# Patient Record
Sex: Male | Born: 1950 | Race: White | Hispanic: No | Marital: Married | Smoking: Never smoker
Health system: Southern US, Community
[De-identification: ages and names within clinical notes are randomized; demographics above are authoritative.]

## PROBLEM LIST (undated history)

## (undated) VITALS — BP 115/76 | HR 98 | Temp 98.1°F | Resp 16 | Ht 68.1 in | Wt 196.0 lb

## (undated) DIAGNOSIS — F329 Major depressive disorder, single episode, unspecified: Secondary | ICD-10-CM

## (undated) DIAGNOSIS — J329 Chronic sinusitis, unspecified: Secondary | ICD-10-CM

## (undated) DIAGNOSIS — C61 Malignant neoplasm of prostate: Secondary | ICD-10-CM

## (undated) DIAGNOSIS — Z87442 Personal history of urinary calculi: Secondary | ICD-10-CM

## (undated) DIAGNOSIS — R351 Nocturia: Secondary | ICD-10-CM

## (undated) DIAGNOSIS — M1990 Unspecified osteoarthritis, unspecified site: Secondary | ICD-10-CM

## (undated) DIAGNOSIS — I10 Essential (primary) hypertension: Secondary | ICD-10-CM

## (undated) DIAGNOSIS — Z860100 Personal history of colon polyps, unspecified: Secondary | ICD-10-CM

## (undated) DIAGNOSIS — K449 Diaphragmatic hernia without obstruction or gangrene: Secondary | ICD-10-CM

## (undated) DIAGNOSIS — K219 Gastro-esophageal reflux disease without esophagitis: Secondary | ICD-10-CM

## (undated) DIAGNOSIS — J84112 Idiopathic pulmonary fibrosis: Secondary | ICD-10-CM

## (undated) DIAGNOSIS — K649 Unspecified hemorrhoids: Secondary | ICD-10-CM

## (undated) DIAGNOSIS — R059 Cough, unspecified: Secondary | ICD-10-CM

## (undated) DIAGNOSIS — R3129 Other microscopic hematuria: Secondary | ICD-10-CM

## (undated) DIAGNOSIS — K5730 Diverticulosis of large intestine without perforation or abscess without bleeding: Secondary | ICD-10-CM

## (undated) DIAGNOSIS — F411 Generalized anxiety disorder: Secondary | ICD-10-CM

## (undated) DIAGNOSIS — R05 Cough: Secondary | ICD-10-CM

## (undated) DIAGNOSIS — F410 Panic disorder [episodic paroxysmal anxiety] without agoraphobia: Secondary | ICD-10-CM

## (undated) DIAGNOSIS — K5790 Diverticulosis of intestine, part unspecified, without perforation or abscess without bleeding: Secondary | ICD-10-CM

## (undated) HISTORY — DX: Essential (primary) hypertension: I10

## (undated) HISTORY — PX: COLONOSCOPY: SHX174

## (undated) HISTORY — DX: Other microscopic hematuria: R31.29

## (undated) HISTORY — PX: EXTRACORPOREAL SHOCK WAVE LITHOTRIPSY: SHX1557

## (undated) HISTORY — DX: Diaphragmatic hernia without obstruction or gangrene: K44.9

## (undated) HISTORY — DX: Idiopathic pulmonary fibrosis: J84.112

## (undated) HISTORY — PX: ESOPHAGOGASTRODUODENOSCOPY: SHX1529

## (undated) HISTORY — PX: PROSTATE BIOPSY: SHX241

## (undated) HISTORY — PX: TONSILLECTOMY AND ADENOIDECTOMY: SUR1326

## (undated) HISTORY — DX: Unspecified osteoarthritis, unspecified site: M19.90

## (undated) HISTORY — DX: Diverticulosis of intestine, part unspecified, without perforation or abscess without bleeding: K57.90

---

## 1997-12-22 ENCOUNTER — Emergency Department (HOSPITAL_COMMUNITY): Admission: EM | Admit: 1997-12-22 | Discharge: 1997-12-22 | Payer: Self-pay | Admitting: Emergency Medicine

## 1997-12-22 ENCOUNTER — Encounter: Payer: Self-pay | Admitting: Emergency Medicine

## 2000-07-02 ENCOUNTER — Ambulatory Visit (HOSPITAL_COMMUNITY): Admission: RE | Admit: 2000-07-02 | Discharge: 2000-07-02 | Payer: Self-pay | Admitting: Urology

## 2000-07-02 ENCOUNTER — Encounter: Payer: Self-pay | Admitting: Urology

## 2007-06-08 ENCOUNTER — Ambulatory Visit: Payer: Self-pay | Admitting: Internal Medicine

## 2007-06-18 ENCOUNTER — Ambulatory Visit: Payer: Self-pay | Admitting: Internal Medicine

## 2007-06-18 ENCOUNTER — Encounter: Payer: Self-pay | Admitting: Internal Medicine

## 2011-01-10 ENCOUNTER — Other Ambulatory Visit (HOSPITAL_COMMUNITY): Payer: Self-pay | Admitting: Urology

## 2011-01-10 DIAGNOSIS — R52 Pain, unspecified: Secondary | ICD-10-CM

## 2011-01-24 ENCOUNTER — Other Ambulatory Visit (HOSPITAL_COMMUNITY): Payer: Self-pay | Admitting: Urology

## 2011-01-24 ENCOUNTER — Ambulatory Visit (HOSPITAL_COMMUNITY)
Admission: RE | Admit: 2011-01-24 | Discharge: 2011-01-24 | Disposition: A | Discharge status: Home / Self Care | Payer: BC Managed Care – PPO | Source: Ambulatory Visit | Attending: Urology | Admitting: Urology

## 2011-01-24 DIAGNOSIS — N50812 Left testicular pain: Secondary | ICD-10-CM

## 2011-01-24 DIAGNOSIS — R109 Unspecified abdominal pain: Secondary | ICD-10-CM | POA: Insufficient documentation

## 2011-01-24 DIAGNOSIS — I861 Scrotal varices: Secondary | ICD-10-CM | POA: Insufficient documentation

## 2011-01-24 DIAGNOSIS — R52 Pain, unspecified: Secondary | ICD-10-CM

## 2011-02-27 ENCOUNTER — Other Ambulatory Visit: Payer: Self-pay | Admitting: Otolaryngology

## 2011-02-28 ENCOUNTER — Ambulatory Visit
Admission: RE | Admit: 2011-02-28 | Discharge: 2011-02-28 | Disposition: A | Discharge status: Home / Self Care | Payer: BC Managed Care – PPO | Source: Ambulatory Visit | Attending: Otolaryngology | Admitting: Otolaryngology

## 2011-03-07 ENCOUNTER — Other Ambulatory Visit: Payer: Self-pay | Admitting: Otolaryngology

## 2011-03-07 DIAGNOSIS — H905 Unspecified sensorineural hearing loss: Secondary | ICD-10-CM

## 2011-03-07 DIAGNOSIS — H9312 Tinnitus, left ear: Secondary | ICD-10-CM

## 2011-03-08 ENCOUNTER — Inpatient Hospital Stay: Admission: RE | Admit: 2011-03-08 | Payer: BC Managed Care – PPO | Source: Ambulatory Visit

## 2011-03-09 ENCOUNTER — Ambulatory Visit
Admission: RE | Admit: 2011-03-09 | Discharge: 2011-03-09 | Disposition: A | Discharge status: Home / Self Care | Payer: BC Managed Care – PPO | Source: Ambulatory Visit | Attending: Otolaryngology | Admitting: Otolaryngology

## 2011-03-09 DIAGNOSIS — H905 Unspecified sensorineural hearing loss: Secondary | ICD-10-CM

## 2011-03-09 DIAGNOSIS — H9312 Tinnitus, left ear: Secondary | ICD-10-CM

## 2011-03-09 MED ORDER — GADOBENATE DIMEGLUMINE 529 MG/ML IV SOLN
17.0000 mL | Freq: Once | INTRAVENOUS | Status: AC | PRN
  Administered 2011-03-09: 17 mL via INTRAVENOUS

## 2011-04-20 ENCOUNTER — Encounter (HOSPITAL_BASED_OUTPATIENT_CLINIC_OR_DEPARTMENT_OTHER): Payer: Self-pay | Admitting: Emergency Medicine

## 2011-04-20 ENCOUNTER — Emergency Department (HOSPITAL_BASED_OUTPATIENT_CLINIC_OR_DEPARTMENT_OTHER)
Admission: EM | Admit: 2011-04-20 | Discharge: 2011-04-20 | Disposition: A | Discharge status: Home / Self Care | Payer: BC Managed Care – PPO | Attending: Emergency Medicine | Admitting: Emergency Medicine

## 2011-04-20 DIAGNOSIS — J329 Chronic sinusitis, unspecified: Secondary | ICD-10-CM

## 2011-04-20 DIAGNOSIS — J3489 Other specified disorders of nose and nasal sinuses: Secondary | ICD-10-CM | POA: Insufficient documentation

## 2011-04-20 MED ORDER — FLUTICASONE PROPIONATE 50 MCG/ACT NA SUSP: 2.0000 | Freq: Every day | NASAL | Status: DC

## 2011-04-20 NOTE — ED Provider Notes (Signed)
History     CSN: [id number]  Arrival date & time 04/20/11  1243   First MD Initiated Contact with Patient 04/20/11 1356      Chief Complaint  Patient presents with  . Nasal Congestion    (Consider location/radiation/quality/duration/timing/severity/associated sxs/prior treatment) HPI Comments: Pt states that he has been on 6 different antibiotics from sinusitis by his ent over the last couple of weeks  Patient is a 61 y.o. male presenting with sinusitis. The history is provided by the patient. No language interpreter was used.  Sinusitis  This is a recurrent problem. The current episode started more than 1 week ago. The problem has not changed since onset.There has been no fever. The pain is moderate. The pain has been constant since onset. Associated symptoms include congestion and cough.    Past Medical History  Diagnosis Date  . Tinnitus   . Depression   . Anxiety     History reviewed. No pertinent past surgical history.  No family history on file.  History  Substance Use Topics  . Smoking status: Never Smoker   . Smokeless tobacco: Not on file  . Alcohol Use: No      Review of Systems  HENT: Positive for congestion.   Respiratory: Positive for cough.   All other systems reviewed and are negative.    Allergies  Review of patient's allergies indicates no known allergies.  Home Medications   Current Outpatient Rx  Name Route Sig Dispense Refill  . ALPRAZOLAM 0.5 MG PO TABS Oral Take 0.5 mg by mouth 2 (two) times daily.    Marland Kitchen DOXYCYCLINE HYCLATE PO Oral Take by mouth.    Marland Kitchen HYDROCHLOROTHIAZIDE PO Oral Take by mouth.    Marland Kitchen LOSARTAN POTASSIUM PO Oral Take by mouth.    Marland Kitchen PAXIL PO Oral Take by mouth.    Marland Kitchen PROBIOTIC FORMULA PO Oral Take by mouth.    Marland Kitchen FLUTICASONE PROPIONATE 50 MCG/ACT NA SUSP Nasal Place 2 sprays into the nose daily. 16 g 0    BP 108/72  Pulse 86  Temp(Src) 98 F (36.7 C) (Oral)  Resp 20  Ht 5\' 9"  (1.753 m)  Wt 190 lb (86.183 kg)  BMI  28.06 kg/m2  SpO2 100%  Physical Exam  Nursing note and vitals reviewed. Constitutional: He is oriented to person, place, and time. He appears well-developed and well-nourished.  HENT:  Head: Normocephalic and atraumatic.  Right Ear: External ear normal.  Left Ear: External ear normal.  Nose: Mucosal edema and rhinorrhea present.  Eyes: EOM are normal.  Neck: Neck supple.  Cardiovascular: Normal rate and regular rhythm.   Pulmonary/Chest: Effort normal and breath sounds normal.  Abdominal: Soft. Bowel sounds are normal.  Musculoskeletal: Normal range of motion.  Neurological: He is alert and oriented to person, place, and time.  Skin: Skin is warm and dry.    ED Course  Procedures (including critical care time)  Labs Reviewed - No data to display No results found.   1. Sinusitis       MDM  Pt okay to continue doxy;will place on flonase        Teressa Lower, NP 04/21/11 0036

## 2011-04-20 NOTE — ED Notes (Signed)
Pt sts he has taken "5-6 different antibiotics" over the last 2 mos for sinus infections; sees ENT for same; not getting better

## 2011-04-20 NOTE — ED Notes (Signed)
MD at bedside. Vrinda, PA 

## 2011-04-20 NOTE — Discharge Instructions (Signed)

## 2011-04-21 NOTE — ED Provider Notes (Signed)
Medical screening examination/treatment/procedure(s) were performed by non-physician practitioner and as supervising physician I was immediately available for consultation/collaboration.  Ethelda Chick, MD 04/21/11 1125

## 2011-05-09 ENCOUNTER — Encounter: Payer: Self-pay | Admitting: Nurse Practitioner

## 2011-05-09 ENCOUNTER — Telehealth: Payer: Self-pay | Admitting: Internal Medicine

## 2011-05-09 ENCOUNTER — Other Ambulatory Visit (INDEPENDENT_AMBULATORY_CARE_PROVIDER_SITE_OTHER): Payer: BC Managed Care – PPO

## 2011-05-09 ENCOUNTER — Ambulatory Visit (INDEPENDENT_AMBULATORY_CARE_PROVIDER_SITE_OTHER): Payer: BC Managed Care – PPO | Admitting: Nurse Practitioner

## 2011-05-09 VITALS — BP 86/60 | HR 98 | Ht 69.0 in | Wt 191.8 lb

## 2011-05-09 DIAGNOSIS — R12 Heartburn: Secondary | ICD-10-CM

## 2011-05-09 DIAGNOSIS — R42 Dizziness and giddiness: Secondary | ICD-10-CM

## 2011-05-09 DIAGNOSIS — R110 Nausea: Secondary | ICD-10-CM

## 2011-05-09 DIAGNOSIS — F411 Generalized anxiety disorder: Secondary | ICD-10-CM

## 2011-05-09 DIAGNOSIS — F419 Anxiety disorder, unspecified: Secondary | ICD-10-CM

## 2011-05-09 LAB — COMPREHENSIVE METABOLIC PANEL
AST: 22 U/L (ref 0–37)
Albumin: 4 g/dL (ref 3.5–5.2)
BUN: 16 mg/dL (ref 6–23)
Calcium: 9.4 mg/dL (ref 8.4–10.5)
Chloride: 96 mEq/L (ref 96–112)
Creatinine, Ser: 1.3 mg/dL (ref 0.4–1.5)
GFR: 61.75 mL/min (ref >60.00)
Glucose, Bld: 90 mg/dL (ref 70–99)
Potassium: 4.1 mEq/L (ref 3.5–5.1)

## 2011-05-09 LAB — CBC WITH DIFFERENTIAL/PLATELET
Basophils Relative: 0.7 % (ref 0.0–3.0)
Eosinophils Absolute: 0.1 10*3/uL (ref 0.0–0.7)
Eosinophils Relative: 0.9 % (ref 0.0–5.0)
Hemoglobin: 17.2 g/dL — ABNORMAL HIGH (ref 13.0–17.0)
Lymphocytes Relative: 22.7 % (ref 12.0–46.0)
MCHC: 34.5 g/dL (ref 30.0–36.0)
MCV: 96 fl (ref 78.0–100.0)
Neutro Abs: 4.9 10*3/uL (ref 1.4–7.7)
RBC: 5.19 Mil/uL (ref 4.22–5.81)
WBC: 7.2 10*3/uL (ref 4.5–10.5)

## 2011-05-09 MED ORDER — ESOMEPRAZOLE MAGNESIUM 40 MG PO CPDR: DELAYED_RELEASE_CAPSULE | ORAL | Status: DC

## 2011-05-09 NOTE — Progress Notes (Signed)
05/09/2011 Eduardo Fuentes [id number] 09/19/1950   HISTORY OF PRESENT ILLNESS: Patient had a screening colonoscopy in April 2009 with removal of an adenomatous polyp. He presents for evaluation of nausea which began about a month ago. The nausea is progressive and he is now having dry heaves. He doesn't actually vomit, the nausea is not just related to eating as he often wakes up with it. Patient takes Ibuprofen about 3 times a week. This week patient has had some burning in his chest which he thinks may be heartburn. He also complains of acid regurgitiation. No dysphagia. No significant involuntary weight loss.   Patient has been in process of changing anti-depressants. He was on Paxil for years but that was changed to Lexapro in early Feb. Lexapro was ineffective and caused headaches so he went back to Paxil. The dose was increased from 20mg  to 60mg  two weeks ago but patient wasn't feeling "right" so dose decreased dose to 40mg . He still feels depressed and anxious. In addition, patient was diagnosed with tinnitus in October. Since December ENT has been treating him with antibiotics for a sinus infection. He has been on 6-7 different antibiotics.  Patient saw ENT today,  told sinus infection was better, and antibiotics discontinued.   Patient complains of dizziness this week, mainly when rising from sitting position.   Past Medical History  Diagnosis Date  . Tinnitus   . Depression   . Anxiety    No past surgical history on file.  reports that he has never smoked. He has never used smokeless tobacco. He reports that he does not drink alcohol or use illicit drugs. family history includes Bone cancer in his father; Dementia in his mother; and Heart disease in his father. No Known Allergies    Outpatient Encounter Prescriptions as of 05/09/2011  Medication Sig Dispense Refill  . ALPRAZolam (XANAX) 0.5 MG tablet Take 0.5 mg by mouth 2 (two) times daily.      . fluticasone (FLONASE) 50 MCG/ACT  nasal spray Place 2 sprays into the nose daily.  16 g  0  . HYDROCHLOROTHIAZIDE PO Take by mouth.      Marland Kitchen LOSARTAN POTASSIUM PO Take by mouth.      Marland Kitchen PARoxetine HCl (PAXIL PO) Take by mouth.      . DOXYCYCLINE HYCLATE PO Take by mouth.      . Probiotic Product (PROBIOTIC FORMULA PO) Take by mouth.         REVIEW OF SYSTEMS  : All other systems reviewed and negative except where noted in the History of Present Illness.   PHYSICAL EXAM: BP 94/68  Pulse 89  Ht 5\' 9"  (1.753 m)  Wt 191 lb 12.8 oz (87 kg)  BMI 28.32 kg/m2  SpO2 97% General: Well developed white male in no acute distress Head: Normocephalic and atraumatic Eyes:  sclerae anicteric,conjunctive pink. Ears: Normal auditory acuity Mouth: No deformity or lesions Neck: Supple, no masses.  Lungs: Clear throughout to auscultation Heart: Regular rate and rhythm Abdomen: Soft, non distended, nontender. No masses or hepatomegaly noted. Normal Bowel sounds Rectal: not done Musculoskeletal: Symmetrical with no gross deformities  Skin: No lesions on visible extremities Extremities: No edema or deformities noted Neurological: Alert oriented, grossly nonfocal Cervical Nodes:  No significant cervical adenopathy Psychological:  Alert and cooperative, anxious.  ASSESSMENT AND PLAN; 1. Nausea, possibly secondary to changes in anti-depressant types / dosages and maybe antibiotics. PUD not excluded given NSAID use. Hold Ibuprofen for now. Trial of PPI. Will check  some basic labs today. Follow up in approximately three weeks. Further recommendations pending clinical course. May use the Phenergan (home med) for nausea as needed.    2. HTN, on medication. SBP lower than normal today per patient. His is having some dizziness this week, mainly when standing up. He is mildly orthostatic in the office.  Patient inquires about adding more salt in diet, I think it would be best to just hold his HCTZ for now and I did call PCP's office regarding this.  Advised patient to increase fluid intake and call PCP on Monday.    3. Anxiety / depression, his anti-depressants are currently being adjusted. Patient is quite anxious today  4. Probable GERD with heartburn, acid regurgitation. Will see how he does on daily PPI. Anti-reflux measures discussed.

## 2011-05-09 NOTE — Telephone Encounter (Signed)
Pt states that he is having nausea and gagging in the mornings, he does not throw anything up. Pt wonders if it may be acid reflux, he states he has been on a lot of antibiotics recently. Pt scheduled to see Willette Cluster NP today at 3:30pm. Pt aware of appt date and time.

## 2011-05-09 NOTE — Patient Instructions (Signed)
We have given you samples of Nexium. Take the Phenergan for nausea. Do not take the Hydrochlorothiazide for now.

## 2011-05-10 ENCOUNTER — Encounter: Payer: Self-pay | Admitting: Nurse Practitioner

## 2011-05-12 NOTE — Progress Notes (Signed)
reviewed and agree. If sx's continue, consider EGD

## 2011-05-20 ENCOUNTER — Telehealth: Payer: Self-pay | Admitting: Internal Medicine

## 2011-05-20 DIAGNOSIS — R110 Nausea: Secondary | ICD-10-CM

## 2011-05-20 NOTE — Telephone Encounter (Signed)
Discussed pt with Willette Cluster NP and Dr. Marina Goodell. Pt scheduled for previsit 05/21/11@4pm , EGD scheduled for 05/22/11@11 :30am. Pt aware of appt dates and times.

## 2011-05-20 NOTE — Telephone Encounter (Signed)
Pt saw Willette Cluster NP 05/09/11 for nausea and gagging. Pt states he is still having this problem, he states the Nexium he was given has not helped. Pt states he has the nausea and gagging in the am when he first wakes up, not in the evening. Dr. Marina Goodell please advise.

## 2011-05-20 NOTE — Telephone Encounter (Signed)
I'm not familiar enough to advise. Talk to Gunnar Fusi, see if she needs to see him again

## 2011-05-21 ENCOUNTER — Ambulatory Visit (AMBULATORY_SURGERY_CENTER): Payer: BC Managed Care – PPO

## 2011-05-21 VITALS — Ht 69.0 in | Wt 190.0 lb

## 2011-05-21 DIAGNOSIS — R110 Nausea: Secondary | ICD-10-CM

## 2011-05-22 ENCOUNTER — Ambulatory Visit (AMBULATORY_SURGERY_CENTER): Payer: BC Managed Care – PPO | Admitting: Internal Medicine

## 2011-05-22 ENCOUNTER — Encounter: Payer: Self-pay | Admitting: Internal Medicine

## 2011-05-22 DIAGNOSIS — R112 Nausea with vomiting, unspecified: Secondary | ICD-10-CM

## 2011-05-22 DIAGNOSIS — R110 Nausea: Secondary | ICD-10-CM

## 2011-05-22 MED ORDER — SODIUM CHLORIDE 0.9 % IV SOLN: 500.0000 mL | INTRAVENOUS | Status: DC

## 2011-05-22 MED ORDER — ONDANSETRON HCL 4 MG PO TABS: 4.0000 mg | ORAL_TABLET | ORAL | Status: AC | PRN

## 2011-05-22 NOTE — Op Note (Signed)
[facility] [address] [place], [place]  [address]  ENDOSCOPY PROCEDURE REPORT  PATIENT:  Eduardo, Fuentes  MR#:  [id number] BIRTHDATE:  Dec 13, 1950, 60 yrs. old  GENDER:  male  ENDOSCOPIST:  Wilhemina Bonito. Eda Keys, MD Referred by:  Office  PROCEDURE DATE:  05/22/2011 PROCEDURE:  EGD, diagnostic 19147 ASA CLASS:  Class II INDICATIONS:  nausea and vomiting  MEDICATIONS:   MAC sedation, administered by CRNA, propofol (Diprivan) 120 mg IV TOPICAL ANESTHETIC:  none  DESCRIPTION OF PROCEDURE:   After the risks benefits and alternatives of the procedure were thoroughly explained, informed consent was obtained.  The LB GIF-H180 [id number] endoscope was introduced through the mouth and advanced to the second portion of the duodenum, without limitations.  The instrument was slowly withdrawn as the mucosa was fully examined. <<PROCEDUREIMAGES>>  The upper, middle, and distal third of the esophagus were carefully inspected and no abnormalities were noted. The z-line was well seen at the GEJ. The endoscope was pushed into the fundus which was normal including a retroflexed view. The antrum,gastric body, first and second part of the duodenum were unremarkable. Retroflexed views revealed no abnormalities.    The scope was then withdrawn from the patient and the procedure completed.  COMPLICATIONS:  None  ENDOSCOPIC IMPRESSION: 1) Normal EGD 2) No cause of nausea and vomiting on EGD. Suspect anxiety related  RECOMMENDATIONS: 1) Prilosec OTC at night (trouble taking in am due to N/V) 2) Zofran 4mg  q 4 hrs prn nausea; # 60; 1 refill 3) WORK WITH YOUR PSYCHIATRIST TO RESOLVE ANXIETY, AS THIS WILL LIKELY IMPROVE THE NAUSEA  ______________________________ Wilhemina Bonito. Eda Keys, MD  CC:  The Patient  n. eSIGNED:   Wilhemina Bonito. Eda Keys at 05/22/2011 12:47 PM  Phoebe Perch, [phone]

## 2011-05-22 NOTE — Progress Notes (Signed)
Patient did not experience any of the following events: a burn prior to discharge; a fall within the facility; wrong site/side/patient/procedure/implant event; or a hospital transfer or hospital admission upon discharge from the facility. (G8907) Patient did not have preoperative order for IV antibiotic SSI prophylaxis. (G8918)  

## 2011-05-22 NOTE — Patient Instructions (Addendum)
YOU HAD AN ENDOSCOPIC PROCEDURE TODAY AT THE [facility]: Refer to the procedure report that was given to you for any specific questions about what was found during the examination.  If the procedure report does not answer your questions, please call your gastroenterologist to clarify.  If you requested that your care partner not be given the details of your procedure findings, then the procedure report has been included in a sealed envelope for you to review at your convenience later.  YOU SHOULD EXPECT: Some feelings of bloating in the abdomen. Passage of more gas than usual.  Walking can help get rid of the air that was put into your GI tract during the procedure and reduce the bloating. If you had a lower endoscopy (such as a colonoscopy or flexible sigmoidoscopy) you may notice spotting of blood in your stool or on the toilet paper. If you underwent a bowel prep for your procedure, then you may not have a normal bowel movement for a few days.  DIET: Your first meal following the procedure should be a light meal and then it is ok to progress to your normal diet.  A half-sandwich or bowl of soup is an example of a good first meal.  Heavy or fried foods are harder to digest and may make you feel nauseous or bloated.  Likewise meals heavy in dairy and vegetables can cause extra gas to form and this can also increase the bloating.  Drink plenty of fluids but you should avoid alcoholic beverages for 24 hours.  ACTIVITY: Your care partner should take you home directly after the procedure.  You should plan to take it easy, moving slowly for the rest of the day.  You can resume normal activity the day after the procedure however you should NOT DRIVE or use heavy machinery for 24 hours (because of the sedation medicines used during the test).    SYMPTOMS TO REPORT IMMEDIATELY: A gastroenterologist can be reached at any hour.  During normal business hours, 8:30 AM to 5:00 PM Monday through Friday,  call [phone].  After hours and on weekends, please call the GI answering service at [phone] who will take a message and have the physician on call contact you.    Following upper endoscopy (EGD)  Vomiting of blood or coffee ground material  New chest pain or pain under the shoulder blades  Painful or persistently difficult swallowing  New shortness of breath  Fever of 100F or higher  Black, tarry-looking stools  FOLLOW UP: If any biopsies were taken you will be contacted by phone or by letter within the next 1-3 weeks.  Call your gastroenterologist if you have not heard about the biopsies in 3 weeks.  Our staff will call the home number listed on your records the next business day following your procedure to check on you and address any questions or concerns that you may have at that time regarding the information given to you following your procedure. This is a courtesy call and so if there is no answer at the home number and we have not heard from you through the emergency physician on call, we will assume that you have returned to your regular daily activities without incident.  SIGNATURES/CONFIDENTIALITY:  Resume medications. You and/or your care partner have signed paperwork which will be entered into your electronic medical record.  These signatures attest to the fact that that the information above on your After Visit Summary has been reviewed and is  understood.  Full responsibility of the confidentiality of this discharge information lies with you and/or your care-partner.   Resume medications.

## 2011-05-26 ENCOUNTER — Telehealth: Payer: Self-pay | Admitting: *Deleted

## 2011-05-26 NOTE — Telephone Encounter (Signed)
  Follow up Call-  Call back number 05/22/2011  Post procedure Call Back phone  # [phone] hm  Permission to leave phone message Yes     Patient questions:  Do you have a fever, pain , or abdominal swelling? no Pain Score  0 *  Have you tolerated food without any problems? yes  Have you been able to return to your normal activities? yes  Do you have any questions about your discharge instructions: Diet   no Medications  no Follow up visit  no  Do you have questions or concerns about your Care? no  Actions: * If pain score is 4 or above: No action needed, pain <4.

## 2011-06-03 ENCOUNTER — Emergency Department (HOSPITAL_COMMUNITY): Payer: BC Managed Care – PPO

## 2011-06-03 ENCOUNTER — Encounter (HOSPITAL_COMMUNITY): Payer: Self-pay

## 2011-06-03 ENCOUNTER — Emergency Department (HOSPITAL_COMMUNITY)
Admission: EM | Admit: 2011-06-03 | Discharge: 2011-06-03 | Disposition: A | Discharge status: Home / Self Care | Payer: BC Managed Care – PPO | Attending: Emergency Medicine | Admitting: Emergency Medicine

## 2011-06-03 DIAGNOSIS — H9319 Tinnitus, unspecified ear: Secondary | ICD-10-CM | POA: Insufficient documentation

## 2011-06-03 DIAGNOSIS — R51 Headache: Secondary | ICD-10-CM | POA: Insufficient documentation

## 2011-06-03 DIAGNOSIS — Z046 Encounter for general psychiatric examination, requested by authority: Secondary | ICD-10-CM | POA: Insufficient documentation

## 2011-06-03 DIAGNOSIS — R42 Dizziness and giddiness: Secondary | ICD-10-CM | POA: Insufficient documentation

## 2011-06-03 DIAGNOSIS — F411 Generalized anxiety disorder: Secondary | ICD-10-CM

## 2011-06-03 DIAGNOSIS — F419 Anxiety disorder, unspecified: Secondary | ICD-10-CM

## 2011-06-03 LAB — COMPREHENSIVE METABOLIC PANEL
ALT: 21 U/L (ref 0–53)
AST: 21 U/L (ref 0–37)
Alkaline Phosphatase: 102 U/L (ref 39–117)
CO2: 31 mEq/L (ref 19–32)
Calcium: 9.9 mg/dL (ref 8.4–10.5)
Chloride: 97 mEq/L (ref 96–112)
GFR calc Af Amer: 89 mL/min — ABNORMAL LOW (ref >90)
GFR calc non Af Amer: 76 mL/min — ABNORMAL LOW (ref >90)
Glucose, Bld: 122 mg/dL — ABNORMAL HIGH (ref 70–99)
Potassium: 4.1 mEq/L (ref 3.5–5.1)
Sodium: 135 mEq/L (ref 135–145)
Total Bilirubin: 0.9 mg/dL (ref 0.3–1.2)

## 2011-06-03 LAB — RAPID URINE DRUG SCREEN, HOSP PERFORMED
Amphetamines: NOT DETECTED
Barbiturates: NOT DETECTED
Benzodiazepines: POSITIVE — AB
Cocaine: NOT DETECTED
Opiates: NOT DETECTED
Tetrahydrocannabinol: NOT DETECTED

## 2011-06-03 LAB — CBC
HCT: 46.9 % (ref 39.0–52.0)
Hemoglobin: 17 g/dL (ref 13.0–17.0)
MCH: 33.2 pg (ref 26.0–34.0)
MCHC: 36.2 g/dL — ABNORMAL HIGH (ref 30.0–36.0)
MCV: 91.6 fL (ref 78.0–100.0)
Platelets: 290 10*3/uL (ref 150–400)
RBC: 5.12 MIL/uL (ref 4.22–5.81)
RDW: 12.6 % (ref 11.5–15.5)
WBC: 4.5 10*3/uL (ref 4.0–10.5)

## 2011-06-03 LAB — COMPREHENSIVE METABOLIC PANEL WITH GFR
Albumin: 4 g/dL (ref 3.5–5.2)
BUN: 11 mg/dL (ref 6–23)
Creatinine, Ser: 1.03 mg/dL (ref 0.50–1.35)
Total Protein: 7.1 g/dL (ref 6.0–8.3)

## 2011-06-03 LAB — ETHANOL: Alcohol, Ethyl (B): <11 mg/dL (ref 0–11)

## 2011-06-03 MED ORDER — ACETAMINOPHEN 325 MG PO TABS: 650.0000 mg | ORAL_TABLET | ORAL | Status: DC | PRN

## 2011-06-03 MED ORDER — LOSARTAN POTASSIUM 50 MG PO TABS
50.0000 mg | ORAL_TABLET | Freq: Every day | ORAL | Status: DC
  Filled 2011-06-03: qty 1

## 2011-06-03 MED ORDER — ZOLPIDEM TARTRATE 5 MG PO TABS: 5.0000 mg | ORAL_TABLET | Freq: Every evening | ORAL | Status: DC | PRN

## 2011-06-03 MED ORDER — ALPRAZOLAM 0.5 MG PO TABS: 0.5000 mg | ORAL_TABLET | Freq: Four times a day (QID) | ORAL | Status: AC | PRN

## 2011-06-03 MED ORDER — PAROXETINE HCL 30 MG PO TABS
30.0000 mg | ORAL_TABLET | Freq: Every day | ORAL | Status: DC
  Filled 2011-06-03: qty 1

## 2011-06-03 MED ORDER — LORAZEPAM 1 MG PO TABS: 2.0000 mg | ORAL_TABLET | Freq: Once | ORAL | Status: DC

## 2011-06-03 MED ORDER — HYDROCHLOROTHIAZIDE 25 MG PO TABS
12.5000 mg | ORAL_TABLET | Freq: Every day | ORAL | Status: DC
  Filled 2011-06-03: qty 0.5

## 2011-06-03 MED ORDER — LORAZEPAM 1 MG PO TABS
1.0000 mg | ORAL_TABLET | Freq: Four times a day (QID) | ORAL | Status: DC | PRN
  Administered 2011-06-03: 1 mg via ORAL
  Filled 2011-06-03: qty 1

## 2011-06-03 MED ORDER — ONDANSETRON HCL 4 MG PO TABS: 4.0000 mg | ORAL_TABLET | Freq: Three times a day (TID) | ORAL | Status: DC | PRN

## 2011-06-03 MED ORDER — IBUPROFEN 600 MG PO TABS: 600.0000 mg | ORAL_TABLET | Freq: Three times a day (TID) | ORAL | Status: DC | PRN

## 2011-06-03 MED ORDER — VENLAFAXINE HCL ER 37.5 MG PO CP24
37.5000 mg | ORAL_CAPSULE | Freq: Every day | ORAL | Status: DC
  Filled 2011-06-03: qty 1

## 2011-06-03 NOTE — BH Assessment (Signed)
Assessment Note   Eduardo Fuentes is an 61 y.o. male. Patient presenting to [facility] with increased depression and anxiety. He also has  occasional suicidal thoughts. Denies current thoughts and/or plan. He has no hx of previous attempts or gestures. Sts that his thoughts are more intense in the morning. He has a diagnosis of Tinnitus. The symptoms from his Tinnitus has worsened in the last week. Patient sts, "I hate the ringing in my ears". He is under the care of a doctor for his medical diagnosis but sts he isn't getting any better. Patient speaks of his anxiety symptoms. Reports frequent/intense panic attacks in the past week. He also speaks of increased depression with related vegetative symptoms. Spouse at bedside stating that he "stays in the bed all day locked away in his room". Patient admits to staying in the bed but also reports decreased bathing and grooming. Patient also ended his employment due to the above symptoms 2 months ago.  He denies SI, HI, and AVH's. Denies alcohol and drug use. No prior history of in-pt hospitalizations. However, he is under the care of Dr. Barnett Abu for medication management.   Discussed referral options with patient and spouse. They both discussed options provided and decided that Psych IOP would be the best option for them at this time. Writer also discussed treatment plan with Dr. Carmelina Dane and he also evaluated patient. Dr. Carmelina Dane agreed with plan.   Patient to start Psych IOP 06/04/2011.   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 Tinnitus   . Depression   . Anxiety   . Diverticulosis   . Colon polyps   . Tinnitus    Axis IV: occupational problems and other psychosocial or environmental problems Axis V: 51-60 moderate symptoms  Past Medical History:  Past Medical History  Diagnosis Date  . Tinnitus   . Depression   . Anxiety   .  Diverticulosis   . Colon polyps   . Tinnitus     Past Surgical History  Procedure Date  . Lithotripsy   . Tonsillectomy and adenoidectomy     Family History:  Family History  Problem Relation Age of Onset  . Dementia Mother   . Heart disease Father   . Bone cancer Father   . Colon cancer Neg Hx   . Esophageal cancer Neg Hx   . Rectal cancer Neg Hx   . Stomach cancer Neg Hx     Social History:  reports that he has never smoked. He has never used smokeless tobacco. He reports that he does not drink alcohol or use illicit drugs.  Additional Social History:  Alcohol / Drug Use Pain Medications: patient denies pain medications Prescriptions: see listed meds noted by ED staff Over the Counter: on OTC meds noted History of alcohol / drug use?: No history of alcohol / drug abuse Longest period of sobriety (when/how long): n/a Allergies: No Known Allergies  Home Medications:  Medications Prior to Admission  Medication Dose Route Frequency Provider Last Rate Last Dose  . acetaminophen (TYLENOL) tablet 650 mg  650 mg Oral Q4H PRN Tatyana A Kirichenko, PA      . hydrochlorothiazide (HYDRODIURIL) tablet 12.5 mg  12.5 mg Oral Daily Tatyana A Kirichenko, PA      . ibuprofen (ADVIL,MOTRIN) tablet 600 mg  600 mg Oral Q8H PRN Tatyana A Kirichenko, PA      . LORazepam (ATIVAN) tablet  1 mg  1 mg Oral Q6H PRN Tatyana A Kirichenko, PA   1 mg at 06/03/11 1550  . LORazepam (ATIVAN) tablet 2 mg  2 mg Oral Once Tatyana A Kirichenko, PA      . losartan (COZAAR) tablet 50 mg  50 mg Oral Daily Tatyana A Kirichenko, PA      . ondansetron (ZOFRAN) tablet 4 mg  4 mg Oral Q8H PRN Tatyana A Kirichenko, PA      . PARoxetine (PAXIL) tablet 30 mg  30 mg Oral Daily Tatyana A Kirichenko, PA      . venlafaxine (EFFEXOR-XR) 24 hr capsule 37.5 mg  37.5 mg Oral Q breakfast Tatyana A Kirichenko, PA      . zolpidem (AMBIEN) tablet 5 mg  5 mg Oral QHS PRN Tatyana A Kirichenko, PA       Medications Prior to  Admission  Medication Sig Dispense Refill  . ALPRAZolam (XANAX) 0.5 MG tablet Take 0.5 mg by mouth 3 (three) times daily as needed. Anxiety      . desvenlafaxine (PRISTIQ) 50 MG 24 hr tablet Take 50 mg by mouth daily.      . fluticasone (FLONASE) 50 MCG/ACT nasal spray Place 2 sprays into the nose daily.  16 g  0    OB/GYN Status:  No LMP for male patient.  General Assessment Data Location of Assessment: [facility] ED ACT Assessment: Yes Living Arrangements: Spouse/significant other Can pt return to current living arrangement?: Yes Admission Status: Voluntary Is patient capable of signing voluntary admission?: Yes Transfer from: Home Referral Source: Self/Family/Friend  Education Status Is patient currently in school?: No  Risk to self Suicidal Ideation: No Suicidal Intent: No Is patient at risk for suicide?: No Suicidal Plan?: No Access to Means: No What has been your use of drugs/alcohol within the last 12 months?:  (no alcohol or drug use noted) Previous Attempts/Gestures: No How many times?:  (0) Other Self Harm Risks:  (n/a) Triggers for Past Attempts:  (no previous attempts or gestures) Intentional Self Injurious Behavior: None Family Suicide History: No Recent stressful life event(s):  (stop working 2 months ago; Tinnitus-"ringing of the ears") Persecutory voices/beliefs?: No Depression: No Depression Symptoms: Feeling worthless/self pity;Loss of interest in usual pleasures;Fatigue;Isolating;Insomnia;Despondent Substance abuse history and/or treatment for substance abuse?: No Suicide prevention information given to non-admitted patients: Not applicable  Risk to Others Homicidal Ideation: No Thoughts of Harm to Others: No Current Homicidal Intent: No Current Homicidal Plan: No Access to Homicidal Means: No Identified Victim:  (n/a) History of harm to others?: No Assessment of Violence: None Noted Violent Behavior Description:  (Patient calm and cooperative during his  stay in the ED) Does patient have access to weapons?: No Criminal Charges Pending?: No Does patient have a court date: No  Psychosis Hallucinations: None noted Delusions: None noted  Mental Status Report Appear/Hygiene: Improved Eye Contact: Good Motor Activity: Restlessness;Freedom of movement Speech: Logical/coherent Level of Consciousness: Alert Mood: Anxious;Depressed Affect: Appropriate to circumstance Anxiety Level: Severe Thought Processes: Coherent Judgement: Unimpaired Orientation: Person;Time;Situation;Place;Appropriate for developmental age Obsessive Compulsive Thoughts/Behaviors: None  Cognitive Functioning Concentration: Decreased Memory: Recent Intact;Remote Intact IQ: Average Insight: Good Impulse Control: Good Appetite: Good Weight Loss:  (n/a) Weight Gain:  (n/a) Sleep: Decreased Total Hours of Sleep:  (5-6 hours per night; difficulty falling asleep due to tinnit)  Prior Inpatient Therapy Prior Inpatient Therapy: No Prior Therapy Dates:  (n/a) Prior Therapy Facilty/Provider(s):  (n/a) Reason for Treatment:  (n/a)  Prior Outpatient Therapy Prior Outpatient Therapy:  Yes Prior Therapy Dates:  (current) Prior Therapy Facilty/Provider(s):  (Dr. Barnett Abu) Reason for Treatment:  (depression, anxiety, medication mangement)  ADL Screening (condition at time of admission) Patient's cognitive ability adequate to safely complete daily activities?: Yes Patient able to express need for assistance with ADLs?: Yes Independently performs ADLs?: Yes Weakness of Legs: None Weakness of Arms/Hands: None  Home Assistive Devices/Equipment Home Assistive Devices/Equipment: None    Abuse/Neglect Assessment (Assessment to be complete while patient is alone) Physical Abuse: Denies Verbal Abuse: Denies Sexual Abuse: Denies Exploitation of patient/patient's resources: Denies Self-Neglect: Denies Values / Beliefs Cultural Requests During Hospitalization:  None Spiritual Requests During Hospitalization: None   Advance Directives (For Healthcare) Advance Directive: Patient does not have advance directive Nutrition Screen Diet: NPO;Regular  Additional Information 1:1 In Past 12 Months?: No CIRT Risk: No Elopement Risk: No Does patient have medical clearance?: No     Disposition:  Disposition Disposition of Patient: Other dispositions;Referred to;Outpatient treatment (CD-IOP) Type of outpatient treatment: Psych Intensive Outpatient Other disposition(s):  (Pt to start Psych IOP 06/04/2011) Patient referred to:  [facility] Psych IOP)  On Site Evaluation by:   Reviewed with Physician:     Melynda Ripple [facility] 06/03/2011 5:45 PM

## 2011-06-03 NOTE — ED Notes (Signed)
Pt in from home with stress and anxiety states thoughts of s/i pt states ongoing x1 week pt currently seeing dr Evelene Croon for tx

## 2011-06-03 NOTE — Consult Note (Signed)
Reason for Consult:Anxiety due to tinnitis Referring Physician: Dr. Zada [name] is an 61 y.o. Fuentes.  HPI: Eduardo Fuentes, married came with his wife for chief complaints of being anxious, difficulty falling sleep and worried all the time. Patient reported Eduardo Fuentes was taken 3 or 4 pills of his Xanax this morning to control his anxiety and then worried about getting addicted. Patient wife reported that his been staying in his room and not showing interest in her usual activities. Patient reported Eduardo Fuentes used to work [employer] but taken break about two months ago due to increased anxiety. Patient was diagnosed with tinntus,  which is causing increased anxiety. Patient has a partial loss of for healing on his left ear and unable to wear his hearing aid. Eduardo Fuentes has a plan for examined by healing doctors. Eduardo Fuentes has been seeing a psychiatrist for the last 5 years and taking medication Paxil and Xanax. Eduardo Fuentes said her psychiatrist is planning to taper off Paxil and start the new medication for controlling his anxiety. Patient has served no symptoms of for suicide or homicide or psychosis.  Patient appeared as per stated age casually dressed fairly groomed and maintained good eye contact Eduardo Fuentes was floridly anxious and worried. Eduardo Fuentes has no suicidal or homicidal ideation intentions or plans. Eduardo Fuentes has no evidence of psychotic symptoms.   Past Medical History  Diagnosis Date  . Tinnitus   . Depression   . Anxiety   . Diverticulosis   . Colon polyps   . Tinnitus     Past Surgical History  Procedure Date  . Lithotripsy   . Tonsillectomy and adenoidectomy     Family History  Problem Relation Age of Onset  . Dementia Mother   . Heart disease Father   . Bone cancer Father   . Colon cancer Neg Hx   . Esophageal cancer Neg Hx   . Rectal cancer Neg Hx   . Stomach cancer Neg Hx     Social History:  reports that Eduardo Fuentes has never smoked. Eduardo Fuentes has never used smokeless tobacco. Eduardo Fuentes reports that Eduardo Fuentes  does not drink alcohol or use illicit drugs.  Allergies: No Known Allergies  Medications: I have reviewed the patient's current medications.  Results for orders placed during the hospital encounter of 06/03/11 (from the past 48 hour(s))  CBC     Status: Abnormal   Collection Time   06/03/11 11:50 AM      Component Value Range Comment   WBC 4.5  4.0 - 10.5 (K/uL)    RBC 5.12  4.22 - 5.81 (MIL/uL)    Hemoglobin 17.0  13.0 - 17.0 (g/dL)    HCT 14.7  82.9 - 56.2 (%)    MCV 91.6  78.0 - 100.0 (fL)    MCH 33.2  26.0 - 34.0 (pg)    MCHC 36.2 (*) 30.0 - 36.0 (g/dL)    RDW 13.0  86.5 - 78.4 (%)    Platelets 290  150 - 400 (K/uL)   COMPREHENSIVE METABOLIC PANEL     Status: Abnormal   Collection Time   06/03/11 11:50 AM      Component Value Range Comment   Sodium 135  135 - 145 (mEq/L)    Potassium 4.1  3.5 - 5.1 (mEq/L)    Chloride 97  96 - 112 (mEq/L)    CO2 31  19 - 32 (mEq/L)    Glucose, Bld 122 (*) 70 - 99 (mg/dL)    BUN  11  6 - 23 (mg/dL)    Creatinine, Ser 2.13  0.50 - 1.35 (mg/dL)    Calcium 9.9  8.4 - 10.5 (mg/dL)    Total Protein 7.1  6.0 - 8.3 (g/dL)    Albumin 4.0  3.5 - 5.2 (g/dL)    AST 21  0 - 37 (U/L)    ALT 21  0 - 53 (U/L)    Alkaline Phosphatase 102  39 - 117 (U/L)    Total Bilirubin 0.9  0.3 - 1.2 (mg/dL)    GFR calc non Af Amer 76 (*) >90 (mL/min)    GFR calc Af Amer 89 (*) >90 (mL/min)   ETHANOL     Status: Normal   Collection Time   06/03/11 11:50 AM      Component Value Range Comment   Alcohol, Ethyl (B) <11  0 - 11 (mg/dL)   URINE RAPID DRUG SCREEN (HOSP PERFORMED)     Status: Abnormal   Collection Time   06/03/11 11:53 AM      Component Value Range Comment   Opiates NONE DETECTED  NONE DETECTED     Cocaine NONE DETECTED  NONE DETECTED     Benzodiazepines POSITIVE (*) NONE DETECTED     Amphetamines NONE DETECTED  NONE DETECTED     Tetrahydrocannabinol NONE DETECTED  NONE DETECTED     Barbiturates NONE DETECTED  NONE DETECTED      Ct Head Wo  Contrast  06/03/2011  *RADIOLOGY REPORT*  Clinical Data: Temporal headache and dizziness.  CT HEAD WITHOUT CONTRAST  Technique:  Contiguous axial images were obtained from the base of the skull through the vertex without contrast.  Comparison: MRI 03/09/2011  Findings: Ventricle size is normal.  Negative for infarct, mass, or intracranial hemorrhage.  Calvarium is intact.  No edema or mass effect.  IMPRESSION: Negative  Original Report Authenticated By: Camelia Phenes, M.D.    No psychosis and Positive for anxiety and sleep disturbance Blood pressure 119/76, pulse 84, temperature 98 F (36.7 C), resp. rate 20, SpO2 99.00%.   Assessment/Plan: Generalized anxiety disorder   Patient does not meet criteria for acute psychiatric hospitalization Eduardo Fuentes was referred to the intensive outpatient program and to [facility] Recommended Xanax 0.5 mg 4 times daily until farther outpatient appointment Patient and his family agreed with the recommendation    Kendell Sagraves,JANARDHAHA R. 06/03/2011, 5:41 PM

## 2011-06-03 NOTE — ED Notes (Signed)
C/o being anxious.

## 2011-06-03 NOTE — ED Provider Notes (Signed)
History     CSN: [id number]  Arrival date & time 06/03/11  1124   First MD Initiated Contact with Patient 06/03/11 1221      Chief Complaint  Patient presents with  . V70.1    stress, anxiety, s/i x1 week    (Consider location/radiation/quality/duration/timing/severity/associated sxs/prior treatment) HPI Comments: Pt states he has a lot of problems with anxiety. States recently his issue has been new diagnosis or tinnitus. Stats he has constant ringing in his ears, which he states "I cannot take any more." Pt has been evaluated by ENT and his doctor. He has referrals to a neurologist. He stats for a while, his anxiety medications have been helping him deal with this, but recently he has had no relief. States in the last few days, he kept thinking "I wish i would not wake up in the morning." States keeps thinking about ending his life because he cannot deal with this anymore. He called his therapist today and was told to come here. He denies any other complaints.   The history is provided by the patient.    Past Medical History  Diagnosis Date  . Tinnitus   . Depression   . Anxiety   . Diverticulosis   . Colon polyps   . Tinnitus     Past Surgical History  Procedure Date  . Lithotripsy   . Tonsillectomy and adenoidectomy     Family History  Problem Relation Age of Onset  . Dementia Mother   . Heart disease Father   . Bone cancer Father   . Colon cancer Neg Hx   . Esophageal cancer Neg Hx   . Rectal cancer Neg Hx   . Stomach cancer Neg Hx     History  Substance Use Topics  . Smoking status: Never Smoker   . Smokeless tobacco: Never Used  . Alcohol Use: No      Review of Systems  Constitutional: Negative for fever and chills.  HENT: Positive for tinnitus. Negative for ear pain, congestion, sore throat and neck stiffness.   Eyes: Negative.   Respiratory: Negative.   Cardiovascular: Negative.   Gastrointestinal: Negative.   Genitourinary: Negative.     Musculoskeletal: Negative.   Skin: Negative.   Neurological: Negative for dizziness, weakness, light-headedness, numbness and headaches.  Psychiatric/Behavioral: The patient is nervous/anxious.     Allergies  Review of patient's allergies indicates no known allergies.  Home Medications   Current Outpatient Rx  Name Route Sig Dispense Refill  . ACETAMINOPHEN 500 MG PO TABS Oral Take 1,000 mg by mouth every 6 (six) hours as needed. pain    . ALPRAZOLAM 0.5 MG PO TABS Oral Take 0.5 mg by mouth 3 (three) times daily as needed. Anxiety    . DESVENLAFAXINE SUCCINATE ER 50 MG PO TB24 Oral Take 50 mg by mouth daily.    Marland Kitchen FLUTICASONE PROPIONATE 50 MCG/ACT NA SUSP Nasal Place 2 sprays into the nose daily. 16 g 0  . HYDROCHLOROTHIAZIDE 12.5 MG PO TABS Oral Take 12.5 mg by mouth daily.    Marland Kitchen LOSARTAN POTASSIUM 50 MG PO TABS Oral Take 50 mg by mouth daily.    Marland Kitchen PAROXETINE HCL 30 MG PO TABS Oral Take 30 mg by mouth every morning.      BP 119/76  Pulse 84  Temp 98 F (36.7 C)  Resp 20  SpO2 99%  Physical Exam  Nursing note and vitals reviewed. Constitutional: He is oriented to person, place, and time. He appears well-developed  and well-nourished. No distress.  HENT:  Head: Normocephalic and atraumatic.  Right Ear: Tympanic membrane, external ear and ear canal normal.  Left Ear: Tympanic membrane, external ear and ear canal normal.  Nose: Nose normal.  Mouth/Throat: Oropharynx is clear and moist.  Eyes: Conjunctivae are normal.  Neck: Neck supple.  Cardiovascular: Normal rate, regular rhythm and normal heart sounds.   Pulmonary/Chest: Effort normal and breath sounds normal. No respiratory distress.  Abdominal: Soft. Bowel sounds are normal. There is no tenderness.  Musculoskeletal: He exhibits no edema and no tenderness.  Lymphadenopathy:    He has no cervical adenopathy.  Neurological: He is alert and oriented to person, place, and time.  Skin: Skin is warm and dry.  Psychiatric:        Pt appears very anxious    ED Course  Procedures (including critical care time)  Labs Reviewed  CBC - Abnormal; Notable for the following:    MCHC 36.2 (*)    All other components within normal limits  COMPREHENSIVE METABOLIC PANEL - Abnormal; Notable for the following:    Glucose, Bld 122 (*)    GFR calc non Af Amer 76 (*)    GFR calc Af Amer 89 (*)    All other components within normal limits  URINE RAPID DRUG SCREEN (HOSP PERFORMED) - Abnormal; Notable for the following:    Benzodiazepines POSITIVE (*)    All other components within normal limits  ETHANOL   Ct Head Wo Contrast  06/03/2011  *RADIOLOGY REPORT*  Clinical Data: Temporal headache and dizziness.  CT HEAD WITHOUT CONTRAST  Technique:  Contiguous axial images were obtained from the base of the skull through the vertex without contrast.  Comparison: MRI 03/09/2011  Findings: Ventricle size is normal.  Negative for infarct, mass, or intracranial hemorrhage.  Calvarium is intact.  No edema or mass effect.  IMPRESSION: Negative  Original Report Authenticated By: Camelia Phenes, M.D.   Pt with tinnitus. States "I cant take this ringing any more, they cant figure out what is causing it, and i cant deal with it, i just want to die."  Will get [facility] to come and assess. Pt currently in no distress, but is very anxious.  Spoke with [facility], they will see pt   1. Anxiety   2. Tinnitus       MDM          Lottie Mussel, PA 06/04/11 1608

## 2011-06-03 NOTE — ED Provider Notes (Addendum)
Patient spoke with psychiatry. He was comfortable with discharge home at this point. Recommendation from psychiatry was to increase his Xanax to 4 times a day. New prescription for this was provided. Patient will followup with IOP. Patient denied any suicidal ideation and was able to be safely discharged at this time.  Cyndra Numbers, MD 06/03/11 1714  Cyndra Numbers, MD 06/03/11 [phone]

## 2011-06-04 ENCOUNTER — Other Ambulatory Visit (HOSPITAL_COMMUNITY): Payer: BC Managed Care – PPO

## 2011-06-04 ENCOUNTER — Other Ambulatory Visit (HOSPITAL_COMMUNITY): Payer: BC Managed Care – PPO | Attending: Psychiatry

## 2011-06-04 ENCOUNTER — Encounter (HOSPITAL_COMMUNITY): Payer: Self-pay

## 2011-06-04 DIAGNOSIS — F329 Major depressive disorder, single episode, unspecified: Secondary | ICD-10-CM

## 2011-06-04 DIAGNOSIS — Z860100 Personal history of colon polyps, unspecified: Secondary | ICD-10-CM | POA: Insufficient documentation

## 2011-06-04 DIAGNOSIS — I10 Essential (primary) hypertension: Secondary | ICD-10-CM | POA: Insufficient documentation

## 2011-06-04 DIAGNOSIS — H9319 Tinnitus, unspecified ear: Secondary | ICD-10-CM | POA: Insufficient documentation

## 2011-06-04 DIAGNOSIS — F411 Generalized anxiety disorder: Secondary | ICD-10-CM | POA: Insufficient documentation

## 2011-06-04 DIAGNOSIS — K5730 Diverticulosis of large intestine without perforation or abscess without bleeding: Secondary | ICD-10-CM | POA: Insufficient documentation

## 2011-06-04 DIAGNOSIS — F332 Major depressive disorder, recurrent severe without psychotic features: Secondary | ICD-10-CM | POA: Insufficient documentation

## 2011-06-04 DIAGNOSIS — F607 Dependent personality disorder: Secondary | ICD-10-CM | POA: Insufficient documentation

## 2011-06-04 NOTE — Progress Notes (Signed)
Patient ID: Eduardo Fuentes, male   DOB: 1950-04-22, 61 y.o.   MRN: [id number] D:  This is a 62 mwm referred per Dr. Evelene Croon, treatment for depressive and anxiety symptoms.  States he was dx with Tinnitus Oct. 2012, and his sx's of depression and anxiety began to worsen.  Has lost some of his hearing and the doctor suggested a hearing aide.  Pt has a hearing aide.  States he's not motivated to do anything and is in bed a lot.  Was on Paxil for five years, weaned off Jan. 2013, started on Lexapro but d/t side effects was instructed to stop it and restart on Paxil 30 mg.  Two weeks ago Dr. Evelene Croon started pt on Prestiq.  Pt states that the ringing in his ear has worsened.  Has upcoming appointment with a hearing clinic.  Also, pt is worried about his wife who has heart issues.  Pt mentioned that the family dog got ill last week and that added to his stress. Childhood:  "Good childhood."  No abuse or trauma. Sibling:  Younger sister who struggles with anxiety. Pt denies any drugs/ETOH.   Pt has worked for [employer] for 19 years.  States he likes his job.  Currently out on short term disability until April 30.  Reports his support system includes his wife of 23 yrs marriage and his sister.  Pt completed all forms.  Scored 34 on the burns.  A:  Oriented pt.  Provided pt with an orientation folder.  Informed Dr. Evelene Croon of admit.  Encourage support groups.  R:  Pt receptive.

## 2011-06-04 NOTE — Progress Notes (Signed)
    Daily Group Progress Note  Program: IOP  Group Time: 9:00-10:30 am   Participation Level: Active  Behavioral Response: Appropriate  Type of Therapy:  Process Group  Summary of Progress: Patient reports stable mood but fears about leaving the safety and support of the group. He states he missed group yesterday due to physical complications from his heart condition and how he was sad that he could not attend.      Group Time: 10:30 am - 12:00 pm   Participation Level:  Active  Behavioral Response: Appropriate  Type of Therapy: Psycho-education Group  Summary of Progress: Patient participated in an education segment on how to set healthy boundaries to ensure wellness.   Maxcine Ham, MSW, LCSW

## 2011-06-04 NOTE — Progress Notes (Unsigned)
Psychiatric Assessment Adult  Patient Identification:  Eduardo Fuentes Date of Evaluation:  06/04/2011 Chief Complaint: Depression and anxiety History of Chief Complaint:  61 year old white male admitted because of depression and anxiety. Agent was referred to was by Dr. Evelene Croon. Patient states that he was diagnosed with tinnitus in tuber of 2012A and subsequently had sinusitis in December and ended up taking 7 different antibiotics. She became very dangerous at this time and was started on Xanax his PCP who had been giving him Paxil then decided to discontinue Paxil and put him on Lexapro which caused him severe insomnia and so he discontinued the Lexapro increase started the Paxil and referred him to Dr. Evelene Croon.  Then decided to start the patient on Presti 50 mg every day and has been tapering his Paxil down.  Patient states that the prostate has made his tinnitus worse and he constantly ruminates if he'll ever get better and  is unable to relax. He is also worried about his wife who has heart problems and his dog who has recently been diagnosed with pancreatitis. Patient was seen at the audio clinic and was diagnosed with tinnitus he has an appointment this week at the hearing clinic.  Chief Complaint  Patient presents with  . Depression  . Anxiety  . Panic Attack    HPI Review of Systems Physical Exam  Depressive Symptoms: depressed mood, anhedonia, psychomotor retardation, fatigue, feelings of worthlessness/guilt, difficulty concentrating, hopelessness, anxiety, panic attacks, loss of energy/fatigue,  (Hypo) Manic Symptoms:  None   Anxiety Symptoms: Excessive Worry:  Yes Panic Symptoms:  Yes Agoraphobia:  No Obsessive Compulsive: Yes  Symptoms: Checking, Specific Phobias:  Yes Social Anxiety:  Yes  Psychotic Symptoms: None   PTSD Symptoms: None Traumatic Brain Injury: No   Past Psychiatric History: Diagnosis: Depression   Hospitalizations: none  Outpatient Care:  Prescribed by his PCP Paxil and then Lexapro. Now sees Dr. Evelene Croon  Substance Abuse Care:   Self-Mutilation:   Suicidal Attempts:   Violent Behaviors:    Past Medical History:   Past Medical History  Diagnosis Date  . Tinnitus   . Depression   . Anxiety   . Diverticulosis   . Colon polyps   . Tinnitus   . High blood pressure    History of Loss of Consciousness:  No Seizure History:  No Cardiac History:  No Allergies:  No Known Allergies Current Medications:  Current Outpatient Prescriptions  Medication Sig Dispense Refill  . acetaminophen (TYLENOL) 500 MG tablet Take 1,000 mg by mouth every 6 (six) hours as needed. pain      . ALPRAZolam (XANAX) 0.5 MG tablet Take 0.5 mg by mouth 3 (three) times daily as needed. Anxiety      . ALPRAZolam (XANAX) 0.5 MG tablet Take 1 tablet (0.5 mg total) by mouth 4 (four) times daily as needed for anxiety.  120 tablet  0  . fluticasone (FLONASE) 50 MCG/ACT nasal spray Place 2 sprays into the nose daily.  16 g  0  . hydrochlorothiazide (HYDRODIURIL) 12.5 MG tablet Take 12.5 mg by mouth daily.      Marland Kitchen losartan (COZAAR) 50 MG tablet Take 50 mg by mouth daily.      Marland Kitchen PARoxetine (PAXIL) 30 MG tablet Take 30 mg by mouth every morning.      . desvenlafaxine (PRISTIQ) 50 MG 24 hr tablet Take 50 mg by mouth daily.       No current facility-administered medications for this visit.   Facility-Administered Medications  Ordered in Other Visits  Medication Dose Route Frequency Provider Last Rate Last Dose  . DISCONTD: acetaminophen (TYLENOL) tablet 650 mg  650 mg Oral Q4H PRN Tatyana A Kirichenko, PA      . DISCONTD: hydrochlorothiazide (HYDRODIURIL) tablet 12.5 mg  12.5 mg Oral Daily Tatyana A Kirichenko, PA      . DISCONTD: ibuprofen (ADVIL,MOTRIN) tablet 600 mg  600 mg Oral Q8H PRN Tatyana A Kirichenko, PA      . DISCONTD: LORazepam (ATIVAN) tablet 1 mg  1 mg Oral Q6H PRN Tatyana A Kirichenko, PA   1 mg at 06/03/11 1550  . DISCONTD: LORazepam (ATIVAN) tablet  2 mg  2 mg Oral Once Tatyana A Kirichenko, PA      . DISCONTD: losartan (COZAAR) tablet 50 mg  50 mg Oral Daily Tatyana A Kirichenko, PA      . DISCONTD: ondansetron (ZOFRAN) tablet 4 mg  4 mg Oral Q8H PRN Tatyana A Kirichenko, PA      . DISCONTD: PARoxetine (PAXIL) tablet 30 mg  30 mg Oral Daily Tatyana A Kirichenko, PA      . DISCONTD: venlafaxine (EFFEXOR-XR) 24 hr capsule 37.5 mg  37.5 mg Oral Q breakfast Tatyana A Kirichenko, PA      . DISCONTD: zolpidem (AMBIEN) tablet 5 mg  5 mg Oral QHS PRN Tatyana A Kirichenko, PA        Previous Psychotropic Medications:  Medication Dose   Paxil and Lexapro                        Substance Abuse History in the last 12 months: None Substance Age of 1st Use Last Use Amount Specific Type  Nicotine      Alcohol      Cannabis      Opiates      Cocaine      Methamphetamines      LSD      Ecstasy      Benzodiazepines      Caffeine      Inhalants      Others:                          Medical Consequences of Substance Abuse: None  Legal Consequences of Substance Abuse: None  Family Consequences of Substance Abuse: None  Blackouts:  No DT's:  No Withdrawal Symptoms:  No None  Social History: Current Place of Residence:  Place of Birth:Family Members:  Marital Status:  Married Children:   Sons:   Daughters: Relationships:  Education:  HS Print production planner Problems/Performance:  Religious Beliefs/Practices:  History of Abuse: none Teacher, music History:  None. Legal History:  Hobbies/Interests:   Family History:   Family History  Problem Relation Age of Onset  . Dementia Mother   . Anxiety disorder Mother   . Heart disease Father   . Bone cancer Father   . Colon cancer Neg Hx   . Esophageal cancer Neg Hx   . Rectal cancer Neg Hx   . Stomach cancer Neg Hx   . Anxiety disorder Sister     Mental Status Examination/Evaluation: Objective:  Appearance: Casual and Disheveled  Eye  Contact::  Good  Speech:  Clear and Coherent and Pressured  Volume:  Normal  Mood:  Depressed and very anxious  Affect:  Constricted, Depressed and Tearful  Thought Process:  Circumstantial and Linear  Orientation:  Full  Thought Content:  Obsessions and Rumination  Suicidal Thoughts:  No  Homicidal Thoughts:  No  Judgement:  Fair  Insight:  Shallow  Psychomotor Activity:  Normal  Akathisia:  No  Handed:  Right  AIMS (if indicated):    Assets:  Communication Skills Desire for Improvement Social Support    Laboratory/X-Ray Psychological Evaluation(s)        Assessment:  Axis I: Major Depression, Recurrent severe  AXIS I Anxiety Disorder NOS  AXIS II Histrionic Personality traits  AXIS III Past Medical History  Diagnosis Date  . Tinnitus   . Depression   . Anxiety   . Diverticulosis   . Colon polyps   . Tinnitus   . High blood pressure      AXIS IV occupational problems, other psychosocial or environmental problems, problems related to social environment and problems with primary support group  AXIS V 51-60 moderate symptoms   Treatment Plan/Recommendations:  Plan of Care: start IOP  Laboratory:  None  Psychotherapy: Individual and group therapy   Medications: Patient will continue Xanax 0.5 mg a.m. and noon and will take 1 mg at at bedtime. #2  DC Pristiq. #3 cont Paxil 30 mg every day . Start Claritin 10 mg every day and he'll continue all of his other medications   Routine PRN Medications:  Yes  Consultations:   Safety Concerns:  None   Other:     Margit Banda [facility] 4/10/20132:14 PM

## 2011-06-05 ENCOUNTER — Encounter (HOSPITAL_COMMUNITY): Payer: Self-pay

## 2011-06-05 ENCOUNTER — Ambulatory Visit: Payer: BC Managed Care – PPO | Admitting: Internal Medicine

## 2011-06-05 ENCOUNTER — Other Ambulatory Visit (HOSPITAL_COMMUNITY): Payer: BC Managed Care – PPO

## 2011-06-05 NOTE — Progress Notes (Signed)
    Daily Group Progress Note  Program: IOP  Group Time: 9:00-10:30 am   Participation Level: Active  Behavioral Response: Appropriate  Type of Therapy:  Process Group  Summary of Progress: Patient appears anxious with racing thoughts and accelerated speech. He requires redirection to allows others to share and appears to struggle to listen to and empathize with other members. Boundaries were set with him to allow others to share and he was challenged to try to identify the feelings of others to try to get him outwardly directed instead of so inwardly focused. He reports having negative, worrisome thinking and depression that began when he was diagnosed with ringing in his ear.      Group Time: 10:30 am - 12:00 pm   Participation Level:  Active  Behavioral Response: Appropriate  Type of Therapy: Psycho-education Group  Summary of Progress: Preparing for Discharge. Patient identified a personalized plan to get ready to end the program and continuing getting support for mood stability.   Maxcine Ham, MSW, LCSW

## 2011-06-06 ENCOUNTER — Other Ambulatory Visit (HOSPITAL_COMMUNITY): Payer: BC Managed Care – PPO
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
## 2011-06-09 ENCOUNTER — Other Ambulatory Visit (HOSPITAL_COMMUNITY): Payer: BC Managed Care – PPO

## 2011-06-09 MED ORDER — RISPERIDONE 1 MG PO TABS: 1.0000 mg | ORAL_TABLET | Freq: Every day | ORAL | Status: DC

## 2011-06-09 NOTE — Progress Notes (Signed)
    Daily Group Progress Note  Program: IOP  Group Time: 9:00-10:30 am   Participation Level: Active  Behavioral Response: anxious  Type of Therapy:  Process Group  Summary of Progress: Patients anxiety is so high it appears to be interfering with his ability to benefit from the group. This was addressed with him in the group and members expressed their experience in responding to patients high anxiety level. Patient is so inwardly focused on his feelings of anxiety and ear ringing that he is unaware of what is going on int he group around him. The doctor recommended that he begin a dose of Risperdal to help reduce feelings of anxiety and allow him to be more present with the group.      Group Time: 10:30 am - 12:00 pm   Participation Level:  Active  Behavioral Response: Appropriate  Type of Therapy: Psycho-education Group  Summary of Progress: Patient learned an anxiety management technique called EASE and practiced feeling identification and practicing breathing in the feeling of ease and calm while breathing to reduce anxiety feelings.   Maxcine Ham, MSW, LCSW

## 2011-06-09 NOTE — Progress Notes (Unsigned)
Patient ID: Eduardo Fuentes, male   DOB: [birth year], 61 y.o.   MRN: [id number] Pt seen along with Jeri Modena, continues to be anxious, sleep-initial insomnia, appetite-good, mood-dysphoric and anxious. Continues to have ringing in his ears. Pt obcessesses about it.  Discusse R/R/B/O of Risperdal 1 mg po q hs for his OCD. Pt gave informed consent.

## 2011-06-09 NOTE — ED Provider Notes (Signed)
Medical screening examination/treatment/procedure(s) were performed by non-physician practitioner and as supervising physician I was immediately available for consultation/collaboration.   Gavin Pound. Oletta Lamas, MD 06/09/11 [phone]

## 2011-06-10 ENCOUNTER — Other Ambulatory Visit (HOSPITAL_COMMUNITY): Payer: BC Managed Care – PPO

## 2011-06-10 NOTE — Progress Notes (Signed)
    Daily Group Progress Note  Program: IOP  Group Time: 9:00-10:30 am   Participation Level: Active  Behavioral Response: Appropriate  Type of Therapy:  Process Group  Summary of Progress: Patient discussed the skill of mindfulness and how it is used to combat depression and anxiety.      Group Time: 10:30 am - 12:00 pm   Participation Level:  Active  Behavioral Response: Appropriate  Type of Therapy: Psycho-education Group  Summary of Progress:  Patient practiced the skill of mindfulness using the skill of observation and description.   Adien Kimmel, MSW, LCSW  

## 2011-06-11 ENCOUNTER — Other Ambulatory Visit (HOSPITAL_COMMUNITY): Payer: BC Managed Care – PPO

## 2011-06-11 NOTE — Progress Notes (Unsigned)
Patient ID: Eduardo Fuentes, male   DOB: Dec 10, 1950, 61 y.o.   MRN: [id number] Pt seen with Jeri Modena , states hes waking up in the am and lies there worrying , has not increased the xanax to 1 mg but takes only 0.5 mg q hs along with Risperdal 0.5 mg. Discussed taking 1 mg xanax along with 1 mg Risperdal.Pt stated understanding, also recommend reading , and listening to the birds.

## 2011-06-11 NOTE — Progress Notes (Unsigned)
    Daily Group Progress Note  Program: {CHL AMB BH IOP/CDIOP Program Type:21022744}  Group Time:   Participation Level: {CHL AMB BH Group Participation:21022742}  Behavioral Response: {CHL AMB BH Group Behavior:21022743}  Type of Therapy:  {CHL AMB BH Type of Therapy:21022741}  Summary of Progress: ***     Group Time:   Participation Level:  {CHL AMB BH Group Participation:21022742}  Behavioral Response: {CHL AMB BH Group Behavior:21022743}  Type of Therapy: {CHL AMB BH Type of Therapy:21022741}  Summary of Progress: Patient participated in a discussion on how to identify depression and anxiety symptoms and learned about the components of each diagnosis  [facility] 

## 2011-06-12 ENCOUNTER — Other Ambulatory Visit (HOSPITAL_COMMUNITY): Payer: BC Managed Care – PPO

## 2011-06-12 DIAGNOSIS — F332 Major depressive disorder, recurrent severe without psychotic features: Secondary | ICD-10-CM | POA: Insufficient documentation

## 2011-06-12 DIAGNOSIS — H9319 Tinnitus, unspecified ear: Secondary | ICD-10-CM | POA: Insufficient documentation

## 2011-06-12 DIAGNOSIS — Z860100 Personal history of colon polyps, unspecified: Secondary | ICD-10-CM | POA: Insufficient documentation

## 2011-06-12 DIAGNOSIS — I10 Essential (primary) hypertension: Secondary | ICD-10-CM | POA: Insufficient documentation

## 2011-06-12 DIAGNOSIS — F411 Generalized anxiety disorder: Secondary | ICD-10-CM | POA: Insufficient documentation

## 2011-06-12 DIAGNOSIS — K5730 Diverticulosis of large intestine without perforation or abscess without bleeding: Secondary | ICD-10-CM | POA: Insufficient documentation

## 2011-06-12 DIAGNOSIS — F607 Dependent personality disorder: Secondary | ICD-10-CM | POA: Insufficient documentation

## 2011-06-12 NOTE — Progress Notes (Signed)
    Daily Group Progress Note  Program: IOP  Group Time: 9:00-10:30 am   Participation Level: Active  Behavioral Response: Appropriate  Type of Therapy:  Process Group  Summary of Progress: Patient appears less anxious as evident by less racing thoughts and need for verbal redirection to allow others time to share. He did not mention the ringing in his ear once today and his focus appears to be shifting from that to trying to reduce his anxiety symptoms. He states he is feeling more hopeful each day and is enjoying attending the group. He needs continued work on anxiety reduction techniques and states he is practicing the breathing technique.      Group Time: 10:30 am - 12:00 pm   Participation Level:  Active  Behavioral Response: Appropriate  Type of Therapy: Psycho-education Group  Summary of Progress:  Patient was introduced to effective communication techniques and how to use assertive communication to have their needs met and reduce feelings of depression and anger.   Maxcine Ham, MSW, LCSW

## 2011-06-13 ENCOUNTER — Other Ambulatory Visit (HOSPITAL_COMMUNITY): Payer: BC Managed Care – PPO

## 2011-06-13 NOTE — Progress Notes (Signed)
    Daily Group Progress Note  Program: IOP  Group Time: 9:00-10:30 am   Participation Level: Active  Behavioral Response: Appropriate  Type of Therapy:  Process Group  Summary of Progress: Patient appears much less anxious and did not talk once about the ringing in his ear. He brought a laughing dog toy to show the group and encourage laughter. He states he is enjoying attending the group and feels more hopeful each day. He still struggles with low energy and is adjusting to the new medication Risperdal. He is learning ways to manage his anxiety and challenge negative worrisome thinking.      Group Time: 10:30 am - 12:00 pm   Participation Level:  Active  Behavioral Response: Appropriate  Type of Therapy: Psycho-education Group  Summary of Progress: Patient participated in and educational activity on "self-esteem", the definition, origination and how to improve it. Patient identified their own personal  Level of esteem and contributing factors that impacted it from childhood as well as how to challenge it with positive affirmations.    Maxcine Ham, MSW, LCSW

## 2011-06-16 ENCOUNTER — Other Ambulatory Visit (HOSPITAL_COMMUNITY): Payer: BC Managed Care – PPO

## 2011-06-17 ENCOUNTER — Encounter (HOSPITAL_COMMUNITY): Payer: Self-pay | Admitting: Psychiatry

## 2011-06-17 ENCOUNTER — Other Ambulatory Visit (HOSPITAL_COMMUNITY): Payer: BC Managed Care – PPO

## 2011-06-17 NOTE — Patient Instructions (Signed)
Patient successfully completed MH-IOP today.  Patient will follow up with Dr. Evelene Croon on 06-20-11 at 12:45pm.  He is requesting to schedule his own follow up appointment with current therapist Hurley Cisco, LCSW).  Encouraged support groups.

## 2011-06-17 NOTE — Progress Notes (Signed)
    Daily Group Progress Note  Program: IOP  Group Time: 9:00-10:30 am   Participation Level: Active  Behavioral Response: Appropriate  Type of Therapy:  Process Group  Summary of Progress: Today was his last day in the group. He reports minimal depression and anxiety symptoms and described making good progress in the group. He is less focused on his ear ringing and more engaged in life. He states he is sad to leave the group and is aware he needs scheduled activities to get him out of the house in the mornings to mange his anxiety symptoms.      Group Time: 10:30 am - 12:00 pm   Participation Level:  Active  Behavioral Response: Appropriate  Type of Therapy: Process Group  Summary of Progress: Patient participated in a goodbye ceremony for a member leaving the group.   Maxcine Ham, MSW, LCSW

## 2011-06-17 NOTE — Progress Notes (Signed)
    Daily Group Progress Note  Program: IOP  Group Time: 9:00-10:30 am  Participation Level: Active  Behavioral Response: Appropriate  Type of Therapy:  Process Group  Summary of Progress: He appears less anxious and reports feeling much better. He is scheduled to end tomorrow and described feeling sad and worrying about how he will use his time in the morning since that is when he is most anxious.      Group Time: 10:30 am - 12:00 pm   Participation Level:  Active  Behavioral Response: Appropriate  Type of Therapy: Psycho-education Group  Summary of Progress: Patient participated in a discussion and education segment on sleep hygiene and identified ways to improve sleep.  Maxcine Ham, MSW, LCSW

## 2011-06-17 NOTE — Progress Notes (Signed)
Patient ID: Eduardo Fuentes, male   DOB: 09/28/1950, 61 y.o.   MRN: [id number] D:  Pt successfully completed MH-IOP today.  Denies any SI, HI, A/V hallucinations.  Reports improved mood.  States his depression and anxiety has decreased some.  Complaints of pressure in his head, nausea, legs feeling weak and feeling fatigue.  Has upcoming appointment with a neurologist re: ringing in his ear.  A:  D/C pt today.  Follow up with Dr. Evelene Croon on 06-20-11.  Pt is requesting to schedule his f/u appt with Hurley Cisco, LCSW.  R:  Pt receptive.

## 2011-06-18 ENCOUNTER — Other Ambulatory Visit (HOSPITAL_COMMUNITY): Payer: BC Managed Care – PPO

## 2011-06-18 NOTE — Progress Notes (Unsigned)
Patient is a reason in GE. Are within E5 as he lower [facility] and is is [facility] Intensive Outpatient Program Discharge Summary  LISTON THUM [id number]  Discharge Note  Patient:  Eduardo Fuentes is an 61 y.o., male DOB:  Jul 12, 1950  Date of Admission:  06-04-11 Date of Discharge:  06-17-11  Reason for Admission:depression and severe anxiety.  Hospital Course:61 year old white male admitted with depression and anxiety, anhedonia and had been having a very difficult time functioning was unable to get out of bed. Patient also had been diagnosed with tinnitus and constantly ruminated about bringing in his ears. Patient had had hearing tests done that did not find them helpful. His outpatient psychiatrist Dr.Kaur had started him on Pristiq 50 mg which she stated had made bringing worse. Patient was feeling hopeless and helpless. Patient was also taking Paxil 30 mg, Xanax 0.5 mg 3 times a day and hydrochlorothiazide 12.5 every day. Meds changes that were made for her his Pristiq was discontinued, and he was continued on his Paxil 30 mg every day. His Xanax was changed to 0.5 mg a.m. And known and 1 mg at bedtime. Because of his severe anxiety and constant rumination and agitation because of the drinking he was started on Risperdal 0.5 mg at bedtime this was then increased to 1 mg at bedtime and he tolerated this well, patient's anxiety decreased as did the ringing in his ears. Patient was coping significantly better his sleep was good appetite was good mood had brightened up. Patient still had mild anxiety but was overall coping very well. Cognitive behavior therapy was done with him individually and in group to help him cope with his anxiety. He responded well to that. He had no suicidal ideation and stated that his anxiety had decreased and his mood had improved and the ringing in his years was only present in the evenings) went to bed. He was tolerating his medications  well  Mental Status at Dischargeat the time of discharge:  Patient was alert oriented x3, affect was appropriate mood was stable. Speech was normal there was no agitation or restlessness he had minimal anxiety. No suicidal or homicidal ideation was noted no hallucinations or delusions were present. Recent and remote memory was good, judgment and insight was good concentration and recall were good.  Lab Results: No results found for this or any previous visit (from the past 48 hour(s)).  Discharge medications #1 Paxil 30 mg by mouth every afternoon, #2 Xanax 0.5 mg a.m. And known and 1 mg at at bedtime, Risperdal 1 mg at at bedtime. Hydrochlorothiazide 12.5 mg daily. 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 Tinnitus   . Depression   . Anxiety   . Diverticulosis   . Colon polyps   . Tinnitus   . High blood pressure    Axis IV: occupational problems, problems related to social environment and problems with primary support group Axis V: 61-70 mild symptoms   Level of Care:  OP  Discharge destination:  Home  Is patient on multiple antipsychotic therapies at discharge:  No    Has Patient had three or more failed trials of antipsychotic monotherapy by history:  No  Patient phone:  [phone] (home)  Patient address:   [address] [place] [address],   Follow-up recommendations:  Activity:  as tolerated Diet:  regular  Other:  he'll see Dr. Evelene Croon for medications and [facility]bar[facility]us[facility]ick for therapy  Comments:  At the time of discharge patient was not suicidal or homicidal and was not psychotic  The patient received suicide prevention pamphlet:  Yes Belongings returned:  Rudi Coco 06/18/2011, 10:45 AM     [facility] 06/18/2011

## 2011-06-19 ENCOUNTER — Other Ambulatory Visit (HOSPITAL_COMMUNITY): Payer: BC Managed Care – PPO

## 2011-06-20 ENCOUNTER — Other Ambulatory Visit (HOSPITAL_COMMUNITY): Payer: BC Managed Care – PPO

## 2011-06-23 ENCOUNTER — Other Ambulatory Visit (HOSPITAL_COMMUNITY): Payer: BC Managed Care – PPO

## 2011-06-24 ENCOUNTER — Other Ambulatory Visit (HOSPITAL_COMMUNITY): Payer: BC Managed Care – PPO

## 2011-06-25 ENCOUNTER — Other Ambulatory Visit (HOSPITAL_COMMUNITY): Payer: BC Managed Care – PPO

## 2011-06-26 ENCOUNTER — Other Ambulatory Visit (HOSPITAL_COMMUNITY): Payer: BC Managed Care – PPO

## 2011-06-27 ENCOUNTER — Other Ambulatory Visit (HOSPITAL_COMMUNITY): Payer: BC Managed Care – PPO

## 2011-06-30 ENCOUNTER — Other Ambulatory Visit (HOSPITAL_COMMUNITY): Payer: BC Managed Care – PPO

## 2011-07-01 ENCOUNTER — Other Ambulatory Visit (HOSPITAL_COMMUNITY): Payer: BC Managed Care – PPO

## 2011-07-02 ENCOUNTER — Other Ambulatory Visit (HOSPITAL_COMMUNITY): Payer: BC Managed Care – PPO

## 2011-07-15 ENCOUNTER — Ambulatory Visit: Payer: BC Managed Care – PPO | Attending: Neurology | Admitting: Physical Therapy

## 2011-07-15 DIAGNOSIS — M542 Cervicalgia: Secondary | ICD-10-CM | POA: Insufficient documentation

## 2011-07-15 DIAGNOSIS — IMO0001 Reserved for inherently not codable concepts without codable children: Secondary | ICD-10-CM | POA: Insufficient documentation

## 2011-07-16 ENCOUNTER — Encounter: Payer: Self-pay | Admitting: Physical Therapy

## 2011-07-22 ENCOUNTER — Ambulatory Visit: Payer: BC Managed Care – PPO | Admitting: Physical Therapy

## 2011-07-29 ENCOUNTER — Encounter: Payer: Self-pay | Admitting: Physical Therapy

## 2011-07-31 ENCOUNTER — Encounter: Payer: Self-pay | Admitting: Physical Therapy

## 2011-08-05 ENCOUNTER — Encounter: Payer: Self-pay | Admitting: Physical Therapy

## 2011-08-07 ENCOUNTER — Encounter: Payer: Self-pay | Admitting: Physical Therapy

## 2011-10-26 DIAGNOSIS — F329 Major depressive disorder, single episode, unspecified: Secondary | ICD-10-CM

## 2011-10-26 DIAGNOSIS — F32A Depression, unspecified: Secondary | ICD-10-CM

## 2011-10-26 HISTORY — DX: Depression, unspecified: F32.A

## 2011-10-26 HISTORY — DX: Major depressive disorder, single episode, unspecified: F32.9

## 2011-10-28 ENCOUNTER — Inpatient Hospital Stay (HOSPITAL_COMMUNITY)
Admission: RE | Admit: 2011-10-28 | Discharge: 2011-11-06 | DRG: 430 | Disposition: A | Discharge status: Home / Self Care | Payer: BC Managed Care – PPO | Attending: Psychiatry | Admitting: Psychiatry

## 2011-10-28 DIAGNOSIS — F332 Major depressive disorder, recurrent severe without psychotic features: Principal | ICD-10-CM | POA: Diagnosis present

## 2011-10-28 DIAGNOSIS — Z79899 Other long term (current) drug therapy: Secondary | ICD-10-CM

## 2011-10-28 DIAGNOSIS — K5730 Diverticulosis of large intestine without perforation or abscess without bleeding: Secondary | ICD-10-CM | POA: Diagnosis present

## 2011-10-28 DIAGNOSIS — F411 Generalized anxiety disorder: Secondary | ICD-10-CM

## 2011-10-28 DIAGNOSIS — I10 Essential (primary) hypertension: Secondary | ICD-10-CM | POA: Diagnosis present

## 2011-10-28 DIAGNOSIS — R42 Dizziness and giddiness: Secondary | ICD-10-CM

## 2011-10-28 DIAGNOSIS — H9319 Tinnitus, unspecified ear: Secondary | ICD-10-CM | POA: Diagnosis present

## 2011-10-28 DIAGNOSIS — F4001 Agoraphobia with panic disorder: Secondary | ICD-10-CM

## 2011-10-28 DIAGNOSIS — F339 Major depressive disorder, recurrent, unspecified: Secondary | ICD-10-CM

## 2011-10-28 MED ORDER — MAGNESIUM HYDROXIDE 400 MG/5ML PO SUSP: 30.0000 mL | Freq: Every day | ORAL | Status: DC | PRN

## 2011-10-28 MED ORDER — FLUTICASONE PROPIONATE 50 MCG/ACT NA SUSP
2.0000 | Freq: Every day | NASAL | Status: DC
  Filled 2011-10-28: qty 16

## 2011-10-28 MED ORDER — HYDROCHLOROTHIAZIDE 25 MG PO TABS
12.5000 mg | ORAL_TABLET | Freq: Every day | ORAL | Status: DC
  Filled 2011-10-28 (×2): qty 0.5

## 2011-10-28 MED ORDER — ACETAMINOPHEN 325 MG PO TABS: 650.0000 mg | ORAL_TABLET | Freq: Four times a day (QID) | ORAL | Status: DC | PRN

## 2011-10-28 MED ORDER — TRAZODONE HCL 50 MG PO TABS
50.0000 mg | ORAL_TABLET | Freq: Every evening | ORAL | Status: DC | PRN
  Filled 2011-10-28 (×2): qty 1

## 2011-10-28 MED ORDER — ACETAMINOPHEN 500 MG PO TABS
1000.0000 mg | ORAL_TABLET | Freq: Four times a day (QID) | ORAL | Status: DC | PRN
  Administered 2011-10-28: 1000 mg via ORAL
  Administered 2011-10-29: 650 mg via ORAL
  Administered 2011-10-29 – 2011-11-03 (×5): 1000 mg via ORAL
  Filled 2011-10-28 (×5): qty 2

## 2011-10-28 MED ORDER — ALUM & MAG HYDROXIDE-SIMETH 200-200-20 MG/5ML PO SUSP: 30.0000 mL | ORAL | Status: DC | PRN

## 2011-10-28 MED ORDER — LOSARTAN POTASSIUM 50 MG PO TABS
50.0000 mg | ORAL_TABLET | Freq: Every day | ORAL | Status: DC
  Administered 2011-10-29 – 2011-11-06 (×9): 50 mg via ORAL
  Filled 2011-10-28 (×10): qty 1

## 2011-10-28 MED ORDER — ALPRAZOLAM 0.5 MG PO TABS
0.5000 mg | ORAL_TABLET | Freq: Three times a day (TID) | ORAL | Status: DC | PRN
  Administered 2011-10-28: 0.5 mg via ORAL
  Filled 2011-10-28: qty 1

## 2011-10-28 MED ORDER — PAROXETINE HCL 30 MG PO TABS
30.0000 mg | ORAL_TABLET | ORAL | Status: DC
  Administered 2011-10-29: 30 mg via ORAL
  Filled 2011-10-28: qty 3
  Filled 2011-10-28 (×2): qty 1

## 2011-10-28 MED ORDER — NICOTINE 21 MG/24HR TD PT24
21.0000 mg | MEDICATED_PATCH | Freq: Every day | TRANSDERMAL | Status: DC
  Filled 2011-10-28 (×2): qty 1

## 2011-10-28 MED ORDER — VENLAFAXINE HCL ER 37.5 MG PO CP24
37.5000 mg | ORAL_CAPSULE | Freq: Every day | ORAL | Status: DC
  Administered 2011-10-30 – 2011-11-03 (×5): 37.5 mg via ORAL
  Filled 2011-10-28 (×8): qty 1

## 2011-10-28 MED ORDER — RISPERIDONE 1 MG PO TABS
1.0000 mg | ORAL_TABLET | Freq: Every day | ORAL | Status: DC
  Administered 2011-10-28: 1 mg via ORAL
  Filled 2011-10-28 (×3): qty 1

## 2011-10-28 NOTE — BH Assessment (Signed)
Assessment Note   Eduardo Fuentes is an 61 y.o. male. Patient is a walk in to [facility] behavioral health.  Patient is referred by his medical Dr.  Patient says he's very frightened, he can't go on this way, he is staying in bed, but feeling very antsy most of the day. Today he was attempting to go to work, he was able to get dressed, but he could not leave the house. He has been in intensive outpatient treatment in March and April of 2013. Tried to go back to work in June and July, has not really been able to work much more than 4 hours a day. He last was able to get to work on Friday, he believes he has lost his job.  He is taking 4 to 5 xanex 0.5 tablets yesterday and today and still cannot be calm enough to function.  Sleeps from 8:00 PM to 4:00 AM, wakes up nauseous and very anxious. He's repetatively worries that the he will never get well and he is a hopeless case. His wife has health problems and she's retired he's "getting on her nerves", talks to friends on the phone, does not get out, does not have any current activities since these anxiety symptoms started again. He says that he thinks of dying and would stab himself in the chest and motions  with his fist towards his left chest.  He doesn't want to die because he thinks that it would hurt his wife and abandon his dog, both of whom are important to him.  In no current pain, denies any psychosis, denies any homicidal ideation. He says the first time that he had trouble with anxiety and depression was in the 1990s when he was taking care of his mother who had Alzheimer's at the time. He started on Paxil and has taken it ever since.  No previous inpatient treatment.  Accepted for inpatient hospitalization by two Franchot Gallo MD M.D..     Axis I: Anxiety Disorder NOS and Depressive Disorder NOS Axis II: Deferred Axis III:  Past Medical History  Diagnosis Date  . Tinnitus   . Depression   . Anxiety   . Diverticulosis   . Colon polyps   . Tinnitus    . High blood pressure    Axis IV: economic problems, occupational problems, other psychosocial or environmental problems and problems with primary support group Axis V: 21-30 behavior considerably influenced by delusions or hallucinations OR serious impairment in judgment, communication OR inability to function in almost all areas  Past Medical History:  Past Medical History  Diagnosis Date  . Tinnitus   . Depression   . Anxiety   . Diverticulosis   . Colon polyps   . Tinnitus   . High blood pressure     Past Surgical History  Procedure Date  . Lithotripsy   . Tonsillectomy and adenoidectomy     Family History:  Family History  Problem Relation Age of Onset  . Dementia Mother   . Anxiety disorder Mother   . Heart disease Father   . Bone cancer Father   . Colon cancer Neg Hx   . Esophageal cancer Neg Hx   . Rectal cancer Neg Hx   . Stomach cancer Neg Hx   . Anxiety disorder Sister     Social History:  reports that he has never smoked. He has never used smokeless tobacco. He reports that he does not drink alcohol or use illicit drugs.  Additional Social History:  Alcohol / Drug Use Pain Medications: not abusing, takes tylenol prn Prescriptions: over using Xanex 4-5 tabs yesterday and today Over the Counter: nos History of alcohol / drug use?: No history of alcohol / drug abuse  CIWA:   COWS:    Allergies: No Known Allergies  Home Medications:  Medications Prior to Admission  Medication Sig Dispense Refill  . ALPRAZolam (XANAX) 0.5 MG tablet Take 0.5 mg by mouth 3 (three) times daily as needed. Anxiety      . hydrochlorothiazide (HYDRODIURIL) 12.5 MG tablet Take 12.5 mg by mouth daily.      Marland Kitchen losartan (COZAAR) 50 MG tablet Take 50 mg by mouth daily.      Marland Kitchen PARoxetine (PAXIL) 30 MG tablet Take 30 mg by mouth every morning.      . risperiDONE (RISPERDAL) 1 MG tablet Take 1 tablet (1 mg total) by mouth at bedtime.  30 tablet  0  . acetaminophen (TYLENOL) 500 MG  tablet Take 1,000 mg by mouth every 6 (six) hours as needed. pain      . desvenlafaxine (PRISTIQ) 50 MG 24 hr tablet Take 50 mg by mouth daily.      . fluticasone (FLONASE) 50 MCG/ACT nasal spray Place 2 sprays into the nose daily.  16 g  0    OB/GYN Status:  No LMP for male patient.  General Assessment Data Location of Assessment: [facility] Assessment Services Living Arrangements: Spouse/significant other Can pt return to current living arrangement?: Yes Admission Status: Voluntary Is patient capable of signing voluntary admission?: Yes Transfer from: Home Referral Source: MD  Education Status Is patient currently in school?: No  Risk to self Suicidal Ideation: Yes-Currently Present Suicidal Intent: No-Not Currently/Within Last 6 Months Is patient at risk for suicide?: Yes Suicidal Plan?: Yes-Currently Present Specify Current Suicidal Plan: stab self in left chest Access to Means: Yes Specify Access to Suicidal Means: knives in home What has been your use of drugs/alcohol within the last 12 months?: none Previous Attempts/Gestures: No How many times?: 0  Intentional Self Injurious Behavior: None Family Suicide History: No Recent stressful life event(s): Job Loss (due to his inability to come to work) Persecutory voices/beliefs?: No Depression: Yes Depression Symptoms: Despondent;Fatigue;Guilt;Loss of interest in usual pleasures;Feeling worthless/self pity Substance abuse history and/or treatment for substance abuse?: No Suicide prevention information given to non-admitted patients: Yes  Risk to Others Homicidal Ideation: No Thoughts of Harm to Others: No Current Homicidal Intent: No Current Homicidal Plan: No Access to Homicidal Means: No History of harm to others?: No Assessment of Violence: None Noted Does patient have access to weapons?: No Criminal Charges Pending?: No Does patient have a court date: No  Psychosis Hallucinations: None noted Delusions: None  noted  Mental Status Report Eye Contact: Fair Motor Activity: Restlessness Speech: Logical/coherent Level of Consciousness: Alert;Restless Mood: Fearful;Apprehensive;Depressed Affect: Anxious;Depressed Anxiety Level: Moderate Thought Processes: Coherent;Relevant Judgement: Impaired Orientation: Person;Place;Situation Obsessive Compulsive Thoughts/Behaviors: Minimal  Cognitive Functioning Concentration: Decreased Memory: Recent Intact;Remote Intact IQ: Average Insight: Poor Impulse Control: Poor Appetite: Fair Weight Loss: 0  Weight Gain: 0  Sleep: Increased Total Hours of Sleep: 8  Vegetative Symptoms: Staying in bed  ADLScreening [facility]) Patient's cognitive ability adequate to safely complete daily activities?: Yes Patient able to express need for assistance with ADLs?: Yes Independently performs ADLs?: Yes (appropriate for developmental age)  Abuse/Neglect [facility]) Physical Abuse: Denies Verbal Abuse: Denies Sexual Abuse: Denies  Prior Inpatient Therapy Prior Inpatient Therapy: No  Prior Outpatient Therapy Prior Outpatient  Therapy: Yes Prior Therapy Dates: since 1990s, current Prior Therapy Facilty/Provider(s): Janace Hoard MD Reason for Treatment: anxiety, depression  ADL Screening (condition at time of admission) Patient's cognitive ability adequate to safely complete daily activities?: Yes Patient able to express need for assistance with ADLs?: Yes Independently performs ADLs?: Yes (appropriate for developmental age) Weakness of Legs: None Weakness of Arms/Hands: None  Home Assistive Devices/Equipment Home Assistive Devices/Equipment: None    Abuse/Neglect Assessment (Assessment to be complete while patient is alone) Physical Abuse: Denies Verbal Abuse: Denies Sexual Abuse: Denies Exploitation of patient/patient's resources: Denies Self-Neglect: Denies       Nutrition Screen- [facility] Adult/WL/AP Patient's home diet: Regular Have you  recently lost weight without trying?: No Have you been eating poorly because of a decreased appetite?: No Malnutrition Screening Tool Score: 0   Additional Information 1:1 In Past 12 Months?: No CIRT Risk: No Elopement Risk: No Does patient have medical clearance?: No     Disposition:  Disposition Disposition of Patient: Inpatient treatment program Type of inpatient treatment program: Adult  On Site Evaluation by:   Reviewed with Physician:     Conan Bowens 10/28/2011 4:21 PM

## 2011-10-28 NOTE — Tx Team (Signed)
Initial Interdisciplinary Treatment Plan  PATIENT STRENGTHS: (choose at least two) Average or above average intelligence Capable of independent living Communication skills Financial means General fund of knowledge Motivation for treatment/growth Physical Health Religious Affiliation  PATIENT STRESSORS: Occupational concerns   PROBLEM LIST: Problem List/Patient Goals Date to be addressed Date deferred Reason deferred Estimated date of resolution  Depression 10/28/11   d/c  Anxiety 10/28/11   d/c                                             DISCHARGE CRITERIA:  Ability to meet basic life and health needs Improved stabilization in mood, thinking, and/or behavior Reduction of life-threatening or endangering symptoms to within safe limits  PRELIMINARY DISCHARGE PLAN: Attend aftercare/continuing care group Outpatient therapy Return to previous living arrangement Return to previous work or school arrangements  PATIENT/FAMIILY INVOLVEMENT: This treatment plan has been presented to and reviewed with the patient, JESTER KLINGBERG.  The patient and family have been given the opportunity to ask questions and make suggestions.  Leighton Parody M 10/28/2011, 7:29 PM

## 2011-10-28 NOTE — Progress Notes (Signed)
Patient ID: Eduardo Fuentes, male   DOB: 1950/08/09, 61 y.o.   MRN: [id number] Patient currently denies SI/HI and A/V hallucinations and rates his depression at 8/10 and hopelessness at 8/10; rates anxiety at 9/10; patient was a walk in today and reported that he has been having increasing anxiety and depression; patient says that he is more calm at night and during the day he dreads it because he is so anxious and he has been taking more xanax to help him; patient would not really identify stressors other than his job and says that he does not think it will be an issue anymore because he thinks he has probably lost his job; patient also reported that he had some outpatient therapy in April and it helped some but he needs some  patient was oriented to the unit and belongings have been placed in locker 24

## 2011-10-29 DIAGNOSIS — F332 Major depressive disorder, recurrent severe without psychotic features: Principal | ICD-10-CM

## 2011-10-29 DIAGNOSIS — F4001 Agoraphobia with panic disorder: Secondary | ICD-10-CM | POA: Diagnosis present

## 2011-10-29 DIAGNOSIS — F411 Generalized anxiety disorder: Secondary | ICD-10-CM | POA: Diagnosis present

## 2011-10-29 DIAGNOSIS — F339 Major depressive disorder, recurrent, unspecified: Secondary | ICD-10-CM | POA: Diagnosis present

## 2011-10-29 LAB — COMPREHENSIVE METABOLIC PANEL
ALT: 14 U/L (ref 0–53)
Albumin: 4.1 g/dL (ref 3.5–5.2)
Alkaline Phosphatase: 97 U/L (ref 39–117)
Calcium: 9.7 mg/dL (ref 8.4–10.5)
Potassium: 3.7 mEq/L (ref 3.5–5.1)
Sodium: 139 mEq/L (ref 135–145)
Total Protein: 7.3 g/dL (ref 6.0–8.3)

## 2011-10-29 LAB — URINALYSIS, ROUTINE W REFLEX MICROSCOPIC
Bilirubin Urine: NEGATIVE
Hgb urine dipstick: NEGATIVE
Ketones, ur: NEGATIVE mg/dL
Specific Gravity, Urine: 1.015 (ref 1.005–1.030)
Urobilinogen, UA: 0.2 mg/dL (ref 0.0–1.0)
pH: 7.5 (ref 5.0–8.0)

## 2011-10-29 LAB — CBC
MCH: 33.1 pg (ref 26.0–34.0)
MCHC: 36.2 g/dL — ABNORMAL HIGH (ref 30.0–36.0)
Platelets: 236 10*3/uL (ref 150–400)
RDW: 12.5 % (ref 11.5–15.5)

## 2011-10-29 LAB — RAPID URINE DRUG SCREEN, HOSP PERFORMED
Barbiturates: NOT DETECTED
Benzodiazepines: POSITIVE — AB
Cocaine: NOT DETECTED
Tetrahydrocannabinol: NOT DETECTED

## 2011-10-29 LAB — LIPID PANEL
HDL: 42 mg/dL (ref >39)
LDL Cholesterol: 101 mg/dL — ABNORMAL HIGH (ref 0–99)
Total CHOL/HDL Ratio: 3.9 RATIO
Triglycerides: 94 mg/dL (ref <150)

## 2011-10-29 LAB — T4, FREE: Free T4: 1.51 ng/dL (ref 0.80–1.80)

## 2011-10-29 MED ORDER — HYDROCHLOROTHIAZIDE 12.5 MG PO CAPS
12.5000 mg | ORAL_CAPSULE | Freq: Every day | ORAL | Status: DC
Start: 2011-10-29 — End: 2011-11-06
  Administered 2011-10-29 – 2011-11-06 (×9): 12.5 mg via ORAL
  Filled 2011-10-29 (×10): qty 1

## 2011-10-29 MED ORDER — PAROXETINE HCL 20 MG PO TABS
40.0000 mg | ORAL_TABLET | ORAL | Status: DC
  Administered 2011-10-30 – 2011-11-06 (×8): 40 mg via ORAL
  Filled 2011-10-29 (×9): qty 2

## 2011-10-29 MED ORDER — HYDROXYZINE HCL 50 MG PO TABS
50.0000 mg | ORAL_TABLET | Freq: Four times a day (QID) | ORAL | Status: DC | PRN
  Administered 2011-10-29 – 2011-10-30 (×5): 50 mg via ORAL
  Filled 2011-10-29: qty 1

## 2011-10-29 MED ORDER — QUETIAPINE FUMARATE 25 MG PO TABS
25.0000 mg | ORAL_TABLET | ORAL | Status: DC
  Administered 2011-10-29 – 2011-10-30 (×3): 25 mg via ORAL
  Filled 2011-10-29 (×6): qty 1

## 2011-10-29 NOTE — Tx Team (Signed)
Interdisciplinary Treatment Plan Update (Adult)  Date:  10/29/2011  Time Reviewed:  10:26 AM   Progress in Treatment: Attending groups: Yes Participating in groups:  Yes Taking medication as prescribed: Yes Tolerating medication:  Yes Family/Significant other contact made:  Counselor assessing for appropriate contact. Patient understands diagnosis:  Yes Discussing patient identified problems/goals with staff:  Yes Medical problems stabilized or resolved:  Yes Denies suicidal/homicidal ideation: Yes Issues/concerns per patient self-inventory:  None identified Other: N/A  New problem(s) identified: None Identified  Reason for Continuation of Hospitalization: Anxiety Depression Medication stabilization  Interventions implemented related to continuation of hospitalization: mood stabilization, medication monitoring and adjustment, group therapy and psycho education, safety checks q 15 mins  Additional comments: N/A  Estimated length of stay: 3-5 days  Discharge Plan: SW will assess for appropriate referrals.    New goal(s): N/A  Review of initial/current patient goals per problem list:    1.  Goal(s): Reduce depressive symptoms  Met:  No  Target date: by discharge  As evidenced by: Reducing depression from a 10 to a 3 as reported by pt.   2.  Goal (s): Reduce/Eliminate suicidal ideation  Met:  No  Target date: by discharge  As evidenced by: pt reporting no SI.    3.  Goal(s): Reduce anxiety symptoms  Met:  No  Target date: by discharge  As evidenced by: Reduce anxiety from a 10 to a 3 as reported by pt.    Attendees: Patient:  Eduardo Fuentes 10/29/2011 10:26 AM   Family:     Physician:  Franchot Gallo, MD  10/29/2011  10:26 AM   Nursing:   Chinita Greenland, RN 10/29/2011 10:27 AM   Case Manager:  Reyes Ivan, LCSWA 10/29/2011  10:26 AM   Counselor: Veto Kemps, MT-BC 10/29/2011  10:26 AM   Other:  Clarice Pole, LCASA 10/29/2011  10:26 AM   Other:  Abbie Sons, RN 10/29/2011 10:43 AM   Other:     Other:      Scribe for Treatment Team:

## 2011-10-29 NOTE — Progress Notes (Signed)
[facility] Group Notes: (Counselor/Nursing/MHT/Case Management/Adjunct) 10/29/2011   1:15-2:30pm Emotion Regulation  Type of Therapy:  Group Therapy  Participation Level:  Limited  Participation Quality:  Appropriate, Sharing    Affect:  Blunted  Cognitive:  Appropriate  Insight:  Good  Engagement in Group: Good  Engagement in Therapy:  Good  Modes of Intervention:  Support and Exploration  Summary of Progress/Problems: Eduardo Fuentes was engaged in the group, although he was called out several times. He explored the tendency to relate to negative emotions, and pointed out that in a more "fun" environment, for example at a a picnic, people would be inclined to relate to more positive emotions. This led to exploring the expectations for emotions and how people are less honest about their true feelings than what they think is the appropriate feeling for the situation. Tarry processed his experience of body sensations that accompany anxiety for them.   [name], LCSW 10/29/2011  2:35 PM

## 2011-10-29 NOTE — Progress Notes (Signed)
Pt attended discharge planning group and actively participated in group.  SW provided pt with today's workbook.  Pt presents with anxious mood and affect.  Pt was open with sharing reason for entering the hospital.  Pt states that he is dealing with depression and anxiety.  Pt states that he was in IOP in April 2013 and completed the program.  Pt states that he enjoyed IOP and got a lot out of it and was able to return back to work in July.  Pt states that mid July he started to have depression and anxiety again.  Pt states that he is nervous, anxious and scared and unable to work.  Pt states that he may have to retire due to using all of his FMLA benefits up.  Pt states that he lives in [place] with his wife and has transportation home.  Pt states that his wife has heart problems and he has to help her.  Pt states that he sees Dr. Evelene Croon for medication management and has seen [facility] for therapy before.  SW will secure pt's follow up.  Pt rates depression and anxiety at a 10 today.  Pt denies SI/HI today.  No further needs voiced by pt at this time.    Eduardo Fuentes, LCSWA 10/29/2011  9:58 AM

## 2011-10-29 NOTE — Progress Notes (Signed)
Psychoeducational Group Note  Date:  10/29/2011 Time:  2000  Group Topic/Focus:  AA group  Participation Level:  Active  Participation Quality:  Appropriate  Affect:  Appropriate  Cognitive:  Alert  Insight:  Good  Engagement in Group:  Good  Additional Comments:    Lucila Klecka R 10/29/2011, 10:06 PM

## 2011-10-29 NOTE — Progress Notes (Signed)
D: Patient is cooperative and calm.  Patient in room on approach.  Patient explained to writer that he has been increasingly anxious and so anxious that he has been able to work.  Patient states that it is getting worse.  Patient states he is having passive SI but verbally contracts for safety.  Denies HI and denies AVH.  Patient also states he has had no desire to do anything.   A:  Staff to monitor Q 15 mins for safety.  Support and encouragement offered.  Scheduled medications administered. Xanax administered prn for anxiety and tylenol administered prn for a headache.   R: Patient remains safe on the unit.  Taking administered medications.  Patient calm and cooperative patient did not attend wrap up group. Patient resting with no complains.

## 2011-10-29 NOTE — Progress Notes (Signed)
10/29/2011         Time: 1500      Group Topic/Focus: The focus of this group is on enhancing the patient's understanding of leisure, barriers to leisure, and the importance of engaging in positive leisure activities upon discharge for improved total health.  Participation Level: Active  Participation Quality: Appropriate and Attentive  Affect: Appropriate  Cognitive: Alert   Additional Comments: Patient able to identify leisure interests he can engage in upon discharge.   Talor Cheema 10/29/2011 3:41 PM 

## 2011-10-29 NOTE — [facility] Suicide Risk Assessment (Signed)
Suicide Risk Assessment  Admission Assessment     Nursing information obtained from:  Patient Demographic factors:  Male Current Mental Status:  Married. Loss Factors:  Possible loss of employment. Historical Factors:  History of depression and anxiety x 8 months. Risk Reduction Factors:  Sense of responsibility to family;Employed;Religious beliefs about death;Living with another person, especially a relative;Positive social support  CLINICAL FACTORS:   Severe Anxiety and/or Agitation Panic Attacks Depression:   Anhedonia Hopelessness Insomnia Severe More than one psychiatric diagnosis Previous Psychiatric Diagnoses and Treatments Medical Diagnoses and Treatments/Surgeries  COGNITIVE FEATURES THAT CONTRIBUTE TO RISK:  None Noted.   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   Generalized Anxiety Disorder.  Panic Disorder with Agoraphobia. AXIS II: Deferred.  AXIS III:  1. Hypertension.   2. Seasonal Allergies.    3. Tinnitus. AXIS IV: Chronic Mental Illness. Occupational Issues.  AXIS V: GAF at time of admission approximately 45.  Highest GAF in the last year approximately 60.  The patient was seen today and reports the following:   ADL's: Good.  Sleep: The patient reports to having difficulty maintaining sleep.  He states he sleeps 4-5 hours a night at most. Appetite: The patient reports a decreased appetite today.   Mild>(1-10) >Severe  Hopelessness (1-10): 10  Depression (1-10): 10  Anxiety (1-10): 10   Suicidal Ideation: The patient denies any current suicidal ideations today.  Plan: No  Intent: No  Means: No  Homicidal Ideation: The patient denies any current homicidal ideations today.  Plan: No  Intent: No.  Means: No   General Appearance/Behavior: The patient was cooperative today with this provider but appeared mildly anxious.  Eye Contact: Good.  Speech: Appropriate in rate and volume  with no pressuring noted.  Motor Behavior: wnl.  Level of Consciousness: Alert and Oriented x 3.  Mental Status: Alert and Oriented x 3.  Mood: Depressed - Appears severely depressed.  Affect: Appears moderately anxious.  Anxiety Level: Severe anxiety reported today.  Thought Process: wnl  Thought Content: The patient denies any auditory or visual hallucinations today as well as any delusional thinking.  Perception: wnl.  Judgment: Fair to Good.  Insight: Fair to Good.  Cognition: Oriented to person, place and time.   Current Medications:  fluticasone  2 spray Each Nare Daily  hydrochlorothiazide  12.5 mg Oral Daily  losartan  50 mg Oral Daily  PARoxetine  40 mg Oral BH-q7a  QUEtiapine  25 mg Oral [id number]  venlafaxine XR  37.5 mg Oral Q breakfast   Review of Systems:  Neurological: The patient denies any headaches today. He denies any seizures or dizziness.  G.I.: The patient denies any constipation today or G.I. Upset.  Musculoskeletal: The patient denies any muscle or skeletal difficulties today.   Time was spent today discussing with the patient his current symptoms.  The patient states that he is having difficulty maintaining sleep and is sleeping approximately 4-5 hours each night. He reports a decreased appetite and reports severe feelings of sadness, anhedonia and depressed mood.  He denies any current suicidal ideations but reports suicidal ideations prior to admission.  The patient states he would never harm himself due to not wanting to hurt his wife or his dog.  The patient denies any auditory or visual hallucinations or delusional thinking btu does report severe anxiety symptoms with frequent panic episodes.  The patient presents today for evaluation and treatment of the above  symptoms.  Treatment Plan Summary:  1. Daily contact with patient to assess and evaluate symptoms and progress in treatment  2. Medication management  3. The patient will deny suicidal ideations  or homicidal ideations for 48 hours prior to discharge and have a depression and anxiety rating of 3 or less. The patient will also deny any auditory or visual hallucinations or delusional thinking and display no manic or hypomanic behaviors.  4. The patient will deny any symptoms of substance withdrawal at time of discharge.   Plan:  1. Will continue the patient on the medication Paxil at 40 mgs po q am for depression and anxiety. 2. Will start the patient on Effexor XR at 37.5 mgs po q am to also address the patient's depressive symptoms. 3. Will start the patient on the medication Seroquel at 25 mgs po a 8 am, 2 pm and 10 pm for anxiety, panic and mood stabilization. 4. Laboratory Studies reviewed.  5. Will continue to monitor.   SUICIDE RISK:  Mild:  Suicidal ideation of limited frequency, intensity, duration, and specificity.  There are no identifiable plans, no associated intent, mild dysphoria and related symptoms, good self-control (both objective and subjective assessment), few other risk factors, and identifiable protective factors, including available and accessible social support.  Lylah Lantis 10/29/2011, 1:33 PM

## 2011-10-29 NOTE — Progress Notes (Signed)
D: Patient in room on approach.  Patient states there is nothing to do here.  Patient isolates in his room.  Patient states he does not like to watch TV and he feels there is too much down time here.  Patient has complained of a headache tonight and anxiety.  Patient states his anxiety level is high.  Patient states that xanax works well for him but was instructed by Clinical research associate that he would not receive xanax here at [facility] unless it was prescribed.  Patient states that he thinks the medication  Seroquel had him down today and he was unable to talk to his wife when she visited because of it.  Patient denies SI/HI and denies AVH.  A: Staff to monitor Q 15 monitor for safety.  Scheduled medication administered per orders.  Patient offered encouragement and support.  Acetaminophen administered prn for a headache and vistaril administered prn for anxiety at bedtime. R:  Patient remains safe on the unit.  Patient did not attend wrap up group but he waited for group to be over dso he could get a bedtime snack.  Patient appears to be displaying med seeking behaviors.

## 2011-10-29 NOTE — Progress Notes (Signed)
  D) Patient pleasant and cooperative upon my assessment. Patient states slept "fair," and  appetite is "improving." Patient complains of increased anxiety, state he "feels like this every morning."  Patient rates depression as  1 /10, patient rates hopeless feelings as  2/10. Patient denies SI/HI, denies A/V hallucinations.   A) Patient offered support and encouragement, patient encouraged to discuss feelings/concerns with staff. Patient verbalized understanding. Patient monitored Q15 minutes for safety. Patient met with MD tto discuss today's goals and plan of care.  R) Patient active on unit, attending groups in day room and meals in dining room.  Patient taking medications as ordered. Will continue to monitor.

## 2011-10-29 NOTE — H&P (Signed)
Psychiatric Admission Assessment Adult  Patient Identification:  Eduardo Fuentes Date of Evaluation:  10/29/2011 Chief Complaint:  "I was having suicidal thoughts x 1 week" History of Present Illness:: Pt presented to [facility] on voluntary admission status in response to a 1 week history of suicidal ideation.  Pt states that although he has suicidal thoughts, his concern regarding how his suicide would negatively impact his wife, concern for his pet dog, and "too many positive things in my life" keeps him from moving from fleeting thoughts of suicide to developing an actual plan.  Pt admonishes anxiety and "panicky feeling" waking him daily and causing nausea.  He states that he feels "hopeless" as he feels that his depressive and anxiety symptoms will not improve.  He has feelings of guilt regarding his current psychiatric state of mind.  Mood Symptoms:  Anhedonia, Appetite decreased, Depression, Hopelessness, Sadness, SI, Depression Symptoms:  anhedonia, hopelessness, suicidal thoughts without plan, anxiety, panic attacks,  Isolation Crying episodes (Hypo) Manic Symptoms:  none Anxiety Symptoms:  Nervousness, stomaches, nausea, general anxiety, shakiness, biting knuckles. Psychotic Symptoms:  none  PTSD Symptoms: none   Past Psychiatric History:  Diagnosis: Anxiety/Depression- Dr. Evelene Croon  Hospitalizations: none  Outpatient Care: Dr. Evelene Croon outpatient psychiatrist; CDIOP- Jan 2013 began outpatient therapy  Substance Abuse Care: none reported  Self-Mutilation: Patient denies  Suicidal Attempts: Patient denies attempt, but admit thoughts  Violent Behaviors: Patient denies   Past Medical History:  History of tonsillectomy as a child. Past Medical History  Diagnosis Date  . Tinnitus   . Hypertension     Allergies:  No Known Allergies PTA Medications: Prescriptions prior to admission  Medication Sig Dispense Refill  . acetaminophen (TYLENOL) 500 MG tablet Take 1,000 mg by mouth  every 6 (six) hours as needed. pain      . ALPRAZolam (XANAX) 0.5 MG tablet Take 0.5 mg by mouth 3 (three) times daily as needed. Anxiety      . hydrochlorothiazide (HYDRODIURIL) 12.5 MG tablet Take 12.5 mg by mouth daily.      Marland Kitchen losartan (COZAAR) 50 MG tablet Take 50 mg by mouth daily.      Marland Kitchen PARoxetine (PAXIL) 30 MG tablet Take 30 mg by mouth every morning.      . risperiDONE (RISPERDAL) 1 MG tablet Take 1 tablet (1 mg total) by mouth at bedtime.  30 tablet  0  . desvenlafaxine (PRISTIQ) 50 MG 24 hr tablet Take 50 mg by mouth daily.      . fluticasone (FLONASE) 50 MCG/ACT nasal spray Place 2 sprays into the nose daily.  16 g  0    Substance Abuse History in the last 12 months: NA Substance Age of 1st Use Last Use Amount Specific Type  Nicotine Denies use     Alcohol na     Cannabis na     Opiates na     Cocaine na     Methamphetamines na     LSD na     Ecstasy na     Benzodiazepines Jan 2013 Sept 3, 2013 Takes 4-5 doses of 0.5 mg daily   Caffeine      Inhalants na     Others:                         Consequences of Substance Abuse: Medical consequences of abuse- Risk of addiction, liver damage Legal consequences of abuse-Arrest, Jail time, loss of driving privileges Family consequences of abuse- Family discord/issues.  Social History: Current Place of Residence:  [place], [place]  Place of Birth:    Family Members: Spouse & sister  Marital Status:  Married  Children:none    Relationships:  Education:  [employer] Problems/Performance: none. HS graduate Religious Beliefs/Practices: not applicable History of Abuse (Emotional/Phsycial/Sexual) Denies Occupational Experiences; Military History:  None. Legal History: None Hobbies/Interests: Spending time with friends  Family Psych History:  Mother- Anxiety Family History  Problem Relation Age of Onset  . Dementia Mother    Heart disease Mother/Father    Prostate CA/Bone CA Father    [id number]  complaints of frontal headache and Left ear tinnitus, and sxs noted in HPI.  Physical Exam:  BP 124/87  Pulse 84  Temp 97.7 F (36.5 C) (Oral)  Resp 18  Ht 5' 8.1" (1.73 m)  Wt 88.905 kg (196 lb)  BMI 29.71 kg/m2  SpO2 100%  General Appearance:  Alert, cooperative, no distress, appears stated age  Head:  Normocephalic, without obvious abnormality, atraumatic  Eyes:  Conjunctiva pink, moist without discharge; Corneas clear bilaterally.  Ears:  Normal TM's and external ear canals, both ears  Nose: Nares normal, septum midline, mucosa normal, no drainage or sinus tenderness  Throat: Lips without sores. Tongue/ uvula midline; Mucous membranes moist, pink. Noted multiple metal fillings; Gums pink.  Neck: Supple, symmetrical, trachea midline, no adenopathy, thyroid: without tenderness. Negative for nodules or mass.  Back:   Normal curvature; ROM normal  Lungs:   Lungs clear to auscultation throughout left and right lung fields. Respirations unlabored, even  Chest Wall:  Negative tenderness or deformity  Heart:  Regular rate and rhythm, S1, S2 normal, no murmur, rub or gallop auscultated. DP, radial pulses 2+ and symmetric.  Abdomen:   Bowel sounds active all four quadrants. Negative masses Neg  Organomegaly.  Genitalia:  deferred  Rectal:  deferred  Extremities: Extremities normal, atraumatic, no cyanosis, clubbing, or edema     Skin: Skin dry.  Color, texture, turgor normal. No rashes or lesions noted. Skin to proximal joint of 3rd digit noted scar tissue bilaterally.   Lymph nodes: Cervical, supraclavicular, and axillary nodes normal  Neurologic: PERRLA; CN II-XII appear intact. EOM intact   Mental Status Examination/Evaluation: Objective:  Appearance: Neat and Well Groomed  Eye Contact::  Good  Speech:  Clear and Coherent and Normal Rate  Volume:  Normal  Mood:  Anxious and Depressed  Affect:  Depressed  Thought Process:  Goal Directed, Intact and Logical  Orientation:  Full;  Ox4  Thought Content:  WNL; Negative for A/V hallucinations  Suicidal Thoughts:  Yes, fleeting. Contracts for safety.  Homicidal Thoughts:  No  Memory:  Immediate;   Good  Judgement:  Fair  Insight:  Fair  Psychomotor Activity:  Normal  Concentration:  Good  Recall:  Good  Akathisia:  No  Handed:  Right  AIMS (if indicated):     Assets:  Communication Skills Desire for Improvement Financial Resources/Insurance Housing Physical Health  Sleep:  Number of Hours: 6.25     Laboratory/X-Ray Psychological Evaluation(s)  Note GFR low at 76; glucose elevated at 101 Hemoglobin elevated at 17.5    Assessment:    AXIS I:  Depressive Disorder NOS, Generalized Anxiety Disorder and Rule out Substance dependence AXIS II:  Deferred AXIS III:   Past Medical History  Diagnosis Date  . Tinnitus   . Hypertension    AXIS IV:  occupational problems and Medical problems AXIS V:  41-50 serious symptoms  Treatment  Plan/Recommendations: 1. Admit for crisis management and stabilization. 2. Medication management to reduce current symptoms to baseline and assist with improvement of the  patient's overall level of functioning 3. Treat health problems as indicated. 4. Develop treatment plan to decrease risk of relapse upon discharge and the need for readmission. 5. Psycho-social education regarding relapse prevention and self care.  Therapeutic milieu. 6. Health care follow up as needed for medical problems. 7. Restart home medications where appropriate.  Treatment Plan Summary: Daily contact with patient to assess and evaluate symptoms and progress in treatment  Current Medications:  Current Facility-Administered Medications  Medication Dose Route Frequency Provider Last Rate Last Dose  . acetaminophen (TYLENOL) tablet 1,000 mg  1,000 mg Oral Q6H PRN Kerry Hough, PA   650 mg at 10/29/11 1249  . ALPRAZolam Prudy Feeler) tablet 0.5 mg  0.5 mg Oral TID PRN Kerry Hough, PA   0.5 mg at 10/28/11  2144  . alum & mag hydroxide-simeth (MAALOX/MYLANTA) 200-200-20 MG/5ML suspension 30 mL  30 mL Oral Q4H PRN Kerry Hough, PA      . fluticasone (FLONASE) 50 MCG/ACT nasal spray 2 spray  2 spray Each Nare Daily Kerry Hough, PA      . hydrochlorothiazide (MICROZIDE) capsule 12.5 mg  12.5 mg Oral Daily Mike Craze, MD   12.5 mg at 10/29/11 0759  . hydrOXYzine (ATARAX/VISTARIL) tablet 50 mg  50 mg Oral Q6H PRN Mike Craze, MD   50 mg at 10/29/11 0913  . losartan (COZAAR) tablet 50 mg  50 mg Oral Daily Kerry Hough, PA   50 mg at 10/29/11 0759  . magnesium hydroxide (MILK OF MAGNESIA) suspension 30 mL  30 mL Oral Daily PRN Kerry Hough, PA      . PARoxetine (PAXIL) tablet 30 mg  30 mg Oral BH-q7a Kerry Hough, PA   30 mg at 10/29/11 1610  . risperiDONE (RISPERDAL) tablet 1 mg  1 mg Oral QHS Kerry Hough, PA   1 mg at 10/28/11 2144  . venlafaxine XR (EFFEXOR-XR) 24 hr capsule 37.5 mg  37.5 mg Oral Q breakfast Kerry Hough, PA      . DISCONTD: acetaminophen (TYLENOL) tablet 650 mg  650 mg Oral Q6H PRN Kerry Hough, PA      . DISCONTD: hydrochlorothiazide (HYDRODIURIL) tablet 12.5 mg  12.5 mg Oral Daily Kerry Hough, PA      . DISCONTD: nicotine (NICODERM CQ - dosed in mg/24 hours) patch 21 mg  21 mg Transdermal Q0600 Kerry Hough, PA      . DISCONTD: traZODone (DESYREL) tablet 50 mg  50 mg Oral QHS,MR X 1 Kerry Hough, PA        Observation Level/Precautions:  Q 15 min checks for safety  Laboratory: Reviewed and noted.  Psychotherapy:  Group sessions  Medications:  See med list  Routine PRN Medications: Yes  Consultations:  None indicated at this time  Discharge Concerns:  Safety, stablization, medication compliance  Other:     Norval Gable 9/4/201312:56 PM

## 2011-10-30 MED ORDER — QUETIAPINE FUMARATE 25 MG PO TABS: 25.0000 mg | ORAL_TABLET | Freq: Three times a day (TID) | ORAL | Status: DC | PRN

## 2011-10-30 MED ORDER — QUETIAPINE FUMARATE 50 MG PO TABS
50.0000 mg | ORAL_TABLET | ORAL | Status: DC
  Administered 2011-10-30: 50 mg via ORAL
  Filled 2011-10-30 (×5): qty 1

## 2011-10-30 MED ORDER — CLONAZEPAM 0.5 MG PO TABS
0.5000 mg | ORAL_TABLET | ORAL | Status: DC
  Administered 2011-10-30 – 2011-11-01 (×5): 0.5 mg via ORAL
  Filled 2011-10-30 (×7): qty 1

## 2011-10-30 MED ORDER — QUETIAPINE FUMARATE 25 MG PO TABS
25.0000 mg | ORAL_TABLET | ORAL | Status: DC
  Administered 2011-10-30 – 2011-11-02 (×8): 25 mg via ORAL
  Filled 2011-10-30 (×16): qty 1

## 2011-10-30 MED ORDER — CLONAZEPAM 0.5 MG PO TABS
0.5000 mg | ORAL_TABLET | ORAL | Status: AC
  Administered 2011-10-30: 0.5 mg via ORAL
  Filled 2011-10-30: qty 1

## 2011-10-30 MED ORDER — QUETIAPINE FUMARATE 25 MG PO TABS
25.0000 mg | ORAL_TABLET | Freq: Three times a day (TID) | ORAL | Status: DC | PRN
  Administered 2011-10-31 – 2011-11-06 (×5): 25 mg via ORAL
  Filled 2011-10-30 (×4): qty 1

## 2011-10-30 NOTE — Progress Notes (Signed)
[facility] Group Notes: (Counselor/Nursing/MHT/Case Management/Adjunct) 10/30/2011   @1 :15pm [facility] in [facility]  Type of Therapy:  Group Therapy  Participation Level:  Good  Participation Quality:  Good  Affect:  Appropriate  Cognitive:  Appropriate  Insight:  Good  Engagement in Group:  Good  Engagement in Therapy:  Good  Modes of Intervention:  Support and Exploration  Summary of Progress/Problems: Eduardo Fuentes participated with speaker from [facility] [place] and expressed interest in programs [facility] offers.    [name] 10/30/2011  3:09 PM

## 2011-10-30 NOTE — Progress Notes (Signed)
Pt attended discharge planning group and actively participated in group.  SW provided pt with today's workbook.  Pt presents with anxious mood and affect.  Pt rates depression and anxiety at a 9 today.  Pt denies SI today.  Pt states that he is not good today due to anxiety and depression.  Pt states that he has tough mornings dealing with anxiety and has panic attacks.  Pt states that he doesn't feel the meds are working.  SW secured pt's follow up at [facility] for medication management and therapy.  No further needs voiced by pt at this time.  Safety planning and suicide prevention discussed.  Pt participated in discussion and acknowledged an understanding of the information provided.       Reyes Ivan, LCSWA 10/30/2011  9:16 AM

## 2011-10-30 NOTE — Progress Notes (Signed)
D:  Patient up and in the milieu today.  Has attended groups and been out of his room more.  He does not extremely high anxiety and states that the hydroxyzine does not make him feel any better, and may in fact cause him to have headaches.  Discussed with Dr. Allena Katz.  He continues to ask about Xanax.   A:  It was decided to increase his Seroquel and discontinue the hydroxyzine.  Patient was informed of this and is agreeable to the plan.  Will monitor as changes are made.  R:  Pleasant and cooperative with staff.  Continues to try to get anxiety under control.  Minimally interactive, but is attending and participating in groups.

## 2011-10-30 NOTE — Progress Notes (Signed)
Psychoeducational Group Note  Date:  10/30/2011 Time:  2000 Group Topic/Focus:  karaoke group  Participation Level:  Active  Participation Quality:  Appropriate  Affect:  Appropriate  Cognitive:  Appropriate  Insight:  Good  Engagement in Group:  Good  Additional Comments:    Gwenevere Ghazi Patience 10/30/2011, 10:20 PM

## 2011-10-30 NOTE — Progress Notes (Signed)
D: Patient anxious tonight but states he is calmer and he has not had a panic attack since before dinner time.  Patient Patient states he has high anxiety.  Patient denis SI/HI and denies AVH.  Patient states he believes his medications have started to work because when his wife visited him today he was more talkative with her.    A: Staff to monitor Q 15 mins for safety.  Support and encouragement offered. Scheduled medications administered and tylenol administered prn for a headache.  R: Patient remains safe on the unit.  Patient remains anxious but calm.  Patient attended [facility] but came back early because he states he no longer wanted to be in the cafeteria.  Patient taking administered medications.  Patient in room resting with eyes closed when writer reassessed pain.

## 2011-10-30 NOTE — [facility] Counselor (Signed)
Adult Comprehensive Assessment  Patient ID: Eduardo Fuentes, male   DOB: 1950-08-24, 61 y.o.   MRN: [id number]  Information Source: Information source: Patient  Current Stressors:  Educational / Learning stressors: no stressors reported Employment / Job issues: panics whenever he has to go to work Family Relationships: no stressors reported Surveyor, quantity / Lack of resources (include bankruptcy): worried that he may have to retire early, meaning with less money Housing / Lack of housing: no stressors reported Physical health (include injuries & life threatening diseases): DVT, non-cancerous polyps, high blood pressure Social relationships: no stressors reported Substance abuse: no stressors reported Bereavement / Loss: loss of sense of self - "I would give all my hobbies just to be myself again"  Living/Environment/Situation:  Living Arrangements: Spouse/significant other Living conditions (as described by patient or guardian):   How long has patient lived in current situation?: 40 years What is atmosphere in current home: Comfortable;Supportive  Family History:  Number of Years Married: 71  What types of issues is patient dealing with in the relationship?: wife is sick - does not want to leave her to get treatment but wants to get better so he can help take care of her Does patient have children?: No  Childhood History:  By whom was/is the patient raised?: Both parents Additional childhood history information: good childhood; family history of anxiety - sister and mother both experienced anxiety and  panic, but not debilitating Description of patient's relationship with caregiver when they were a child: close Patient's description of current relationship with people who raised him/her: deceased Does patient have siblings?: Yes Number of Siblings: 1  Description of patient's current relationship with siblings: close with sister Did patient suffer any verbal/emotional/physical/sexual  abuse as a child?: No Did patient suffer from severe childhood neglect?: No Has patient ever been sexually abused/assaulted/raped as an adolescent or adult?: No Was the patient ever a victim of a crime or a disaster?: No Witnessed domestic violence?: No Has patient been effected by domestic violence as an adult?: No  Education:  Highest grade of school patient has completed: high school graduate Currently a Consulting civil engineer?: No Learning disability?: No  Employment/Work Situation:   Employment situation: Leave of absence 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 How long has patient been employed?: 20 years Patient's job has been impacted by current illness: Yes Describe how patient's job has been implacted: had to take leaves of absence, may retire What is the longest time patient has a held a job?: 20 years Where was the patient employed at that time?: current job - Scientist, research (physical sciences) Has patient ever been in the [employer]?: No Has patient ever served in combat?: No  Financial Resources:   Financial resources: Income from employment;Income from spouse Does patient have a representative payee or guardian?: No  Alcohol/Substance Abuse:   What has been your use of drugs/alcohol within the last 12 months?: no substance use reported If attempted suicide, did drugs/alcohol play a role in this?: No Alcohol/Substance Abuse Treatment Hx: Denies past history Has alcohol/substance abuse ever caused legal problems?: No  Social Support System:   Patient's Community Support System: Good Describe Community Support System: wife, sister, friends and work people, therapeutic relationships with psychiatrist and therapist Type of faith/religion: Ephriam Knuckles How does patient's faith help to cope with current illness?: finds strength in God, scriptures, tries to have faith  Leisure/Recreation:   Leisure and Hobbies: golfing, "I have lots of hobbies but I'd give them all up just to feel like myself  again."  Strengths/Needs:   What things does the patient do well?: strong sense of self, good work ethic In what areas does patient struggle / problems for patient: anxiety and depression since January; wife has health problems, got very nervous when went back to work - woke up second day with panic attacks;  Discharge Plan:   Does patient have access to transportation?: Yes Will patient be returning to same living situation after discharge?: Yes Currently receiving community mental health services: Yes (From Whom) (Dr. Evelene Croon and Katherina Mires) If no, would patient like referral for services when discharged?: No Does patient have financial barriers related to discharge medications?: Yes Patient description of barriers related to discharge medications: out of work on leave of absence  Summary/Recommendations:   Summary and Recommendations (to be completed by the evaluator): Eduardo Fuentes is a 61 year old married male diagnosed with Panic Disorder. He reports that he has been experiencing anxiety since January, and that it has increased to the point that he is unable to work or function. Currently spends most of his time at home becuase he panics when he is around groups of people. Has taken 2  leaves of absence from work, when he tried to go back after the first 6 months of absence, he began having panic attacks again on the second day and by the second week Dr. Evelene Croon had written him out for another 6 weeks. At this point he does not think he will be able to return to work, and will retire early although this means he will get less retirement pay. He is now worrried about finances, as his wife has already had to retire due to medical problems. He also feels guilty for not being able to care for her like he should due to his depression and anxiety. Eduardo Fuentes woulld benefit from crisis stabilization, medication evaluation, therapy groups for processing  thoughs/feelings/experiences, psychoed groups for coping skills,  and case management for discharge planning.   Eduardo Fuentes, Eduardo Fuentes. 10/30/2011

## 2011-10-30 NOTE — Progress Notes (Signed)
[facility] MD Progress Note  10/30/2011 1:02 PM  S: "My anxiety is very high this morning. I don't really know why. I guess that I am worried that I don't have Xanax pills any more. Vistaril is not working for me, I believe that it is giving me headaches. I started on both Seroquel and Effexor yesterday. How long should I wait to see if Effexor made any difference in my depression?   Diagnosis:   Axis I: Panic disorder with agoraphobia, Major depressive disorder, recurrent epiosde, Generalized anxiety. Axis II: Deferred Axis III:  Past Medical History  Diagnosis Date  . Tinnitus   . Depression   . Anxiety   . Diverticulosis   . Colon polyps   . Tinnitus   . High blood pressure    Axis IV: other psychosocial or environmental problems Axis V: 41-50 serious symptoms  ADL's:  Intact  Sleep: Fair  Appetite:  Good  Suicidal Ideation: "No" Plan:  No Intent:  No Means:  no Homicidal Ideation:  Plan:  No Intent:  no Means:  no  AEB (as evidenced by): Per patient's reports. Mental Status Examination/Evaluation: Objective:  Appearance: Casual  Eye Contact::  Good  Speech:  Clear and Coherent  Volume:  Normal  Mood:  Anxious  Affect:  Flat  Thought Process:  Coherent and Intact  Orientation:  Full  Thought Content:  Rumination  Suicidal Thoughts:  No  Homicidal Thoughts:  No  Memory:  Immediate;   Good Recent;   Good Remote;   Good  Judgement:  Good  Insight:  Good  Psychomotor Activity:  Restlessness  Concentration:  Fair  Recall:  Good  Akathisia:  No  Handed:  Right  AIMS (if indicated):     Assets:  Desire for Improvement  Sleep:  Number of Hours: 5.5    Vital Signs:Blood pressure 122/82, pulse 90, temperature 98 F (36.7 C), temperature source Oral, resp. rate 16, height 5' 8.1" (1.73 m), weight 88.905 kg (196 lb), SpO2 100.00%. Current Medications: Current Facility-Administered Medications  Medication Dose Route Frequency Provider Last Rate Last Dose  .  acetaminophen (TYLENOL) tablet 1,000 mg  1,000 mg Oral Q6H PRN Kerry Hough, PA   1,000 mg at 10/30/11 0645  . alum & mag hydroxide-simeth (MAALOX/MYLANTA) 200-200-20 MG/5ML suspension 30 mL  30 mL Oral Q4H PRN Kerry Hough, PA      . hydrochlorothiazide (MICROZIDE) capsule 12.5 mg  12.5 mg Oral Daily Curlene Labrum Readling, MD   12.5 mg at 10/30/11 0755  . losartan (COZAAR) tablet 50 mg  50 mg Oral Daily Curlene Labrum Readling, MD   50 mg at 10/30/11 0755  . magnesium hydroxide (MILK OF MAGNESIA) suspension 30 mL  30 mL Oral Daily PRN Kerry Hough, PA      . PARoxetine (PAXIL) tablet 40 mg  40 mg Oral BH-q7a Curlene Labrum Readling, MD   40 mg at 10/30/11 0609  . QUEtiapine (SEROQUEL) tablet 25 mg  25 mg Oral Q8H PRN Sanjuana Kava, NP      . QUEtiapine (SEROQUEL) tablet 50 mg  50 mg Oral [id number] Curlene Labrum Readling, MD      . venlafaxine XR (EFFEXOR-XR) 24 hr capsule 37.5 mg  37.5 mg Oral Q breakfast Curlene Labrum Readling, MD   37.5 mg at 10/30/11 0755  . DISCONTD: ALPRAZolam Prudy Feeler) tablet 0.5 mg  0.5 mg Oral TID PRN Kerry Hough, PA   0.5 mg at 10/28/11 2144  . DISCONTD: fluticasone (  FLONASE) 50 MCG/ACT nasal spray 2 spray  2 spray Each Nare Daily Curlene Labrum Readling, MD      . DISCONTD: hydrOXYzine (ATARAX/VISTARIL) tablet 50 mg  50 mg Oral Q6H PRN Mike Craze, MD   50 mg at 10/30/11 1140  . DISCONTD: PARoxetine (PAXIL) tablet 30 mg  30 mg Oral BH-q7a Kerry Hough, PA   30 mg at 10/29/11 1610  . DISCONTD: QUEtiapine (SEROQUEL) tablet 25 mg  25 mg Oral BH-[id number] Curlene Labrum Readling, MD   25 mg at 10/30/11 0754  . DISCONTD: QUEtiapine (SEROQUEL) tablet 25 mg  25 mg Oral Q8H PRN Ronny Bacon, MD      . DISCONTD: risperiDONE (RISPERDAL) tablet 1 mg  1 mg Oral QHS Kerry Hough, PA   1 mg at 10/28/11 2144    Lab Results:  Results for orders placed during the hospital encounter of 10/28/11 (from the past 48 hour(s))  URINALYSIS, ROUTINE W REFLEX MICROSCOPIC     Status: Normal   Collection Time    10/28/11  9:18 PM      Component Value Range Comment   Color, Urine YELLOW  YELLOW    APPearance CLEAR  CLEAR    Specific Gravity, Urine 1.015  1.005 - 1.030    pH 7.5  5.0 - 8.0    Glucose, UA NEGATIVE  NEGATIVE mg/dL    Hgb urine dipstick NEGATIVE  NEGATIVE    Bilirubin Urine NEGATIVE  NEGATIVE    Ketones, ur NEGATIVE  NEGATIVE mg/dL    Protein, ur NEGATIVE  NEGATIVE mg/dL    Urobilinogen, UA 0.2  0.0 - 1.0 mg/dL    Nitrite NEGATIVE  NEGATIVE    Leukocytes, UA NEGATIVE  NEGATIVE MICROSCOPIC NOT DONE ON URINES WITH NEGATIVE PROTEIN, BLOOD, LEUKOCYTES, NITRITE, OR GLUCOSE <1000 mg/dL.  URINE RAPID DRUG SCREEN (HOSP PERFORMED)     Status: Abnormal   Collection Time   10/28/11  9:18 PM      Component Value Range Comment   Opiates NONE DETECTED  NONE DETECTED    Cocaine NONE DETECTED  NONE DETECTED    Benzodiazepines POSITIVE (*) NONE DETECTED    Amphetamines NONE DETECTED  NONE DETECTED    Tetrahydrocannabinol NONE DETECTED  NONE DETECTED    Barbiturates NONE DETECTED  NONE DETECTED   CBC     Status: Abnormal   Collection Time   10/29/11  6:55 AM      Component Value Range Comment   WBC 6.7  4.0 - 10.5 K/uL    RBC 5.28  4.22 - 5.81 MIL/uL    Hemoglobin 17.5 (*) 13.0 - 17.0 g/dL    HCT 96.0  45.4 - 09.8 %    MCV 91.5  78.0 - 100.0 fL    MCH 33.1  26.0 - 34.0 pg    MCHC 36.2 (*) 30.0 - 36.0 g/dL    RDW 11.9  14.7 - 82.9 %    Platelets 236  150 - 400 K/uL   COMPREHENSIVE METABOLIC PANEL     Status: Abnormal   Collection Time   10/29/11  6:55 AM      Component Value Range Comment   Sodium 139  135 - 145 mEq/L    Potassium 3.7  3.5 - 5.1 mEq/L    Chloride 98  96 - 112 mEq/L    CO2 31  19 - 32 mEq/L    Glucose, Bld 101 (*) 70 - 99 mg/dL    BUN 12  6 -  23 mg/dL    Creatinine, Ser 1.47  0.50 - 1.35 mg/dL    Calcium 9.7  8.4 - 82.9 mg/dL    Total Protein 7.3  6.0 - 8.3 g/dL    Albumin 4.1  3.5 - 5.2 g/dL    AST 17  0 - 37 U/L    ALT 14  0 - 53 U/L    Alkaline Phosphatase 97  39 -  117 U/L    Total Bilirubin 0.9  0.3 - 1.2 mg/dL    GFR calc non Af Amer 76 (*) >90 mL/min    GFR calc Af Amer 88 (*) >90 mL/min   MAGNESIUM     Status: Normal   Collection Time   10/29/11  6:55 AM      Component Value Range Comment   Magnesium 2.0  1.5 - 2.5 mg/dL   LIPID PANEL     Status: Abnormal   Collection Time   10/29/11  6:55 AM      Component Value Range Comment   Cholesterol 162  0 - 200 mg/dL    Triglycerides 94  <562 mg/dL    HDL 42  >13 mg/dL    Total CHOL/HDL Ratio 3.9      VLDL 19  0 - 40 mg/dL    LDL Cholesterol 086 (*) 0 - 99 mg/dL   TSH     Status: Normal   Collection Time   10/29/11  6:55 AM      Component Value Range Comment   TSH 1.730  0.350 - 4.500 uIU/mL   T4, FREE     Status: Normal   Collection Time   10/29/11  6:55 AM      Component Value Range Comment   Free T4 1.51  0.80 - 1.80 ng/dL   BILIRUBIN, DIRECT     Status: Normal   Collection Time   10/29/11  6:55 AM      Component Value Range Comment   Bilirubin, Direct 0.1  0.0 - 0.3 mg/dL     Physical Findings: AIMS:  , ,  ,  ,    CIWA:    COWS:     Treatment Plan Summary: Daily contact with patient to assess and evaluate symptoms and progress in treatment Medication management  Plan: Informed and explained to patient that it can take up to 4-6 weeks to  Notice the effectiveness of most antidepressants.  Increased Seroquel from 25 mg tid to 50 mg tid for anxiety symptoms. Continue current treatment plan.  Armandina Stammer I 10/30/2011, 1:02 PM

## 2011-10-31 NOTE — Progress Notes (Signed)
I agree with this note.  

## 2011-10-31 NOTE — Progress Notes (Signed)
[facility] Group Notes:  (Counselor/Nursing/MHT/Case Management/Adjunct) 10/31/2011  1:15pm-2:30pm Empowerment in Relapse Prevention   Type of Therapy:  Group Therapy  Participation Level:  Did Not Attend     Regina Alexander, LCSW 10/31/2011  2:49 PM 

## 2011-10-31 NOTE — Progress Notes (Signed)
[facility] Adult Inpatient Family/Significant Other Suicide Prevention Education  Suicide Prevention Education:  Education Completed; Wife, Eduardo Fuentes [phone])  has been identified by the patient as the family member/significant other with whom the patient will be residing, and identified as the person(s) who will aid the patient in the event of a mental health crisis (suicidal ideations/suicide attempt).  With written consent from the patient, the family member/significant other has been provided the following suicide prevention education, prior to the and/or following the discharge of the patient.  The suicide prevention education provided includes the following:  Suicide risk factors  Suicide prevention and interventions  National Suicide Hotline telephone number  [facility] assessment telephone number  [facility] Emergency Assistance 911  [facility] and/or Residential Mobile Crisis Unit telephone number  Request made of family/significant other to:  Remove weapons (e.g., guns, rifles, knives), all items previously/currently identified as safety concern.    Remove drugs/medications (over-the-counter, prescriptions, illicit drugs), all items previously/currently identified as a safety concern.  Eduardo Fuentes reported that she just wants Eduardo Fuentes to get back to himself, but states that he will not talk to her. She indicated that he is eating better and that the new medication last night seemed to be very helpful.he mentioned that he has a long family history of anxiety, and that he had a sister die from a brain tumor as a child and can remember riding up and down the elevator to the morgue to see her body.  Eduardo Fuentes verbalized understanding of suicide prevention information and had no further questions. She stated that there are no guns in the home or that Eduardo Fuentes has access to.   Eduardo Fuentes 10/31/2011, 1:09 PM

## 2011-10-31 NOTE — Progress Notes (Signed)
[facility] Adult Inpatient Family/Significant Other Suicide Prevention Education  Suicide Prevention Education:  Contact Attempts: Wife, Alvis Lemmings [phone]) has been identified by the patient as the family member/significant other with whom the patient will be residing, and identified as the person(s) who will aid the patient in the event of a mental health crisis.  With written consent from the patient, two attempts were made to provide suicide prevention education, prior to and/or following the patient's discharge.  We were unsuccessful in providing suicide prevention education.  A suicide education pamphlet was given to the patient to share with family/significant other.  Date and time of first attempt: 10/31/2011  12:33 PM   [name] 10/31/2011, 12:32 PM

## 2011-10-31 NOTE — Progress Notes (Signed)
Patient ID: Eduardo Fuentes, male   DOB: [birth year], 61 y.o.   MRN: [id number] D. The patient spent time resting in bed. He is pleasant and engages easily in conversation. Stated he was feeling good, but was worried that his morning dose of Klonopin is not due until 8 a.m  and that he might be too nervous to go to breakfast. A. Support given. Encouraged not to project what might happen. Reassured that if he was feeling overwhelmed with anxiety that all he had to do was let staff know. Medication reviewed with patient. R. Accepting of support and encouragement by staff. Initially refused his HS dose of Seroquel thinking it would be too much with the Klonopin, but after talking with staff agreed to take it.

## 2011-10-31 NOTE — Progress Notes (Signed)
Psychoeducational Group Note  Date:  10/31/2011 Time:  11:35  Group Topic/Focus:  Relapse Prevention Planning:   The focus of this group is to define relapse and discuss the need for planning to combat relapse.  Participation Level:  Minimal  Participation Quality:  Appropriate  Affect:  Appropriate  Cognitive:  Appropriate  Insight:  Good  Engagement in Group:  Good  Additional Comments:  Did not have much to say but when asked questions directly at him he answered them appropriatly.  Juanda Chance Jvette 10/31/2011, 11:36 AM

## 2011-10-31 NOTE — Progress Notes (Signed)
D: Patient denies SI/HI. The patient has an anxious mood and affect. The patient rates his depression and hopelessness both a 9 out of 10 (1 low/10 high). The patient reports sleeping well and states that his appetite is improving and that his energy level is low. The patient has been pacing up and down the hall and frequently comes up to the the nursing window to discuss medications. The patient stated that he feels "the klonopin isn't working" and that he wants to discuss the medication with the MD.  A: Patient given emotional support from RN. Patient encouraged to come to staff with concerns and/or questions. Patient's medication routine continued. Patient's orders and plan of care reviewed. Patient referred to MD for possible medication adjustments.  R: Patient remains appropriate and cooperative. Will continue to monitor patient q15 minutes for safety.

## 2011-10-31 NOTE — Progress Notes (Signed)
Pt attended discharge planning group and actively participated in group.  SW provided pt with today's workbook.  Pt presents with anxious mood and affect.  Pt rates depression and anxiety at a 10 today.  Pt denies SI.  Pt states that he continues to have high anxiety and difficult mornings.  Pt states that he wants to talk to the dr about his medications.  Pt is scheduled to follow up with [facility].  No further needs voiced by pt at this time.    Reyes Ivan, LCSWA 10/31/2011  9:46 AM

## 2011-10-31 NOTE — Progress Notes (Signed)
[facility] MD Progress Note  10/31/2011 3:25 PM  S: "I was so anxious yesterday that the doctor decided to put me on clonazepam.  I took 1 pill as soon as it was prescribed and I felt much better. The mornings are the  [facility] for me. My mood could feel better if my anxiety would lessen. I am not suicidal, homicidal and or AVH".  Diagnosis:   Axis I: Generalized Anxiety Disorder and Major depressive disorder, recurrent episode, panic disorder with agoraphobia Axis II: Deferred Axis III:  Past Medical History  Diagnosis Date  . Tinnitus   . Depression   . Anxiety   . Diverticulosis   . Colon polyps   . Tinnitus   . High blood pressure    Axis IV: other psychosocial or environmental problems Axis V: 41-50 serious symptoms  ADL's:  Intact  Sleep: Good  Appetite:  Good  Suicidal Ideation: "No" Plan:  No Intent:  No Means:  No Homicidal Ideation: "No" Plan:  No Intent:  No Means:  No  AEB (as evidenced by):  Mental Status Examination/Evaluation: Objective:  Appearance: Casual  Eye Contact::  Good  Speech:  Clear and Coherent  Volume:  Normal  Mood:  Anxious  Affect:  Flat  Thought Process:  Coherent  Orientation:  Full  Thought Content:  Rumination  Suicidal Thoughts:  No  Homicidal Thoughts:  No  Memory:  Immediate;   Good Recent;   Good Remote;   Good  Judgement:  Good  Insight:  Good  Psychomotor Activity:  Restlessness  Concentration:  Fair  Recall:  Good  Akathisia:  No  Handed:  Right  AIMS (if indicated):     Assets:  Desire for Improvement  Sleep:  Number of Hours: 6.5    Vital Signs:Blood pressure 108/75, pulse 94, temperature 98 F (36.7 C), temperature source Oral, resp. rate 16, height 5' 8.1" (1.73 m), weight 88.905 kg (196 lb), SpO2 100.00%. Current Medications: Current Facility-Administered Medications  Medication Dose Route Frequency Provider Last Rate Last Dose  . acetaminophen (TYLENOL) tablet 1,000 mg  1,000 mg Oral Q6H PRN Kerry Hough, PA   1,000 mg at 10/30/11 2153  . alum & mag hydroxide-simeth (MAALOX/MYLANTA) 200-200-20 MG/5ML suspension 30 mL  30 mL Oral Q4H PRN Kerry Hough, PA      . clonazePAM [facility]) tablet 0.5 mg  0.5 mg Oral [id number] Curlene Labrum Readling, MD   0.5 mg at 10/31/11 1410  . clonazePAM (KLONOPIN) tablet 0.5 mg  0.5 mg Oral NOW Curlene Labrum Readling, MD   0.5 mg at 10/30/11 1646  . hydrochlorothiazide (MICROZIDE) capsule 12.5 mg  12.5 mg Oral Daily Curlene Labrum Readling, MD   12.5 mg at 10/31/11 0758  . losartan (COZAAR) tablet 50 mg  50 mg Oral Daily Curlene Labrum Readling, MD   50 mg at 10/31/11 0758  . magnesium hydroxide (MILK OF MAGNESIA) suspension 30 mL  30 mL Oral Daily PRN Kerry Hough, PA      . PARoxetine (PAXIL) tablet 40 mg  40 mg Oral BH-q7a Curlene Labrum Readling, MD   40 mg at 10/31/11 0626  . QUEtiapine (SEROQUEL) tablet 25 mg  25 mg Oral Q8H PRN Sanjuana Kava, NP   25 mg at 10/31/11 0546  . QUEtiapine (SEROQUEL) tablet 25 mg  25 mg Oral [id number] Curlene Labrum Readling, MD   25 mg at 10/31/11 1411  . venlafaxine XR (EFFEXOR-XR) 24 hr capsule 37.5 mg  37.5 mg Oral  Q breakfast Curlene Labrum Readling, MD   37.5 mg at 10/31/11 0759  . DISCONTD: QUEtiapine (SEROQUEL) tablet 50 mg  50 mg Oral [id number] Curlene Labrum Readling, MD   50 mg at 10/30/11 1302    Lab Results: No results found for this or any previous visit (from the past 48 hour(s)).  Physical Findings: AIMS:  , ,  ,  ,    CIWA:    COWS:     Treatment Plan Summary: Daily contact with patient to assess and evaluate symptoms and progress in treatment Medication management  Plan: No new changes made on current treatment regimen. Continue current treatment plan.  Armandina Stammer I 10/31/2011, 3:25 PM

## 2011-10-31 NOTE — Tx Team (Signed)
Interdisciplinary Treatment Plan Update (Adult)  Date:  10/31/2011  Time Reviewed:  10:04 AM   Progress in Treatment: Attending groups: Yes Participating in groups:  Yes Taking medication as prescribed: Yes Tolerating medication:  Yes Family/Significant other contact made: No Patient understands diagnosis:  Yes Discussing patient identified problems/goals with staff:  Yes Medical problems stabilized or resolved:  Yes Denies suicidal/homicidal ideation: Yes Issues/concerns per patient self-inventory:  None identified Other: N/A  New problem(s) identified: None Identified  Reason for Continuation of Hospitalization: Anxiety Depression Medication stabilization  Interventions implemented related to continuation of hospitalization: mood stabilization, medication monitoring and adjustment, group therapy and psycho education, safety checks q 15 mins  Additional comments: Pt reports feeling better now but needs to be stabilized on medication and address substance use in groups.    Estimated length of stay: 2-3 days  Discharge Plan: Pt will follow up with [facility] for medication management and therapy.    New goal(s): N/A  Review of initial/current patient goals per problem list:    1.  Goal(s): Reduce depressive symptoms  Met:  No  Target date: by discharge  As evidenced by: Reducing depression from a 10 to a 3 as reported by pt. Pt rates at a 10 today.   2.  Goal (s): Reduce/Eliminate suicidal ideation  Met:  Yes  Target date: by discharge  As evidenced by: pt reporting no SI.     1.  Goal(s): Reduce anxiety symptoms  Met:  No  Target date: by discharge  As evidenced by: Reducing depression from a 10 to a 3 as reported by pt. Pt rates at a 10 today.     Attendees: Patient:     Family:     Physician:  Orson Aloe, MD  10/31/2011  10:04 AM   Nursing:   Nestor Ramp, RN 10/31/2011 10:04 AM   Case Manager:  Reyes Ivan, LCSWA 10/31/2011   10:04 AM   Counselor:  [name], LCSW 10/31/2011  10:04 AM   Other:  Juline Patch, LCSW 10/31/2011  10:04 AM   Other:  Berneice Heinrich, RN 10/31/2011 10:04 AM   Other:  Boykin Nearing, MSW intern 10/31/2011 10:05 AM   Other:      Scribe for Treatment Team:   Carmina Miller, 10/31/2011 , 10:04 AM

## 2011-10-31 NOTE — Progress Notes (Signed)
[facility] Group Notes:  (Counselor/Nursing/MHT/Case Management/Adjunct)  10/31/2011 10:08 PM  Type of Therapy:  Psychoeducational Skills  Participation Level:  Minimal  Participation Quality:  Attentive  Affect:  Depressed  Cognitive:  Appropriate  Insight:  Good  Engagement in Group:  Limited  Engagement in Therapy:  Limited  Modes of Intervention:  Education  Summary of Progress/Problems: The patient stated that he had an up and down day.  He states that he battled his panic attacks throughout the day. His goal for tomorrow is to "handle my anxiety better".    Surah Pelley S 10/31/2011, 10:08 PM

## 2011-11-01 MED ORDER — CLONAZEPAM 0.5 MG PO TABS
0.5000 mg | ORAL_TABLET | ORAL | Status: DC
  Administered 2011-11-01 – 2011-11-03 (×6): 0.5 mg via ORAL
  Filled 2011-11-01 (×5): qty 1

## 2011-11-01 NOTE — Progress Notes (Signed)
Psychoeducational Group Note  Date:  11/01/2011 Time:  2000  Group Topic/Focus:  Wrap-Up Group:   The focus of this group is to help patients review their daily goal of treatment and discuss progress on daily workbooks.  Participation Level: Did Not Attend  Participation Quality:  Not Applicable  Affect:  Not Applicable  Cognitive:  Not Applicable  Insight:  Not Applicable  Engagement in Group: Not Applicable  Additional Comments:  The patient refused to attend group this evening because he had a very bad headache.   Hazle Coca S 11/01/2011, 10:11 PM

## 2011-11-01 NOTE — Progress Notes (Signed)
Psychoeducational Group Note  Date:  11/01/2011 Time: 1015  Group Topic/Focus:  Identifying Needs:   The focus of this group is to help patients identify their personal needs that have been historically problematic and identify healthy behaviors to address their needs.  Participation Level:  active Participation Quality: good Affect: flat Cognitive:    Insight:  good  Engagement in Group: engaged  Additional Comments: Pt was engaged in group, asked appropriate questions, shared personal life experience with the group and demonstrates willingness to process and understand her problems. PDuke RN BC    

## 2011-11-01 NOTE — Progress Notes (Signed)
[facility] Group Notes:  (Counselor/Nursing/MHT/Case Management/Adjunct)  11/01/2011 3:29 PM  Type of Therapy:  Group Therapy  Participation Level:  Active  Participation Quality:  Appropriate and Attentive  Affect:  Appropriate  Cognitive:  Appropriate  Insight:  Good  Engagement in Group:  Good  Engagement in Therapy:  Good  Modes of Intervention:  Clarification, Education, Socialization and Support  Summary of Progress/Problems: Pt. participated in group discussion on  Self sabotaging behaviors and how to change the way they think in order to make necessary changes in their live in positive ways. Each pt. Shared their self sabotaging behavior and what they can  Do to positively enable themselves. Each pt. Shared what self sabotaging meant to them.  Pt. States his self sabotaging behavior was leaving in fear and worrying about th e future. The pt. Spoke about learning to take one day at a time and focusing on the " here and know".   Eduardo Fuentes 11/01/2011, 3:29 PM

## 2011-11-01 NOTE — Progress Notes (Signed)
[facility] Group Notes:  (Counselor/Nursing/MHT/Case Management/Adjunct)  11/01/2011 3:05 PM  Type of Therapy:  After care Planning Group  Pt. Participated in After Care Planning group and was given [facility] SI pamphlet and crisis hotline numbers and agreed to use them if needed.  The pt. Spoke about coming to [facility] due to depression and anxiety. Pt. States he was in the IOP program at [facility] and that after he finished the program and went back to work he felt that he was slipping back into his depression. Pt. States he had been unable to see the doctor and last week and wants to be seen byType of Therapy:  After care Planning Group  Pt. Participated in After Care Planning group and was given [facility] SI pamphlet and crisis hotline numbers and agreed to use them if needed. Type of Therapy:  After care Planning Gro 11/01/2011,

## 2011-11-01 NOTE — Progress Notes (Signed)
[facility] MD Progress Note  11/01/2011 1:37 PM  Diagnosis:   Axis I: Generalized Anxiety Disorder Axis II: Deferred Axis III:  Past Medical History  Diagnosis Date  . Tinnitus   . Depression   . Anxiety   . Diverticulosis   . Colon polyps   . Tinnitus   . High blood pressure    Subjective: Eduardo Fuentes reports that he is not feeling very well today. He continues to experience a significant amount of anxiety. He endorses feeling anxious when he first awakens, and when he takes his first Klonopin some 2 hours later, he gets relief for "maybe an hour." He denies any suicidal or homicidal ideation. He denies any auditory or visual hallucinations. Both his sleep and his appetite are affected by his anxiety.  ADL's:  Intact  Sleep: Fair  Appetite:  Fair  Suicidal Ideation:  Patient denies any thought, plan, or intent Homicidal Ideation:  Patient denies any thought, plan, or intent  AEB (as evidenced by):  Mental Status Examination/Evaluation: Objective:  Appearance: Casual and Fairly Groomed  Eye Contact::  Good  Speech:  Clear and Coherent and Copious  Volume:  Normal  Mood:  Anxious  Affect:  Congruent  Thought Process:  Circumstantial  Orientation:  Full  Thought Content:  Obsessions  Suicidal Thoughts:  No  Homicidal Thoughts:  No  Memory:  Immediate;   Good Recent;   Good Remote;   Good  Judgement:  Fair  Insight:  Fair  Psychomotor Activity:  Restlessness  Concentration:  Good  Recall:  Good  Akathisia:  No  Handed:    AIMS (if indicated):     Assets:  Desire for Improvement  Sleep:  Number of Hours: 4    Vital Signs:Blood pressure 134/90, pulse 96, temperature 98 F (36.7 C), temperature source Oral, resp. rate 20, height 5' 8.1" (1.73 m), weight 88.905 kg (196 lb), SpO2 100.00%. Current Medications: Current Facility-Administered Medications  Medication Dose Route Frequency Provider Last Rate Last Dose  . acetaminophen (TYLENOL) tablet 1,000 mg  1,000 mg Oral Q6H PRN  Kerry Hough, PA   1,000 mg at 10/30/11 2153  . alum & mag hydroxide-simeth (MAALOX/MYLANTA) 200-200-20 MG/5ML suspension 30 mL  30 mL Oral Q4H PRN Kerry Hough, PA      . clonazePAM Scarlette Calico) tablet 0.5 mg  0.5 mg Oral BH-q8a2phs Jorje Guild, PA-C      . hydrochlorothiazide (MICROZIDE) capsule 12.5 mg  12.5 mg Oral Daily Curlene Labrum Readling, MD   12.5 mg at 11/01/11 0808  . losartan (COZAAR) tablet 50 mg  50 mg Oral Daily Curlene Labrum Readling, MD   50 mg at 11/01/11 0808  . magnesium hydroxide (MILK OF MAGNESIA) suspension 30 mL  30 mL Oral Daily PRN Kerry Hough, PA      . PARoxetine (PAXIL) tablet 40 mg  40 mg Oral BH-q7a Curlene Labrum Readling, MD   40 mg at 11/01/11 0600  . QUEtiapine (SEROQUEL) tablet 25 mg  25 mg Oral Q8H PRN Sanjuana Kava, NP   25 mg at 11/01/11 0600  . QUEtiapine (SEROQUEL) tablet 25 mg  25 mg Oral BH-q8a2phs Curlene Labrum Readling, MD   25 mg at 11/01/11 0808  . venlafaxine XR (EFFEXOR-XR) 24 hr capsule 37.5 mg  37.5 mg Oral Q breakfast Curlene Labrum Readling, MD   37.5 mg at 11/01/11 0808  . DISCONTD: clonazePAM (KLONOPIN) tablet 0.5 mg  0.5 mg Oral BH-q8a2phs Curlene Labrum Readling, MD   0.5 mg at  11/01/11 0808    Lab Results: No results found for this or any previous visit (from the past 48 hour(s)).  Physical Findings: AIMS:  , ,  ,  ,    CIWA:    COWS:     Treatment Plan Summary: Daily contact with patient to assess and evaluate symptoms and progress in treatment Medication management  Plan: We will administer his first dose of Klonopin earlier in the morning. We will continue other medications as currently prescribed, and give them time to become effective.  Ranessa Kosta 11/01/2011, 1:37 PM

## 2011-11-01 NOTE — Progress Notes (Signed)
D) Pt has attended the groups today. Continues to have high anxiety and comes to the med window often wanting to know why he is not feeling better. Explained that he had ringing in his ears starting in Jan and his anxiety just started then. States that his mother and siblings suffer from anxiety, but he has never had it like this. States he is having a lot of trouble handling it. Denies SI and HI. Rates his depression and his hopelessness both at a 10 and denies SI and HI. A)Provided with frequent 1:1's throughout the day. Given encouragement and reassurance as well. Provided with active listening. j R) Pt continues to have increased anxiety. Medications increased to help with this.

## 2011-11-02 MED ORDER — QUETIAPINE FUMARATE 50 MG PO TABS
50.0000 mg | ORAL_TABLET | ORAL | Status: DC
  Administered 2011-11-02 – 2011-11-03 (×3): 50 mg via ORAL
  Filled 2011-11-02 (×11): qty 1

## 2011-11-02 NOTE — Progress Notes (Signed)
[facility] MD Progress Note  11/02/2011 12:53 PM  Diagnosis:   Axis I: Generalized Anxiety Disorder and Major Depression, Recurrent severe Axis II: Deferred Axis III:  Past Medical History  Diagnosis Date  . Tinnitus   . Depression   . Anxiety   . Diverticulosis   . Colon polyps   . Tinnitus   . High blood pressure    Subjective: Eduardo Fuentes reports that he continues to experience a significant amount of anxiety. Is taking his morning dose of Klonopin earlier has not seemed to make any difference for him. He continues to obsess over his not getting better, and he wonders if he would be better off at home. He denies any suicidal or homicidal ideation. He denies any auditory or visual hallucinations. He reports that he slept well last night. He endorses a good appetite.  ADL's:  Intact  Sleep: Good  Appetite:  Good  Suicidal Ideation:  Patient denies any thought, plan, or intent Homicidal Ideation:  Patient denies any thought, plan, or intent  AEB (as evidenced by):  Mental Status Examination/Evaluation: Objective:  Appearance: Casual  Eye Contact::  Good  Speech:  Clear and Coherent  Volume:  Normal  Mood:  Anxious  Affect:  Constricted  Thought Process:  Circumstantial  Orientation:  Full  Thought Content:  Obsessions and Rumination  Suicidal Thoughts:  No  Homicidal Thoughts:  No  Memory:  Immediate;   Good Recent;   Good Remote;   Good  Judgement:  Fair  Insight:  Fair  Psychomotor Activity:  Normal  Concentration:  Good  Recall:  Good  Akathisia:  No  Handed:    AIMS (if indicated):     Assets:  Desire for Improvement  Sleep:  Number of Hours: 5    Vital Signs:Blood pressure 113/76, pulse 92, temperature 97.5 F (36.4 C), temperature source Oral, resp. rate 18, height 5' 8.1" (1.73 m), weight 88.905 kg (196 lb), SpO2 100.00%. Current Medications: Current Facility-Administered Medications  Medication Dose Route Frequency Provider Last Rate Last Dose  . acetaminophen  (TYLENOL) tablet 1,000 mg  1,000 mg Oral Q6H PRN Kerry Hough, PA   1,000 mg at 11/01/11 2127  . alum & mag hydroxide-simeth (MAALOX/MYLANTA) 200-200-20 MG/5ML suspension 30 mL  30 mL Oral Q4H PRN Kerry Hough, PA      . clonazePAM Scarlette Calico) tablet 0.5 mg  0.5 mg Oral BH-q8a2phs Jorje Guild, PA-C   0.5 mg at 11/02/11 1610  . hydrochlorothiazide (MICROZIDE) capsule 12.5 mg  12.5 mg Oral Daily Curlene Labrum Readling, MD   12.5 mg at 11/02/11 0804  . losartan (COZAAR) tablet 50 mg  50 mg Oral Daily Curlene Labrum Readling, MD   50 mg at 11/02/11 0804  . magnesium hydroxide (MILK OF MAGNESIA) suspension 30 mL  30 mL Oral Daily PRN Kerry Hough, PA      . PARoxetine (PAXIL) tablet 40 mg  40 mg Oral BH-q7a Curlene Labrum Readling, MD   40 mg at 11/02/11 9604  . QUEtiapine (SEROQUEL) tablet 25 mg  25 mg Oral Q8H PRN Sanjuana Kava, NP   25 mg at 11/01/11 0600  . QUEtiapine (SEROQUEL) tablet 50 mg  50 mg Oral BH-q8a2phs Jorje Guild, PA-C      . venlafaxine XR (EFFEXOR-XR) 24 hr capsule 37.5 mg  37.5 mg Oral Q breakfast Curlene Labrum Readling, MD   37.5 mg at 11/02/11 0804  . DISCONTD: clonazePAM (KLONOPIN) tablet 0.5 mg  0.5 mg Oral BH-q8a2phs Curlene Labrum  Readling, MD   0.5 mg at 11/01/11 0808  . DISCONTD: QUEtiapine (SEROQUEL) tablet 25 mg  25 mg Oral BH-q8a2phs Curlene Labrum Readling, MD   25 mg at 11/02/11 1610    Lab Results: No results found for this or any previous visit (from the past 48 hour(s)).  Physical Findings: AIMS:  , ,  ,  ,    CIWA:    COWS:     Treatment Plan Summary: Daily contact with patient to assess and evaluate symptoms and progress in treatment Medication management  Plan: We will increase his Seroquel to 50 mg during the day, and hopes that it will target his obsessive and ruminating thought pattern. He reports he had been on Risperdal in the past, but cannot recall why it was discontinued. He also reports that he was on Abilify in the past, but again cannot recall why it was  discontinued.  Elease Swarm 11/02/2011, 12:53 PM

## 2011-11-02 NOTE — Progress Notes (Signed)
Patient ID: Eduardo Fuentes, male   DOB: 11-10-1950, 61 y.o.   MRN: [id number] D. The patient is superficially bright and extremely pleasant. He spent most of the evening in his room in bed. Stated he was feeling less anxious this evening. Denied any suicidal ideation. He did not attend group, reporting he had a headache and needed to rest.  A. Medicated for a headache and HS medications. Encouraged to spend some time out of his room.  R. The patient socialized for a brief amount of time in the dayroom. Will continue to monitor.

## 2011-11-02 NOTE — Progress Notes (Signed)
Eduardo Fuentes remains anxious and nervous. He focuses on on his anxiety..stating " what happens if I don't get any better....and what happens whne we run out of medicine?" He paces. HE has diff attending in his groups.              A He completed his self inventory and on it he wrote he  Denied SI, he rated his depression and hopelessness " 8/8" and states he " just doesn't know" about  My DC plan.    R Safety is in pl;ace. POC includes continuing to foster therapeutic relationship  PD RN [facility]

## 2011-11-02 NOTE — Progress Notes (Signed)
Psychoeducational Group Note  Date:  11/02/2011 Time: 1015 Group Topic/Focus:  Making Healthy Choices:   The focus of this group is to help patients identify negative/unhealthy choices they were using prior to admission and identify positive/healthier coping strategies to replace them upon discharge.  Participation Level:  Minimal  Participation Quality:  Attentive  Affect:  Anxious  Cognitive:  Alert  Insight:  Limited  Engagement in Group:  Limited  Additional Comments:    Rich Brave 1:22 PM. 11/02/2011

## 2011-11-02 NOTE — Progress Notes (Signed)
[facility] Group Notes:  (Counselor/Nursing/MHT/Case Management/Adjunct)  11/02/2011 4:45 PM  Type of Therapy:  After Care Planning Group  Pt. participated in after care planning group and was given [facility] SI pamphlet and crisis  hot line number. The pt. Agreed  to use them if needed. The pt. Was also ive information on the [facility],  And the Therapeutic crisis hot line number and agreed to use them if needed. Each member of group was encouraged to attend support groups and utilize community resources for support.  Pt. States he was not doing good today and that he was given a new medication for anxiety which he felt was not helping. Pt. Just stated medication yesterday and was reminded that it takes time for medication to help. Pt. stated he "Just wants to go home" but was told but therapist that he needs to get himself well and stable rather than concentrating on going home.  Neila Gear 11/02/2011, 4:45 PM

## 2011-11-02 NOTE — Progress Notes (Signed)
Psychoeducational Group Note  Date:  11/02/2011 Time:  2000  Group Topic/Focus:  Wrap-Up Group:   The focus of this group is to help patients review their daily goal of treatment and discuss progress on daily workbooks.  Participation Level: Did Not Attend  Participation Quality:  Not Applicable  Affect:  Not Applicable  Cognitive:  Not Applicable  Insight:  Not Applicable  Engagement in Group: Not Applicable  Additional Comments:  The patient did not attend group this evening.   Hazle Coca S 11/02/2011, 11:44 PM

## 2011-11-02 NOTE — Progress Notes (Signed)
[facility] Group Notes:  (Counselor/Nursing/MHT/Case Management/Adjunct)  11/02/2011 4:51 PM  Type of Therapy:  Group Therapy  Participation Level:  Did Not Attend   Eduardo Fuentes 11/02/2011, 4:51 PM

## 2011-11-03 MED ORDER — VENLAFAXINE HCL ER 75 MG PO CP24
75.0000 mg | ORAL_CAPSULE | Freq: Every day | ORAL | Status: DC
  Administered 2011-11-04 – 2011-11-05 (×2): 75 mg via ORAL
  Filled 2011-11-03 (×4): qty 1

## 2011-11-03 MED ORDER — GABAPENTIN 100 MG PO CAPS
200.0000 mg | ORAL_CAPSULE | ORAL | Status: DC
  Administered 2011-11-03 – 2011-11-05 (×7): 200 mg via ORAL
  Filled 2011-11-03 (×13): qty 2

## 2011-11-03 MED ORDER — CLONAZEPAM 1 MG PO TABS
1.0000 mg | ORAL_TABLET | ORAL | Status: DC
  Administered 2011-11-03 – 2011-11-05 (×7): 1 mg via ORAL
  Filled 2011-11-03 (×7): qty 1

## 2011-11-03 NOTE — Tx Team (Signed)
Interdisciplinary Treatment Plan Update (Adult)  Date:  11/03/2011  Time Reviewed:  10:12 AM   Progress in Treatment: Attending groups: Yes Participating in groups:  Yes Taking medication as prescribed: Yes Tolerating medication:  Yes Family/Significant other contact made:  Yes Patient understands diagnosis:  Yes Discussing patient identified problems/goals with staff:  Yes Medical problems stabilized or resolved:  Yes Denies suicidal/homicidal ideation: Yes Issues/concerns per patient self-inventory:  None identified Other: N/A  New problem(s) identified: None Identified  Reason for Continuation of Hospitalization: Anxiety Depression Medication stabilization  Interventions implemented related to continuation of hospitalization: mood stabilization, medication monitoring and adjustment, group therapy and psycho education, safety checks q 15 mins  Additional comments: N/A  Estimated length of stay: 2-3 days  Discharge Plan: Pt is scheduled to follow up with [facility]. For medication management and therapy.    New goal(s): N/A  Review of initial/current patient goals per problem list:    1.  Goal(s): Reduce depressive symptoms  Met:  No  Target date: by discharge  As evidenced by: Reducing depression from a 10 to a 3 as reported by pt. Pt rates at a 7 today.    2.  Goal (s): Reduce/Eliminate suicidal ideation  Met:  Yes  Target date: by discharge  As evidenced by: pt reporting no SI.    3.  Goal(s): Reduce anxiety symptoms  Met:  No  Target date: by discharge  As evidenced by: Reduce anxiety from a 10 to a 3 as reported by pt. Pt rates at a 7 today.     Attendees: Patient:     Family:     Physician:  Franchot Gallo, MD  11/03/2011  10:12 AM   Nursing:   Quintella Reichert, RN 11/03/2011 10:13 AM   Case Manager:  Reyes Ivan, LCSWA 11/03/2011  10:12 AM   Counselor:  [name], LCSW 11/03/2011  10:12 AM   Other:  Juline Patch, LCSW 11/03/2011  10:12 AM     Other:  Rodman Key, RN 11/03/2011 10:13 AM   Other:     Other:      Scribe for Treatment Team:   Carmina Miller, 11/03/2011 , 10:12 AM

## 2011-11-03 NOTE — Progress Notes (Signed)
D: Pt denies SI/HI/AVH. Pt rates hopelessness as 4,  depression as 4 , and anxiety as 4.  Pt affect is anxious. Mood is anxious. Pt states that his anxiety typically lessens at night when "it's dark and time to go to bed." Pt mentioned that he was tearful earlier when his wife was here to visit because he did not want her staying at home alone. Pt did attend evening group. A: Support and encouragement offered. Scheduled medications given to pt. Q 15 min checks continued for patient safety. R: Pt receptive. Pt remains safe on the unit.

## 2011-11-03 NOTE — Progress Notes (Addendum)
D:   Patient's self inventory sheet, patient sleeps well, has improving appetite, low energy level, improving attention span.  Rated depression and hopelessness as #8.  Denied SI.   Has had headache in past 24 hours.   Denied withdrawals.  Zero pain goal,  Worst pain #2.  Not sure of discharge plans. A:  Medications per MD order.   Support and encouragement given throughout day.  Safety checks as ordered. R:  Following treatment plan.  Denied SI * HI.  Denied A/V hallucinations.  Patient remains safe and receptive on unit.  Patient stated he would like to go home.  Patient feels he is more depressed than before his admission to [facility].   Would like to talk to MD about his medications.

## 2011-11-03 NOTE — Progress Notes (Addendum)
[facility] Group Notes: (Counselor/Nursing/MHT/Case Management/Adjunct) 11/03/2011   @1 :15-2:30PM Overcoming Obstacles to Wellness   Type of Therapy:  Group Therapy  Participation Level:  None  Participation Quality: Attentive    Affect:  Blunted  Cognitive:  Appropriate  Insight:  None  Engagement in Group: Minimal  Engagement in Therapy:  None  Modes of Intervention:  Support and Exploration  Summary of Progress/Problems: Eduardo Fuentes  was attentive but not engaged in group process   [name] 11/03/2011 3:18 PM

## 2011-11-03 NOTE — Progress Notes (Signed)
Psychoeducational Group Note  Date:  11/03/2011 Time:  2000  Group Topic/Focus:  Wrap-Up Group:   The focus of this group is to help patients review their daily goal of treatment and discuss progress on daily workbooks.  Participation Level:  Did Not Attend  Participation Quality:  N/A Did not attend  Affect:    Cognitive:    Insight:    Engagement in Group:    Additional Comments:  Patient did not attend group this evening. Patient was not feeling well and reported a headache so patient remained in bed for the duration of wrap-up group this evening.   [name], [place] Pigg 11/03/2011, 9:42 PM

## 2011-11-03 NOTE — Progress Notes (Signed)
[facility] Group Notes:  (Counselor/Nursing/MHT/Case Management/Adjunct)  11/03/2011 1:18 PM  Type of Therapy:  Psychoeducational Skills  Participation Level:  Did Not Attend   Summary of Progress/Problems:Patient did not attend.    Ardelle Park O 11/03/2011, 1:18 PM

## 2011-11-03 NOTE — Progress Notes (Signed)
[facility] MD Progress Note  11/03/2011 1:56 PM  Current Mental Status Per Physician:   Diagnosis:  AXIS I:  Major Depressive Disorder - Recurrent - Severe.    Generalized Anxiety Disorder.    Panic Disorder with Agoraphobia.  AXIS II:  Deferred.  AXIS III:  1. Hypertension.    2. Seasonal Allergies.    3. Tinnitus.  AXIS IV:  Chronic Mental Illness. Occupational Issues.  AXIS V:  GAF at time of admission approximately 45. Highest GAF in the last year approximately 60.   The patient was seen today and reports the following:   ADL's: Good.  Sleep: The patient reports to sleeping very well last night.  Appetite: The patient reports a fair appetite today.   Mild>(1-10) >Severe  Hopelessness (1-10): 10  Depression (1-10): 10  Anxiety (1-10): 10   Suicidal Ideation: The patient denies any current suicidal ideations today.  Plan: No  Intent: No  Means: No   Homicidal Ideation: The patient denies any current homicidal ideations today.  Plan: No  Intent: No.  Means: No   General Appearance/Behavior: The patient was cooperative today with this provider but appeared mild to moderately anxious.  Eye Contact: Good.  Speech: Appropriate in rate and volume with no pressuring noted.  Motor Behavior: wnl.  Level of Consciousness: Alert and Oriented x 3.  Mental Status: Alert and Oriented x 3.  Mood: Depressed - Appears moderately depressed.  Affect: Appears moderately anxious.  Anxiety Level: Severe anxiety reported today but with patient appearing mild to moderately anxious.  Thought Process: wnl  Thought Content: The patient denies any auditory or visual hallucinations today as well as any delusional thinking.  Perception: wnl.  Judgment: Fair to Good.  Insight: Fair to Good.  Cognition: Oriented to person, place and time.  Sleep:  Number of Hours: 6.25    Vital Signs:Blood pressure 131/80, pulse 106, temperature 97.9 F (36.6 C), temperature source Oral, resp. rate 16, height 5'  8.1" (1.73 m), weight 88.905 kg (196 lb), SpO2 100.00%.  Current Medications: Current Facility-Administered Medications  Medication Dose Route Frequency Provider Last Rate Last Dose  . acetaminophen (TYLENOL) tablet 1,000 mg  1,000 mg Oral Q6H PRN Kerry Hough, PA   1,000 mg at 11/01/11 2127  . alum & mag hydroxide-simeth (MAALOX/MYLANTA) 200-200-20 MG/5ML suspension 30 mL  30 mL Oral Q4H PRN Kerry Hough, PA      . clonazePAM (KLONOPIN) tablet 1 mg  1 mg Oral BH-q8a2phs Cherylin Waguespack D Jaquavius Hudler, MD      . gabapentin (NEURONTIN) capsule 200 mg  200 mg Oral BH-q8a2phs Abbrielle Batts D Ryden Wainer, MD      . hydrochlorothiazide (MICROZIDE) capsule 12.5 mg  12.5 mg Oral Daily Curlene Labrum Dula Havlik, MD   12.5 mg at 11/03/11 4098  . losartan (COZAAR) tablet 50 mg  50 mg Oral Daily Curlene Labrum Jud Fanguy, MD   50 mg at 11/03/11 1191  . magnesium hydroxide (MILK OF MAGNESIA) suspension 30 mL  30 mL Oral Daily PRN Kerry Hough, PA      . PARoxetine (PAXIL) tablet 40 mg  40 mg Oral BH-q7a Curlene Labrum Montrelle Eddings, MD   40 mg at 11/03/11 0622  . QUEtiapine (SEROQUEL) tablet 25 mg  25 mg Oral Q8H PRN Sanjuana Kava, NP   25 mg at 11/01/11 0600  . venlafaxine XR (EFFEXOR-XR) 24 hr capsule 75 mg  75 mg Oral Q breakfast Ronny Bacon, MD      . DISCONTD: clonazePAM Scarlette Calico)  tablet 0.5 mg  0.5 mg Oral BH-q8a2phs Jorje Guild, PA-C   0.5 mg at 11/03/11 1610  . DISCONTD: QUEtiapine (SEROQUEL) tablet 50 mg  50 mg Oral BH-q8a2phs Jorje Guild, PA-C   50 mg at 11/03/11 9604  . DISCONTD: venlafaxine XR (EFFEXOR-XR) 24 hr capsule 37.5 mg  37.5 mg Oral Q breakfast Curlene Labrum Zannie Locastro, MD   37.5 mg at 11/03/11 5409   Lab Results: No results found for this or any previous visit (from the past 48 hour(s)).  Review of Systems:  Neurological: The patient denies any headaches today. He denies any seizures or dizziness.  G.I.: The patient denies any constipation today or G.I. Upset.  Musculoskeletal: The patient denies any muscle or skeletal difficulties  today.   Time was spent today discussing with the patient his current symptoms. The patient reports that he is now sleeping well without difficulty.   He reports a decreased appetite and reports ongoing severe feelings of sadness, anhedonia and depressed mood. He denies any current suicidal or homicidal ideations and denies any auditory or visual hallucinations or delusional thinking.  The patient states that his anxiety symptoms remain under poor control and he does not feel his current medications are helping.  He states the medication Seroquel is resulting in daytime sedation but is not helping with anxiety.  The patient denies any other medication related side effects.  Treatment Plan Summary:  1. Daily contact with patient to assess and evaluate symptoms and progress in treatment  2. Medication management  3. The patient will deny suicidal ideations or homicidal ideations for 48 hours prior to discharge and have a depression and anxiety rating of 3 or less. The patient will also deny any auditory or visual hallucinations or delusional thinking and display no manic or hypomanic behaviors.  4. The patient will deny any symptoms of substance withdrawal at time of discharge.   Plan:  1. Will continue the patient on the medication Paxil at 40 mgs po q am for depression and anxiety.  2. Will increase the medication Effexor XR to 75 mgs po q am to further address the patient's depressive symptoms.  3. Will discontinue the medication Seroquel since the patient states this is not helping with his anxiety but is causing daytime sedation. 4. Will increase the medication Klonopin to 1 mg po q am, 2 pm and hs to further address his anxiety symptoms. 5. Will start the patient on the medication Neurontin at 200 mgs po q am, 2 pm and hs to further address his anxiety symptoms. 6. Laboratory Studies reviewed.  7. Will continue to monitor.   Sheilla Maris 11/03/2011, 1:56 PM

## 2011-11-03 NOTE — Progress Notes (Signed)
Pt observed in his room in bed awake.  He reports his day has been good, but he now has a headache and does not feel he can go to group tonight.  He says he attended most of the groups during the day.  He denies SI/HI/AV to this Clinical research associate.  He says he still feels very anxious/depressed.  He worries about his wife being at home alone.  Gave pt support/encouragement.  Pt voices no other needs/concerns.  Pt makes his needs known to staff.  Pt was changed from Seroquel to Neurontin for tonight.  Will monitor for changes.  Safety maintained with q15 minute checks.

## 2011-11-03 NOTE — Progress Notes (Signed)
Pt attended discharge planning group and actively participated in group.  SW provided pt with today's workbook.  Pt presents with flat affect and depressed mood.  Pt rates depression and anxiety at a 7 today.  Pt denies SI.  Pt reports feeling a little better today.  Pt states that he is still concerned his meds are not working.  Pt is scheduled follow up at [facility] for medication management and therapy.  No further needs voiced by pt at this time.  Safety planning and suicide prevention discussed.  Pt participated in discussion and acknowledged an understanding of the information provided.       Reyes Ivan, LCSWA 11/03/2011  10:12 AM

## 2011-11-04 NOTE — Progress Notes (Signed)
Pt observed in his room awake, lying in bed.  He did not attend evening group.  He reports he feels ok.  He still rates his anxiety as high, but denies SI/HI/AV.  He feels he would be better off at home.  Encouraged pt to discuss his feelings with the MD tomorrow.  He voices no other needs/concerns.  Pt makes his needs known to staff.  Safety maintained with q15 minute checks.

## 2011-11-04 NOTE — Progress Notes (Signed)
Pt attended discharge planning group and actively participated in group.  SW provided pt with today's workbook.  Pt presents with anxious mood and affect.  Pt rates depression and anxiety at a 7 today.  Pt denies SI/HI.  Pt states that he continues to be depressed and anxious.  Pt states that he thinks if he was at home he would feel better.  Pt is scheduled to follow up with [facility] for medication management and therapy.  No further needs voiced by pt at this time.  Safety planning and suicide prevention discussed.  Pt participated in discussion and acknowledged an understanding of the information provided.       Reyes Ivan, LCSWA 11/04/2011  9:22 AM

## 2011-11-04 NOTE — Progress Notes (Signed)
Psychoeducational Group Note  Date:  11/04/2011 Time:  2000  Group Topic/Focus:  Wrap-Up Group:   The focus of this group is to help patients review their daily goal of treatment and discuss progress on daily workbooks.  Participation Level:  Active  Participation Quality:  Attentive, Sharing and Supportive  Affect:  Appropriate  Cognitive:  Alert  Insight:  Good  Engagement in Group:  Good  Additional Comments:  Patient shared that he was feeling anxious early in the day but he feels better and is able to speak in the evening time.  Neldon Shepard, Newton Pigg 11/04/2011, 10:04 PM

## 2011-11-04 NOTE — Progress Notes (Signed)
[facility] MD Progress Note  11/04/2011 3:08 PM  S: "I need some medicine that will lift my mood up. My mood is just down. I feel more depressed, my depression is worsening. I sleep good, I eat good, I feel like I can't talk because I feel so depressed. I think I should be discharged. I will feel better at home being around my wife and dog. I know going home will help more than being in this hospital".  Diagnosis:   Axis I: Generalized Anxiety Disorder and Major depressive disorder, recurrent episode, panic disorder with Agoraphobia Axis II: Deferred Axis III:  Past Medical History  Diagnosis Date  . Tinnitus   . Depression   . Anxiety   . Diverticulosis   . Colon polyps   . Tinnitus   . High blood pressure    Axis IV: other psychosocial or environmental problems Axis V: 41-50 serious symptoms  ADL's:  Intact  Sleep: Good  Appetite:  Good  Suicidal Ideation: "No" Plan:  No Intent:  No Means:  no Homicidal Ideation: "No" Plan:  No Intent:  No Means:  no  AEB (as evidenced by): per patient's reports.  Mental Status Examination/Evaluation: Objective:  Appearance: Casual  Eye Contact::  Good  Speech:  Clear and Coherent  Volume:  Normal  Mood:  "I feel more depressed"  Affect:  Depressed and Flat  Thought Process:  Coherent and Intact  Orientation:  Full  Thought Content:  Rumination  Suicidal Thoughts:  No  Homicidal Thoughts:  No  Memory:  Immediate;   Good Recent;   Good Remote;   Good  Judgement:  Good  Insight:  Good  Psychomotor Activity:  Anxious  Concentration:  Good  Recall:  Good  Akathisia:  No  Handed:  Right  AIMS (if indicated):     Assets:  Desire for Improvement  Sleep:  Number of Hours: 6.75    Vital Signs:Blood pressure 138/90, pulse 87, temperature 97.6 F (36.4 C), temperature source Oral, resp. rate 16, height 5' 8.1" (1.73 m), weight 88.905 kg (196 lb), SpO2 100.00%. Current Medications: Current Facility-Administered Medications    Medication Dose Route Frequency Provider Last Rate Last Dose  . acetaminophen (TYLENOL) tablet 1,000 mg  1,000 mg Oral Q6H PRN Kerry Hough, PA   1,000 mg at 11/03/11 2002  . alum & mag hydroxide-simeth (MAALOX/MYLANTA) 200-200-20 MG/5ML suspension 30 mL  30 mL Oral Q4H PRN Kerry Hough, PA      . clonazePAM (KLONOPIN) tablet 1 mg  1 mg Oral BH-q8a2phs Curlene Labrum Readling, MD   1 mg at 11/04/11 1437  . gabapentin (NEURONTIN) capsule 200 mg  200 mg Oral BH-q8a2phs Curlene Labrum Readling, MD   200 mg at 11/04/11 1437  . hydrochlorothiazide (MICROZIDE) capsule 12.5 mg  12.5 mg Oral Daily Curlene Labrum Readling, MD   12.5 mg at 11/04/11 0756  . losartan (COZAAR) tablet 50 mg  50 mg Oral Daily Curlene Labrum Readling, MD   50 mg at 11/04/11 0756  . magnesium hydroxide (MILK OF MAGNESIA) suspension 30 mL  30 mL Oral Daily PRN Kerry Hough, PA      . PARoxetine (PAXIL) tablet 40 mg  40 mg Oral BH-q7a Curlene Labrum Readling, MD   40 mg at 11/04/11 0616  . QUEtiapine (SEROQUEL) tablet 25 mg  25 mg Oral Q8H PRN Sanjuana Kava, NP   25 mg at 11/01/11 0600  . venlafaxine XR (EFFEXOR-XR) 24 hr capsule 75 mg  75  mg Oral Q breakfast Curlene Labrum Readling, MD   75 mg at 11/04/11 0757    Lab Results: No results found for this or any previous visit (from the past 48 hour(s)).  Physical Findings: AIMS: Facial and Oral Movements Muscles of Facial Expression: None, normal Lips and Perioral Area: None, normal Jaw: None, normal Tongue: None, normal,Extremity Movements Upper (arms, wrists, hands, fingers): None, normal Lower (legs, knees, ankles, toes): None, normal, Trunk Movements Neck, shoulders, hips: None, normal, Overall Severity Severity of abnormal movements (highest score from questions above): None, normal Incapacitation due to abnormal movements: None, normal, Dental Status Current problems with teeth and/or dentures?: No Does patient usually wear dentures?: No  CIWA:  CIWA-Ar Total: 1  COWS:  COWS Total Score: 3    Treatment Plan Summary: Daily contact with patient to assess and evaluate symptoms and progress in treatment Medication management  Plan: Discussed patient's complaints and wishes to be discharged with Dr. Allena Katz. Patient will remain current treatment plan at this time, Effexor XR recently added to treatment regimen.  Will re-evaluated tomorrow.  Armandina Stammer I 11/04/2011, 3:08 PM

## 2011-11-04 NOTE — Progress Notes (Addendum)
D:  Patient's self inventory form, sleeps well, improving appetite, low energy level, improving attention span.  Rated depression and hopelessness #7.  Denied SI.  Zero pain goal, worst pain #2.  After discharge, would like to handle this depression better. A:  Medications per MD order.  Support and encouragement given throughout day.  Safety checks completed as ordered. R:  Following treatment plan.   Denied SI and HI.   Denied A/V hallucinations.  Patient remains safe and receptive on unit.  Have encouraged patient to think positive, deep breathing, and to think about nice vacation/good memories to help with his depression/anxiety.  Patient desires to return home.   Patient has attended groups today.  Has been cooperative and pleasant.

## 2011-11-04 NOTE — Progress Notes (Signed)
[facility] Group Notes:  (Counselor/Nursing/MHT/Case Management/Adjunct)  11/04/2011 7:12 PM  Type of Therapy:  Psychoeducational Skills  Participation Level:  Active  Participation Quality:  Appropriate  Affect:  Appropriate  Cognitive:  Appropriate  Insight:  Good  Engagement in Group:  Good  Engagement in Therapy:  Good  Modes of Intervention:  Education  Summary of Progress/Problems:Patient was instructed to identify two areas in their life in which they would like to make a change toward recovery. Patient was encouraged to define "recovery" and what it means to their present situation. Patient was encouraged to set short realistic goals as well as long term goals.   Ardelle Park O 11/04/2011, 7:12 PM

## 2011-11-04 NOTE — Progress Notes (Signed)
[facility] Group Notes: (Counselor/Nursing/MHT/Case Management/Adjunct) 05/26/2011   @11 :00am Finding  Balance through Mindfulness   Type of Therapy:  Group Therapy  Participation Level:  None  Participation Quality: Attentive    Affect:  Blunted  Cognitive:  Appropriate  Insight:  None  Engagement in Group: Minimal  Engagement in Therapy:  None  Modes of Intervention:  Support and Exploration  Summary of Progress/Problems: Eduardo Fuentes  was attentive but not engaged in group process    [name], LCSW 11/04/2011  2:34 PM

## 2011-11-05 MED ORDER — CLONAZEPAM 1 MG PO TABS
1.0000 mg | ORAL_TABLET | ORAL | Status: DC
  Administered 2011-11-05 – 2011-11-06 (×2): 1 mg via ORAL
  Filled 2011-11-05 (×2): qty 1

## 2011-11-05 MED ORDER — HYDROCHLOROTHIAZIDE 12.5 MG PO TABS: 12.5000 mg | ORAL_TABLET | Freq: Every day | ORAL | Status: DC

## 2011-11-05 MED ORDER — GABAPENTIN 300 MG PO CAPS: 300.0000 mg | ORAL_CAPSULE | ORAL | Status: DC

## 2011-11-05 MED ORDER — VENLAFAXINE HCL ER 150 MG PO CP24
150.0000 mg | ORAL_CAPSULE | Freq: Every day | ORAL | Status: DC
  Administered 2011-11-06: 150 mg via ORAL
  Filled 2011-11-05 (×3): qty 1

## 2011-11-05 MED ORDER — LOSARTAN POTASSIUM 50 MG PO TABS: 50.0000 mg | ORAL_TABLET | Freq: Every day | ORAL | Status: DC

## 2011-11-05 MED ORDER — PAROXETINE HCL 40 MG PO TABS: 40.0000 mg | ORAL_TABLET | ORAL | Status: DC

## 2011-11-05 MED ORDER — GABAPENTIN 300 MG PO CAPS
300.0000 mg | ORAL_CAPSULE | ORAL | Status: DC
  Administered 2011-11-05 – 2011-11-06 (×2): 300 mg via ORAL
  Filled 2011-11-05 (×6): qty 1

## 2011-11-05 MED ORDER — CLONAZEPAM 1 MG PO TABS: ORAL_TABLET | ORAL | Status: AC

## 2011-11-05 MED ORDER — CLONAZEPAM 0.5 MG PO TABS: 0.5000 mg | ORAL_TABLET | Freq: Every day | ORAL | Status: DC

## 2011-11-05 MED ORDER — VENLAFAXINE HCL ER 150 MG PO CP24: 150.0000 mg | ORAL_CAPSULE | Freq: Every day | ORAL | Status: DC

## 2011-11-05 MED ORDER — RISPERIDONE 1 MG PO TABS: 1.0000 mg | ORAL_TABLET | Freq: Every day | ORAL | Status: DC

## 2011-11-05 NOTE — Progress Notes (Signed)
D: Patient denies SI/HI. Patient has an anxious mood and affect. Patient rates his depression and hopelessness both a 6 out of 10 (1 low/10 high). The patient reports having a good appetite but states that his energy level is low. The patient has been stating that he feels "too calm" and that he wants to communicate with the MD about "changing his meds".  A: Patient given emotional support from RN. Patient encouraged to come to staff with concerns and/or questions. Patient's medication routine continued. Patient's orders and plan of care reviewed. Patient referred to MD for medication requests.  R: Patient remains appropriate and cooperative. Will continue to monitor patient q15 minutes for safety.

## 2011-11-05 NOTE — Progress Notes (Signed)
[facility] Group Notes:  (Counselor/Nursing/MHT/Case Management/Adjunct)  11/05/2011 3:19 PM  Type of Therapy:  Psychoeducational Skills  Participation Level:  Active  Participation Quality:  Appropriate and Attentive  Affect:  Appropriate  Cognitive:  Alert, Appropriate and Oriented  Insight:  Good  Engagement in Group:  Good  Engagement in Therapy:  n/a  Modes of Intervention:  Activity, Education, Problem-solving, Socialization and Support  Summary of Progress/Problems: Azion attended psycho education group that focused on using quality time with support systems/individuals to engage in healthy coping skills and stregthen the support relationship. Tavin partcipated in activity guessing about self and peers. Khyrie was quiet but attentive while group discussed who their supports are, how they can spend quality time with them as a coping skill and a way to strengthen the relationship. Durrel was given a homework assignment to find two ways to improve their support systems and twenty activities they can do to spend quality time with supports.     Wandra Scot 11/05/2011, 3:19 PM

## 2011-11-05 NOTE — Progress Notes (Signed)
Psychoeducational Group Note  Date:  11/05/2011 Time:  1100  Group Topic/Focus:  Personal Choices and Values:   The focus of this group is to help patients assess and explore the importance of values in their lives, how their values affect their decisions, how they express their values and what opposes their expression.  Participation Level: Did Not Attend  Participation Quality:  Not Applicable  Affect:  Not Applicable  Cognitive:  Not Applicable  Insight:  Not Applicable  Engagement in Group: Not Applicable  Additional Comments:  Pt did not attend group. Pt remained in bed.  Karleen Hampshire Brittini 11/05/2011, 1:25 PM

## 2011-11-05 NOTE — Progress Notes (Signed)
[facility] Group Notes:  (Counselor/Nursing/MHT/Case Management/Adjunct) 11/05/2011  1:15pm-2:30pm   Type of Therapy:  Group Therapy  Participation Level:  Did Not Attend     [name], LCSW 11/05/2011  3:02 PM

## 2011-11-05 NOTE — Progress Notes (Signed)
11/05/2011         Time: 1500      Group Topic/Focus: The focus of the group is on enhancing the patients' ability to utilize positive relaxation strategies by practicing several that can be used at discharge.  Participation Level: Did not attend  Participation Quality: Not Applicable  Affect: Not Applicable  Cognitive: Not Applicable   Additional Comments: Patient reports not feeling well.    Eduardo Fuentes 11/05/2011 4:04 PM

## 2011-11-05 NOTE — Progress Notes (Signed)
Pt observed in his room at this time lying in bed awake.  Pt did not attend evening group tonight and, according to report, only attended discharge planning group this morning.  Pt reports his energy level is low and he still feels very anxious.  He feels the meds are not helping his depression either.  He denies SI/HI/AV at this time and wants to go home.  He is up for discharge tomorrow morning.  Encouraged pt to discuss this with the MD in the morning.  Pt voiced no other needs/concerns.  Pt makes his needs known to staff.  Safety maintained with q15 minute checks.

## 2011-11-05 NOTE — Progress Notes (Signed)
[facility] Group Notes:  (Counselor/Nursing/MHT/Case Management/Adjunct)  Did not attend.  Summary of Progress/Problems:   Gerrit Heck 11/05/2011, 9:50 PM

## 2011-11-05 NOTE — Progress Notes (Signed)
Pt attended discharge planning group and actively participated in group.  SW provided pt with today's workbook.  Pt presents with anxious mood and affect.  Pt rates depression and anxiety at a 6 today.  Pt denies SI.  Pt continues to report anxiety and states that he is only a little better.  Pt states that he feels he would do better at home.  SW contacted IOP to see if pt could return there and they state that pt can not.  Pt is scheduled to follow up at [facility] for medication management and therapy.  No further needs voiced by pt at this time.    Reyes Ivan, LCSWA 11/05/2011  11:02 AM

## 2011-11-05 NOTE — Progress Notes (Signed)
Counselor has received 2 calls from Eduardo Fuentes's wife, Eduardo Fuentes, and was unable to return them until today. Counselor called Eduardo Fuentes back and left a message as there was no answer. According to nurse who spoke with Eduardo Fuentes last night, she wants to have a family session to go over Eduardo Fuentes's medications. Counselor explained on the message left for Eduardo Fuentes that counselor would not have any answers about medications but could discuss with her other concerns. Eduardo Fuentes then left a message while counselor was at lunch, stating that she will be visiting Eduardo Fuentes tonight, and would like to meet with counselor for a few minutes when he is discharged. At this time, counselor has not seen a potential discharge date.   [name], LCSW 11/05/2011  3:02 PM

## 2011-11-05 NOTE — [facility] Suicide Risk Assessment (Signed)
Suicide Risk Assessment  Discharge Assessment     Demographic Factors:  Male and Caucasian  Mental Status Per Nursing Assessment::   On Admission:    At time of Discharge:  Time was spent today discussing with the patient his current symptoms. The patient reports that he is continuing to sleep well without difficulty and reports a good appetite.  The patient reports moderate feelings of sadness, anhedonia and depressed mood and adamantly denies any suicidal or homicidal ideations.  The patient also adamantly denies any auditory or visual hallucinations or delusional thinking. The patient states that his anxiety are moderate and that the current dosage of Klonopin is resulting in some daytime sedation.  The patient is asking for this medication to be decreased at midday and this will be ordered.  The patient denies any other medication related side effects and requested discharge in the morning to outpatient follow up.  This will be ordered per patient request.  Current Mental Status Per Physician:   Diagnosis:  AXIS I:   Major Depressive Disorder - Recurrent.    Generalized Anxiety Disorder.    Panic Disorder with Agoraphobia.  AXIS II:  Deferred.  AXIS III:  1. Hypertension.    2. Seasonal Allergies.    3. Tinnitus.  AXIS IV:  Chronic Mental Illness. Occupational Issues.  AXIS V:  GAF at time of admission approximately 45.  GAF at time of discharge approximately 60.  The patient was seen today and reports the following:   ADL's: Good.  Sleep: The patient reports to sleeping very well at night.  Appetite: The patient reports a good appetite today.   Mild>(1-10) >Severe  Hopelessness (1-10): 6  Depression (1-10): 6  Anxiety (1-10): 6   Suicidal Ideation: The patient adamantly denies any suicidal ideations today.  Plan: No  Intent: No  Means: No   Homicidal Ideation: The patient adamantly denies any homicidal ideations today.  Plan: No  Intent: No.  Means: No   General  Appearance/Behavior: The patient was friendly and cooperative today with this provider but appeared mild to moderately anxious.  Eye Contact: Good.  Speech: Appropriate in rate and volume with no pressuring noted.  Motor Behavior: wnl.  Level of Consciousness: Alert and Oriented x 3.  Mental Status: Alert and Oriented x 3.  Mood: Depressed - Appears moderately depressed.  Affect: Appears moderately anxious.  Anxiety Level: Moderate anxiety reported today but with patient appearing mild to moderately anxious.  Thought Process: wnl  Thought Content: The patient denies any auditory or visual hallucinations today as well as any delusional thinking.  Perception: wnl.  Judgment: Fair to Good.  Insight: Fair to Good.  Cognition: Oriented to person, place and time.   Loss Factors: Decrease in vocational status, Decline in physical health and Financial problems/change in socioeconomic status  Historical Factors: History of treatment resistant anxiety and depression.    Risk Reduction Factors:   Good family support.  Good access to healthcare.  Continued Clinical Symptoms:  Depression:   Anhedonia More than one psychiatric diagnosis Previous Psychiatric Diagnoses and Treatments Medical Diagnoses and Treatments/Surgeries  Discharge Diagnoses:  AXIS I:   Major Depressive Disorder - Recurrent - Severe.    Generalized Anxiety Disorder.    Panic Disorder with Agoraphobia.  AXIS II:  Deferred.  AXIS III:  1. Hypertension.    2. Seasonal Allergies.    3. Tinnitus.    4. Diverticulosis. AXIS IV:  Chronic Mental Illness. Occupational Issues.  AXIS V:  GAF at  time of admission approximately 45. Highest GAF at time of discharge approximately 60.  Cognitive Features That Contribute To Risk:  Thought constriction (tunnel vision)    Current Medications:  Current Facility-Administered Medications   Medication  Dose  Route  Frequency  Provider  Last Rate  Last Dose   .  acetaminophen (TYLENOL)  tablet 1,000 mg  1,000 mg  Oral  Q6H PRN  Kerry Hough, PA   1,000 mg at 11/01/11 2127   .  alum & mag hydroxide-simeth (MAALOX/MYLANTA) 200-200-20 MG/5ML suspension 30 mL  30 mL  Oral  Q4H PRN  Kerry Hough, PA     .  clonazePAM (KLONOPIN) tablet 1 mg  1 mg  Oral  BH-q8a2phs  Kyren Knick D Dutchess Crosland, MD     .  gabapentin (NEURONTIN) capsule 200 mg  200 mg  Oral  BH-q8a2phs  Teena Mangus D Donni Oglesby, MD     .  hydrochlorothiazide (MICROZIDE) capsule 12.5 mg  12.5 mg  Oral  Daily  Curlene Labrum Lanier Felty, MD   12.5 mg at 11/03/11 1610   .  losartan (COZAAR) tablet 50 mg  50 mg  Oral  Daily  Curlene Labrum Tres Grzywacz, MD   50 mg at 11/03/11 9604   .  magnesium hydroxide (MILK OF MAGNESIA) suspension 30 mL  30 mL  Oral  Daily PRN  Kerry Hough, PA     .  PARoxetine (PAXIL) tablet 40 mg  40 mg  Oral  BH-q7a  Curlene Labrum Inella Kuwahara, MD   40 mg at 11/03/11 0622   .  QUEtiapine (SEROQUEL) tablet 25 mg  25 mg  Oral  Q8H PRN  Sanjuana Kava, NP   25 mg at 11/01/11 0600   .  venlafaxine XR (EFFEXOR-XR) 24 hr capsule 75 mg  75 mg  Oral  Q breakfast  Curlene Labrum Jeriko Kowalke, MD     .  DISCONTD: clonazePAM (KLONOPIN) tablet 0.5 mg  0.5 mg  Oral  BH-q8a2phs  Jorje Guild, PA-C   0.5 mg at 11/03/11 5409   .  DISCONTD: QUEtiapine (SEROQUEL) tablet 50 mg  50 mg  Oral  BH-q8a2phs  Jorje Guild, PA-C   50 mg at 11/03/11 8119   .  DISCONTD: venlafaxine XR (EFFEXOR-XR) 24 hr capsule 37.5 mg  37.5 mg  Oral  Q breakfast  Curlene Labrum Terisa Belardo, MD   37.5 mg at 11/03/11 1478    Lab Results: No results found for this or any previous visit (from the past 48 hour(s)).   Review of Systems:  Neurological: The patient denies any headaches today. He denies any seizures or dizziness.  G.I.: The patient denies any constipation today or G.I. Upset.  Musculoskeletal: The patient denies any muscle or skeletal difficulties today.   Time was spent today discussing with the patient his current symptoms. The patient reports that he is continuing to sleep well without difficulty and  reports a good appetite.  The patient reports moderate feelings of sadness, anhedonia and depressed mood and adamantly denies any suicidal or homicidal ideations.  The patient also adamantly denies any auditory or visual hallucinations or delusional thinking. The patient states that his anxiety are moderate and that the current dosage of Klonopin is resulting in some daytime sedation.  The patient is asking for this medication to be decreased at midday and this will be ordered.  The patient denies any other medication related side effects and requested discharge in the morning to outpatient follow up.  This will be  ordered.  Treatment Plan Summary:  1. Daily contact with patient to assess and evaluate symptoms and progress in treatment  2. Medication management  3. The patient will deny suicidal ideations or homicidal ideations for 48 hours prior to discharge and have a depression and anxiety rating of 3 or less. The patient will also deny any auditory or visual hallucinations or delusional thinking and display no manic or hypomanic behaviors.  4. The patient will deny any symptoms of substance withdrawal at time of discharge.   Plan:  1. Will continue the patient on the medication Paxil at 40 mgs po q am for depression and anxiety.  2. Will increase the medication Effexor XR to 150 mgs po q am to further address the patient's depressive symptoms.  3. Will decrease the medication Klonopin to 1 mg po q am and 1/2 tablet at 2 pm and 1 mg po qhs to address his anxiety symptoms and decreased due to complaints of daytime sedation.  4. Will increase the medication Neurontin at 300 mgs po q am, 2 pm and hs to further address his anxiety symptoms.  5. Laboratory Studies reviewed.  6. Will continue to monitor.  7. The patient will be discharged in the morning at this request to outpatient follow up.  Suicide Risk:  Minimal: No identifiable suicidal ideation.  Patients presenting with no risk factors but  with morbid ruminations; may be classified as minimal risk based on the severity of the depressive symptoms  Plan Of Care/Follow-up recommendations:  Activity:  As tolerated. Diet:  Heart Healthy Diet. Other:  Please take all medications only as directed and keep all scheduled follow up appointments.  Please return to your local Emergency Room should you have any thoughts of harming yourself or others.  Eduardo Fuentes 11/05/2011, 4:38 PM

## 2011-11-06 NOTE — Progress Notes (Signed)
Pt discharged per MD orders; pt currently denies SI/HI and auditory/visual hallucinations; pt was given education by RN regarding follow-up appointments and medications and pt denied any questions or concerns about these instructions; pt was then escorted to search room to retrieve his belongings by RN before being discharged to hospital lobby. 

## 2011-11-06 NOTE — Progress Notes (Signed)
[facility] Case Management Discharge Plan:  Will you be returning to the same living situation after discharge: Yes,  returning home At discharge, do you have transportation home?:Yes,  wife will pick pt up Do you have the ability to pay for your medications:Yes,  access to meds   Release of information consent forms completed and in the chart;  Patient's signature needed at discharge.  Patient to Follow up at:  Follow-up Information    Follow up with [facility] - [facility] on 11/07/2011. (Appointment scheduled at 1:00 pm)    Contact information:   [address], [address] [place], [place]  [address] [phone]      Follow up with [facility] - Dr. Evelene Croon on 12/11/2011. (Appointment scheduled at 5:15 pm)    Contact information:   [address], [address] [place], [place]  [address] [phone]         Patient denies SI/HI:   Yes,  denies SI/HI    Safety Planning and Suicide Prevention discussed:  Yes,  discussed with pt today  Barrier to discharge identified:No.  Summary and Recommendations: Pt attended discharge planning group and actively participated in group.  SW provided pt with today's workbook.  Pt presents with anxious mood and affect.  Pt rates depression and anxiety at a 6 today.  Pt denies SI/HI.  Pt reports feeling stable to d/c today.  No recommendations from SW.  No further needs voiced by pt.  Pt stable to discharge.     Carmina Miller 11/06/2011, 9:36 AM

## 2011-11-06 NOTE — Tx Team (Signed)
Interdisciplinary Treatment Plan Update (Adult)  Date:  11/06/2011  Time Reviewed:  9:33 AM   Progress in Treatment: Attending groups: Yes Participating in groups:  Yes Taking medication as prescribed: Yes Tolerating medication:  Yes Family/Significant other contact made: Yes Patient understands diagnosis:  Yes Discussing patient identified problems/goals with staff:  Yes Medical problems stabilized or resolved:  Yes Denies suicidal/homicidal ideation: Yes Issues/concerns per patient self-inventory:  None identified Other: N/A  New problem(s) identified: None Identified  Reason for Continuation of Hospitalization: Stable to d/c  Interventions implemented related to continuation of hospitalization: Stable to d/c  Additional comments: N/A  Estimated length of stay: D/C today  Discharge Plan: Pt will follow up with [facility] for medication management and therapy.    New goal(s): N/A  Review of initial/current patient goals per problem list:    1.  Goal(s): Reduce depressive symptoms  Met:  Yes  Target date: by discharge  As evidenced by: Reducing depression from a 10 to a 3 as reported by pt.  Pt rates at a 6 today but reports feeling stable to d/c.   2.  Goal (s): Reduce/Eliminate suicidal ideation  Met:  Yes  Target date: by discharge  As evidenced by: pt reporting no SI.    3.  Goal(s): Reduce anxiety symptoms  Met:  Yes  Target date: by discharge  As evidenced by: Reduce anxiety from a 10 to a 3 as reported by pt. Pt rates at a 6 today but reports feeling stable to d/c.     Attendees: Patient:  Eduardo Fuentes  11/06/2011 9:33 AM   Family:     Physician:  Franchot Gallo, MD 11/06/2011 9:33 AM   Nursing:   Berneice Heinrich, RN 11/06/2011 9:33 AM   Case Manager:  Reyes Ivan, LCSWA 11/06/2011 9:33 AM   Counselor:  [name], LCSW 11/06/2011 9:33 AM   Other:  Juline Patch, LCSW 11/06/2011 9:33 AM   Other: Boykin Nearing, MSW intern  11/06/2011 10:26 AM   Other:     Other:      Scribe for Treatment Team:   Carmina Miller, 11/06/2011 9:33 AM

## 2011-11-07 NOTE — Progress Notes (Signed)
Patient Discharge Instructions:  After Visit Summary (AVS):   Faxed to:  11/07/2011 Psychiatric Admission Assessment Note:   Faxed to:  11/07/2011 Suicide Risk Assessment - Discharge Assessment:   Faxed to:  11/07/2011 Faxed/Sent to the Next Level Care provider:  11/07/2011  Faxed to [facility], Dr. Evelene Croon @ [phone]  Heloise Purpura, Eduard Clos, 11/07/2011, 3:46 PM

## 2011-11-16 NOTE — Discharge Summary (Signed)
Physician Discharge Summary Note  Patient:  Eduardo Fuentes is an 61 y.o., male MRN:  [id number] DOB:  10/17/1950 Patient phone:  [phone] (home)  Patient address:   [address] [place] [address]   Date of Admission:  10/28/2011 Date of Discharge: 11/06/2011  Discharge Diagnoses: Principal Problem:  *Major depressive disorder, recurrent episode Active Problems:  Generalized anxiety disorder  Panic disorder with agoraphobia  Axis Diagnosis:  AXIS I: Major Depressive Disorder - Recurrent - Severe.  Generalized Anxiety Disorder.  Panic Disorder with Agoraphobia.  AXIS II: Deferred.  AXIS III: 1. Hypertension.  2. Seasonal Allergies.  3. Tinnitus.  4. Diverticulosis.  AXIS IV: Chronic Mental Illness. Occupational Issues.  AXIS V: GAF at time of admission approximately 45. Highest GAF at time of discharge approximately 60.   Level of Care:  Inpatient Hospitalization.  Reason For Admission: Pt presented to [facility] on voluntary admission status in response to a 1 week history of suicidal ideation. Pt states that although he has suicidal thoughts, his concern regarding how his suicide would negatively impact his wife, concern for his pet dog, and "too many positive things in my life" keeps him from moving from fleeting thoughts of suicide to developing an actual plan. Pt admonishes anxiety and "panicky feeling" waking him daily and causing nausea. He states that he feels "hopeless" as he feels that his depressive and anxiety symptoms will not improve. He has feelings of guilt regarding his current psychiatric state of mind.  Hospital Course:   The patient attended treatment team meeting this am and met with treatment team members. The patient's symptoms, treatment plan and response to treatment was discussed. The patient endorsed that their symptoms have improved. The patient also stated that they felt stable for discharge.  They reported that from this hospital stay they had  learned many coping skills.  In other to maintain their psychiatric stability, they will continue psychiatric care on an outpatient basis. They will follow-up as outlined below.  In addition they were instructed  to take all your medications as prescribed by their mental healthcare provider and to report any adverse effects and or reactions from your medicines to their outpatient provider promptly.  The patient is also instructed and cautioned to not engage in alcohol and or illegal drug use while on prescription medicines.  In the event of worsening symptoms the patient is instructed to call the crisis hotline, 911 and or go to the nearest ED for appropriate evaluation and treatment of symptoms.   Also while a patient in this hospital, the patient received medication management for his psychiatric symptoms. They were ordered and received as outlined below:    Medication List     As of 11/16/2011  9:42 PM    STOP taking these medications         acetaminophen 500 MG tablet   Commonly known as: TYLENOL      ALPRAZolam 0.5 MG tablet   Commonly known as: XANAX      desvenlafaxine 50 MG 24 hr tablet   Commonly known as: PRISTIQ   Replaced by: venlafaxine XR 150 MG 24 hr capsule      hydrochlorothiazide 12.5 MG capsule   Commonly known as: MICROZIDE      TAKE these medications      Indication    clonazePAM 1 MG tablet   Commonly known as: KLONOPIN   Take 1 tablet at 8 am, 1/2 tablet at 2 pm and 1 tablet at bedtime for  anxiety.       gabapentin 300 MG capsule   Commonly known as: NEURONTIN   Take 1 capsule (300 mg total) by mouth 3 (three) times daily at 8am, 2pm and bedtime. For anxiety and mood stabilization.       hydrochlorothiazide 12.5 MG tablet   Commonly known as: HYDRODIURIL   Take 1 tablet (12.5 mg total) by mouth daily. For blood pressure control.       losartan 50 MG tablet   Commonly known as: COZAAR   Take 1 tablet (50 mg total) by mouth daily. For blood pressure  control.       PARoxetine 40 MG tablet   Commonly known as: PAXIL   Take 1 tablet (40 mg total) by mouth every morning. For depression and anxiety.       risperiDONE 1 MG tablet   Commonly known as: RISPERDAL   Take 1 tablet (1 mg total) by mouth at bedtime. For sleep and anxiety and mood stabilization.    Indication: Obsessive Compulsive Disorder      venlafaxine XR 150 MG 24 hr capsule   Commonly known as: EFFEXOR-XR   Take 1 capsule (150 mg total) by mouth daily with breakfast. For depression and anxiety.        They were also enrolled in group counseling sessions and activities in which they participated actively.       Follow-up Information    Follow up with [facility] - Hurley Cisco on 11/07/2011. (Appointment scheduled at 1:00 pm)    Contact information:   [address], [address] [place], [place]  [address] [phone]      Follow up with [facility] - Dr. Evelene Croon on 12/11/2011. (Appointment scheduled at 5:15 pm)    Contact information:   [address], [address] [place], [place]  [address] [phone]        Upon discharge, patient adamantly denies suicidal, homicidal ideations, auditory, visual hallucinations and or delusional thinking. They left [facility] with all personal belongings via personal transportation in no apparent distress.  Consults:  Please see electronic medical record for details.  Significant Diagnostic Studies:  Please see electronic medical record for details.  Discharge Vitals:   Blood pressure 115/76, pulse 98, temperature 98.1 F (36.7 C), temperature source Oral, resp. rate 16, height 5' 8.1" (1.73 m), weight 88.905 kg (196 lb), SpO2 100.00%..  Mental Status Exam: Demographic Factors:  Male and Caucasian  Mental Status Per Nursing Assessment::  On Admission:  At time of Discharge: Time was spent today discussing with the patient his current symptoms. The patient reports that he is continuing to sleep well  without difficulty and reports a good appetite. The patient reports moderate feelings of sadness, anhedonia and depressed mood and adamantly denies any suicidal or homicidal ideations. The patient also adamantly denies any auditory or visual hallucinations or delusional thinking. The patient states that his anxiety are moderate and that the current dosage of Klonopin is resulting in some daytime sedation. The patient is asking for this medication to be decreased at midday and this will be ordered. The patient denies any other medication related side effects and requested discharge in the morning to outpatient follow up. This will be ordered per patient request.  Current Mental Status Per Physician:  Diagnosis:  AXIS I: Major Depressive Disorder - Recurrent.  Generalized Anxiety Disorder.  Panic Disorder with Agoraphobia.  AXIS II: Deferred.  AXIS III: 1. Hypertension.  2. Seasonal Allergies.  3. Tinnitus.  AXIS IV: Chronic Mental Illness. Occupational Issues.  AXIS V: GAF at time of admission approximately 45. GAF at time of discharge approximately 60.  The patient was seen today and reports the following:  ADL's: Good.  Sleep: The patient reports to sleeping very well at night.  Appetite: The patient reports a good appetite today.  Mild>(1-10) >Severe  Hopelessness (1-10): 6  Depression (1-10): 6  Anxiety (1-10): 6  Suicidal Ideation: The patient adamantly denies any suicidal ideations today.  Plan: No  Intent: No  Means: No  Homicidal Ideation: The patient adamantly denies any homicidal ideations today.  Plan: No  Intent: No.  Means: No  General Appearance/Behavior: The patient was friendly and cooperative today with this provider but appeared mild to moderately anxious.  Eye Contact: Good.  Speech: Appropriate in rate and volume with no pressuring noted.  Motor Behavior: wnl.  Level of Consciousness: Alert and Oriented x 3.  Mental Status: Alert and Oriented x 3.  Mood: Depressed  - Appears moderately depressed.  Affect: Appears moderately anxious.  Anxiety Level: Moderate anxiety reported today but with patient appearing mild to moderately anxious.  Thought Process: wnl  Thought Content: The patient denies any auditory or visual hallucinations today as well as any delusional thinking.  Perception: wnl.  Judgment: Fair to Good.  Insight: Fair to Good.  Cognition: Oriented to person, place and time.  Loss Factors:  Decrease in vocational status, Decline in physical health and Financial problems/change in socioeconomic status  Historical Factors:  History of treatment resistant anxiety and depression.  Risk Reduction Factors:  Good family support. Good access to healthcare.  Continued Clinical Symptoms:  Depression: Anhedonia  More than one psychiatric diagnosis  Previous Psychiatric Diagnoses and Treatments  Medical Diagnoses and Treatments/Surgeries  Discharge Diagnoses:  AXIS I: Major Depressive Disorder - Recurrent - Severe.  Generalized Anxiety Disorder.  Panic Disorder with Agoraphobia.  AXIS II: Deferred.  AXIS III: 1. Hypertension.  2. Seasonal Allergies.  3. Tinnitus.  4. Diverticulosis.  AXIS IV: Chronic Mental Illness. Occupational Issues.  AXIS V: GAF at time of admission approximately 45. Highest GAF at time of discharge approximately 60.  Cognitive Features That Contribute To Risk:  Thought constriction (tunnel vision)  Current Medications:  Current Facility-Administered Medications   Medication  Dose  Route  Frequency  Provider  Last Rate  Last Dose   .  acetaminophen (TYLENOL) tablet 1,000 mg  1,000 mg  Oral  Q6H PRN  Kerry Hough, PA   1,000 mg at 11/01/11 2127   .  alum & mag hydroxide-simeth (MAALOX/MYLANTA) 200-200-20 MG/5ML suspension 30 mL  30 mL  Oral  Q4H PRN  Kerry Hough, PA     .  clonazePAM (KLONOPIN) tablet 1 mg  1 mg  Oral  [id number]  Anaid Haney D Sequoia Mincey, MD     .  gabapentin (NEURONTIN) capsule 200 mg  200 mg  Oral   BH-[id number]  Dayven Linsley D Naaman Curro, MD     .  hydrochlorothiazide (MICROZIDE) capsule 12.5 mg  12.5 mg  Oral  Daily  Curlene Labrum Darrill Vreeland, MD   12.5 mg at 11/03/11 1610   .  losartan (COZAAR) tablet 50 mg  50 mg  Oral  Daily  Curlene Labrum Vanesha Athens, MD   50 mg at 11/03/11 9604   .  magnesium hydroxide (MILK OF MAGNESIA) suspension 30 mL  30 mL  Oral  Daily PRN  Kerry Hough, PA     .  PARoxetine (PAXIL)  tablet 40 mg  40 mg  Oral  BH-q7a  Curlene Labrum Ramaj Frangos, MD   40 mg at 11/03/11 0622   .  QUEtiapine (SEROQUEL) tablet 25 mg  25 mg  Oral  Q8H PRN  Sanjuana Kava, NP   25 mg at 11/01/11 0600   .  venlafaxine XR (EFFEXOR-XR) 24 hr capsule 75 mg  75 mg  Oral  Q breakfast  Curlene Labrum Aasiyah Auerbach, MD     .  DISCONTD: clonazePAM (KLONOPIN) tablet 0.5 mg  0.5 mg  Oral  [id number]  Jorje Guild, PA-C   0.5 mg at 11/03/11 4098   .  DISCONTD: QUEtiapine (SEROQUEL) tablet 50 mg  50 mg  Oral  [id number]  Jorje Guild, PA-C   50 mg at 11/03/11 1191   .  DISCONTD: venlafaxine XR (EFFEXOR-XR) 24 hr capsule 37.5 mg  37.5 mg  Oral  Q breakfast  Curlene Labrum Jonae Renshaw, MD   37.5 mg at 11/03/11 4782   Lab Results: No results found for this or any previous visit (from the past 48 hour(s)).  Review of Systems:  Neurological: The patient denies any headaches today. He denies any seizures or dizziness.  G.I.: The patient denies any constipation today or G.I. Upset.  Musculoskeletal: The patient denies any muscle or skeletal difficulties today.  Time was spent today discussing with the patient his current symptoms. The patient reports that he is continuing to sleep well without difficulty and reports a good appetite. The patient reports moderate feelings of sadness, anhedonia and depressed mood and adamantly denies any suicidal or homicidal ideations. The patient also adamantly denies any auditory or visual hallucinations or delusional thinking. The patient states that his anxiety are moderate and that the current dosage of Klonopin is resulting in some  daytime sedation. The patient is asking for this medication to be decreased at midday and this will be ordered. The patient denies any other medication related side effects and requested discharge in the morning to outpatient follow up. This will be ordered.  Treatment Plan Summary:  1. Daily contact with patient to assess and evaluate symptoms and progress in treatment  2. Medication management  3. The patient will deny suicidal ideations or homicidal ideations for 48 hours prior to discharge and have a depression and anxiety rating of 3 or less. The patient will also deny any auditory or visual hallucinations or delusional thinking and display no manic or hypomanic behaviors.  4. The patient will deny any symptoms of substance withdrawal at time of discharge.  Plan:  1. Will continue the patient on the medication Paxil at 40 mgs po q am for depression and anxiety.  2. Will increase the medication Effexor XR to 150 mgs po q am to further address the patient's depressive symptoms.  3. Will decrease the medication Klonopin to 1 mg po q am and 1/2 tablet at 2 pm and 1 mg po qhs to address his anxiety symptoms and decreased due to complaints of daytime sedation.  4. Will increase the medication Neurontin at 300 mgs po q am, 2 pm and hs to further address his anxiety symptoms.  5. Laboratory Studies reviewed.  6. Will continue to monitor.  7. The patient will be discharged in the morning at this request to outpatient follow up.  Suicide Risk:  Minimal: No identifiable suicidal ideation. Patients presenting with no risk factors but with morbid ruminations; may be classified as minimal risk based on the severity of the depressive symptoms  Plan  Of Care/Follow-up recommendations:  Activity: As tolerated.  Diet: Heart Healthy Diet.  Other: Please take all medications only as directed and keep all scheduled follow up appointments. Please return to your local Emergency Room should you have any thoughts of  harming yourself or others.  Discharge destination:  Home  Is patient on multiple antipsychotic therapies at discharge:  No  Has Patient had three or more failed trials of antipsychotic monotherapy by history: N/A Recommended Plan for Multiple Antipsychotic Therapies: N/A Discharge Orders    Future Orders Please Complete By Expires   Diet - low sodium heart healthy      Increase activity slowly      Discharge instructions      Comments:   Please take all medications only as directed and keep all scheduled follow up appointments.  Please return to your local Emergency Room should you have any thoughts of harming yourself or others.       Medication List     As of 11/16/2011  9:42 PM    STOP taking these medications         acetaminophen 500 MG tablet   Commonly known as: TYLENOL      ALPRAZolam 0.5 MG tablet   Commonly known as: XANAX      desvenlafaxine 50 MG 24 hr tablet   Commonly known as: PRISTIQ   Replaced by: venlafaxine XR 150 MG 24 hr capsule      hydrochlorothiazide 12.5 MG capsule   Commonly known as: MICROZIDE      TAKE these medications      Indication    clonazePAM 1 MG tablet   Commonly known as: KLONOPIN   Take 1 tablet at 8 am, 1/2 tablet at 2 pm and 1 tablet at bedtime for anxiety.       gabapentin 300 MG capsule   Commonly known as: NEURONTIN   Take 1 capsule (300 mg total) by mouth 3 (three) times daily at 8am, 2pm and bedtime. For anxiety and mood stabilization.       hydrochlorothiazide 12.5 MG tablet   Commonly known as: HYDRODIURIL   Take 1 tablet (12.5 mg total) by mouth daily. For blood pressure control.       losartan 50 MG tablet   Commonly known as: COZAAR   Take 1 tablet (50 mg total) by mouth daily. For blood pressure control.       PARoxetine 40 MG tablet   Commonly known as: PAXIL   Take 1 tablet (40 mg total) by mouth every morning. For depression and anxiety.       risperiDONE 1 MG tablet   Commonly known as: RISPERDAL    Take 1 tablet (1 mg total) by mouth at bedtime. For sleep and anxiety and mood stabilization.    Indication: Obsessive Compulsive Disorder      venlafaxine XR 150 MG 24 hr capsule   Commonly known as: EFFEXOR-XR   Take 1 capsule (150 mg total) by mouth daily with breakfast. For depression and anxiety.            Follow-up Information    Follow up with [facility] - Hurley Cisco on 11/07/2011. (Appointment scheduled at 1:00 pm)    Contact information:   [address], [address] [place], [place]  [address] [phone]      Follow up with [facility] - Dr. Evelene Croon on 12/11/2011. (Appointment scheduled at 5:15 pm)    Contact information:   [address]  [address] [place], [place]  [address] [phone]        Follow-up recommendations:   Activities: Resume typical activities Diet: Resume typical diet Other: Follow up with outpatient provider and report any side effects to out patient prescriber.  Comments:  Take all your medications as prescribed by your mental healthcare provider. Report any adverse effects and or reactions from your medicines to your outpatient provider promptly. Patient is instructed and cautioned to not engage in alcohol and or illegal drug use while on prescription medicines. In the event of worsening symptoms, patient is instructed to call the crisis hotline, 911 and or go to the nearest ED for appropriate evaluation and treatment of symptoms. Follow-up with your primary care provider for your other medical issues, concerns and or health care needs.  Signed: Franchot Gallo 11/16/2011 9:42 PM

## 2011-12-04 ENCOUNTER — Ambulatory Visit: Payer: Self-pay | Admitting: Emergency Medicine

## 2011-12-04 VITALS — BP 104/78 | HR 116 | Temp 98.0°F | Resp 18 | Ht 68.75 in | Wt 196.6 lb

## 2011-12-04 DIAGNOSIS — J019 Acute sinusitis, unspecified: Secondary | ICD-10-CM

## 2011-12-04 DIAGNOSIS — J309 Allergic rhinitis, unspecified: Secondary | ICD-10-CM

## 2011-12-04 DIAGNOSIS — J329 Chronic sinusitis, unspecified: Secondary | ICD-10-CM

## 2011-12-04 DIAGNOSIS — R059 Cough, unspecified: Secondary | ICD-10-CM

## 2011-12-04 DIAGNOSIS — R05 Cough: Secondary | ICD-10-CM

## 2011-12-04 DIAGNOSIS — J029 Acute pharyngitis, unspecified: Secondary | ICD-10-CM

## 2011-12-04 MED ORDER — AMOXICILLIN 875 MG PO TABS: 875.0000 mg | ORAL_TABLET | Freq: Two times a day (BID) | ORAL | Status: DC

## 2011-12-04 MED ORDER — BENZONATATE 100 MG PO CAPS: 100.0000 mg | ORAL_CAPSULE | Freq: Three times a day (TID) | ORAL | Status: DC | PRN

## 2011-12-04 MED ORDER — FLUTICASONE PROPIONATE 50 MCG/ACT NA SUSP: 2.0000 | Freq: Every day | NASAL | Status: DC

## 2011-12-04 NOTE — Patient Instructions (Signed)
Allergic Rhinitis Allergic rhinitis is when the mucous membranes in the nose respond to allergens. Allergens are particles in the air that cause your body to have an allergic reaction. This causes you to release allergic antibodies. Through a chain of events, these eventually cause you to release histamine into the blood stream (hence the use of antihistamines). Although meant to be protective to the body, it is this release that causes your discomfort, such as frequent sneezing, congestion and an itchy runny nose.  CAUSES  The pollen allergens may come from grasses, trees, and weeds. This is seasonal allergic rhinitis, or "hay fever." Other allergens cause year-round allergic rhinitis (perennial allergic rhinitis) such as house dust mite allergen, pet dander and mold spores.  SYMPTOMS Sinusitis Sinusitis is redness, soreness, and swelling (inflammation) of the paranasal sinuses. Paranasal sinuses are air pockets within the bones of your face (beneath the eyes, the middle of the forehead, or above the eyes). In healthy paranasal sinuses, mucus is able to drain out, and air is able to circulate through them by way of your nose. However, when your paranasal sinuses are inflamed, mucus and air can become trapped. This can allow bacteria and other germs to grow and cause infection. Sinusitis can develop quickly and last only a short time (acute) or continue over a long period (chronic). Sinusitis that lasts for more than 12 weeks is considered chronic.  CAUSES  Causes of sinusitis include:  Allergies.  Structural abnormalities, such as displacement of the cartilage that separates your nostrils (deviated septum), which can decrease the air flow through your nose and sinuses and affect sinus drainage.  Functional abnormalities, such as when the small hairs (cilia) that line your sinuses and help remove mucus do not work properly or are not present. SYMPTOMS  Symptoms of acute and chronic sinusitis are the  same. The primary symptoms are pain and pressure around the affected sinuses. Other symptoms include:  Upper toothache.  Earache.  Headache.  Bad breath.  Decreased sense of smell and taste.  A cough, which worsens when you are lying flat.  Fatigue.  Fever.  Thick drainage from your nose, which often is green and may contain pus (purulent).  Swelling and warmth over the affected sinuses. DIAGNOSIS  Your caregiver will perform a physical exam. During the exam, your caregiver may:  Look in your nose for signs of abnormal growths in your nostrils (nasal polyps).  Tap over the affected sinus to check for signs of infection.  View the inside of your sinuses (endoscopy) with a special imaging device with a light attached (endoscope), which is inserted into your sinuses. If your caregiver suspects that you have chronic sinusitis, one or more of the following tests may be recommended:  Allergy tests.  Nasal culture A sample of mucus is taken from your nose and sent to a lab and screened for bacteria.  Nasal cytology A sample of mucus is taken from your nose and examined by your caregiver to determine if your sinusitis is related to an allergy. TREATMENT  Most cases of acute sinusitis are related to a viral infection and will resolve on their own within 10 days. Sometimes medicines are prescribed to help relieve symptoms (pain medicine, decongestants, nasal steroid sprays, or saline sprays).  However, for sinusitis related to a bacterial infection, your caregiver will prescribe antibiotic medicines. These are medicines that will help kill the bacteria causing the infection.  Rarely, sinusitis is caused by a fungal infection. In theses cases, your caregiver  will prescribe antifungal medicine. For some cases of chronic sinusitis, surgery is needed. Generally, these are cases in which sinusitis recurs more than 3 times per year, despite other treatments. HOME CARE INSTRUCTIONS   Drink  plenty of water. Water helps thin the mucus so your sinuses can drain more easily.  Use a humidifier.  Inhale steam 3 to 4 times a day (for example, sit in the bathroom with the shower running).  Apply a warm, moist washcloth to your face 3 to 4 times a day, or as directed by your caregiver.  Use saline nasal sprays to help moisten and clean your sinuses.  Take over-the-counter or prescription medicines for pain, discomfort, or fever only as directed by your caregiver. SEEK IMMEDIATE MEDICAL CARE IF:  You have increasing pain or severe headaches.  You have nausea, vomiting, or drowsiness.  You have swelling around your face.  You have vision problems.  You have a stiff neck.  You have difficulty breathing. MAKE SURE YOU:   Understand these instructions.  Will watch your condition.  Will get help right away if you are not doing well or get worse. Document Released: 02/10/2005 Document Revised: 05/05/2011 Document Reviewed: 02/25/2011 [facility] Patient Information 2013 [place], [place].   Nasal stuffiness (congestion).  Runny, itchy nose with sneezing and tearing of the eyes.  There is often an itching of the mouth, eyes and ears. It cannot be cured, but it can be controlled with medications. DIAGNOSIS  If you are unable to determine the offending allergen, skin or blood testing may find it. TREATMENT   Avoid the allergen.  Medications and allergy shots (immunotherapy) can help.  Hay fever may often be treated with antihistamines in pill or nasal spray forms. Antihistamines block the effects of histamine. There are over-the-counter medicines that may help with nasal congestion and swelling around the eyes. Check with your caregiver before taking or giving this medicine. If the treatment above does not work, there are many new medications your caregiver can prescribe. Stronger medications may be used if initial measures are ineffective. Desensitizing injections can be  used if medications and avoidance fails. Desensitization is when a patient is given ongoing shots until the body becomes less sensitive to the allergen. Make sure you follow up with your caregiver if problems continue. SEEK MEDICAL CARE IF:   You develop fever (more than 100.5 F (38.1 C).  You develop a cough that does not stop easily (persistent).  You have shortness of breath.  You start wheezing.  Symptoms interfere with normal daily activities. Document Released: 11/05/2000 Document Revised: 05/05/2011 Document Reviewed: 05/17/2008 [facility] Patient Information 2013 [place], [place].

## 2011-12-04 NOTE — Progress Notes (Signed)
  Subjective:    Patient ID: Eduardo Fuentes, male    DOB: [birth year], 61 y.o.   MRN: [id number]  HPI patient with a history of seasonal allergies presents with facial pain purulent nasal drainage and a yellowish color. He has had some mild cough but not severe. He denies any fever or chills. He thinks he starts at this time a year with allergies and then progresses to have a sinus infection. On 12 antibiotics the    Review of Systems     Objective:   Physical Exam TMs are clear. Nose is congested with mild purulent drainage. Throat is normal. Chest is clear to auscultation and percussion. In the upper lobes however there loud rales in both bases. Results for orders placed in visit on 12/04/11  POCT RAPID STREP A (OFFICE)      Component Value Range   Rapid Strep A Screen Negative  Negative         Assessment & Plan:  Treated with Flonase and amoxicillin for sinusitis. He was given Jerilynn Som for cough.

## 2012-07-15 ENCOUNTER — Encounter: Payer: Self-pay | Admitting: Internal Medicine

## 2012-07-15 ENCOUNTER — Other Ambulatory Visit: Payer: Self-pay | Admitting: Family Medicine

## 2012-07-15 ENCOUNTER — Ambulatory Visit
Admission: RE | Admit: 2012-07-15 | Discharge: 2012-07-15 | Disposition: A | Discharge status: Home / Self Care | Payer: BC Managed Care – PPO | Source: Ambulatory Visit | Attending: Family Medicine | Admitting: Family Medicine

## 2012-07-15 DIAGNOSIS — R0989 Other specified symptoms and signs involving the circulatory and respiratory systems: Secondary | ICD-10-CM

## 2012-07-25 DIAGNOSIS — C61 Malignant neoplasm of prostate: Secondary | ICD-10-CM

## 2012-07-25 HISTORY — DX: Malignant neoplasm of prostate: C61

## 2012-09-03 ENCOUNTER — Encounter: Payer: Self-pay | Admitting: Radiation Oncology

## 2012-09-03 DIAGNOSIS — C61 Malignant neoplasm of prostate: Secondary | ICD-10-CM | POA: Insufficient documentation

## 2012-09-03 NOTE — Progress Notes (Signed)
GU Location of Tumor / Histology: adenocarcinoma of the prostate  If Prostate Cancer, Gleason Score is (4 + 3=7) and PSA is (3.25) on 06/29/2012  Patient presented 01/09/2012 with and enlarged prostate  Biopsies of prostate (if applicable) revealed: Adenocarcinoma , 3 cores. 4+3=7, 1 core, 25%                   3+4=7, 1 core, 20%                   3+3=6, 4 cores, 5%  Prostate volume: 29.9 cc  Past/Anticipated interventions by urology, if any: Margarita Grizzle recommends surgical intervention and is concerened about high risk for extra capsular extension that may not be well treated with brachytherapy  Past/Anticipated interventions by medical oncology, if any: None  Weight changes, if any: None noted  Bowel/Bladder complaints, if any: Denies hematuria or dysuria; family hx of prostate ca with elevated PSA  Nausea/Vomiting, if any: None noted  Pain issues, if any:  None noted  SAFETY ISSUES:  Prior radiation? NO  Pacemaker/ICD? NO  Possible current pregnancy? NO  Is the patient on methotrexate? NO  Current Complaints / other details:  62 years old. Married. 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

## 2012-09-06 ENCOUNTER — Ambulatory Visit
Admission: RE | Admit: 2012-09-06 | Discharge: 2012-09-06 | Disposition: A | Discharge status: Home / Self Care | Payer: BC Managed Care – PPO | Source: Ambulatory Visit | Attending: Radiation Oncology | Admitting: Radiation Oncology

## 2012-09-06 ENCOUNTER — Encounter: Payer: Self-pay | Admitting: Radiation Oncology

## 2012-09-06 VITALS — BP 116/74 | HR 89 | Temp 97.9°F | Resp 18 | Ht 69.0 in | Wt 194.1 lb

## 2012-09-06 DIAGNOSIS — C61 Malignant neoplasm of prostate: Secondary | ICD-10-CM

## 2012-09-06 NOTE — Progress Notes (Signed)
Radiation Oncology         [phone] ________________________________  Initial outpatient Consultation  Name: SHILOH SWOPES MRN: [id number]  Date: 09/06/2012  DOB: 10-17-1950  WU:[id number],NWGNF, MD  Milford Cage,*   REFERRING PHYSICIAN: Milford Cage,*  DIAGNOSIS: 62 y.o. gentleman with stage T1c adenocarcinoma of the prostate with a Gleason's score of 4+3 and a PSA of 3.25  HISTORY OF PRESENT ILLNESS::Levy D Ganas is a 62 y.o. gentleman.  He was noted to have a rising PSA doubling in 2 years up to 3.25 by his primary care physician, Dr. Docia Chuck.  Given a family history of prostate cancer, he was referred for evaluation in urology by Dr. Margarita Grizzle on 01/08/13,  digital rectal examination was performed at that time revealing 35 gm prostate without nodule.  The patient proceeded to transrectal ultrasound with 12 biopsies of the prostate on 08/10/12.  The prostate volume measured 29.9 cc.  Out of 12 core biopsies, 6 were positive.  The maximum Gleason score was 4+3, and this was seen in left lateral apex.  The patient reviewed the biopsy results with his urologist and he has kindly been referred today for discussion of potential radiation treatment options.   PREVIOUS RADIATION THERAPY: No  PAST MEDICAL HISTORY:  has a past medical history of Tinnitus; Depression; Anxiety; Diverticulosis; Colon polyps; Tinnitus; High blood pressure; and Microscopic hematuria.    PAST SURGICAL HISTORY: Past Surgical History  Procedure Laterality Date  . Lithotripsy    . Tonsillectomy and adenoidectomy    . Prostate biopsy      FAMILY HISTORY: family history includes Alzheimer's disease in his mother; Anxiety disorder in his mother and sister; Bone cancer in his father; Cancer in his father; Dementia in his mother; and Heart disease in his father.  There is no history of Colon cancer, and Esophageal cancer, and Rectal cancer, and Stomach cancer, .  SOCIAL HISTORY:  reports that he  has never smoked. He has never used smokeless tobacco. He reports that he does not drink alcohol or use illicit drugs.  ALLERGIES: Aleve  MEDICATIONS:  Current Outpatient Prescriptions  Medication Sig Dispense Refill  . clonazePAM (KLONOPIN) 1 MG tablet Take 1 tablet at 8 am, 1/2 tablet at 2 pm and 1 tablet at bedtime for anxiety.  75 tablet  0  . FLUoxetine (PROZAC) 20 MG capsule Take 20 mg by mouth 2 (two) times daily.       . hydrochlorothiazide (HYDRODIURIL) 12.5 MG tablet Take 1 tablet (12.5 mg total) by mouth daily. For blood pressure control.  30 tablet  0  . nortriptyline (PAMELOR) 50 MG capsule Take 50 mg by mouth at bedtime.      . fluticasone (FLONASE) 50 MCG/ACT nasal spray Place 2 sprays into the nose daily.  16 g  6  . gabapentin (NEURONTIN) 300 MG capsule Take 1 capsule (300 mg total) by mouth 3 (three) times daily at 8am, 2pm and bedtime. For anxiety and mood stabilization.  90 capsule  0   No current facility-administered medications for this encounter.    REVIEW OF SYSTEMS:  A 15 point review of systems is documented in the electronic medical record. This was obtained by the nursing staff. However, I reviewed this with the patient to discuss relevant findings and make appropriate changes.  A comprehensive review of systems was negative..  The patient completed an IPSS and IIEF questionnaire.  His IPSS score was 7 indicating mild urinary outflow obstructive symptoms.  He indicated that  his erectile function is almost always able to complete sexual activity, although he places erectile function as a very low priority.   PHYSICAL EXAM: This patient is in no acute distress.  He is alert and oriented.   height is 5\' 9"  (1.753 m) and weight is 194 lb 1.6 oz (88.043 kg). His oral temperature is 97.9 F (36.6 C). His blood pressure is 116/74 and his pulse is 89. His respiration is 18 and oxygen saturation is 100%.  He exhibits no respiratory distress or labored breathing.  He appears  neurologically intact.  His mood is pleasant.  His affect is appropriate.  Please note the digital rectal exam findings described above.  LABORATORY DATA:  Lab Results  Component Value Date   WBC 6.7 10/29/2011   HGB 17.5* 10/29/2011   HCT 48.3 10/29/2011   MCV 91.5 10/29/2011   PLT 236 10/29/2011   Lab Results  Component Value Date   NA 139 10/29/2011   K 3.7 10/29/2011   CL 98 10/29/2011   CO2 31 10/29/2011   Lab Results  Component Value Date   ALT 14 10/29/2011   AST 17 10/29/2011   ALKPHOS 97 10/29/2011   BILITOT 0.9 10/29/2011     RADIOGRAPHY: No results found.    IMPRESSION: This gentleman is a 62 y.o. gentleman with stage T1c adenocarcinoma of the prostate with a Gleason's score of 4+3 and a PSA of 3.25.  His T-Stage, Gleason's Score, and PSA put him into the [place] risk group.  Accordingly he is eligible for a variety of potential treatment options including prostatectomy, IMRT or external radiation with seed implant boost.  If he were to consider radiation therapy, there is some evidence to support androgen deprivation for 2 months prior to radiation and continue hormones for total of 4-6 months.  PLAN:Today I reviewed the findings and workup thus far.  We discussed the natural history of prostate cancer.  We reviewed the implications of T-stage, Gleason's Score, and PSA on decision-making and outcomes in prostate cancer.  We discussed radiation treatment in the management of prostate cancer with regard to the logistics and delivery of external beam radiation treatment as well as the logistics and delivery of prostate brachytherapy.  We compared and contrasted each of these approaches and also compared these against prostatectomy as a boost.  I filled out a patient counseling form for him with relevant treatment diagrams and a tentative calendar of dates and we retained a copy for our records.   The patient remains undecided between prostatectomy and radiotherapy with or without seed boost, plus  or minus androgen deprivation.  I will share my findings with Dr. Margarita Grizzle and look forward to hearing about the patient's decision.  I enjoyed meeting with him today, and will look forward to participating in the care of this very nice gentleman.   I spent 60 minutes face to face with the patient and more than 50% of that time was spent in counseling and/or coordination of care.   ------------------------------------------------  Artist Pais. Kathrynn Running, M.D.

## 2012-09-06 NOTE — Progress Notes (Signed)
See progress note under physician encounter. 

## 2012-09-06 NOTE — Progress Notes (Signed)
Reports on average he gets up twice during the night to void. Denies burning with urination. IPSS 7. Reports urine stream alternates between weak and strong. Denies difficulty emptying his bladder. Denies urgency. Denies incontinence. Reports occasionally since biopsy he will see "brown dried blood in his urine." Denies painful bowel movements. Denies seeing blood in his stool. Reports occasionally his pelvis and hips feel sore and aching. Denies pain. Denies difficulty sleeping. Report weight is stable. Denies nausea, vomiting, headache, dizziness, or diarrhea. Wife reports that her husband's energy level is low.

## 2012-09-06 NOTE — Progress Notes (Signed)
Complete PATIENT MEASURE OF DISTRESS worksheet with a score of 8 submitted to social work. Patient reports that he has been "battling anxiety and depression since before his cancer diagnosis." Patient request to be contacted by phone.

## 2012-09-10 ENCOUNTER — Encounter: Payer: Self-pay | Admitting: Radiation Oncology

## 2012-09-10 NOTE — Progress Notes (Signed)
  Radiation Oncology         [phone] ________________________________  Name: Eduardo Fuentes MRN: [id number]  Date: 09/10/2012  DOB: 1951-02-15  Telephone contact:  I received a message to call this patient and returned the phone call.  After meeting with me in consultation on 09/06/2012, the patient is leaning towards radiotherapy for the treatment of his prostate cancer. He had a few questions today about the rationale behind the timing of neoadjuvant hormonal therapy versus concurrent hormonal therapy.  I indicated that there is some clinical evidence that neoadjuvant hormonal therapy seems to improve biochemical outcomes over concurrent treatment suggesting some cancer downsizing and sensitization. (1, 2)  He also wondered about survival statistics associated with external radiation and brachytherapy in combination, and I reviewed some of the literature showing high disease specific survivals at 15 years post treatment. (3,4,5)  He is scheduled to return to Dr. Margarita Grizzle in early August to discuss treatment.  ________________________________  Artist Pais. Kathrynn Running, M.D.      1: D'Amico AV, Manola J, Loffredo M, Renshaw AA, DellaCroce A, Kantoff PW. 57-month androgen suppression plus radiation therapy vs radiation therapy alone for patients with clinically localized prostate cancer: a randomized controlled trial. JAMA. 2004 Aug 18;292(7):821-7. PubMed PMID: 25366440.  2: Estill Bamberg 3rd, DeSilvio Marjo Bicker, Seider MJ, Luanna Salk, Asbell SO, Valicenti RK, Leana Gamer; Radiation Therapy Oncology Group [phone]. Phase III trial comparing whole-pelvic versus prostate-only radiotherapy and neoadjuvant versus adjuvant combined androgen suppression: Radiation Therapy Oncology Group 9413. J Clin Oncol. 2003 May 15;21(10):1904-11. PubMed PMID: 25956387.  3: Stone NN, Stock RG. 15-Year Cause Specific and All-Cause Survival Following Brachytherapy for  Prostate Cancer: Negative Impact of Long-Term Hormonal Therapy. J Urol. 2014 Mar 31. pii: [id number])03182-6. doi: 10.1016/j.juro.2014.03.094. [Epub ahead of print] PubMed PMID: 56433295.  4: Larina Bras NN, Solon Palm, [place], Zelefsky MJ, Donavan Burnet, [place], Kattan MW, Stock [place]. Multicenter analysis of effect of high biologic effective dose on biochemical failure and survival outcomes in patients with Gleason score 7-10 prostate cancer treated with permanent prostate brachytherapy. Int Mickel Baas Oncol Biol Phys. 2009 Feb 1;73(2):341-6. doi: 10.1016/j.ijrobp.2008.04.038. Epub 2008 Jul 1. PubMed PMID: 18841660.  5: Heber Atmore, Sunday Spillers, Dicker AP, Cardell Peach, Immerzeel Roetta Sessions M, Kupelian P, Lee WR, [place], Mayadev J, Moran BJ, [address] M, Stock R, Shinohara K, Scholz M, Weber E, Zietman A, Zelefsky M, Wong J, Wentworth S, Vera R, Langley S. Comparative analysis of prostate-specific antigen free survival outcomes for patients with low, intermediate and high risk prostate cancer treatment by radical therapy. Results from the Prostate Cancer Results Study Group. BJU Int. 2012 Feb;109 Suppl 1:22-9. doi: 10.1111/j.1464-410X.2011.10827.x. Review. PubMed PMID: 63016010. In

## 2012-09-30 ENCOUNTER — Telehealth: Payer: Self-pay | Admitting: *Deleted

## 2012-09-30 NOTE — Telephone Encounter (Signed)
Called patient to inform of his gold seed placement for 11-08-12 at 9:15 am at the urologist's office and his sim on 11-12-12 at 11:00 am at Dr. Broadus John Office, spoke with patient and he is aware of these appts.

## 2012-10-04 NOTE — Telephone Encounter (Signed)
xxxx 

## 2012-11-11 ENCOUNTER — Other Ambulatory Visit: Payer: BC Managed Care – PPO | Admitting: Radiation Oncology

## 2012-11-12 ENCOUNTER — Ambulatory Visit: Payer: BC Managed Care – PPO | Admitting: Radiation Oncology

## 2012-11-19 ENCOUNTER — Telehealth: Payer: Self-pay | Admitting: *Deleted

## 2012-11-19 NOTE — Telephone Encounter (Signed)
Called patient to alter sim appt. Due to Dr. Kathrynn Running being in the OR, moved appt. To 11 am on 11-25-12, lvm for a return call

## 2012-11-24 ENCOUNTER — Telehealth: Payer: Self-pay | Admitting: *Deleted

## 2012-11-24 NOTE — Telephone Encounter (Signed)
CALLED PATIENT TO REMIND OF SIM APPT. FOR 11-25-12 AT 11 AM, SPOKE WITH PATIENT AND HE IS AWARE OF THIS APPT.

## 2012-11-25 ENCOUNTER — Ambulatory Visit
Admission: RE | Admit: 2012-11-25 | Discharge: 2012-11-25 | Disposition: A | Discharge status: Home / Self Care | Payer: BC Managed Care – PPO | Source: Ambulatory Visit | Attending: Radiation Oncology | Admitting: Radiation Oncology

## 2012-11-25 DIAGNOSIS — C61 Malignant neoplasm of prostate: Secondary | ICD-10-CM

## 2012-11-25 DIAGNOSIS — Z510 Encounter for antineoplastic radiation therapy: Secondary | ICD-10-CM | POA: Insufficient documentation

## 2012-11-25 NOTE — Progress Notes (Signed)
  Radiation Oncology         [phone] ________________________________  Name: Eduardo Fuentes MRN: [id number]  Date: 11/25/2012  DOB: 05-31-1950  SIMULATION AND TREATMENT PLANNING NOTE  DIAGNOSIS:  62 y.o. gentleman with stage T1c adenocarcinoma of the prostate with a Gleason's score of 4+3 and a PSA of 3.25  NARRATIVE:  The patient was brought to the CT Simulation planning suite.  Identity was confirmed.  All relevant records and images related to the planned course of therapy were reviewed.  The patient freely provided informed written consent to proceed with treatment after reviewing the details related to the planned course of therapy. The consent form was witnessed and verified by the simulation staff.  Then, the patient was set-up in a stable reproducible supine position for radiation therapy.  A vacuum lock pillow device was custom fabricated to position his legs in a reproducible immobilized position.  Then, I performed a urethrogram under sterile conditions to identify the prostatic apex.  CT images were obtained.  Surface markings were placed.  The CT images were loaded into the planning software.  Then the prostate target and avoidance structures including the rectum, bladder, bowel and hips were contoured.  Treatment planning then occurred.  The radiation prescription was entered and confirmed.  A total of 5 complex treatment devices were fabricated including the leg positioner and the 4 complex MLC apertures to shape radiation around the prostate while shielding the sensitive rectum and bladder. I have requested : 3D Simulation  I have requested a DVH of the following structures: bladder, rectum, and hips in addition to the target.  Image guidance is needed to maintain a small PTV margin related to bladder and rectal filling.  External radiation is likely to be followed by seed implant boost, so, the patient's anatomy was checked.  Using the 3D planning software, I reconstructed the  prostate in view of the structures from the transperineal needle pathway to assess for possible pubic arch interference. In doing so, I did not appreciate any pubic arch interference. Also, the patient's prostate volume was estimated based on the drawn structure. The volume was 29 cc correlating nicely with his ultrasound volume.  Given the pubic arch appearance and prostate volume, patient remains a good candidate to proceed with prostate seed implant boost    PLAN:  The patient will receive 45 Gy in 25 fractions followed by I-125 seed implant of an additional 110 Gy for total nominal dose of 155 Gy.  ________________________________  Artist Pais Kathrynn Running, M.D.

## 2012-12-06 ENCOUNTER — Ambulatory Visit
Admission: RE | Admit: 2012-12-06 | Discharge: 2012-12-06 | Disposition: A | Discharge status: Home / Self Care | Payer: BC Managed Care – PPO | Source: Ambulatory Visit | Attending: Radiation Oncology | Admitting: Radiation Oncology

## 2012-12-07 ENCOUNTER — Ambulatory Visit
Admission: RE | Admit: 2012-12-07 | Discharge: 2012-12-07 | Disposition: A | Discharge status: Home / Self Care | Payer: BC Managed Care – PPO | Source: Ambulatory Visit | Attending: Radiation Oncology | Admitting: Radiation Oncology

## 2012-12-08 ENCOUNTER — Ambulatory Visit
Admission: RE | Admit: 2012-12-08 | Discharge: 2012-12-08 | Disposition: A | Discharge status: Home / Self Care | Payer: BC Managed Care – PPO | Source: Ambulatory Visit | Attending: Radiation Oncology | Admitting: Radiation Oncology

## 2012-12-09 ENCOUNTER — Ambulatory Visit
Admission: RE | Admit: 2012-12-09 | Discharge: 2012-12-09 | Disposition: A | Discharge status: Home / Self Care | Payer: BC Managed Care – PPO | Source: Ambulatory Visit | Attending: Radiation Oncology | Admitting: Radiation Oncology

## 2012-12-10 ENCOUNTER — Ambulatory Visit
Admission: RE | Admit: 2012-12-10 | Discharge: 2012-12-10 | Disposition: A | Discharge status: Home / Self Care | Payer: BC Managed Care – PPO | Source: Ambulatory Visit | Attending: Radiation Oncology | Admitting: Radiation Oncology

## 2012-12-10 ENCOUNTER — Encounter: Payer: Self-pay | Admitting: Radiation Oncology

## 2012-12-10 VITALS — BP 133/96 | HR 70 | Resp 16 | Wt 194.1 lb

## 2012-12-10 DIAGNOSIS — C61 Malignant neoplasm of prostate: Secondary | ICD-10-CM

## 2012-12-10 NOTE — Progress Notes (Signed)
   Department of Radiation Oncology  Phone:  [phone] Fax:        [phone]  Weekly Treatment Note    Name: Eduardo Fuentes Date: 12/10/2012 MRN: [id number] DOB: Oct 01, 1950   Current dose: 9 Gy  Current fraction:5   MEDICATIONS: Current Outpatient Prescriptions  Medication Sig Dispense Refill  . clonazePAM (KLONOPIN) 1 MG tablet Take 1 tablet at 8 am, 1/2 tablet at 2 pm and 1 tablet at bedtime for anxiety.  75 tablet  0  . FLUoxetine (PROZAC) 20 MG capsule Take 20 mg by mouth 2 (two) times daily.       . fluticasone (FLONASE) 50 MCG/ACT nasal spray Place 2 sprays into the nose daily.  16 g  6  . hydrochlorothiazide (HYDRODIURIL) 12.5 MG tablet Take 1 tablet (12.5 mg total) by mouth daily. For blood pressure control.  30 tablet  0  . nortriptyline (PAMELOR) 50 MG capsule Take 50 mg by mouth at bedtime.      . gabapentin (NEURONTIN) 300 MG capsule Take 1 capsule (300 mg total) by mouth 3 (three) times daily at 8am, 2pm and bedtime. For anxiety and mood stabilization.  90 capsule  0   No current facility-administered medications for this encounter.     ALLERGIES: Aleve   LABORATORY DATA:  Lab Results  Component Value Date   WBC 6.7 10/29/2011   HGB 17.5* 10/29/2011   HCT 48.3 10/29/2011   MCV 91.5 10/29/2011   PLT 236 10/29/2011   Lab Results  Component Value Date   NA 139 10/29/2011   K 3.7 10/29/2011   CL 98 10/29/2011   CO2 31 10/29/2011   Lab Results  Component Value Date   ALT 14 10/29/2011   AST 17 10/29/2011   ALKPHOS 97 10/29/2011   BILITOT 0.9 10/29/2011     NARRATIVE: [name] was seen today for weekly treatment management. The chart was checked and the patient's films were reviewed. The patient is doing well, has completed 1 week of treatment. Some fatigue with the hormonal treatment.  PHYSICAL EXAMINATION: weight is 194 lb 1.6 oz (88.043 kg). His blood pressure is 133/96 and his pulse is 70. His respiration is 16.        ASSESSMENT: The patient is doing  satisfactorily with treatment.  PLAN: We will continue with the patient's radiation treatment as planned.

## 2012-12-10 NOTE — Progress Notes (Signed)
Patient reports fatigue related to effects of hormone injection last week. Denies hematuria or dysuria. Reports on average he gets up twice during the night to void. Reports normal strong urine stream. Denies incontinence. Reports he is able to completely empty his bladder.    Oriented patient to staff and routine of the clinic. Provided patient with RADIATION THERAPY AND YOU handbook. Then, reviewed pertinent information. Educated patient on potential side effects of radiation therapy and management such as, bowel and bladder changes. All questions answered. Patient verbalized understanding of all reviewed.

## 2012-12-13 ENCOUNTER — Ambulatory Visit
Admission: RE | Admit: 2012-12-13 | Discharge: 2012-12-13 | Disposition: A | Discharge status: Home / Self Care | Payer: BC Managed Care – PPO | Source: Ambulatory Visit | Attending: Radiation Oncology | Admitting: Radiation Oncology

## 2012-12-14 ENCOUNTER — Other Ambulatory Visit: Payer: Self-pay | Admitting: Urology

## 2012-12-14 ENCOUNTER — Ambulatory Visit
Admission: RE | Admit: 2012-12-14 | Discharge: 2012-12-14 | Disposition: A | Discharge status: Home / Self Care | Payer: BC Managed Care – PPO | Source: Ambulatory Visit | Attending: Radiation Oncology | Admitting: Radiation Oncology

## 2012-12-15 ENCOUNTER — Ambulatory Visit
Admission: RE | Admit: 2012-12-15 | Discharge: 2012-12-15 | Disposition: A | Discharge status: Home / Self Care | Payer: BC Managed Care – PPO | Source: Ambulatory Visit | Attending: Radiation Oncology | Admitting: Radiation Oncology

## 2012-12-15 ENCOUNTER — Encounter: Payer: Self-pay | Admitting: Radiation Oncology

## 2012-12-15 VITALS — BP 125/88 | HR 80 | Resp 16 | Wt 195.2 lb

## 2012-12-15 DIAGNOSIS — C61 Malignant neoplasm of prostate: Secondary | ICD-10-CM

## 2012-12-15 NOTE — Progress Notes (Signed)
  Radiation Oncology         [phone] ________________________________  Name: Eduardo Fuentes MRN: [id number]  Date: 12/15/2012  DOB: May 20, 1950  Weekly Radiation Therapy Management  Current Dose: 14.4 Gy     Planned Dose:  75 Gy  Narrative . . . . . . . . The patient presents for routine under treatment assessment.                                                      The patient is without complaint.                                 Set-up films were reviewed.                                 The chart was checked. Physical Findings. . .  weight is 195 lb 3.2 oz (88.542 kg). His blood pressure is 125/88 and his pulse is 80. His respiration is 16. . Weight essentially stable.  No significant changes. Impression . . . . . . . The patient is  tolerating radiation. Plan . . . . . . . . . . . . Continue treatment as planned.  ________________________________  Artist Pais. Kathrynn Running, M.D.

## 2012-12-15 NOTE — Progress Notes (Signed)
Denies urgency, frequency or incontinence. Reports on average he voids three times per night. Reports yesterday he had four or five soft bowel movements. Denies dysuria or hematuria. Denies pain associated with bowel movements. Denies fatigue. Reports a strong steady normal urine stream.

## 2012-12-16 ENCOUNTER — Ambulatory Visit
Admission: RE | Admit: 2012-12-16 | Discharge: 2012-12-16 | Disposition: A | Discharge status: Home / Self Care | Payer: BC Managed Care – PPO | Source: Ambulatory Visit | Attending: Radiation Oncology | Admitting: Radiation Oncology

## 2012-12-17 ENCOUNTER — Ambulatory Visit
Admission: RE | Admit: 2012-12-17 | Discharge: 2012-12-17 | Disposition: A | Discharge status: Home / Self Care | Payer: BC Managed Care – PPO | Source: Ambulatory Visit | Attending: Radiation Oncology | Admitting: Radiation Oncology

## 2012-12-20 ENCOUNTER — Ambulatory Visit
Admission: RE | Admit: 2012-12-20 | Discharge: 2012-12-20 | Disposition: A | Discharge status: Home / Self Care | Payer: BC Managed Care – PPO | Source: Ambulatory Visit | Attending: Radiation Oncology | Admitting: Radiation Oncology

## 2012-12-21 ENCOUNTER — Ambulatory Visit
Admission: RE | Admit: 2012-12-21 | Discharge: 2012-12-21 | Disposition: A | Discharge status: Home / Self Care | Payer: BC Managed Care – PPO | Source: Ambulatory Visit | Attending: Radiation Oncology | Admitting: Radiation Oncology

## 2012-12-22 ENCOUNTER — Ambulatory Visit
Admission: RE | Admit: 2012-12-22 | Discharge: 2012-12-22 | Disposition: A | Discharge status: Home / Self Care | Payer: BC Managed Care – PPO | Source: Ambulatory Visit | Attending: Radiation Oncology | Admitting: Radiation Oncology

## 2012-12-23 ENCOUNTER — Ambulatory Visit
Admission: RE | Admit: 2012-12-23 | Discharge: 2012-12-23 | Disposition: A | Discharge status: Home / Self Care | Payer: BC Managed Care – PPO | Source: Ambulatory Visit | Attending: Radiation Oncology | Admitting: Radiation Oncology

## 2012-12-24 ENCOUNTER — Ambulatory Visit
Admission: RE | Admit: 2012-12-24 | Discharge: 2012-12-24 | Disposition: A | Discharge status: Home / Self Care | Payer: BC Managed Care – PPO | Source: Ambulatory Visit | Attending: Radiation Oncology | Admitting: Radiation Oncology

## 2012-12-24 ENCOUNTER — Encounter: Payer: Self-pay | Admitting: Radiation Oncology

## 2012-12-24 VITALS — BP 116/88 | HR 89 | Resp 16 | Wt 192.9 lb

## 2012-12-24 DIAGNOSIS — C61 Malignant neoplasm of prostate: Secondary | ICD-10-CM

## 2012-12-24 NOTE — Progress Notes (Signed)
   Department of Radiation Oncology  Phone:  [phone] Fax:        [phone]  Weekly Treatment Note    Name: Eduardo Fuentes Date: 12/24/2012 MRN: [id number] DOB: 1950/09/26   Current dose: 27 Gy  Current fraction: 15   MEDICATIONS: Current Outpatient Prescriptions  Medication Sig Dispense Refill  . clonazePAM (KLONOPIN) 1 MG tablet Take 1 tablet at 8 am, 1/2 tablet at 2 pm and 1 tablet at bedtime for anxiety.  75 tablet  0  . FLUoxetine (PROZAC) 20 MG capsule Take 20 mg by mouth 2 (two) times daily.       . fluticasone (FLONASE) 50 MCG/ACT nasal spray Place 2 sprays into the nose daily.  16 g  6  . hydrochlorothiazide (HYDRODIURIL) 12.5 MG tablet Take 1 tablet (12.5 mg total) by mouth daily. For blood pressure control.  30 tablet  0  . nortriptyline (PAMELOR) 50 MG capsule Take 50 mg by mouth at bedtime.      . gabapentin (NEURONTIN) 300 MG capsule Take 1 capsule (300 mg total) by mouth 3 (three) times daily at 8am, 2pm and bedtime. For anxiety and mood stabilization.  90 capsule  0   No current facility-administered medications for this encounter.     ALLERGIES: Aleve   LABORATORY DATA:  Lab Results  Component Value Date   WBC 6.7 10/29/2011   HGB 17.5* 10/29/2011   HCT 48.3 10/29/2011   MCV 91.5 10/29/2011   PLT 236 10/29/2011   Lab Results  Component Value Date   NA 139 10/29/2011   K 3.7 10/29/2011   CL 98 10/29/2011   CO2 31 10/29/2011   Lab Results  Component Value Date   ALT 14 10/29/2011   AST 17 10/29/2011   ALKPHOS 97 10/29/2011   BILITOT 0.9 10/29/2011     NARRATIVE: Eduardo Fuentes was seen today for weekly treatment management. The chart was checked and the patient's films were reviewed. The patient states that he is doing relatively well this week. He denies any change in urgency or frequency. Nocturia 3 times per night. No hematuria and no blood per rectum.  PHYSICAL EXAMINATION: weight is 192 lb 14.4 oz (87.499 kg). His blood pressure is 116/88 and his pulse  is 89. His respiration is 16.        ASSESSMENT: The patient is doing satisfactorily with treatment.  PLAN: We will continue with the patient's radiation treatment as planned.

## 2012-12-24 NOTE — Progress Notes (Signed)
Denies urgency, frequency or incontinence. Reports on average he voids three times per night. Reports this morning he was constipated. Encouraged him to increase his water intake and drink 4-6 glasses of apple juice. Denies dysuria or hematuria. Denies pain associated with bowel movements. Denies fatigue. Reports a strong steady normal urine stream.

## 2012-12-27 ENCOUNTER — Encounter: Payer: Self-pay | Admitting: Podiatry

## 2012-12-27 ENCOUNTER — Ambulatory Visit
Admission: RE | Admit: 2012-12-27 | Discharge: 2012-12-27 | Disposition: A | Discharge status: Home / Self Care | Payer: BC Managed Care – PPO | Source: Ambulatory Visit | Attending: Radiation Oncology | Admitting: Radiation Oncology

## 2012-12-27 ENCOUNTER — Ambulatory Visit (INDEPENDENT_AMBULATORY_CARE_PROVIDER_SITE_OTHER): Payer: BC Managed Care – PPO | Admitting: Podiatry

## 2012-12-27 VITALS — BP 159/61 | HR 96 | Resp 12 | Ht 69.0 in | Wt 190.0 lb

## 2012-12-27 DIAGNOSIS — M216X9 Other acquired deformities of unspecified foot: Secondary | ICD-10-CM

## 2012-12-27 DIAGNOSIS — Q828 Other specified congenital malformations of skin: Secondary | ICD-10-CM

## 2012-12-27 NOTE — Progress Notes (Signed)
  Subjective:    Patient ID: Eduardo Fuentes, male    DOB: Jun 22, 1950, 62 y.o.   MRN: [id number]  HPI    Review of Systems  Constitutional: Positive for fatigue.  HENT: Negative.   Eyes: Negative.   Respiratory: Negative.   Cardiovascular: Negative.   Gastrointestinal: Negative.   Endocrine: Negative.   Genitourinary: Positive for frequency.  Musculoskeletal: Negative.   Allergic/Immunologic: Negative.   Neurological: Negative.   Hematological: Negative.   Psychiatric/Behavioral: Negative.        Objective:   Physical Exam        Assessment & Plan:

## 2012-12-27 NOTE — Progress Notes (Signed)
N-SORE L-RT FOOT D-3 MONTHS O-SLOWLY C-WORSE A-PRESSURE T-N/A

## 2012-12-28 ENCOUNTER — Ambulatory Visit
Admission: RE | Admit: 2012-12-28 | Discharge: 2012-12-28 | Disposition: A | Discharge status: Home / Self Care | Payer: BC Managed Care – PPO | Source: Ambulatory Visit | Attending: Radiation Oncology | Admitting: Radiation Oncology

## 2012-12-28 NOTE — Progress Notes (Signed)
Subjective:     Patient ID: Eduardo Fuentes, male   DOB: 1950/11/13, 62 y.o.   MRN: [id number]  HPI patient states I have a lesion that is painful under my right foot. States it's been present for several months and is becoming increasingly tender to walk on. Does not remember specific injury   Review of Systems  All other systems reviewed and are negative.       Objective:   Physical Exam  Nursing note and vitals reviewed. Constitutional: He is oriented to person, place, and time.  Cardiovascular: Intact distal pulses.   Musculoskeletal: Normal range of motion.  Neurological: He is oriented to person, place, and time.  Skin: Skin is warm.   patient is found to have keratotic lesions sub-3 right that is painful when pressed with a waxy type core     Assessment:     Probable porokeratotic type lesion with the possibility of verruca plantar aspect right foot    Plan:     H&P and education 2 patient given. Went ahead today and debrided the lesion fully and applied salicylic acid prepped with padding a cover. Reappoint when symptomatic again

## 2012-12-29 ENCOUNTER — Ambulatory Visit
Admission: RE | Admit: 2012-12-29 | Discharge: 2012-12-29 | Disposition: A | Discharge status: Home / Self Care | Payer: BC Managed Care – PPO | Source: Ambulatory Visit | Attending: Radiation Oncology | Admitting: Radiation Oncology

## 2012-12-29 ENCOUNTER — Encounter: Payer: Self-pay | Admitting: Radiation Oncology

## 2012-12-29 VITALS — BP 116/87 | HR 78 | Resp 16 | Wt 193.9 lb

## 2012-12-29 DIAGNOSIS — C61 Malignant neoplasm of prostate: Secondary | ICD-10-CM

## 2012-12-29 NOTE — Progress Notes (Signed)
  Radiation Oncology         (336) [phone] ________________________________  Name: Eduardo Fuentes MRN: [id number]  Date: 12/29/2012  DOB: 1950-06-02  Weekly Radiation Therapy Management  Current Dose: 32.4 Gy     Planned Dose:  45 Gy plus seed implant  Narrative . . . . . . . . The patient presents for routine under treatment assessment.                                   The patient is without complaint.                                 Set-up films were reviewed.                                 The chart was checked. Physical Findings. . .  weight is 193 lb 14.4 oz (87.952 kg). Eduardo Fuentes blood pressure is 116/87 and Eduardo Fuentes pulse is 78. Eduardo Fuentes respiration is 16. . Weight essentially stable.  No significant changes. Impression . . . . . . . The patient is tolerating radiation. Plan . . . . . . . . . . . . Continue treatment as planned.  ________________________________  Eduardo Fuentes, M.D.

## 2012-12-29 NOTE — Progress Notes (Signed)
Denies urgency, frequency or incontinence. Reports on average he voids three times per night. Denies dysuria or hematuria. Denies pain associated with bowel movements. Denies fatigue. Reports a strong steady normal urine stream. Darryl Nestle spoke with patient reference upcoming seed implant. Patient plans to have chest xray tomorrow following treatment.

## 2012-12-30 ENCOUNTER — Ambulatory Visit
Admission: RE | Admit: 2012-12-30 | Discharge: 2012-12-30 | Disposition: A | Discharge status: Home / Self Care | Payer: BC Managed Care – PPO | Source: Ambulatory Visit | Attending: Radiation Oncology | Admitting: Radiation Oncology

## 2012-12-31 ENCOUNTER — Ambulatory Visit (HOSPITAL_BASED_OUTPATIENT_CLINIC_OR_DEPARTMENT_OTHER)
Admission: RE | Admit: 2012-12-31 | Discharge: 2012-12-31 | Disposition: A | Discharge status: Home / Self Care | Payer: BC Managed Care – PPO | Source: Ambulatory Visit | Attending: Urology | Admitting: Urology

## 2012-12-31 ENCOUNTER — Ambulatory Visit: Payer: BC Managed Care – PPO

## 2012-12-31 ENCOUNTER — Other Ambulatory Visit: Payer: Self-pay

## 2012-12-31 ENCOUNTER — Other Ambulatory Visit (HOSPITAL_COMMUNITY): Payer: Self-pay | Admitting: Radiology

## 2012-12-31 ENCOUNTER — Encounter (HOSPITAL_BASED_OUTPATIENT_CLINIC_OR_DEPARTMENT_OTHER)
Admission: RE | Admit: 2012-12-31 | Discharge: 2012-12-31 | Disposition: A | Discharge status: Home / Self Care | Payer: BC Managed Care – PPO | Source: Ambulatory Visit | Attending: Urology | Admitting: Urology

## 2012-12-31 DIAGNOSIS — Z01818 Encounter for other preprocedural examination: Secondary | ICD-10-CM | POA: Insufficient documentation

## 2012-12-31 DIAGNOSIS — Z01810 Encounter for preprocedural cardiovascular examination: Secondary | ICD-10-CM | POA: Insufficient documentation

## 2012-12-31 DIAGNOSIS — C61 Malignant neoplasm of prostate: Secondary | ICD-10-CM | POA: Insufficient documentation

## 2013-01-03 ENCOUNTER — Ambulatory Visit
Admission: RE | Admit: 2013-01-03 | Discharge: 2013-01-03 | Disposition: A | Discharge status: Home / Self Care | Payer: BC Managed Care – PPO | Source: Ambulatory Visit | Attending: Radiation Oncology | Admitting: Radiation Oncology

## 2013-01-04 ENCOUNTER — Ambulatory Visit
Admission: RE | Admit: 2013-01-04 | Discharge: 2013-01-04 | Disposition: A | Discharge status: Home / Self Care | Payer: BC Managed Care – PPO | Source: Ambulatory Visit | Attending: Radiation Oncology | Admitting: Radiation Oncology

## 2013-01-05 ENCOUNTER — Ambulatory Visit
Admission: RE | Admit: 2013-01-05 | Discharge: 2013-01-05 | Disposition: A | Discharge status: Home / Self Care | Payer: BC Managed Care – PPO | Source: Ambulatory Visit | Attending: Radiation Oncology | Admitting: Radiation Oncology

## 2013-01-06 ENCOUNTER — Ambulatory Visit
Admission: RE | Admit: 2013-01-06 | Discharge: 2013-01-06 | Disposition: A | Discharge status: Home / Self Care | Payer: BC Managed Care – PPO | Source: Ambulatory Visit | Attending: Radiation Oncology | Admitting: Radiation Oncology

## 2013-01-07 ENCOUNTER — Ambulatory Visit: Payer: BC Managed Care – PPO

## 2013-01-07 ENCOUNTER — Ambulatory Visit
Admission: RE | Admit: 2013-01-07 | Discharge: 2013-01-07 | Disposition: A | Discharge status: Home / Self Care | Payer: BC Managed Care – PPO | Source: Ambulatory Visit | Attending: Radiation Oncology | Admitting: Radiation Oncology

## 2013-01-07 ENCOUNTER — Encounter: Payer: Self-pay | Admitting: Radiation Oncology

## 2013-01-07 VITALS — BP 120/88 | HR 87 | Resp 16 | Wt 194.9 lb

## 2013-01-07 DIAGNOSIS — C61 Malignant neoplasm of prostate: Secondary | ICD-10-CM

## 2013-01-07 NOTE — Progress Notes (Signed)
Reports on average he gets up four times per night to void. Denies dysuria or hematuria. Denies incontinence. Denies diarrhea. Reports mild fatigue but, remains active.

## 2013-01-07 NOTE — Progress Notes (Signed)
   Department of Radiation Oncology  Phone:  [phone] Fax:        [phone]  Weekly Treatment Note    Name: Eduardo Fuentes Date: 01/07/2013 MRN: [id number] DOB: 05-Aug-1950   Current dose: 43.2 Gy  Current fraction:24   MEDICATIONS: Current Outpatient Prescriptions  Medication Sig Dispense Refill  . clonazePAM (KLONOPIN) 1 MG tablet Take 1 tablet at 8 am, 1/2 tablet at 2 pm and 1 tablet at bedtime for anxiety.  75 tablet  0  . FLUoxetine (PROZAC) 20 MG capsule Take 20 mg by mouth 2 (two) times daily.       . hydrochlorothiazide (HYDRODIURIL) 12.5 MG tablet Take 1 tablet (12.5 mg total) by mouth daily. For blood pressure control.  30 tablet  0  . nortriptyline (PAMELOR) 50 MG capsule Take 50 mg by mouth at bedtime.      . gabapentin (NEURONTIN) 300 MG capsule Take 1 capsule (300 mg total) by mouth 3 (three) times daily at 8am, 2pm and bedtime. For anxiety and mood stabilization.  90 capsule  0   No current facility-administered medications for this encounter.     ALLERGIES: Aleve   LABORATORY DATA:  Lab Results  Component Value Date   WBC 6.7 10/29/2011   HGB 17.5* 10/29/2011   HCT 48.3 10/29/2011   MCV 91.5 10/29/2011   PLT 236 10/29/2011   Lab Results  Component Value Date   NA 139 10/29/2011   K 3.7 10/29/2011   CL 98 10/29/2011   CO2 31 10/29/2011   Lab Results  Component Value Date   ALT 14 10/29/2011   AST 17 10/29/2011   ALKPHOS 97 10/29/2011   BILITOT 0.9 10/29/2011     NARRATIVE: Eduardo Fuentes was seen today for weekly treatment management. The chart was checked and the patient's films were reviewed. The patient is doing very well. No major changes. Nocurira stable ~ 3x per night.  PHYSICAL EXAMINATION: weight is 194 lb 14.4 oz (88.406 kg). His blood pressure is 120/88 and his pulse is 87. His respiration is 16.        ASSESSMENT: The patient is doing satisfactorily with treatment.  PLAN: We will continue with the patient's radiation treatment as planned.

## 2013-01-10 ENCOUNTER — Ambulatory Visit: Payer: BC Managed Care – PPO

## 2013-01-10 ENCOUNTER — Ambulatory Visit
Admission: RE | Admit: 2013-01-10 | Discharge: 2013-01-10 | Disposition: A | Discharge status: Home / Self Care | Payer: BC Managed Care – PPO | Source: Ambulatory Visit | Attending: Radiation Oncology | Admitting: Radiation Oncology

## 2013-01-10 ENCOUNTER — Encounter: Payer: Self-pay | Admitting: Radiation Oncology

## 2013-01-11 ENCOUNTER — Ambulatory Visit: Payer: BC Managed Care – PPO

## 2013-01-11 NOTE — Progress Notes (Signed)
  Radiation Oncology         (336) [phone] ________________________________  Name: Eduardo Fuentes MRN: [id number]  Date: 01/10/2013  DOB: 09/29/1950  End of Treatment Note  Diagnosis:   62 y.o. gentleman with stage T1c adenocarcinoma of the prostate with a Gleason's score of 4+3 and a PSA of 3.25  Indication for treatment:  Curative, External radiation to be followed by seed implant boost   Radiation treatment dates:  12/06/2012-01/10/2013  Site/dose:   The prostate received 45 Gy in 25 fractions of 1.8 Gy with a planned I-125 seed implant of an additional 110 Gy for total nominal dose of 155 Gy.  Beams/energy:   A customized 4-field beam arrangement was employed using anterior, posterior, right, and left beams with 4 complex MLC apertures to shape radiation around the prostate while shielding the sensitive rectum and bladder.  Cone beam CT imaging was used to maintain a small PTV margin related to bladder and rectal filling.  Narrative: The patient tolerated radiation treatment relatively well.   Reports on average he gets up four times per night to void. Denies dysuria or hematuria. Denies incontinence. Denies diarrhea. Reports mild fatigue but, remains active  Plan: The patient has completed radiation treatment. The patient will proceed to prostate seed implant on 12/10. I advised him to call or return sooner if he has any questions or concerns related to his recovery or treatment. ________________________________  Artist Pais. Kathrynn Running, M.D.

## 2013-01-12 ENCOUNTER — Ambulatory Visit: Payer: BC Managed Care – PPO

## 2013-01-13 ENCOUNTER — Ambulatory Visit: Payer: BC Managed Care – PPO

## 2013-01-14 ENCOUNTER — Ambulatory Visit: Payer: BC Managed Care – PPO

## 2013-01-17 ENCOUNTER — Ambulatory Visit: Payer: BC Managed Care – PPO

## 2013-01-18 ENCOUNTER — Ambulatory Visit: Payer: BC Managed Care – PPO

## 2013-01-19 ENCOUNTER — Ambulatory Visit: Payer: BC Managed Care – PPO

## 2013-01-24 ENCOUNTER — Ambulatory Visit: Payer: BC Managed Care – PPO

## 2013-01-25 ENCOUNTER — Telehealth: Payer: Self-pay | Admitting: *Deleted

## 2013-01-25 ENCOUNTER — Ambulatory Visit: Payer: BC Managed Care – PPO

## 2013-01-25 NOTE — Telephone Encounter (Signed)
CALLED PATIENT TO  REMIND OF APPT. FOR 01-26-13, SPOKE WITH PATIENT AND HE IS AWARE OF THIS APPT.

## 2013-01-26 ENCOUNTER — Ambulatory Visit: Payer: BC Managed Care – PPO

## 2013-01-26 LAB — CBC WITH DIFFERENTIAL/PLATELET
Eosinophils Absolute: 0.1 10*3/uL (ref 0.0–0.7)
Hemoglobin: 14.7 g/dL (ref 13.0–17.0)
Lymphocytes Relative: 20 % (ref 12–46)
Lymphs Abs: 0.9 10*3/uL (ref 0.7–4.0)
MCH: 32 pg (ref 26.0–34.0)
Monocytes Relative: 5 % (ref 3–12)
Neutro Abs: 3.2 10*3/uL (ref 1.7–7.7)
Neutrophils Relative %: 73 % (ref 43–77)
RBC: 4.59 MIL/uL (ref 4.22–5.81)
WBC: 4.4 10*3/uL (ref 4.0–10.5)

## 2013-01-26 LAB — COMPREHENSIVE METABOLIC PANEL
ALT: 40 U/L (ref 0–53)
Alkaline Phosphatase: 130 U/L — ABNORMAL HIGH (ref 39–117)
BUN: 17 mg/dL (ref 6–23)
CO2: 27 mEq/L (ref 19–32)
Chloride: 99 mEq/L (ref 96–112)
GFR calc Af Amer: 82 mL/min — ABNORMAL LOW (ref >90)
GFR calc non Af Amer: 70 mL/min — ABNORMAL LOW (ref >90)
Glucose, Bld: 126 mg/dL — ABNORMAL HIGH (ref 70–99)
Potassium: 3.2 mEq/L — ABNORMAL LOW (ref 3.5–5.1)
Total Bilirubin: 0.4 mg/dL (ref 0.3–1.2)

## 2013-01-26 LAB — APTT: aPTT: 27 seconds (ref 24–37)

## 2013-01-27 ENCOUNTER — Ambulatory Visit: Payer: BC Managed Care – PPO

## 2013-01-28 ENCOUNTER — Ambulatory Visit: Payer: BC Managed Care – PPO

## 2013-01-31 ENCOUNTER — Encounter (HOSPITAL_BASED_OUTPATIENT_CLINIC_OR_DEPARTMENT_OTHER): Payer: Self-pay | Admitting: *Deleted

## 2013-01-31 ENCOUNTER — Ambulatory Visit: Payer: BC Managed Care – PPO

## 2013-01-31 NOTE — Progress Notes (Signed)
01/31/13 1634  OBSTRUCTIVE SLEEP APNEA  Have you ever been diagnosed with sleep apnea through a sleep study? No  Do you snore loudly (loud enough to be heard through closed doors)?  1  Do you often feel tired, fatigued, or sleepy during the daytime? 0  Has anyone observed you stop breathing during your sleep? 0  Do you have, or are you being treated for high blood pressure? 1  BMI more than 35 kg/m2? 0  Age over 63 years old? 1  Neck circumference greater than 40 cm/18 inches? 0  Gender: 1  Obstructive Sleep Apnea Score 4  Score 4 or greater  Results sent to PCP

## 2013-01-31 NOTE — Progress Notes (Addendum)
NPO AFTER MN WITH EXCEPTION CLEAR LIQUIDS UNTIL 0800 (NO CREAM/ MILK PRODUCTS). ARRIVE AT 1230. CURRENT LAB RESULTS, CXR AND EKG IN EPIC AND CHART. WILL TAKE KLONOPIN AND PROZAC AM DOS W/ SIP OF WATER AND DO FLEET ENEMA.

## 2013-02-01 ENCOUNTER — Telehealth: Payer: Self-pay | Admitting: *Deleted

## 2013-02-01 ENCOUNTER — Ambulatory Visit: Payer: BC Managed Care – PPO

## 2013-02-01 NOTE — Telephone Encounter (Signed)
CALLED PATIENT TO REMIND OF PROCEDURE FOR 02-02-13, SPOKE WITH PATIENT AND HE IS AWARE OF THIS PROCEDURE.

## 2013-02-02 ENCOUNTER — Ambulatory Visit (HOSPITAL_BASED_OUTPATIENT_CLINIC_OR_DEPARTMENT_OTHER): Payer: BC Managed Care – PPO | Admitting: Anesthesiology

## 2013-02-02 ENCOUNTER — Ambulatory Visit (HOSPITAL_COMMUNITY): Payer: BC Managed Care – PPO

## 2013-02-02 ENCOUNTER — Encounter (HOSPITAL_BASED_OUTPATIENT_CLINIC_OR_DEPARTMENT_OTHER)
Admission: RE | Disposition: A | Discharge status: Home / Self Care | Payer: Self-pay | Source: Ambulatory Visit | Attending: Urology

## 2013-02-02 ENCOUNTER — Encounter (HOSPITAL_BASED_OUTPATIENT_CLINIC_OR_DEPARTMENT_OTHER): Payer: BC Managed Care – PPO | Admitting: Anesthesiology

## 2013-02-02 ENCOUNTER — Encounter (HOSPITAL_BASED_OUTPATIENT_CLINIC_OR_DEPARTMENT_OTHER): Payer: Self-pay | Admitting: Anesthesiology

## 2013-02-02 ENCOUNTER — Ambulatory Visit (HOSPITAL_BASED_OUTPATIENT_CLINIC_OR_DEPARTMENT_OTHER)
Admission: RE | Admit: 2013-02-02 | Discharge: 2013-02-02 | Disposition: A | Discharge status: Home / Self Care | Payer: BC Managed Care – PPO | Source: Ambulatory Visit | Attending: Urology | Admitting: Urology

## 2013-02-02 DIAGNOSIS — C61 Malignant neoplasm of prostate: Secondary | ICD-10-CM | POA: Insufficient documentation

## 2013-02-02 DIAGNOSIS — M899 Disorder of bone, unspecified: Secondary | ICD-10-CM | POA: Insufficient documentation

## 2013-02-02 DIAGNOSIS — Z923 Personal history of irradiation: Secondary | ICD-10-CM | POA: Insufficient documentation

## 2013-02-02 DIAGNOSIS — K219 Gastro-esophageal reflux disease without esophagitis: Secondary | ICD-10-CM | POA: Insufficient documentation

## 2013-02-02 DIAGNOSIS — Z860100 Personal history of colon polyps, unspecified: Secondary | ICD-10-CM | POA: Insufficient documentation

## 2013-02-02 HISTORY — DX: Diverticulosis of large intestine without perforation or abscess without bleeding: K57.30

## 2013-02-02 HISTORY — DX: Personal history of urinary calculi: Z87.442

## 2013-02-02 HISTORY — DX: Major depressive disorder, single episode, unspecified: F32.9

## 2013-02-02 HISTORY — DX: Cough: R05

## 2013-02-02 HISTORY — DX: Generalized anxiety disorder: F41.1

## 2013-02-02 HISTORY — DX: Cough, unspecified: R05.9

## 2013-02-02 HISTORY — DX: Chronic sinusitis, unspecified: J32.9

## 2013-02-02 HISTORY — PX: RADIOACTIVE SEED IMPLANT: SHX5150

## 2013-02-02 HISTORY — DX: Personal history of colon polyps, unspecified: Z86.0100

## 2013-02-02 HISTORY — DX: Gastro-esophageal reflux disease without esophagitis: K21.9

## 2013-02-02 HISTORY — DX: Nocturia: R35.1

## 2013-02-02 HISTORY — DX: Panic disorder (episodic paroxysmal anxiety): F41.0

## 2013-02-02 HISTORY — DX: Unspecified hemorrhoids: K64.9

## 2013-02-02 HISTORY — DX: Malignant neoplasm of prostate: C61

## 2013-02-02 HISTORY — DX: Personal history of colonic polyps: Z86.010

## 2013-02-02 SURGERY — INSERTION, RADIATION SOURCE, PROSTATE
Anesthesia: General | Site: Prostate

## 2013-02-02 MED ORDER — MIDAZOLAM HCL 2 MG/2ML IJ SOLN
INTRAMUSCULAR | Status: AC
  Filled 2013-02-02: qty 2

## 2013-02-02 MED ORDER — PHENAZOPYRIDINE HCL 100 MG PO TABS
100.0000 mg | ORAL_TABLET | Freq: Three times a day (TID) | ORAL | Status: DC
  Administered 2013-02-02: 100 mg via ORAL
  Filled 2013-02-02: qty 1

## 2013-02-02 MED ORDER — MIDAZOLAM HCL 5 MG/5ML IJ SOLN
INTRAMUSCULAR | Status: DC | PRN
  Administered 2013-02-02 (×2): 1 mg via INTRAVENOUS

## 2013-02-02 MED ORDER — FENTANYL CITRATE 0.05 MG/ML IJ SOLN
INTRAMUSCULAR | Status: DC | PRN
  Administered 2013-02-02: 25 ug via INTRAVENOUS
  Administered 2013-02-02: 50 ug via INTRAVENOUS
  Administered 2013-02-02: 25 ug via INTRAVENOUS

## 2013-02-02 MED ORDER — TAMSULOSIN HCL 0.4 MG PO CAPS: 0.8000 mg | ORAL_CAPSULE | Freq: Every day | ORAL | Status: DC

## 2013-02-02 MED ORDER — PROMETHAZINE HCL 25 MG/ML IJ SOLN
INTRAMUSCULAR | Status: AC
  Filled 2013-02-02: qty 1

## 2013-02-02 MED ORDER — IOHEXOL 350 MG/ML SOLN
INTRAVENOUS | Status: DC | PRN
  Administered 2013-02-02: 7 mL

## 2013-02-02 MED ORDER — CEFAZOLIN SODIUM-DEXTROSE 2-3 GM-% IV SOLR
2.0000 g | Freq: Once | INTRAVENOUS | Status: AC
  Administered 2013-02-02: 2 g via INTRAVENOUS
  Filled 2013-02-02: qty 50

## 2013-02-02 MED ORDER — FLEET ENEMA 7-19 GM/118ML RE ENEM
1.0000 | ENEMA | Freq: Once | RECTAL | Status: DC
  Filled 2013-02-02: qty 1

## 2013-02-02 MED ORDER — PHENAZOPYRIDINE HCL 100 MG PO TABS
ORAL_TABLET | ORAL | Status: AC
  Filled 2013-02-02: qty 1

## 2013-02-02 MED ORDER — PHENAZOPYRIDINE HCL 100 MG PO TABS: 100.0000 mg | ORAL_TABLET | Freq: Three times a day (TID) | ORAL | Status: DC | PRN

## 2013-02-02 MED ORDER — LACTATED RINGERS IV SOLN
INTRAVENOUS | Status: DC
  Administered 2013-02-02 (×3): via INTRAVENOUS
  Filled 2013-02-02: qty 1000

## 2013-02-02 MED ORDER — KETOROLAC TROMETHAMINE 30 MG/ML IJ SOLN
15.0000 mg | Freq: Once | INTRAMUSCULAR | Status: DC | PRN
  Filled 2013-02-02: qty 1

## 2013-02-02 MED ORDER — PROMETHAZINE HCL 25 MG/ML IJ SOLN
6.2500 mg | INTRAMUSCULAR | Status: DC | PRN
  Administered 2013-02-02: 6.25 mg via INTRAVENOUS
  Filled 2013-02-02: qty 1

## 2013-02-02 MED ORDER — HYDROCODONE-ACETAMINOPHEN 5-325 MG PO TABS: 1.0000 | ORAL_TABLET | ORAL | Status: DC | PRN

## 2013-02-02 MED ORDER — FENTANYL CITRATE 0.05 MG/ML IJ SOLN
INTRAMUSCULAR | Status: AC
  Filled 2013-02-02: qty 6

## 2013-02-02 MED ORDER — BELLADONNA ALKALOIDS-OPIUM 16.2-60 MG RE SUPP
RECTAL | Status: DC | PRN
  Administered 2013-02-02: 1 via RECTAL

## 2013-02-02 MED ORDER — STERILE WATER FOR IRRIGATION IR SOLN
Status: DC | PRN
  Administered 2013-02-02: 500 mL

## 2013-02-02 MED ORDER — EPHEDRINE SULFATE 50 MG/ML IJ SOLN
INTRAMUSCULAR | Status: DC | PRN
  Administered 2013-02-02 (×5): 10 mg via INTRAVENOUS

## 2013-02-02 MED ORDER — HYDROMORPHONE HCL PF 1 MG/ML IJ SOLN
0.2500 mg | INTRAMUSCULAR | Status: DC | PRN
  Filled 2013-02-02: qty 1

## 2013-02-02 MED ORDER — BELLADONNA ALKALOIDS-OPIUM 16.2-60 MG RE SUPP
RECTAL | Status: AC
  Filled 2013-02-02: qty 1

## 2013-02-02 MED ORDER — PROPOFOL 10 MG/ML IV BOLUS
INTRAVENOUS | Status: DC | PRN
  Administered 2013-02-02: 20 mg via INTRAVENOUS
  Administered 2013-02-02: 150 mg via INTRAVENOUS

## 2013-02-02 MED ORDER — DEXAMETHASONE SODIUM PHOSPHATE 4 MG/ML IJ SOLN
INTRAMUSCULAR | Status: DC | PRN
  Administered 2013-02-02: 10 mg via INTRAVENOUS

## 2013-02-02 MED ORDER — SENNOSIDES-DOCUSATE SODIUM 8.6-50 MG PO TABS: 1.0000 | ORAL_TABLET | Freq: Two times a day (BID) | ORAL | Status: DC

## 2013-02-02 MED ORDER — LIDOCAINE HCL (CARDIAC) 20 MG/ML IV SOLN
INTRAVENOUS | Status: DC | PRN
  Administered 2013-02-02: 50 mg via INTRAVENOUS

## 2013-02-02 MED ORDER — ONDANSETRON HCL 4 MG/2ML IJ SOLN
INTRAMUSCULAR | Status: DC | PRN
  Administered 2013-02-02: 4 mg via INTRAVENOUS

## 2013-02-02 MED ORDER — ACETAMINOPHEN 10 MG/ML IV SOLN
INTRAVENOUS | Status: DC | PRN
  Administered 2013-02-02: 1000 mg via INTRAVENOUS

## 2013-02-02 MED ORDER — HYDROCODONE-ACETAMINOPHEN 5-325 MG PO TABS
1.0000 | ORAL_TABLET | ORAL | Status: DC | PRN
  Filled 2013-02-02: qty 2

## 2013-02-02 SURGICAL SUPPLY — 27 items
BAG URINE DRAINAGE (UROLOGICAL SUPPLIES) ×2 IMPLANT
BLADE SURG ROTATE 9660 (MISCELLANEOUS) ×2 IMPLANT
CANISTER SUCT LVC 12 LTR MEDI- (MISCELLANEOUS) ×1 IMPLANT
CATH FOLEY 2WAY SLVR  5CC 16FR (CATHETERS) ×2
CATH FOLEY 2WAY SLVR 5CC 16FR (CATHETERS) ×2 IMPLANT
CATH ROBINSON RED A/P 20FR (CATHETERS) ×2 IMPLANT
CLOTH BEACON ORANGE TIMEOUT ST (SAFETY) ×2 IMPLANT
COVER MAYO STAND STRL (DRAPES) ×2 IMPLANT
COVER TABLE BACK 60X90 (DRAPES) ×2 IMPLANT
DRSG TEGADERM 4X4.75 (GAUZE/BANDAGES/DRESSINGS) ×2 IMPLANT
DRSG TEGADERM 8X12 (GAUZE/BANDAGES/DRESSINGS) ×2 IMPLANT
GAUZE SPONGE 4X4 12PLY STRL LF (GAUZE/BANDAGES/DRESSINGS) ×1 IMPLANT
GLOVE BIO SURGEON STRL SZ 6.5 (GLOVE) ×1 IMPLANT
GLOVE BIO SURGEON STRL SZ7 (GLOVE) ×1 IMPLANT
GLOVE BIO SURGEON STRL SZ7.5 (GLOVE) ×4 IMPLANT
GLOVE ECLIPSE 7.0 STRL STRAW (GLOVE) ×2 IMPLANT
GLOVE INDICATOR 7.0 STRL GRN (GLOVE) ×2 IMPLANT
GLOVE INDICATOR 7.5 STRL GRN (GLOVE) ×2 IMPLANT
GOWN STRL NON-REIN LRG LVL3 (GOWN DISPOSABLE) ×1 IMPLANT
GOWN STRL REIN XL XLG (GOWN DISPOSABLE) ×2 IMPLANT
HOLDER FOLEY CATH W/STRAP (MISCELLANEOUS) ×1 IMPLANT
PACK CYSTOSCOPY (CUSTOM PROCEDURE TRAY) ×2 IMPLANT
SYRINGE 10CC LL (SYRINGE) ×3 IMPLANT
UNDERPAD 30X30 INCONTINENT (UNDERPADS AND DIAPERS) ×4 IMPLANT
WATER STERILE IRR 3000ML UROMA (IV SOLUTION) ×2 IMPLANT
WATER STERILE IRR 500ML POUR (IV SOLUTION) ×2 IMPLANT
radioactive seed ×1 IMPLANT

## 2013-02-02 NOTE — Anesthesia Preprocedure Evaluation (Signed)
Anesthesia Evaluation  Patient identified by MRN, date of birth, ID band Patient awake    Reviewed: Allergy & Precautions, H&P , NPO status , Patient's Chart, lab work & pertinent test results  Airway Mallampati: II TM Distance: >3 FB Neck ROM: Full    Dental no notable dental hx.    Pulmonary neg pulmonary ROS,  breath sounds clear to auscultation  Pulmonary exam normal       Cardiovascular hypertension, Pt. on medications Rhythm:Regular Rate:Normal     Neuro/Psych negative neurological ROS  negative psych ROS   GI/Hepatic Neg liver ROS,   Endo/Other  negative endocrine ROS  Renal/GU negative Renal ROS  negative genitourinary   Musculoskeletal negative musculoskeletal ROS (+)   Abdominal   Peds negative pediatric ROS (+)  Hematology negative hematology ROS (+)   Anesthesia Other Findings   Reproductive/Obstetrics negative OB ROS                           Anesthesia Physical Anesthesia Plan  ASA: II  Anesthesia Plan: General   Post-op Pain Management:    Induction: Intravenous  Airway Management Planned: LMA  Additional Equipment:   Intra-op Plan:   Post-operative Plan:   Informed Consent: I have reviewed the patients History and Physical, chart, labs and discussed the procedure including the risks, benefits and alternatives for the proposed anesthesia with the patient or authorized representative who has indicated his/her understanding and acceptance.   Dental advisory given  Plan Discussed with: CRNA and Surgeon  Anesthesia Plan Comments:         Anesthesia Quick Evaluation

## 2013-02-02 NOTE — Anesthesia Postprocedure Evaluation (Signed)
  Anesthesia Post-op Note  Patient: Eduardo Fuentes  Procedure(s) Performed: Procedure(s) (LRB): RADIOACTIVE SEED IMPLANT (N/A)  Patient Location: PACU  Anesthesia Type: General  Level of Consciousness: awake and alert   Airway and Oxygen Therapy: Patient Spontanous Breathing  Post-op Pain: mild  Post-op Assessment: Post-op Vital signs reviewed, Patient's Cardiovascular Status Stable, Respiratory Function Stable, Patent Airway and No signs of Nausea or vomiting  Last Vitals:  Filed Vitals:   02/02/13 1745  BP: 122/75  Pulse: 74  Temp:   Resp: 16    Post-op Vital Signs: stable   Complications: No apparent anesthesia complications

## 2013-02-02 NOTE — Anesthesia Procedure Notes (Signed)
Procedure Name: LMA Insertion Date/Time: 02/02/2013 3:04 PM Performed by: Norva Pavlov Pre-anesthesia Checklist: Patient identified, Emergency Drugs available, Suction available and Patient being monitored Patient Re-evaluated:Patient Re-evaluated prior to inductionOxygen Delivery Method: Circle System Utilized Preoxygenation: Pre-oxygenation with 100% oxygen Intubation Type: IV induction Ventilation: Mask ventilation without difficulty LMA: LMA inserted LMA Size: 5.0 Number of attempts: 1 Airway Equipment and Method: bite block Placement Confirmation: positive ETCO2 Tube secured with: Tape Dental Injury: Teeth and Oropharynx as per pre-operative assessment

## 2013-02-02 NOTE — Op Note (Signed)
Date of Procedure: 02/02/13       Operative Report  Surgeon: Natalia Leatherwood, MD  Assistant: Artist Pais. Kathrynn Running, MD with Radiation Oncology.  Preoperative Diagnosis: Prostate cancer  Postoperative Diagnosis: Prostate cancer  Procedure Performed: Placement of radioactive seed implantation into the prostate. Cystoscopy  Estimated Blood Loss: Minimal  Drains:  Foley catheter.  Specimen: None  Complications: None  Findings: Slightly smaller prostate. No spacers or seeds in the urethra or bladder. Clear efflux from ureters bilaterally.  History of Present Illness: This is a pleasant 62 year old male with prostate cancer. He was counseled regarding his treatment options and has elected for IMRT with brachytherapy. He presents today for that procedure.  Procedure: I was called to the operating room once the patient had already been positioned in the dorsal lithotomy position under the direction of Dr. Kathrynn Running. His genitals and perineum had been prepped and draped in the usual sterile fashion. General anesthesia had already been induced.  The rectal probe along with the frame and the prostate seed placement device as well as the anchoring needles had already been placed. A separate timeout was performed in which the surgical procedure, the patient, and location were all identified and agreed upon by the team. Following this, there was placement under ultrasound guidance of brachytherapy seeds transperineally. A total of 20 activated needles were used. The total number of seeds implanted were 53. Seed type was Iodine-125.   After placement of all the seeds and fluoroscopy of the patient's pelvis, flexible cystoscopy was performed. There were no seeds or spacers in the urethra or bladder.  Clear efflux from the ureters bilaterally.  After this, the cystoscope was removed and a Foley catheter was placed with 10 cc of sterile water in the balloon. This completed the procedure. The  patient was placed back in a supine position, and anesthesia reversed. He was taken to the PACU in stable condition.   All counts were correct at the end of the case.

## 2013-02-02 NOTE — Transfer of Care (Signed)
Immediate Anesthesia Transfer of Care Note  Patient: Eduardo Fuentes  Procedure(s) Performed: Procedure(s) (LRB): RADIOACTIVE SEED IMPLANT (N/A)  Patient Location: PACU  Anesthesia Type: General  Level of Consciousness: awake, alert  and oriented  Airway & Oxygen Therapy: Patient Spontanous Breathing and Patient connected to face mask oxygen  Post-op Assessment: Report given to PACU RN and Post -op Vital signs reviewed and stable  Post vital signs: Reviewed and stable  Complications: No apparent anesthesia complications

## 2013-02-02 NOTE — H&P (Signed)
Urology History and Physical Exam  CC: Prostate cancer  HPI: 62 year old male presents for prostate cancer. He was found to have an elevated PSA velocity with a maximum PSA of approximately 3.2. He underwent prostate biopsy which revealed Gleason 4+3 equals 7 in one core, 3+4 equals 7 in one core, and 3+3 equal 6 and 4 course. He was counseled regarding treatment options. He elected to proceed with external beam radiation therapy which he completed with Dr. Kathrynn Running in November of 2014. He also plan to proceed with brachytherapy boost which he presents today for this procedure. Prebiopsy AUA symptom score was 11 with quality of life score of 2. Preoperative shim score was 18. Transrectal ultrasound volume was 30cc. Pretreatment staging was negative except for an issue a bone lesion which was felt to be benign.  PMH: Past Medical History  Diagnosis Date  . Diverticulosis   . Tinnitus     LEFT EAR -- CHRONIC  . High blood pressure   . Microscopic hematuria   . Prostate cancer   . Diverticulosis of colon   . History of colon polyps   . Generalized anxiety disorder   . Panic attacks   . Depressive disorder     follow by dr Evelene Croon psychiatry  . History of kidney stones   . Sinus infection   . Cough     NON-PRODUCTIVE  . GERD (gastroesophageal reflux disease)     WATCHES DIET  . Nocturia   . Hemorrhoid     PSH: Past Surgical History  Procedure Laterality Date  . Prostate biopsy    . Extracorporeal shock wave lithotripsy  YRS AGO  . Tonsillectomy and adenoidectomy  AS CHILD    Allergies: Allergies  Allergen Reactions  . Aleve [Naproxen Sodium] Other (See Comments)    GI UPSET    Medications: No prescriptions prior to admission     Social History: History   Social History  . Marital Status: Married    Spouse Name: N/A    Number of Children: N/A  . Years of Education: N/A   Occupational History  . Not on file.   Social History Main Topics  . Smoking status: Never  Smoker   . Smokeless tobacco: Never Used  . Alcohol Use: No  . Drug Use: No  . Sexual Activity: No   Other Topics Concern  . Not on file   Social History Narrative  . No narrative on file    Family History: Family History  Problem Relation Age of Onset  . Dementia Mother   . Anxiety disorder Mother   . Alzheimer's disease Mother   . Heart disease Father   . Bone cancer Father   . Cancer Father     prostate  . Colon cancer Neg Hx   . Esophageal cancer Neg Hx   . Rectal cancer Neg Hx   . Stomach cancer Neg Hx   . Anxiety disorder Sister     Review of Systems: Positive: Sinus infection. Negative: Fever, SOB, or chest pain.  A further 10 point review of systems was negative except what is listed in the HPI.  Physical Exam: Filed Vitals:   02/02/13 1247  BP: 134/95  Pulse: 96  Temp: 96.9 F (36.1 C)  Resp: 16    General: No acute distress.  Awake. Head:  Normocephalic.  Atraumatic. ENT:  EOMI.  Mucous membranes moist Neck:  Supple.  No lymphadenopathy. CV:  S1 present. S2 present. Regular rate. Pulmonary: Equal effort  bilaterally.  Clear to auscultation bilaterally. Abdomen: Soft.  Non- tender to palpation. Skin:  Normal turgor.  No visible rash. Extremity: No gross deformity of bilateral upper extremities.  No gross deformity of    bilateral lower extremities. Neurologic: Alert. Appropriate mood.    Studies:  No results found for this basename: HGB, WBC, PLT,  in the last 72 hours  No results found for this basename: NA, K, CL, CO2, BUN, CREATININE, CALCIUM, MAGNESIUM, GFRNONAA, GFRAA,  in the last 72 hours   No results found for this basename: PT, INR, APTT,  in the last 72 hours   No components found with this basename: ABG,     Assessment:  Prostate cancer  Plan: Proceed to the operating room for transrectal ultrasound, transperineal percutaneous placement of radioactive brachytherapy seeds, and cystoscopy.  He has started on Augmentin on  Monday for sinus infection. This should cover for postop as well.

## 2013-02-03 ENCOUNTER — Encounter (HOSPITAL_BASED_OUTPATIENT_CLINIC_OR_DEPARTMENT_OTHER): Payer: Self-pay | Admitting: Urology

## 2013-02-04 NOTE — Addendum Note (Signed)
Addendum created 02/04/13 1240 by Einar Pheasant, MD   Modules edited: Anesthesia Responsible Staff

## 2013-02-06 NOTE — Procedures (Signed)
  Radiation Oncology         [phone]) [phone] ________________________________  Name: Eduardo Fuentes MRN: [id number]  Date: 02/06/2013  DOB: March 03, 1950       Prostate Seed Implant  GE:[email or web address],KGMWN, MD  No ref. provider found  DIAGNOSIS: 62 y.o. gentleman with stage T1c adenocarcinoma of the prostate with a Gleason's score of 4+3 and a PSA of 3.25  PROCEDURE: Insertion of radioactive I-125 seeds into the prostate gland.  RADIATION DOSE: 110 Gy, boost therapy.  TECHNIQUE: Eduardo Fuentes was brought to the operating room with the urologist. He was placed in the dorsolithotomy position. He was catheterized and a rectal tube was inserted. The perineum was shaved, prepped and draped. The ultrasound probe was then introduced into the rectum to see the prostate gland.  TREATMENT DEVICE: A needle grid was attached to the ultrasound probe stand and anchor needles were placed.  3D PLANNING: The prostate was imaged in 3D using a sagittal sweep of the prostate probe. These images were transferred to the planning computer. There, the prostate, urethra and rectum were defined on each axial reconstructed image. Then, the software created an optimized plan and a few seed positions were adjusted. The quality of the plan was evaluated in terms of dose volume histograms and isodose reconstructions.  Then the accepted plan was uploaded to the seed Selectron afterloading unit.  SPECIAL TREATMENT PROCEDURE/SUPERVISION AND HANDLING: The Nucletron FIRST system was used to place the needles under sagittal guidance. A total of 22 needles were used to deposit 53 seeds in the prostate gland. The individual seed activity was 0.311 mCi for a total implant activity of 46.483 mCi.  COMPLEX SIMULATION: At the end of the procedure, an anterior radiograph of the pelvis was obtained to document seed positioning and count. Cystoscopy was performed to check the urethra and bladder.  MICRODOSIMETRY: At the end of the procedure,  the patient was emitting 0.046 mrem/hr at 1 meter. Accordingly, he was considered safe for hospital discharge.  PLAN: The patient will return to the radiation oncology clinic for post implant CT dosimetry in three weeks.   ________________________________  Artist Pais Kathrynn Running, M.D.

## 2013-02-10 ENCOUNTER — Ambulatory Visit: Payer: BC Managed Care – PPO | Admitting: Radiation Oncology

## 2013-03-03 ENCOUNTER — Telehealth: Payer: Self-pay | Admitting: *Deleted

## 2013-03-03 NOTE — Telephone Encounter (Signed)
CALLED PATIENT TO REMIND OF APPTS. FOR 03-04-13, SPOKE WITH PATIENT AND HE IS AWARE OF THESE APPTS.

## 2013-03-04 ENCOUNTER — Encounter: Payer: Self-pay | Admitting: Radiation Oncology

## 2013-03-04 ENCOUNTER — Ambulatory Visit
Admission: RE | Admit: 2013-03-04 | Discharge: 2013-03-04 | Disposition: A | Discharge status: Home / Self Care | Payer: BC Managed Care – PPO | Source: Ambulatory Visit | Attending: Radiation Oncology | Admitting: Radiation Oncology

## 2013-03-04 VITALS — BP 121/83 | HR 88 | Temp 97.5°F | Resp 16 | Wt 190.0 lb

## 2013-03-04 DIAGNOSIS — C61 Malignant neoplasm of prostate: Secondary | ICD-10-CM

## 2013-03-04 DIAGNOSIS — R3911 Hesitancy of micturition: Secondary | ICD-10-CM | POA: Insufficient documentation

## 2013-03-04 DIAGNOSIS — R350 Frequency of micturition: Secondary | ICD-10-CM | POA: Insufficient documentation

## 2013-03-04 HISTORY — PX: COLONOSCOPY: SHX174

## 2013-03-04 NOTE — Progress Notes (Signed)
Reports that if he is active urinary frequency is less. Patient reports when he decides he has to go to the bathroom to void he can hardly make it before urine start coming out. Reports nocturia x4. Reports that he can completely empty his bladder if he pinches and pull his penis. Patient reports post void dribble. Reports occasional diarrhea. Reports occasional hot flashes. Patient reports he has not had a hormone injection since August of 2014. Patient reports that he stopped taking flomax because it made him lightheaded. Patient scheduled to see Dr. Jasmine December next week. Encouraged patient to discuss flomax with Dr. Jasmine December and encouraged him to try taking the medication at night before bed. Patient verbalized understanding. Post seed IPSS 9.

## 2013-03-06 ENCOUNTER — Encounter: Payer: Self-pay | Admitting: Radiation Oncology

## 2013-03-06 NOTE — Progress Notes (Signed)
  Radiation Oncology         (336) [phone] ________________________________  Name: Eduardo Fuentes MRN: [id number]  Date: 03/04/2013  DOB: March 07, 1950  COMPLEX SIMULATION NOTE  NARRATIVE:  The patient was brought to the [facility] today following prostate seed implantation approximately one month ago.  Identity was confirmed.  All relevant records and images related to the planned course of therapy were reviewed.  Then, the patient was set-up supine.  CT images were obtained.  The CT images were loaded into the planning software.  Then the prostate and rectum were contoured.  Treatment planning then occurred.  The implanted iodine 125 seeds were identified by the physics staff for projection of radiation distribution  I have requested : 3D Simulation  I have requested a DVH of the following structures: Prostate and rectum.    ________________________________  Sheral Apley Tammi Klippel, M.D.

## 2013-03-06 NOTE — Progress Notes (Signed)
Radiation Oncology         [phone] ________________________________  Name: NYKO GELL MRN: [id number]  Date: 03/04/2013  DOB: 01/17/1951  Follow-Up Visit Note  CC: Lujean Amel, MD  Molli Hazard,*  Diagnosis:   63 y.o. gentleman with stage T1c adenocarcinoma of the prostate with a Gleason's score of 4+3 and a PSA of 3.25  Interval Since Last Radiation:  3  weeks  Narrative:  The patient returns today for routine follow-up.  He is complaining of increased urinary frequency and urinary hesitation symptoms. He filled out a questionnaire regarding urinary function today providing and overall IPSS score of 11 characterizing his symptoms as moderate.  His pre-implant score was 7. He denies any bowel symptoms.  ALLERGIES:  is allergic to aleve.  Meds: Current Outpatient Prescriptions  Medication Sig Dispense Refill  . clonazePAM (KLONOPIN) 1 MG tablet Take 1 tablet at 8 am, 1/2 tablet at 2 pm and 1 tablet at bedtime for anxiety.  75 tablet  0  . FLUoxetine (PROZAC) 20 MG capsule Take 20 mg by mouth 2 (two) times daily.       . hydrochlorothiazide (HYDRODIURIL) 12.5 MG tablet Take 12.5 mg by mouth daily.      . nortriptyline (PAMELOR) 50 MG capsule Take 50 mg by mouth at bedtime.      . senna-docusate (SENOKOT S) 8.6-50 MG per tablet Take 1 tablet by mouth 2 (two) times daily.  60 tablet  0  . amoxicillin-clavulanate (AUGMENTIN) 875-125 MG per tablet Take 1 tablet by mouth 2 (two) times daily.      Marland Kitchen HYDROcodone-acetaminophen (NORCO/VICODIN) 5-325 MG per tablet Take 1-2 tablets by mouth every 4 (four) hours as needed for moderate pain.  35 tablet  0  . phenazopyridine (PYRIDIUM) 100 MG tablet Take 1 tablet (100 mg total) by mouth every 8 (eight) hours as needed for pain (Burning urination.  Will turn urine and body fluids orange.).  30 tablet  1  . tamsulosin (FLOMAX) 0.4 MG CAPS capsule Take 2 capsules (0.8 mg total) by mouth daily.  60 capsule  11   No current  facility-administered medications for this encounter.    Physical Findings: The patient is in no acute distress. Patient is alert and oriented.  weight is 190 lb (86.183 kg). His oral temperature is 97.5 F (36.4 C). His blood pressure is 121/83 and his pulse is 88. His respiration is 16 and oxygen saturation is 100%. .  No significant changes.  Lab Findings: Lab Results  Component Value Date   WBC 4.4 01/26/2013   HGB 14.7 01/26/2013   HCT 41.4 01/26/2013   MCV 90.2 01/26/2013   PLT 257 01/26/2013    Radiographic Findings:  Patient underwent CT imaging in our clinic for post implant dosimetry. The CT appears to demonstrate an adequate distribution of radioactive seeds throughout the prostate gland. There no seeds in her near the rectum. I suspect the final radiation plan and dosimetry will show appropriate coverage of the prostate gland.   Impression: The patient is recovering from the effects of radiation. His urinary symptoms should gradually improve over the next 4-6 months. We talked about this today. He is encouraged by his improvement already and is otherwise please with his outcome.   Plan: Today, I spent time talking to the patient about his prostate seed implant and resolving urinary symptoms. We also talked about long-term follow-up for prostate cancer following seed implant. He understands that ongoing PSA determinations and digital rectal exams  will help perform surveillance to rule out disease recurrence. He understands what to expect with his PSA measures. Patient was also educated today about some of the long-term effects from radiation including a small risk for rectal bleeding and possibly erectile dysfunction. We talked about some of the general management approaches to these potential complications. However, I did encourage the patient to contact our office or return at any point if he has questions or concerns related to his previous radiation and prostate  cancer.  _____________________________________  Sheral Apley. Tammi Klippel, M.D.

## 2013-03-11 ENCOUNTER — Other Ambulatory Visit: Payer: Self-pay

## 2013-03-11 DIAGNOSIS — Z1211 Encounter for screening for malignant neoplasm of colon: Secondary | ICD-10-CM

## 2013-03-11 MED ORDER — MOVIPREP 100 G PO SOLR: 1.0000 | Freq: Once | ORAL | Status: DC

## 2013-03-14 ENCOUNTER — Encounter: Payer: Self-pay | Admitting: Internal Medicine

## 2013-03-14 ENCOUNTER — Ambulatory Visit (AMBULATORY_SURGERY_CENTER): Payer: BC Managed Care – PPO | Admitting: Internal Medicine

## 2013-03-14 VITALS — BP 127/92 | HR 74 | Temp 96.5°F | Resp 14 | Ht 69.0 in | Wt 190.0 lb

## 2013-03-14 DIAGNOSIS — Z1211 Encounter for screening for malignant neoplasm of colon: Secondary | ICD-10-CM

## 2013-03-14 DIAGNOSIS — Z860100 Personal history of colon polyps, unspecified: Secondary | ICD-10-CM

## 2013-03-14 MED ORDER — SODIUM CHLORIDE 0.9 % IV SOLN: 500.0000 mL | INTRAVENOUS | Status: DC

## 2013-03-14 NOTE — Patient Instructions (Signed)
YOU HAD AN ENDOSCOPIC PROCEDURE TODAY AT THE [facility]: Refer to the procedure report that was given to you for any specific questions about what was found during the examination.  If the procedure report does not answer your questions, please call your gastroenterologist to clarify.  If you requested that your care partner not be given the details of your procedure findings, then the procedure report has been included in a sealed envelope for you to review at your convenience later.  YOU SHOULD EXPECT: Some feelings of bloating in the abdomen. Passage of more gas than usual.  Walking can help get rid of the air that was put into your GI tract during the procedure and reduce the bloating. If you had a lower endoscopy (such as a colonoscopy or flexible sigmoidoscopy) you may notice spotting of blood in your stool or on the toilet paper. If you underwent a bowel prep for your procedure, then you may not have a normal bowel movement for a few days.  DIET: Your first meal following the procedure should be a light meal and then it is ok to progress to your normal diet.  A half-sandwich or bowl of soup is an example of a good first meal.  Heavy or fried foods are harder to digest and may make you feel nauseous or bloated.  Likewise meals heavy in dairy and vegetables can cause extra gas to form and this can also increase the bloating.  Drink plenty of fluids but you should avoid alcoholic beverages for 24 hours.  ACTIVITY: Your care partner should take you home directly after the procedure.  You should plan to take it easy, moving slowly for the rest of the day.  You can resume normal activity the day after the procedure however you should NOT DRIVE or use heavy machinery for 24 hours (because of the sedation medicines used during the test).    SYMPTOMS TO REPORT IMMEDIATELY: A gastroenterologist can be reached at any hour.  During normal business hours, 8:30 AM to 5:00 PM Monday through Friday,  call [phone].  After hours and on weekends, please call the GI answering service at [phone] who will take a message and have the physician on call contact you.   Following lower endoscopy (colonoscopy or flexible sigmoidoscopy):  Excessive amounts of blood in the stool  Significant tenderness or worsening of abdominal pains  Swelling of the abdomen that is new, acute  Fever of 100F or higher    FOLLOW UP: If any biopsies were taken you will be contacted by phone or by letter within the next 1-3 weeks.  Call your gastroenterologist if you have not heard about the biopsies in 3 weeks.  Our staff will call the home number listed on your records the next business day following your procedure to check on you and address any questions or concerns that you may have at that time regarding the information given to you following your procedure. This is a courtesy call and so if there is no answer at the home number and we have not heard from you through the emergency physician on call, we will assume that you have returned to your regular daily activities without incident.  SIGNATURES/CONFIDENTIALITY: You and/or your care partner have signed paperwork which will be entered into your electronic medical record.  These signatures attest to the fact that that the information above on your After Visit Summary has been reviewed and is understood.  Full responsibility of the confidentiality   this discharge information lies with you and/or your care-partner.   INFORMATION ON DIVERTICULOSIS GIVEN TO YOU TODAY 

## 2013-03-14 NOTE — Op Note (Signed)
[place]  [address] [place], [address]   COLONOSCOPY PROCEDURE REPORT  PATIENT: Eduardo Fuentes, Eduardo Fuentes  MR#: [id number] BIRTHDATE: [birth year] , 22  yrs. old GENDER: Male ENDOSCOPIST: Eustace Quail, MD REFERRED [id number] Program Recall PROCEDURE DATE:  03/14/2013 PROCEDURE:   Colonoscopy, surveillance First Screening Colonoscopy - Avg.  risk and is 50 yrs.  old or older - No.  Prior Negative Screening - Now for repeat screening. N/A  History of Adenoma - Now for follow-up colonoscopy & has been > or = to 3 yrs.  Yes hx of adenoma.  Has been 3 or more years since last colonoscopy.  Polyps Removed Today? No.  Recommend repeat exam, <10 yrs? No. ASA CLASS:   Class II INDICATIONS:Patient's personal history of adenomatous colon polyps. index exam April 2009 with small tubular adenomas MEDICATIONS: MAC sedation, administered by CRNA and propofol (Diprivan) 230mg  IV  DESCRIPTION OF PROCEDURE:   After the risks benefits and alternatives of the procedure were thoroughly explained, informed consent was obtained.  A digital rectal exam revealed no abnormalities of the rectum.   The LB [id number]  endoscope was introduced through the anus and advanced to the cecum, which was identified by both the appendix and ileocecal valve. No adverse events experienced.   The quality of the prep was good, using MoviPrep  The instrument was then slowly withdrawn as the colon was fully examined.      COLON FINDINGS: Moderate diverticulosis was noted throughout the entire examined colon.   The colon mucosa was otherwise normal. Retroflexed views revealed internal hemorrhoids. The time to cecum=1 minutes 55 seconds.  Withdrawal time=10 minutes 21 seconds. The scope was withdrawn and the procedure completed. COMPLICATIONS: There were no complications.  ENDOSCOPIC IMPRESSION: 1.   Moderate diverticulosis was noted throughout the entire examined colon 2.   The colon  mucosa was otherwise normal  RECOMMENDATIONS: 1. Continue current colorectal screening recommendations for "routine risk" patients with a repeat colonoscopy in 10 years.   eSigned:  Eustace Quail, MD 03/14/2013 9:24 AM   cc: The Patient and [name] Dorthy Cooler, MD

## 2013-03-14 NOTE — Progress Notes (Signed)
Report to pacu rn, vss, bbs=clear 

## 2013-03-15 ENCOUNTER — Telehealth: Payer: Self-pay | Admitting: *Deleted

## 2013-03-15 NOTE — Telephone Encounter (Signed)
Left message that we called for f/u 

## 2013-04-20 ENCOUNTER — Encounter: Payer: Self-pay | Admitting: Radiation Oncology

## 2013-04-25 NOTE — Progress Notes (Signed)
  Radiation Oncology         [phone] ________________________________  Name: EULISES KIJOWSKI MRN: [id number]  Date: 04/20/2013  DOB: 1950/04/04  3-D Planning Note Prostate Brachytherapy  Diagnosis: 63 y.o. gentleman with stage T1c adenocarcinoma of the prostate with a Gleason's score of 4+3 and a PSA of 3.25  Narrative: On a previous date, HAYDEN KIHARA returned following prostate seed implantation for post implant planning. He underwent CT scan complex simulation to delineate the three-dimensional structures of the pelvis and demonstrate the radiation distribution.  Since that time, the seed localization, and 3D planning with dose volume histograms have now been completed.  Results:   Prostate Coverage - The dose of radiation delivered to the 90% or more of the prostate gland (D90) was 91.29% of the prescription dose. This exceeds our goal of greater than 90%. Rectal Sparing - The volume of rectal tissue receiving the prescription dose or higher was 0.00 cc. This falls under our thresholds tolerance of 1.0 cc.  Impression: The prostate seed implant appears to show adequate target coverage and appropriate rectal sparing.  Plan:  The patient will continue to follow with urology for ongoing PSA determinations. I would anticipate a high likelihood for local tumor control with minimal risk for rectal morbidity.  ________________________________  Sheral Apley Tammi Klippel, M.D.

## 2013-08-09 ENCOUNTER — Encounter: Payer: Self-pay | Admitting: *Deleted

## 2014-10-09 ENCOUNTER — Encounter: Payer: Self-pay | Admitting: Internal Medicine

## 2014-12-13 ENCOUNTER — Ambulatory Visit (INDEPENDENT_AMBULATORY_CARE_PROVIDER_SITE_OTHER): Payer: PPO | Admitting: Internal Medicine

## 2014-12-13 ENCOUNTER — Encounter: Payer: Self-pay | Admitting: Internal Medicine

## 2014-12-13 VITALS — BP 108/64 | HR 70 | Ht 69.0 in | Wt 199.0 lb

## 2014-12-13 DIAGNOSIS — Z86010 Personal history of colonic polyps: Secondary | ICD-10-CM | POA: Diagnosis not present

## 2014-12-13 DIAGNOSIS — K219 Gastro-esophageal reflux disease without esophagitis: Secondary | ICD-10-CM | POA: Diagnosis not present

## 2014-12-13 DIAGNOSIS — R1310 Dysphagia, unspecified: Secondary | ICD-10-CM

## 2014-12-13 MED ORDER — PANTOPRAZOLE SODIUM 40 MG PO TBEC: 40.0000 mg | DELAYED_RELEASE_TABLET | Freq: Every day | ORAL | Status: DC

## 2014-12-13 NOTE — Patient Instructions (Signed)

## 2014-12-13 NOTE — Progress Notes (Signed)
HISTORY OF PRESENT ILLNESS:  Eduardo Fuentes is a 64 y.o. male with a history of depression and anxiety disorder who has been seen in this office for functional upper GI complaints and a history of adenomatous colon polyps. Patient presents today with a new chief complaint of reflux disease and intermittent solid food dysphagia over the past 3-6 months. There has been mild (several pounds) weight loss. He is also noticed hiccups. Symptoms are exacerbated by certain food items. He has not tried antacids. There has been some belching. GI review of systems is otherwise negative. His last colonoscopy January 2015 was negative except for moderate diverticulosis. Follow-up in 10 years recommended. He did undergo upper endoscopy to evaluate functional GI complaints March 2013. This was normal. He is accompanied today by his wife  REVIEW OF SYSTEMS:  All non-GI ROS negative except for shortness of breath  Past Medical History  Diagnosis Date  . Diverticulosis   . Tinnitus     LEFT EAR -- CHRONIC  . High blood pressure   . Microscopic hematuria   . Prostate cancer ([place])   . Diverticulosis of colon   . History of colon polyps   . Generalized anxiety disorder   . Panic attacks   . Depressive disorder     follow by dr [facility]  . History of kidney stones   . Sinus infection   . Cough     NON-PRODUCTIVE  . GERD (gastroesophageal reflux disease)     WATCHES DIET  . Nocturia   . Hemorrhoid   . Arthritis     NECK    Past Surgical History  Procedure Laterality Date  . Prostate biopsy    . Extracorporeal shock wave lithotripsy  YRS AGO  . Tonsillectomy and adenoidectomy  AS CHILD  . Radioactive seed implant N/A 02/02/2013    Procedure: RADIOACTIVE SEED IMPLANT;  Surgeon: Molli Hazard, MD;  Location: [facility];  Service: Urology;  Laterality: N/A;  . Colonoscopy      Social History DAWSON ALBERS  reports that he has never smoked. He has never used  smokeless tobacco. He reports that he does not drink alcohol or use illicit drugs.  family history includes Alzheimer's disease in his mother; Anxiety disorder in his mother and sister; Bone cancer in his father; Cancer in his father; Dementia in his mother; Heart disease in his father. There is no history of Colon cancer, Esophageal cancer, Rectal cancer, or Stomach cancer.  Allergies  Allergen Reactions  . Aleve [Naproxen Sodium] Other (See Comments)    GI UPSET  . Demerol [Meperidine]     Rash   . Hyzaar [Losartan Potassium-Hctz]     Felt bad   . Lisinopril     Cough        PHYSICAL EXAMINATION: Vital signs: BP 108/64 mmHg  Pulse 70  Ht 5\' 9"  (1.753 m)  Wt 199 lb (90.266 kg)  BMI 29.37 kg/m2  Constitutional: generally well-appearing, no acute distress Psychiatric: alert and oriented x3, cooperative Eyes: extraocular movements intact, anicteric, conjunctiva pink Mouth: oral pharynx moist, no lesions Neck: supple without thyromegaly Lymph: no supraclavicular lymphadenopathy Cardiovascular: heart regular rate and rhythm, no murmur Lungs: clear to auscultation bilaterally Abdomen: soft, nontender, nondistended, no obvious ascites, no peritoneal signs, normal bowel sounds, no organomegaly Rectal: Omitted Extremities: no clubbing cyanosis or lower extremity edema bilaterally Skin: no lesions on visible extremities Neuro: No focal deficits.   ASSESSMENT:  #1. New onset reflux symptoms #2.  New onset intermittent solid food dysphagia #3. History of adenomatous colon polyps. Last examination January 2015 negative for neoplasia. Diverticulosis present  PLAN:  #1. Reflux precautions. Reviewed #2. Prescribe pantoprazole 40 mg daily. Advised to take in the morning 30 minutes prior to first meal #3. Schedule upper endoscopy with possible esophageal dilation.The nature of the procedure, as well as the risks, benefits, and alternatives were carefully and thoroughly reviewed with  the patient. Ample time for discussion and questions allowed. The patient understood, was satisfied, and agreed to proceed. #4. Surveillance colonoscopy around January 2025

## 2015-02-02 ENCOUNTER — Encounter: Payer: Self-pay | Admitting: Internal Medicine

## 2015-02-02 ENCOUNTER — Ambulatory Visit (AMBULATORY_SURGERY_CENTER): Payer: PPO | Admitting: Internal Medicine

## 2015-02-02 VITALS — BP 124/79 | HR 70 | Temp 96.9°F | Resp 22 | Ht 69.0 in | Wt 199.0 lb

## 2015-02-02 DIAGNOSIS — K219 Gastro-esophageal reflux disease without esophagitis: Secondary | ICD-10-CM | POA: Diagnosis not present

## 2015-02-02 DIAGNOSIS — K222 Esophageal obstruction: Secondary | ICD-10-CM

## 2015-02-02 DIAGNOSIS — R1310 Dysphagia, unspecified: Secondary | ICD-10-CM | POA: Diagnosis not present

## 2015-02-02 MED ORDER — SODIUM CHLORIDE 0.9 % IV SOLN: 500.0000 mL | INTRAVENOUS | Status: DC

## 2015-02-02 NOTE — Patient Instructions (Addendum)
YOU HAD AN ENDOSCOPIC PROCEDURE TODAY AT [facility]:   Refer to the procedure report that was given to you for any specific questions about what was found during the examination.  If the procedure report does not answer your questions, please call your gastroenterologist to clarify.  If you requested that your care partner not be given the details of your procedure findings, then the procedure report has been included in a sealed envelope for you to review at your convenience later.  YOU SHOULD EXPECT: Some feelings of bloating in the abdomen. Passage of more gas than usual.  Walking can help get rid of the air that was put into your GI tract during the procedure and reduce the bloating. If you had a lower endoscopy (such as a colonoscopy or flexible sigmoidoscopy) you may notice spotting of blood in your stool or on the toilet paper. If you underwent a bowel prep for your procedure, you may not have a normal bowel movement for a few days.  Please Note:  You might notice some irritation and congestion in your nose or some drainage.  This is from the oxygen used during your procedure.  There is no need for concern and it should clear up in a day or so.  SYMPTOMS TO REPORT IMMEDIATELY:     Following upper endoscopy (EGD)  Vomiting of blood or coffee ground material  New chest pain or pain under the shoulder blades  Painful or persistently difficult swallowing  New shortness of breath  Fever of 100F or higher  Black, tarry-looking stools  For urgent or emergent issues, a gastroenterologist can be reached at any hour by calling [phone].   DIET: FOLLOW DILATATION DIET TODAY  ACTIVITY:  You should plan to take it easy for the rest of today and you should NOT DRIVE or use heavy machinery until tomorrow (because of the sedation medicines used during the test).    FOLLOW UP: Our staff will call the number listed on your records the next business day following your  procedure to check on you and address any questions or concerns that you may have regarding the information given to you following your procedure. If we do not reach you, we will leave a message.  However, if you are feeling well and you are not experiencing any problems, there is no need to return our call.  We will assume that you have returned to your regular daily activities without incident.  If any biopsies were taken you will be contacted by phone or by letter within the next 1-3 weeks.  Please call us at [phone] if you have not heard about the biopsies in 3 weeks.    SIGNATURES/CONFIDENTIALITY: You and/or your care partner have signed paperwork which will be entered into your electronic medical record.  These signatures attest to the fact that that the information above on your After Visit Summary has been reviewed and is understood.  Full responsibility of the confidentiality of this discharge information lies with you and/or your care-partner.   Information on reflux given to you today  Continue reflux medication  Follow dilatation diet today  MAKE APPOINTMENT TO SEE DR PERRY BACK IN OFFICE IN 3 MONTHS

## 2015-02-02 NOTE — Op Note (Signed)
[place]  [address]. [place], [address]   ENDOSCOPY PROCEDURE REPORT  PATIENT: Eduardo, Fuentes  MR#: [id number] BIRTHDATE: Mar 07, 1950 , 56  yrs. old GENDER: male ENDOSCOPIST: Eustace Quail, MD REFERRED BY:  .  Self / Office PROCEDURE DATE:  02/02/2015 PROCEDURE:  EGD, diagnostic and Maloney dilation of esophagus   -19f ASA CLASS:     Class II INDICATIONS:  dysphagia and history of esophageal reflux. MEDICATIONS: Monitored anesthesia care and Propofol 150 mg IV TOPICAL ANESTHETIC: none  DESCRIPTION OF PROCEDURE: After the risks benefits and alternatives of the procedure were thoroughly explained, informed consent was obtained.  The LB [id number] endoscope was introduced through the mouth and advanced to the second portion of the duodenum , Without limitations.  The instrument was slowly withdrawn as the mucosa was fully examined.    Esophagus revealed a benign ringlike stricture measuring approximate 15 mm.  This was located at the gastroesophageal junction (42 cm). No active inflammation or Barrett's esophagus.  Stomach was normal. The duodenum was normal.  Retroflexed views revealed a hiatal hernia.     The scope was then withdrawn from the patient and the procedure completed. THERAPY: 54 French Maloney dilator was passed into the esophagus without resistance or heme. Tolerated well  COMPLICATIONS: There were no immediate complications.  ENDOSCOPIC IMPRESSION: 1. GERD 2. Esophageal stricture status post dilation. Otherwise normal EGD   RECOMMENDATIONS: 1.  Clear liquids until 12 noon , then soft foods rest of day. Resume prior diet tomorrow. 2.  Anti-reflux regimen to be followed 3.  Continue PPI  (Protonix) 4. Office follow-up appointment with Dr. Henrene Pastor in about 3 months  REPEAT EXAM:  eSigned:  Eustace Quail, MD 02/02/2015 10:30 AM    CC:The Patient  ; Lujean Amel, MD

## 2015-02-02 NOTE — Progress Notes (Signed)
Report to PACU, RN, vss, BBS= Clear.  

## 2015-02-02 NOTE — Progress Notes (Signed)
Called to room to assist during endoscopic procedure.  Patient ID and intended procedure confirmed with present staff. Received instructions for my participation in the procedure from the performing physician.  

## 2015-02-05 ENCOUNTER — Telehealth: Payer: Self-pay

## 2015-02-05 NOTE — Telephone Encounter (Signed)
  Follow up Call-  Call back number 02/02/2015 03/14/2013  Post procedure Call Back phone  # [phone]  Permission to leave phone message Yes Yes     Patient questions:  Do you have a fever, pain , or abdominal swelling? No. Pain Score  0 *  Have you tolerated food without any problems? Yes.    Have you been able to return to your normal activities? Yes.    Do you have any questions about your discharge instructions: Diet   No. Medications  No. Follow up visit  No.  Do you have questions or concerns about your Care? No.  Actions: * If pain score is 4 or above: No action needed, pain <4.

## 2015-04-02 ENCOUNTER — Encounter: Payer: Self-pay | Admitting: Internal Medicine

## 2015-05-04 DIAGNOSIS — D225 Melanocytic nevi of trunk: Secondary | ICD-10-CM | POA: Diagnosis not present

## 2015-05-04 DIAGNOSIS — L821 Other seborrheic keratosis: Secondary | ICD-10-CM | POA: Diagnosis not present

## 2015-05-04 DIAGNOSIS — L814 Other melanin hyperpigmentation: Secondary | ICD-10-CM | POA: Diagnosis not present

## 2015-05-04 DIAGNOSIS — L918 Other hypertrophic disorders of the skin: Secondary | ICD-10-CM | POA: Diagnosis not present

## 2015-05-04 DIAGNOSIS — D1801 Hemangioma of skin and subcutaneous tissue: Secondary | ICD-10-CM | POA: Diagnosis not present

## 2015-06-25 DIAGNOSIS — F3342 Major depressive disorder, recurrent, in full remission: Secondary | ICD-10-CM | POA: Diagnosis not present

## 2015-06-25 DIAGNOSIS — F411 Generalized anxiety disorder: Secondary | ICD-10-CM | POA: Diagnosis not present

## 2015-07-05 DIAGNOSIS — L239 Allergic contact dermatitis, unspecified cause: Secondary | ICD-10-CM | POA: Diagnosis not present

## 2015-07-19 DIAGNOSIS — C61 Malignant neoplasm of prostate: Secondary | ICD-10-CM | POA: Diagnosis not present

## 2015-08-20 DIAGNOSIS — C61 Malignant neoplasm of prostate: Secondary | ICD-10-CM | POA: Diagnosis not present

## 2015-08-20 DIAGNOSIS — R3915 Urgency of urination: Secondary | ICD-10-CM | POA: Diagnosis not present

## 2015-10-26 DIAGNOSIS — K219 Gastro-esophageal reflux disease without esophagitis: Secondary | ICD-10-CM | POA: Diagnosis not present

## 2015-10-26 DIAGNOSIS — I10 Essential (primary) hypertension: Secondary | ICD-10-CM | POA: Diagnosis not present

## 2015-10-26 DIAGNOSIS — F411 Generalized anxiety disorder: Secondary | ICD-10-CM | POA: Diagnosis not present

## 2015-10-26 DIAGNOSIS — Z0000 Encounter for general adult medical examination without abnormal findings: Secondary | ICD-10-CM | POA: Diagnosis not present

## 2015-10-26 DIAGNOSIS — Z136 Encounter for screening for cardiovascular disorders: Secondary | ICD-10-CM | POA: Diagnosis not present

## 2015-10-26 DIAGNOSIS — Z79899 Other long term (current) drug therapy: Secondary | ICD-10-CM | POA: Diagnosis not present

## 2015-10-26 DIAGNOSIS — Z8546 Personal history of malignant neoplasm of prostate: Secondary | ICD-10-CM | POA: Diagnosis not present

## 2015-10-26 DIAGNOSIS — Z23 Encounter for immunization: Secondary | ICD-10-CM | POA: Diagnosis not present

## 2015-10-26 DIAGNOSIS — F321 Major depressive disorder, single episode, moderate: Secondary | ICD-10-CM | POA: Diagnosis not present

## 2015-12-17 DIAGNOSIS — F3342 Major depressive disorder, recurrent, in full remission: Secondary | ICD-10-CM | POA: Diagnosis not present

## 2015-12-17 DIAGNOSIS — F410 Panic disorder [episodic paroxysmal anxiety] without agoraphobia: Secondary | ICD-10-CM | POA: Diagnosis not present

## 2015-12-23 ENCOUNTER — Other Ambulatory Visit: Payer: Self-pay | Admitting: Internal Medicine

## 2015-12-27 DIAGNOSIS — J040 Acute laryngitis: Secondary | ICD-10-CM | POA: Diagnosis not present

## 2015-12-27 DIAGNOSIS — J321 Chronic frontal sinusitis: Secondary | ICD-10-CM | POA: Diagnosis not present

## 2015-12-27 DIAGNOSIS — J3081 Allergic rhinitis due to animal (cat) (dog) hair and dander: Secondary | ICD-10-CM | POA: Diagnosis not present

## 2015-12-27 DIAGNOSIS — J019 Acute sinusitis, unspecified: Secondary | ICD-10-CM | POA: Diagnosis not present

## 2015-12-27 DIAGNOSIS — J301 Allergic rhinitis due to pollen: Secondary | ICD-10-CM | POA: Diagnosis not present

## 2016-01-08 DIAGNOSIS — J322 Chronic ethmoidal sinusitis: Secondary | ICD-10-CM | POA: Diagnosis not present

## 2016-01-08 DIAGNOSIS — J320 Chronic maxillary sinusitis: Secondary | ICD-10-CM | POA: Diagnosis not present

## 2016-01-08 DIAGNOSIS — J301 Allergic rhinitis due to pollen: Secondary | ICD-10-CM | POA: Diagnosis not present

## 2016-01-08 DIAGNOSIS — J040 Acute laryngitis: Secondary | ICD-10-CM | POA: Diagnosis not present

## 2016-02-26 ENCOUNTER — Other Ambulatory Visit: Payer: Self-pay | Admitting: Internal Medicine

## 2016-03-07 ENCOUNTER — Encounter (INDEPENDENT_AMBULATORY_CARE_PROVIDER_SITE_OTHER): Payer: Self-pay

## 2016-03-07 ENCOUNTER — Encounter: Payer: Self-pay | Admitting: Internal Medicine

## 2016-03-07 ENCOUNTER — Ambulatory Visit (INDEPENDENT_AMBULATORY_CARE_PROVIDER_SITE_OTHER): Payer: PPO | Admitting: Internal Medicine

## 2016-03-07 VITALS — BP 104/70 | HR 80 | Ht 69.0 in | Wt 199.0 lb

## 2016-03-07 DIAGNOSIS — K219 Gastro-esophageal reflux disease without esophagitis: Secondary | ICD-10-CM

## 2016-03-07 MED ORDER — PANTOPRAZOLE SODIUM 40 MG PO TBEC: 40.0000 mg | DELAYED_RELEASE_TABLET | Freq: Every day | ORAL | 11 refills | Status: DC

## 2016-03-07 NOTE — Patient Instructions (Signed)
We have sent the following medications to your pharmacy for you to pick up at your convenience:  Protonix  Please follow up in one year 

## 2016-03-07 NOTE — Progress Notes (Signed)
HISTORY OF PRESENT ILLNESS:  Eduardo Fuentes is a 66 y.o. male with a history of depression, anxiety disorder, functional GI complaints, GERD, and adenomatous colon polyps. He presents today for follow-up regarding management of his chronic GERD. He is accompanied by his wife.Marland Kitchen He was last seen December 2016 when he underwent upper endoscopy for dysphagia. He was found to have a benign distal esophageal stricture which was dilated with 54 [place] dilator. Patient reports that he continues on pantoprazole 40 mg daily. No indigestion or pyrosis. No significant regurgitation. No dysphagia. He does report problems with thick phlegm in the need to clear his throat regular. Most prominent in the morning. GI review of systems is otherwise remarkable for weight gain.  REVIEW OF SYSTEMS:  All non-GI ROS negative except for anxiety, cough, sinus and allergies  Past Medical History:  Diagnosis Date  . Arthritis    NECK  . Cough    NON-PRODUCTIVE  . Depressive disorder    follow by dr Toy Care psychiatry  . Diverticulosis   . Diverticulosis of colon   . Generalized anxiety disorder   . GERD (gastroesophageal reflux disease)    WATCHES DIET  . Hemorrhoid   . High blood pressure   . History of colon polyps   . History of kidney stones   . Microscopic hematuria   . Nocturia   . Panic attacks   . Prostate cancer ([place])   . Sinus infection   . Tinnitus    LEFT EAR -- CHRONIC    Past Surgical History:  Procedure Laterality Date  . COLONOSCOPY    . EXTRACORPOREAL SHOCK WAVE LITHOTRIPSY  YRS AGO  . PROSTATE BIOPSY    . RADIOACTIVE SEED IMPLANT N/A 02/02/2013   Procedure: RADIOACTIVE SEED IMPLANT;  Surgeon: Molli Hazard, MD;  Location: [facility];  Service: Urology;  Laterality: N/A;  . TONSILLECTOMY AND ADENOIDECTOMY  AS CHILD    Social History [name]  reports that he has never smoked. He has never used smokeless tobacco. He reports that he does not drink  alcohol or use drugs.  family history includes Alzheimer's disease in his mother; Anxiety disorder in his mother and sister; Bone cancer in his father; Cancer in his father; Dementia in his mother; Heart disease in his father.  Allergies  Allergen Reactions  . Aleve [Naproxen Sodium] Other (See Comments)    GI UPSET  . Demerol [Meperidine]     Rash   . Hyzaar [Losartan Potassium-Hctz]     Felt bad   . Lisinopril     Cough        PHYSICAL EXAMINATION: Vital signs: BP 104/70   Pulse 80   Ht 5\' 9"  (1.753 m)   Wt 199 lb (90.3 kg)   BMI 29.39 kg/m   Constitutional: generally well-appearing, no acute distress Psychiatric: alert and oriented x3, cooperative Eyes: extraocular movements intact, anicteric, conjunctiva pink Mouth: oral pharynx moist, no lesions Neck: supple no lymphadenopathy Cardiovascular: heart regular rate and rhythm, no murmur Lungs: clear to auscultation bilaterally Abdomen: soft, nontender, nondistended, no obvious ascites, no peritoneal signs, normal bowel sounds, no organomegaly Rectal: Omitted Extremities: no clubbing cyanosis or lower extremity edema bilaterally Skin: no lesions on visible extremities Neuro: No focal deficits. Cranial nerves intact  ASSESSMENT:  1. GERD complicated by peptic stricture. Asymptomatic post dilation on pantoprazole 2. Throat clearing. May be secondary to GERD or ENT 3. History of adenomas colon polyps. Last examination January 2015 negative for neoplasia  PLAN:  1. Reflux precautions. Reviewed 2. Prescribe pantoprazole 40 mg daily 3. Surveillance colonoscopy around January 2025 4. Routine office follow-up one year. Interval follow-up if needed

## 2016-03-18 DIAGNOSIS — M25561 Pain in right knee: Secondary | ICD-10-CM | POA: Diagnosis not present

## 2016-05-05 DIAGNOSIS — R51 Headache: Secondary | ICD-10-CM | POA: Diagnosis not present

## 2016-05-05 DIAGNOSIS — R0789 Other chest pain: Secondary | ICD-10-CM | POA: Diagnosis not present

## 2016-06-30 DIAGNOSIS — J0100 Acute maxillary sinusitis, unspecified: Secondary | ICD-10-CM | POA: Diagnosis not present

## 2016-07-09 DIAGNOSIS — R21 Rash and other nonspecific skin eruption: Secondary | ICD-10-CM | POA: Diagnosis not present

## 2016-08-06 DIAGNOSIS — L814 Other melanin hyperpigmentation: Secondary | ICD-10-CM | POA: Diagnosis not present

## 2016-08-06 DIAGNOSIS — L918 Other hypertrophic disorders of the skin: Secondary | ICD-10-CM | POA: Diagnosis not present

## 2016-08-06 DIAGNOSIS — L821 Other seborrheic keratosis: Secondary | ICD-10-CM | POA: Diagnosis not present

## 2016-08-06 DIAGNOSIS — D1801 Hemangioma of skin and subcutaneous tissue: Secondary | ICD-10-CM | POA: Diagnosis not present

## 2016-08-06 DIAGNOSIS — B078 Other viral warts: Secondary | ICD-10-CM | POA: Diagnosis not present

## 2016-08-06 DIAGNOSIS — D225 Melanocytic nevi of trunk: Secondary | ICD-10-CM | POA: Diagnosis not present

## 2016-08-18 DIAGNOSIS — C61 Malignant neoplasm of prostate: Secondary | ICD-10-CM | POA: Diagnosis not present

## 2016-08-25 DIAGNOSIS — C61 Malignant neoplasm of prostate: Secondary | ICD-10-CM | POA: Diagnosis not present

## 2016-08-25 DIAGNOSIS — N4340 Spermatocele of epididymis, unspecified: Secondary | ICD-10-CM | POA: Diagnosis not present

## 2016-08-25 DIAGNOSIS — N401 Enlarged prostate with lower urinary tract symptoms: Secondary | ICD-10-CM | POA: Diagnosis not present

## 2016-08-25 DIAGNOSIS — R3915 Urgency of urination: Secondary | ICD-10-CM | POA: Diagnosis not present

## 2016-10-29 DIAGNOSIS — K219 Gastro-esophageal reflux disease without esophagitis: Secondary | ICD-10-CM | POA: Diagnosis not present

## 2016-10-29 DIAGNOSIS — Z23 Encounter for immunization: Secondary | ICD-10-CM | POA: Diagnosis not present

## 2016-10-29 DIAGNOSIS — Z79899 Other long term (current) drug therapy: Secondary | ICD-10-CM | POA: Diagnosis not present

## 2016-10-29 DIAGNOSIS — Z0001 Encounter for general adult medical examination with abnormal findings: Secondary | ICD-10-CM | POA: Diagnosis not present

## 2016-10-29 DIAGNOSIS — E7800 Pure hypercholesterolemia, unspecified: Secondary | ICD-10-CM | POA: Diagnosis not present

## 2016-10-29 DIAGNOSIS — F321 Major depressive disorder, single episode, moderate: Secondary | ICD-10-CM | POA: Diagnosis not present

## 2016-10-29 DIAGNOSIS — I10 Essential (primary) hypertension: Secondary | ICD-10-CM | POA: Diagnosis not present

## 2016-10-29 DIAGNOSIS — Z8546 Personal history of malignant neoplasm of prostate: Secondary | ICD-10-CM | POA: Diagnosis not present

## 2016-12-16 DIAGNOSIS — N289 Disorder of kidney and ureter, unspecified: Secondary | ICD-10-CM | POA: Diagnosis not present

## 2017-02-24 HISTORY — PX: UPPER GASTROINTESTINAL ENDOSCOPY: SHX188

## 2017-03-03 DIAGNOSIS — R490 Dysphonia: Secondary | ICD-10-CM | POA: Diagnosis not present

## 2017-03-03 DIAGNOSIS — J322 Chronic ethmoidal sinusitis: Secondary | ICD-10-CM | POA: Diagnosis not present

## 2017-03-03 DIAGNOSIS — J321 Chronic frontal sinusitis: Secondary | ICD-10-CM | POA: Diagnosis not present

## 2017-03-03 DIAGNOSIS — J320 Chronic maxillary sinusitis: Secondary | ICD-10-CM | POA: Diagnosis not present

## 2017-03-24 ENCOUNTER — Other Ambulatory Visit: Payer: Self-pay | Admitting: Internal Medicine

## 2017-04-08 DIAGNOSIS — J320 Chronic maxillary sinusitis: Secondary | ICD-10-CM | POA: Diagnosis not present

## 2017-04-08 DIAGNOSIS — J342 Deviated nasal septum: Secondary | ICD-10-CM | POA: Diagnosis not present

## 2017-04-08 DIAGNOSIS — J322 Chronic ethmoidal sinusitis: Secondary | ICD-10-CM | POA: Diagnosis not present

## 2017-04-09 ENCOUNTER — Ambulatory Visit: Payer: Self-pay | Admitting: Internal Medicine

## 2017-05-19 DIAGNOSIS — K529 Noninfective gastroenteritis and colitis, unspecified: Secondary | ICD-10-CM | POA: Diagnosis not present

## 2017-05-21 ENCOUNTER — Ambulatory Visit (INDEPENDENT_AMBULATORY_CARE_PROVIDER_SITE_OTHER): Payer: PPO | Admitting: Internal Medicine

## 2017-05-21 ENCOUNTER — Encounter: Payer: Self-pay | Admitting: Internal Medicine

## 2017-05-21 VITALS — BP 120/78 | HR 82 | Ht 69.0 in | Wt 193.0 lb

## 2017-05-21 DIAGNOSIS — K219 Gastro-esophageal reflux disease without esophagitis: Secondary | ICD-10-CM

## 2017-05-21 MED ORDER — PANTOPRAZOLE SODIUM 40 MG PO TBEC: DELAYED_RELEASE_TABLET | ORAL | 6 refills | Status: DC

## 2017-05-21 NOTE — Patient Instructions (Signed)
We have sent the following medications to your pharmacy for you to pick up at your convenience:  Pantoprazole  Please follow up in one year  

## 2017-05-21 NOTE — Progress Notes (Signed)
HISTORY OF PRESENT ILLNESS:  Eduardo Fuentes is a 67 y.o. male with GERD and a history of adenomatous colon polyps who presents today for routine follow-up. He is accompanied by his wife. The patient was last seen in the office 03/07/2016 for his reflux symptoms. At that time he was on pantoprazole 40 mg daily. Since that time he has done well. No significant reflux symptoms. Throat clearing behavior improved. No recurrent dysphagia due to known peptic stricture. He is up-to-date on his colon cancer surveillance. GI review of systems is otherwise negative  REVIEW OF SYSTEMS:  All non-GI ROS negative except for anxiety, depression  Past Medical History:  Diagnosis Date  . Arthritis    NECK  . Cough    NON-PRODUCTIVE  . Depressive disorder    follow by dr Eduardo Fuentes psychiatry  . Diverticulosis   . Diverticulosis of colon   . Generalized anxiety disorder   . GERD (gastroesophageal reflux disease)    WATCHES DIET  . Hemorrhoid   . High blood pressure   . History of colon polyps   . History of kidney stones   . Microscopic hematuria   . Nocturia   . Panic attacks   . Prostate cancer ([place])   . Sinus infection   . Tinnitus    LEFT EAR -- CHRONIC    Past Surgical History:  Procedure Laterality Date  . COLONOSCOPY    . ESOPHAGOGASTRODUODENOSCOPY    . EXTRACORPOREAL SHOCK WAVE LITHOTRIPSY  YRS AGO  . PROSTATE BIOPSY    . RADIOACTIVE SEED IMPLANT N/A 02/02/2013   Procedure: RADIOACTIVE SEED IMPLANT;  Surgeon: Eduardo Hazard, MD;  Location: [facility];  Service: Urology;  Laterality: N/A;  . TONSILLECTOMY AND ADENOIDECTOMY  AS CHILD    Social History [name]  reports that he has never smoked. He has never used smokeless tobacco. He reports that he does not drink alcohol or use drugs.  family history includes Alzheimer's disease in his mother; Anxiety disorder in his mother and sister; Bone cancer in his father; Cancer in his father; Dementia in his  mother; Heart disease in his father.  Allergies  Allergen Reactions  . Aleve [Naproxen Sodium] Other (See Comments)    GI UPSET  . Demerol [Meperidine]     Rash   . Hyzaar [Losartan Potassium-Hctz]     Felt bad   . Lisinopril     Cough        PHYSICAL EXAMINATION: Vital signs: BP 120/78   Pulse 82   Ht 5\' 9"  (1.753 m)   Wt 193 lb (87.5 kg)   BMI 28.50 kg/m   Constitutional: generally well-appearing, no acute distress Psychiatric: alert and oriented x3, cooperative Eyes: extraocular movements intact, anicteric, conjunctiva pink Mouth: oral pharynx moist, no lesions Neck: supple no lymphadenopathy Cardiovascular: heart regular rate and rhythm, no murmur Lungs: clear to auscultation bilaterally Abdomen: soft, nontender, nondistended, no obvious ascites, no peritoneal signs, normal bowel sounds, no organomegaly Rectal:omitted Extremities: no clubbing cyanosis or lower extremity edema bilaterally Skin: no lesions on visible extremities Neuro: No focal deficits. Cranial nerves intact  ASSESSMENT:  #1. GERD with history of peptic stricture. Currently asymptomatic post dilation on PPI #2. History of adenomatous colon polyps. Surveillance up-to-date   PLAN:  #1. Reflux precautions #2. Refill pantoprazole 40 mg daily. We discussed chronic PPI use #3. Routine office follow-up one year #4. Surveillance colonoscopy around January 2025 based on current guidelines  15 minutes face-to-face with the patient. Greater  than 50% a time use for counseling guarding is chronic GERD and issue surrounding chronic PPI use.

## 2017-07-28 DIAGNOSIS — F332 Major depressive disorder, recurrent severe without psychotic features: Secondary | ICD-10-CM | POA: Diagnosis not present

## 2017-08-25 DIAGNOSIS — C61 Malignant neoplasm of prostate: Secondary | ICD-10-CM | POA: Diagnosis not present

## 2017-09-01 DIAGNOSIS — R3915 Urgency of urination: Secondary | ICD-10-CM | POA: Diagnosis not present

## 2017-09-01 DIAGNOSIS — N202 Calculus of kidney with calculus of ureter: Secondary | ICD-10-CM | POA: Diagnosis not present

## 2017-09-01 DIAGNOSIS — N4340 Spermatocele of epididymis, unspecified: Secondary | ICD-10-CM | POA: Diagnosis not present

## 2017-09-01 DIAGNOSIS — C61 Malignant neoplasm of prostate: Secondary | ICD-10-CM | POA: Diagnosis not present

## 2017-09-04 DIAGNOSIS — N202 Calculus of kidney with calculus of ureter: Secondary | ICD-10-CM | POA: Diagnosis not present

## 2017-09-04 DIAGNOSIS — C61 Malignant neoplasm of prostate: Secondary | ICD-10-CM | POA: Diagnosis not present

## 2017-09-09 DIAGNOSIS — M5442 Lumbago with sciatica, left side: Secondary | ICD-10-CM | POA: Diagnosis not present

## 2017-09-09 DIAGNOSIS — R1032 Left lower quadrant pain: Secondary | ICD-10-CM | POA: Diagnosis not present

## 2017-09-14 ENCOUNTER — Emergency Department (HOSPITAL_COMMUNITY): Payer: PPO

## 2017-09-14 ENCOUNTER — Encounter (HOSPITAL_COMMUNITY): Payer: Self-pay | Admitting: Emergency Medicine

## 2017-09-14 ENCOUNTER — Emergency Department (HOSPITAL_COMMUNITY)
Admission: EM | Admit: 2017-09-14 | Discharge: 2017-09-14 | Disposition: A | Discharge status: Home / Self Care | Payer: PPO | Attending: Emergency Medicine | Admitting: Emergency Medicine

## 2017-09-14 DIAGNOSIS — N50812 Left testicular pain: Secondary | ICD-10-CM

## 2017-09-14 DIAGNOSIS — Z8546 Personal history of malignant neoplasm of prostate: Secondary | ICD-10-CM | POA: Insufficient documentation

## 2017-09-14 DIAGNOSIS — M79652 Pain in left thigh: Secondary | ICD-10-CM | POA: Diagnosis not present

## 2017-09-14 DIAGNOSIS — Z79899 Other long term (current) drug therapy: Secondary | ICD-10-CM | POA: Insufficient documentation

## 2017-09-14 DIAGNOSIS — M5416 Radiculopathy, lumbar region: Secondary | ICD-10-CM | POA: Diagnosis not present

## 2017-09-14 DIAGNOSIS — M5410 Radiculopathy, site unspecified: Secondary | ICD-10-CM

## 2017-09-14 DIAGNOSIS — N5082 Scrotal pain: Secondary | ICD-10-CM | POA: Diagnosis not present

## 2017-09-14 LAB — URINALYSIS, ROUTINE W REFLEX MICROSCOPIC
BILIRUBIN URINE: NEGATIVE
Glucose, UA: NEGATIVE mg/dL
Hgb urine dipstick: NEGATIVE
Ketones, ur: NEGATIVE mg/dL
Leukocytes, UA: NEGATIVE
NITRITE: NEGATIVE
PROTEIN: NEGATIVE mg/dL
Specific Gravity, Urine: 1.019 (ref 1.005–1.030)
pH: 5 (ref 5.0–8.0)

## 2017-09-14 MED ORDER — TRAMADOL HCL 50 MG PO TABS: 50.0000 mg | ORAL_TABLET | Freq: Four times a day (QID) | ORAL | 0 refills | Status: DC | PRN

## 2017-09-14 MED ORDER — HYDROCODONE-ACETAMINOPHEN 5-325 MG PO TABS
1.0000 | ORAL_TABLET | Freq: Once | ORAL | Status: AC
  Administered 2017-09-14: 1 via ORAL
  Filled 2017-09-14: qty 1

## 2017-09-14 MED ORDER — TRAMADOL HCL 50 MG PO TABS
50.0000 mg | ORAL_TABLET | Freq: Once | ORAL | Status: AC
  Administered 2017-09-14: 50 mg via ORAL
  Filled 2017-09-14: qty 1

## 2017-09-14 NOTE — Discharge Instructions (Addendum)
It was our pleasure to provide your ER care today - we hope that you feel better.  Today, your urine tests are normal - no infection. The ultrasound also looks good, or normal.   You may take ultram as need for pain - no driving for the next 4 hours or when taking ultram.   Follow up with primary care doctor in the next few days. Discuss possible outpatient advanced imaging (I.e. MRI) to further evaluate your symptoms.   Return to ER if worse, new symptoms, fevers, severe swelling, intractable pain, other concern.

## 2017-09-14 NOTE — ED Notes (Signed)
MD aware patient requesting more pain medication

## 2017-09-14 NOTE — ED Triage Notes (Signed)
Pt c/o lower back pain that radiates down left leg and testicle pain since last week. 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 Saw PCP and urologist last week who did xray and ruled out kidney stone.

## 2017-09-14 NOTE — ED Provider Notes (Signed)
[facility] DEPT Provider Note   CSN: [id number] Arrival date & time: 09/14/17  4034     History   Chief Complaint Chief Complaint  Patient presents with  . Back Pain  . Testicle Pain    HPI Eduardo Fuentes is a 67 y.o. male.  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 Symptoms moderate, persistent, constant x 1 week. No specific exacerbating or alleviating factors. Pt reports going to doctor and having scan done and was told no kidney stone. States pain also involved left testicle area, and was prescribed antibiotic for epididymitis, but no change in symptoms. No dysuria or hematuria. No fever or chills. Pain occasionally radiates to anterior left leg. Occasionally tingling sensation to left lower extremity. No weakness. No problems w balance or gait.   The history is provided by the patient.  Back Pain   Pertinent negatives include no chest pain, no fever, no numbness, no headaches, no abdominal pain, no dysuria and no weakness.  Testicle Pain  Pertinent negatives include no chest pain, no abdominal pain, no headaches and no shortness of breath.    Past Medical History:  Diagnosis Date  . Arthritis    NECK  . Cough    NON-PRODUCTIVE  . Depressive disorder    follow by dr Toy Care psychiatry  . Diverticulosis   . Diverticulosis of colon   . Generalized anxiety disorder   . GERD (gastroesophageal reflux disease)    WATCHES DIET  . Hemorrhoid   . High blood pressure   . History of colon polyps   . History of kidney stones   . Microscopic hematuria   . Nocturia   . Panic attacks   . Prostate cancer ([place])   . Sinus infection   . Tinnitus    LEFT EAR -- CHRONIC    Patient Active Problem List   Diagnosis Date Noted  . Prostate CA ([place]) 09/03/2012  . 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 Generalized anxiety disorder 10/29/2011  . Panic  disorder with agoraphobia 10/29/2011  . Nausea 05/09/2011  . Heartburn 05/09/2011  . Dizziness 05/09/2011    Past Surgical History:  Procedure Laterality Date  . COLONOSCOPY    . ESOPHAGOGASTRODUODENOSCOPY    . EXTRACORPOREAL SHOCK WAVE LITHOTRIPSY  YRS AGO  . PROSTATE BIOPSY    . RADIOACTIVE SEED IMPLANT N/A 02/02/2013   Procedure: RADIOACTIVE SEED IMPLANT;  Surgeon: Molli Hazard, MD;  Location[facility];  Service: Urology;  Laterality: N/A;  . TONSILLECTOMY AND ADENOIDECTOMY  AS CHILD        Home Medications    Prior to Admission medications   Medication Sig Start Date End Date Taking? Authorizing Provider  clonazePAM (KLONOPIN) 1 MG tablet Take 1 tablet at 8 am, 1/2 tablet at 2 pm and 1 tablet at bedtime for anxiety. 11/05/11   Readling, Milana Huntsman, MD  FLUoxetine (PROZAC) 20 MG capsule Take 20 mg by mouth 2 (two) times daily.     [provider]  hydrochlorothiazide (HYDRODIURIL) 12.5 MG tablet Take 12.5 mg by mouth daily.    Readling, Milana Huntsman, MD  OLANZapine (ZYPREXA) 5 MG tablet Take 1 tablet by mouth at bedtime. 03/09/17   [provider]  pantoprazole (PROTONIX) 40 MG tablet TAKE 1 TABLET(40 MG) BY MOUTH DAILY 05/21/17   Irene Shipper, MD    Family History Family History  Problem Relation Age  of Onset  . Dementia Mother   . Anxiety disorder Mother   . Alzheimer's disease Mother   . Heart disease Father   . Bone cancer Father   . Cancer Father        prostate  . Anxiety disorder Sister   . Colon cancer Neg Hx   . Esophageal cancer Neg Hx   . Rectal cancer Neg Hx   . Stomach cancer Neg Hx     Social History Social History   Tobacco Use  . Smoking status: Never Smoker  . Smokeless tobacco: Never Used  Substance Use Topics  . Alcohol use: No  . Drug use: No     Allergies   Aleve [naproxen sodium]; Demerol [meperidine]; Hyzaar [losartan potassium-hctz]; and Lisinopril   Review of Systems Review of Systems    Constitutional: Negative for fever.  HENT: Negative for sore throat.   Eyes: Negative for redness.  Respiratory: Negative for shortness of breath.   Cardiovascular: Negative for chest pain.  Gastrointestinal: Negative for abdominal pain and vomiting.  Genitourinary: Positive for testicular pain. Negative for dysuria and flank pain.  Musculoskeletal: Positive for back pain. Negative for neck pain.  Skin: Negative for rash.  Neurological: Negative for weakness, numbness and headaches.  Hematological: Does not bruise/bleed easily.  Psychiatric/Behavioral: Negative for confusion.     Physical Exam Updated Vital Signs BP (!) 155/105 (BP Location: Right Arm)   Pulse 100   Temp 97.7 F (36.5 C) (Oral)   Resp 18   SpO2 94%   Physical Exam  Constitutional: He appears well-developed and well-nourished.  HENT:  Mouth/Throat: Oropharynx is clear and moist.  Eyes: Conjunctivae are normal.  Neck: Neck supple. No tracheal deviation present.  Cardiovascular: Normal rate, regular rhythm, normal heart sounds and intact distal pulses. Exam reveals no gallop and no friction rub.  No murmur heard. Pulmonary/Chest: Effort normal and breath sounds normal. No accessory muscle usage. No respiratory distress.  Abdominal: Soft. Bowel sounds are normal. He exhibits no distension and no mass. There is no tenderness. There is no guarding.  Genitourinary:  Genitourinary Comments: Normal external gu exam. No swelling. No incarc hernia. No abscess.   Musculoskeletal: He exhibits no edema.  TLS spine non tender, aligned. Good rom left hip without pain. Left fem and distal pulses palp. No leg swelling or skin changes.   Neurological: He is alert.  Lower ext stre 5/5. sens grossly intact.   Skin: Skin is warm and dry. No rash noted.  Psychiatric: He has a normal mood and affect.  Nursing note and vitals reviewed.    ED Treatments / Results  Labs (all labs ordered are listed, but only abnormal results  are displayed) Results for orders placed or performed during the hospital encounter of 09/14/17  Urinalysis, Routine w reflex microscopic  Result Value Ref Range   Color, Urine YELLOW YELLOW   APPearance CLEAR CLEAR   Specific Gravity, Urine 1.019 1.005 - 1.030   pH 5.0 5.0 - 8.0   Glucose, UA NEGATIVE NEGATIVE mg/dL   Hgb urine dipstick NEGATIVE NEGATIVE   Bilirubin Urine NEGATIVE NEGATIVE   Ketones, ur NEGATIVE NEGATIVE mg/dL   Protein, ur NEGATIVE NEGATIVE mg/dL   Nitrite NEGATIVE NEGATIVE   Leukocytes, UA NEGATIVE NEGATIVE   EKG None  Radiology US Scrotum W/doppler  Result Date: 09/14/2017 CLINICAL DATA:  Left scrotal pain for 2 weeks. Evaluate for mass or torsion EXAM: SCROTAL ULTRASOUND DOPPLER ULTRASOUND OF THE TESTICLES TECHNIQUE: Complete ultrasound examination of the  testicles, epididymis, and other scrotal structures was performed. Color and spectral Doppler ultrasound were also utilized to evaluate blood flow to the testicles. COMPARISON:  None. FINDINGS: Right testicle Measurements: 33 x 17 x 23 mm. No mass or microlithiasis visualized. Left testicle Measurements: 29 x 15 x 26 mm. No mass or microlithiasis visualized. Right epididymis:  Normal in size and appearance. Left epididymis:  Normal in size and appearance. Hydrocele:  None visualized. Varicocele:  None visualized. Pulsed Doppler interrogation of both testes demonstrates normal low resistance arterial and venous waveforms bilaterally. IMPRESSION: Normal testicular ultrasound with Doppler. Electronically Signed   By: Monte Fantasia M.D.   On: 09/14/2017 13:28    Procedures Procedures (including critical care time)  Medications Ordered in ED Medications - No data to display   Initial Impression / Assessment and Plan / ED Course  I have reviewed the triage vital signs and the nursing notes.  Pertinent labs & imaging results that were available during my care of the patient were reviewed by me and considered in my  medical decision making (see chart for details).  Labs.  Reviewed nursing notes and prior charts for additional history.   Labs reviewed - ua normal, and normal gu exam today. Pt reports neg ct renal by his urologist recently for same symptoms.   Discussed differential dx including possible nerve impingement, radicular symptoms. Of note - pt denies any change w current steroid taper. Discussed outpatient advanced imaging/mr to further evaluate symptoms.   There is no abd or flank pain. No abd tenderness or mass. Spine non tender.   Pt currently appears stable for d/c.   Return precautions provided.     Final Clinical Impressions(s) / ED Diagnoses   Final diagnoses:  None    ED Discharge Orders    None       Lajean Saver, MD 09/14/17 1338

## 2017-09-16 DIAGNOSIS — M5432 Sciatica, left side: Secondary | ICD-10-CM | POA: Diagnosis not present

## 2017-09-16 DIAGNOSIS — M5431 Sciatica, right side: Secondary | ICD-10-CM | POA: Diagnosis not present

## 2017-09-22 ENCOUNTER — Other Ambulatory Visit: Payer: Self-pay | Admitting: Family Medicine

## 2017-09-22 DIAGNOSIS — M5431 Sciatica, right side: Secondary | ICD-10-CM

## 2017-09-22 DIAGNOSIS — M5432 Sciatica, left side: Secondary | ICD-10-CM

## 2017-09-25 ENCOUNTER — Ambulatory Visit
Admission: RE | Admit: 2017-09-25 | Discharge: 2017-09-25 | Disposition: A | Discharge status: Home / Self Care | Payer: PPO | Source: Ambulatory Visit | Attending: Family Medicine | Admitting: Family Medicine

## 2017-09-25 DIAGNOSIS — M5432 Sciatica, left side: Secondary | ICD-10-CM

## 2017-09-25 DIAGNOSIS — M48061 Spinal stenosis, lumbar region without neurogenic claudication: Secondary | ICD-10-CM | POA: Diagnosis not present

## 2017-09-25 DIAGNOSIS — M5431 Sciatica, right side: Secondary | ICD-10-CM

## 2017-10-02 DIAGNOSIS — R413 Other amnesia: Secondary | ICD-10-CM | POA: Diagnosis not present

## 2017-10-02 DIAGNOSIS — R5383 Other fatigue: Secondary | ICD-10-CM | POA: Diagnosis not present

## 2017-10-05 ENCOUNTER — Encounter: Payer: Self-pay | Admitting: Neurology

## 2017-10-21 DIAGNOSIS — M5416 Radiculopathy, lumbar region: Secondary | ICD-10-CM | POA: Diagnosis not present

## 2017-10-21 DIAGNOSIS — M5136 Other intervertebral disc degeneration, lumbar region: Secondary | ICD-10-CM | POA: Diagnosis not present

## 2017-11-09 DIAGNOSIS — Z131 Encounter for screening for diabetes mellitus: Secondary | ICD-10-CM | POA: Diagnosis not present

## 2017-11-09 DIAGNOSIS — I10 Essential (primary) hypertension: Secondary | ICD-10-CM | POA: Diagnosis not present

## 2017-11-09 DIAGNOSIS — E7800 Pure hypercholesterolemia, unspecified: Secondary | ICD-10-CM | POA: Diagnosis not present

## 2017-11-09 DIAGNOSIS — Z23 Encounter for immunization: Secondary | ICD-10-CM | POA: Diagnosis not present

## 2017-11-09 DIAGNOSIS — Z0001 Encounter for general adult medical examination with abnormal findings: Secondary | ICD-10-CM | POA: Diagnosis not present

## 2017-11-09 DIAGNOSIS — Z79899 Other long term (current) drug therapy: Secondary | ICD-10-CM | POA: Diagnosis not present

## 2017-11-09 DIAGNOSIS — F321 Major depressive disorder, single episode, moderate: Secondary | ICD-10-CM | POA: Diagnosis not present

## 2017-11-09 DIAGNOSIS — R413 Other amnesia: Secondary | ICD-10-CM | POA: Diagnosis not present

## 2017-11-10 DIAGNOSIS — M5416 Radiculopathy, lumbar region: Secondary | ICD-10-CM | POA: Diagnosis not present

## 2017-11-26 ENCOUNTER — Other Ambulatory Visit (INDEPENDENT_AMBULATORY_CARE_PROVIDER_SITE_OTHER): Payer: PPO

## 2017-11-26 ENCOUNTER — Encounter: Payer: Self-pay | Admitting: Neurology

## 2017-11-26 ENCOUNTER — Ambulatory Visit (INDEPENDENT_AMBULATORY_CARE_PROVIDER_SITE_OTHER): Payer: PPO | Admitting: Neurology

## 2017-11-26 ENCOUNTER — Other Ambulatory Visit: Payer: Self-pay

## 2017-11-26 VITALS — BP 136/84 | HR 108 | Ht 69.0 in | Wt 189.0 lb

## 2017-11-26 DIAGNOSIS — F321 Major depressive disorder, single episode, moderate: Secondary | ICD-10-CM | POA: Diagnosis not present

## 2017-11-26 DIAGNOSIS — G3184 Mild cognitive impairment, so stated: Secondary | ICD-10-CM

## 2017-11-26 DIAGNOSIS — R5383 Other fatigue: Secondary | ICD-10-CM | POA: Diagnosis not present

## 2017-11-26 DIAGNOSIS — R413 Other amnesia: Secondary | ICD-10-CM

## 2017-11-26 NOTE — Patient Instructions (Addendum)
1. Bloodwork for vitamin B12, vitamin D levels  Your provider requests that you have LABS drawn today.  We share a lab with [facility] - they are located in [address] (second floor) of this building.  Once you get there, please have a seat and the phlebotomist will call your name.  If you have waited more than 15 minutes, please advise the front desk.  2. Schedule MRI brain without contrast  We have sent a referral to [facility] for your MRI and they will call you directly to schedule your appt. They are located at [place]. If you need to contact them directly please call [phone].   3. Continue working with Dr. Toy Care on the depression, recommend seeing a counselor regularly as well 4. Follow-up in 6 months or so, call for any changes   RECOMMENDATIONS FOR ALL PATIENTS WITH MEMORY PROBLEMS: 1. Continue to exercise (Recommend 30 minutes of walking everyday, or 3 hours every week) 2. Increase social interactions - continue going to [place] and enjoy social gatherings with friends and family 3. Eat healthy, avoid fried foods and eat more fruits and vegetables 4. Maintain adequate blood pressure, blood sugar, and blood cholesterol level. Reducing the risk of stroke and cardiovascular disease also helps promoting better memory. 5. Avoid stressful situations. Live a simple life and avoid aggravations. Organize your time and prepare for the next day in anticipation. 6. Sleep well, avoid any interruptions of sleep and avoid any distractions in the bedroom that may interfere with adequate sleep quality 7. Avoid sugar, avoid sweets as there is a strong link between excessive sugar intake, diabetes, and cognitive impairment We discussed the Mediterranean diet, which has been shown to help patients reduce the risk of progressive memory disorders and reduces cardiovascular risk. This includes eating fish, eat fruits and green leafy vegetables, nuts like almonds and hazelnuts,  walnuts, and also use olive oil. Avoid fast foods and fried foods as much as possible. Avoid sweets and sugar as sugar use has been linked to worsening of memory function.

## 2017-11-26 NOTE — Progress Notes (Signed)
NEUROLOGY CONSULTATION NOTE  Eduardo Fuentes MRN: [id number] DOB: 1950-06-27  Referring provider: Dr. Lujean Amel Primary care provider: Dr. Lujean Amel  Reason for consult:  Memory loss  Dear Dr Dorthy Cooler:  Thank you for your kind referral of Eduardo Fuentes for consultation of the above symptoms. Although his history is well known to you, please allow me to reiterate it for the purpose of our medical record. The patient was accompanied to the clinic by his wife who also provides collateral information. Records and images were personally reviewed where available.  HISTORY OF PRESENT ILLNESS: This is a 67 year old right-handed man with a history of depression, anxiety, prostate cancer, presenting for evaluation of worsening memory. He feels his memory is pretty good, he mostly has short-term memory changes. His wife shakes her head, she reports memory changes over the past 1-2 years, worse in the past 2 months. He frequently misplaces things at home. He does not recall dates. He denies any word-finding difficulties. He denies getting lost driving. He denies missing bill payments and usually does not forget his medications. His wife is concerned he is not taking the right medications because of many medication changes. She reports he was fine until January 2019 when medication changes were made with Dr. Toy Care. She feels his mood changed, he sleeps a lot, he has become less talkative ("does not have any conversations"), he does not want to go anywhere. He used to go to church regularly, but stopped suddenly in May/June. He does not play with the dog as much. He mostly watches TV, then lays in bed if there is nothing good on TV. Sleep is good. He reports his energy level is really low. He used to do their neighbor's lawns, but now does not even do their own lawn. He has told his wife he feels like he will die in a couple of years, no suicidal/homicidal ideation. He is a little more irritable, no  paranoia or hallucinations. He had an MMSE with at his PCP office last August, 25/30.  He has occasional occipital headaches due to neck pain. He has some back pain, occasional dizziness. He has occasional tingling in the 3rd and 4th digits of his right hand. He has noticed slight tremors in both hands affecting his writing. No diplopia, dysarthria/dysphagia, bowel/bladder dysfunction, anosmia. No falls. His mother had memory issues. He denies any history of significant head injuries, no alcohol use. 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  Laboratory Data: TSH normal, B12 200 (started on B12 544mcg daily)  PAST MEDICAL HISTORY: Past Medical History:  Diagnosis Date  . Arthritis    NECK  . Cough    NON-PRODUCTIVE  . Depressive disorder    follow by dr Toy Care psychiatry  . Diverticulosis   . Diverticulosis of colon   . Generalized anxiety disorder   . GERD (gastroesophageal reflux disease)    WATCHES DIET  . Hemorrhoid   . High blood pressure   . History of colon polyps   . History of kidney stones   . Microscopic hematuria   . Nocturia   . Panic attacks   . Prostate cancer ([place])   . Sinus infection   . Tinnitus    LEFT EAR -- CHRONIC    PAST SURGICAL HISTORY: Past Surgical History:  Procedure Laterality Date  . COLONOSCOPY    . ESOPHAGOGASTRODUODENOSCOPY    . EXTRACORPOREAL SHOCK WAVE LITHOTRIPSY  YRS AGO  . PROSTATE BIOPSY    .  RADIOACTIVE SEED IMPLANT N/A 02/02/2013   Procedure: RADIOACTIVE SEED IMPLANT;  Surgeon: Molli Hazard, MD;  Location: [facility];  Service: Urology;  Laterality: N/A;  . TONSILLECTOMY AND ADENOIDECTOMY  AS CHILD    MEDICATIONS: Current Outpatient Medications on File Prior to Visit  Medication Sig Dispense Refill  . clonazePAM (KLONOPIN) 1 MG tablet Take 1 tablet at 8 am, 1/2 tablet at 2 pm and 1 tablet at bedtime for anxiety. (Patient taking differently: Take 1 mg by mouth 2 (two) times daily. ) 75 tablet 0  .  FLUoxetine (PROZAC) 40 MG capsule fluoxetine 40 mg capsule    . gabapentin (NEURONTIN) 300 MG capsule gabapentin 300 mg capsule    . nortriptyline (PAMELOR) 50 MG capsule nortriptyline 50 mg capsule    . pantoprazole (PROTONIX) 40 MG tablet TAKE 1 TABLET(40 MG) BY MOUTH DAILY 30 tablet 6   No current facility-administered medications on file prior to visit.     ALLERGIES: Allergies  Allergen Reactions  . Aleve [Naproxen Sodium] Anaphylaxis and Other (See Comments)    GI UPSET Patient does not remember anaphylaxis  . Hyzaar [Losartan Potassium-Hctz] Other (See Comments)    Felt bad   . Lisinopril Cough  . Demerol [Meperidine] Rash         FAMILY HISTORY: Family History  Problem Relation Age of Onset  . Dementia Mother   . Anxiety disorder Mother   . Alzheimer's disease Mother   . Heart disease Father   . Bone cancer Father   . Cancer Father        prostate  . Anxiety disorder Sister   . Colon cancer Neg Hx   . Esophageal cancer Neg Hx   . Rectal cancer Neg Hx   . Stomach cancer Neg Hx     SOCIAL HISTORY: Social History   Socioeconomic History  . Marital status: Married    Spouse name: [name]  . Number of children: 0  . Years of education: Not on file  . Highest education level: Not on file  Occupational History  . Occupation: retired  Scientific laboratory technician  . Financial resource strain: Not on file  . Food insecurity:    Worry: Not on file    Inability: Not on file  . Transportation needs:    Medical: Not on file    Non-medical: Not on file  Tobacco Use  . Smoking status: Never Smoker  . Smokeless tobacco: Never Used  Substance and Sexual Activity  . Alcohol use: No  . Drug use: No  . Sexual activity: Never  Lifestyle  . Physical activity:    Days per week: Not on file    Minutes per session: Not on file  . Stress: Not on file  Relationships  . Social connections:    Talks on phone: Not on file    Gets together: Not on file    Attends religious service:  Not on file    Active member of club or organization: Not on file    Attends meetings of clubs or organizations: Not on file    Relationship status: Not on file  . Intimate partner violence:    Fear of current or ex partner: Not on file    Emotionally abused: Not on file    Physically abused: Not on file    Forced sexual activity: Not on file  Other Topics Concern  . Not on file  Social History Narrative   Pt lives in single story home with  is wife   12th grade education   Retired Programmer, systems from [place]: Constitutional: No fevers, chills, or sweats, no generalized fatigue, change in appetite Eyes: No visual changes, double vision, eye pain Ear, nose and throat: No hearing loss, ear pain, nasal congestion, sore throat Cardiovascular: No chest pain, palpitations Respiratory:  No shortness of breath at rest or with exertion, wheezes GastrointestinaI: No nausea, vomiting, diarrhea, abdominal pain, fecal incontinence Genitourinary:  No dysuria, urinary retention or frequency Musculoskeletal:  + neck pain, back pain Integumentary: No rash, pruritus, skin lesions Neurological: as above Psychiatric: + depression, anxiety Endocrine: No palpitations,+ fatigue,no diaphoresis, mood swings, change in appetite, change in weight, increased thirst Hematologic/Lymphatic:  No anemia, purpura, petechiae. Allergic/Immunologic: no itchy/runny eyes, nasal congestion, recent allergic reactions, rashes  PHYSICAL EXAM: Vitals:   11/26/17 1025  BP: 136/84  Pulse: (!) 108  SpO2: 95%   General: No acute distress Head:  Normocephalic/atraumatic Eyes: Fundoscopic exam shows bilateral sharp discs, no vessel changes, exudates, or hemorrhages Neck: supple, no paraspinal tenderness, full range of motion Back: No paraspinal tenderness Heart: regular rate and rhythm Lungs: Clear to auscultation bilaterally. Vascular: No carotid  bruits. Skin/Extremities: No rash, no edema Neurological Exam: Mental status: alert and oriented to person, place, and time, no dysarthria or aphasia, Fund of knowledge is appropriate.  Recent and remote memory are intact.  Attention and concentration are normal.    Able to name objects and repeat phrases.  Montreal Cognitive Assessment  11/26/2017  Visuospatial/ Executive (0/5) 5  Naming (0/3) 3  Attention: Read list of digits (0/2) 2  Attention: Read list of letters (0/1) 1  Attention: Serial 7 subtraction starting at 100 (0/3) 3  Language: Repeat phrase (0/2) 2  Language : Fluency (0/1) 0  Abstraction (0/2) 2  Delayed Recall (0/5) 4  Orientation (0/6) 6  Total 28   Cranial nerves: CN I: not tested CN II: pupils equal, round and reactive to light, visual fields intact, fundi unremarkable. CN III, IV, VI:  full range of motion, no nystagmus, no ptosis CN V: facial sensation intact CN VII: upper and lower face symmetric CN VIII: hearing intact to finger rub CN IX, X: gag intact, uvula midline CN XI: sternocleidomastoid and trapezius muscles intact CN XII: tongue midline Bulk & Tone: normal, no fasciculations, no cogwheelin Motor: 5/5 throughout with no pronator drift. Sensation: intact to light touch, cold, pin, vibration and joint position sense.  No extinction to double simultaneous stimulation.  Romberg test negative Deep Tendon Reflexes: +2 throughout, no ankle clonus Plantar responses: downgoing bilaterally Cerebellar: no incoordination on finger to nose testing Gait: narrow-based and steady, able to tandem walk adequately. Tremor: no resting tremor, +high frequency low amplitude postural and endpoint tremor bilaterally, L>R  IMPRESSION: This is a pleasant 67 year old right-handed man with a history of prostate cancer, depression, anxiety, presenting for evaluation of worsening memory. His neurological exam is non-focal, MOCA score today normal 28/30. Symptoms suggestive of  mild cognitive impairment, by history, he does not have any difficulties with complex tasks and does not meet criteria for dementia. We discussed different causes of memory loss, his B12 level was low normal, we discussed goal B12 level above 400, recheck B12 level, as well as vitamin D level as he reports fatigue as well. MRI brain without contrast will be ordered to assess for underlying structural abnormality. We discussed effects of depression/anxiety on memory, in his case, memory  issues are most likely due to underlying psychiatric condition, discussed he needs to continue working regularly with Dr. Toy Care and recommended seeing a therapist. If memory issues continue despite improvement in mood issues, we will do Neurocognitive testing. We discussed the importance of control of vascular risk factors, physical exercise, and brain stimulation exercises. Follow-up in 6 months, call for any changes.  Thank you for allowing me to participate in the care of this patient. Please do not hesitate to call for any questions or concerns.   Ellouise Newer, M.D.  CC: Dr. Dorthy Cooler, Dr. Toy Care

## 2017-11-27 ENCOUNTER — Telehealth: Payer: Self-pay | Admitting: Neurology

## 2017-11-27 NOTE — Telephone Encounter (Signed)
Spoke with pt letting him know that previous MRI was of lumbar spine, not brain/head.  Pt verbalized understanding.

## 2017-11-27 NOTE — Telephone Encounter (Signed)
Patient called in stating during his appt yesterday Dr.Aquino discussed having an MRI. He said that he just recently had one about a month ago and wanted to know if she could just review those. Please call him back at [phone].Thanks!

## 2017-11-30 ENCOUNTER — Telehealth: Payer: Self-pay

## 2017-11-30 LAB — VITAMIN B12: VITAMIN B 12: 554 pg/mL (ref 200–1100)

## 2017-11-30 LAB — VITAMIN D 1,25 DIHYDROXY
VITAMIN D3 1, 25 (OH): 55 pg/mL
Vitamin D 1, 25 (OH)2 Total: 55 pg/mL (ref 18–72)

## 2017-11-30 NOTE — Telephone Encounter (Signed)
Spoke with pt relaying message below.   

## 2017-11-30 NOTE — Telephone Encounter (Signed)
-----   Message from Cameron Sprang, MD sent at 11/30/2017  9:56 AM EDT ----- Pls let him know vitamin D and vitamin B12 levels were normal, thanks

## 2017-12-10 ENCOUNTER — Other Ambulatory Visit: Payer: Self-pay

## 2017-12-18 ENCOUNTER — Ambulatory Visit
Admission: RE | Admit: 2017-12-18 | Discharge: 2017-12-18 | Disposition: A | Discharge status: Home / Self Care | Payer: PPO | Source: Ambulatory Visit | Attending: Neurology | Admitting: Neurology

## 2017-12-18 DIAGNOSIS — R413 Other amnesia: Secondary | ICD-10-CM | POA: Diagnosis not present

## 2017-12-21 ENCOUNTER — Telehealth: Payer: Self-pay | Admitting: Neurology

## 2017-12-21 NOTE — Telephone Encounter (Signed)
Spoke with pt and his wife relaying MRI results below.

## 2017-12-21 NOTE — Telephone Encounter (Signed)
Patient called to check results of MRI. Thanks

## 2017-12-21 NOTE — Telephone Encounter (Signed)
Pls let him know the MRI brain looked fine, did not show any evidence of tumor, stroke, or bleed. It showed age-related changes, very important to continue working with PCP on monitoring BP, cholesterol, and sugar. Thanks

## 2017-12-21 NOTE — Telephone Encounter (Signed)
MRI was done 10/25

## 2017-12-25 ENCOUNTER — Ambulatory Visit: Payer: Self-pay | Admitting: Nurse Practitioner

## 2017-12-26 ENCOUNTER — Other Ambulatory Visit: Payer: Self-pay | Admitting: Internal Medicine

## 2017-12-28 ENCOUNTER — Ambulatory Visit (INDEPENDENT_AMBULATORY_CARE_PROVIDER_SITE_OTHER): Payer: PPO | Admitting: Nurse Practitioner

## 2017-12-28 ENCOUNTER — Encounter: Payer: Self-pay | Admitting: Nurse Practitioner

## 2017-12-28 VITALS — BP 104/82 | HR 109 | Ht 69.0 in | Wt 182.8 lb

## 2017-12-28 DIAGNOSIS — R110 Nausea: Secondary | ICD-10-CM | POA: Diagnosis not present

## 2017-12-28 DIAGNOSIS — K219 Gastro-esophageal reflux disease without esophagitis: Secondary | ICD-10-CM | POA: Diagnosis not present

## 2017-12-28 DIAGNOSIS — R1310 Dysphagia, unspecified: Secondary | ICD-10-CM

## 2017-12-28 DIAGNOSIS — R634 Abnormal weight loss: Secondary | ICD-10-CM | POA: Diagnosis not present

## 2017-12-28 NOTE — Progress Notes (Signed)
Chief Complaint:    Nausea and GERD  IMPRESSION and PLAN:    58. 67 yo male with history of GERD/remote esophageal stricture.  He is here with recurrent dysphagia to solid food / pills -Further evaluation and treatment patient was scheduled for an EGD with possible esophageal dilation . The risks and benefits of EGD were discussed and the patient agrees to proceed.  -In the interim Advised patient to eat small bites, chew well with liquids in between bites to avoid food impaction. -Antireflux measures discussed.  Wedge pillow recommended  2.  Significant weight loss.  He attributes this to nausea and attributes the nausea to severe depression and anxiety.  He is working with psychiatry, apparently failed several medications recently.  Next step is ECT per patient  -Suspect patient is right regarding etiology of nausea but will further evaluate at time of EGD  3. History of colon polyps.  Due for surveillance around January 2025   HPI:     Patient is a 67 year old male known to Dr. Henrene Fuentes.  He has a history of GERD / peptic stricture and adenomatous colon polyps.  He comes in with his wife today with complaints of nausea, GERD and recurrent dysphagia.  Patient has been battling severe depression for 5 months.  His psychiatrist has apparently tried several medications which have proven ineffective.  Next step was apparently ECT.  Patient feels that his nausea is secondary to anxiety and depression.  Patient feels he does not have a lot to look forward to, does not feel like doing anything.  He does not feel like therapy would be effective .  Rare NSAID use . He has lost several pounds through this.  He was 193 pounds in late March of this year, currently down to 18 pounds.  He has been having difficulty swallowing again, especially pills, meats and bread but feels the majority of his weight loss is secondary to nausea and depression .  His reflux is not optimally controlled with 40 mg of  Protonix daily but he does not always go to bed on an empty stomach and does not have the head of the bed elevated however.  He has regurgitation throughout the day.    Review of systems:     No chest pain, no SOB, no fevers, no urinary sx   Past Medical History:  Diagnosis Date  . Arthritis    NECK  . Cough    NON-PRODUCTIVE  . Depressive disorder    follow by dr Toy Care psychiatry  . Diverticulosis   . Diverticulosis of colon   . Generalized anxiety disorder   . GERD (gastroesophageal reflux disease)    WATCHES DIET  . Hemorrhoid   . High blood pressure   . History of colon polyps   . History of kidney stones   . Microscopic hematuria   . Nocturia   . Panic attacks   . Prostate cancer ([place])   . Sinus infection   . Tinnitus    LEFT EAR -- CHRONIC    Patient's surgical history, family medical history, social history, medications and allergies were all reviewed in Epic   Creatinine clearance cannot be calculated (Patient's most recent lab result is older than the maximum 21 days allowed.)  Current Outpatient Medications  Medication Sig Dispense Refill  . clonazePAM (KLONOPIN) 1 MG tablet Take 1 tablet at 8 am, 1/2 tablet at 2 pm and 1 tablet at bedtime for anxiety. (Patient taking differently:  Take 1 mg by mouth 3 (three) times daily. Pt states "I usually take 1 tablet three times a day") 75 tablet 0  . nortriptyline (PAMELOR) 50 MG capsule Take 50 mg by mouth daily at 8 pm.     . pantoprazole (PROTONIX) 40 MG tablet TAKE 1 TABLET(40 MG) BY MOUTH DAILY (Patient taking differently: Take 40 mg by mouth daily. ) 30 tablet 6   No current facility-administered medications for this visit.     Physical Exam:     BP 104/82   Pulse (!) 109   Ht 5\' 9"  (1.753 m)   Wt 182 lb 12.8 oz (82.9 kg)   SpO2 96%   BMI 26.99 kg/m   GENERAL:  Pleasant male in NAD PSYCH: : Cooperative, normal affect EENT:  conjunctiva pink, mucous membranes moist, neck supple without masses CARDIAC:   RRR, no murmur heard, no peripheral edema PULM: Normal respiratory effort, lungs CTA bilaterally, no wheezing ABDOMEN:  Nondistended, soft, nontender. No obvious masses, no hepatomegaly,  normal bowel sounds SKIN:  turgor, no lesions seen Musculoskeletal:  Normal muscle tone, normal strength NEURO: Alert and oriented x 3, no focal neurologic deficits   Eduardo Fuentes , NP 12/28/2017, 9:15 AM

## 2017-12-28 NOTE — Patient Instructions (Addendum)
If you are age 68 or older, your body mass index should be between 23-30. Your Body mass index is 26.99 kg/m. If this is out of the aforementioned range listed, please consider follow up with your Primary Care Provider.  If you are age 20 or younger, your body mass index should be between 19-25. Your Body mass index is 26.99 kg/m. If this is out of the aformentioned range listed, please consider follow up with your Primary Care Provider.   You have been scheduled for an endoscopy. Please follow written instructions given to you at your visit today. If you use inhalers (even only as needed), please bring them with you on the day of your procedure. Your physician has requested that you go to www.startemmi.com and enter the access code given to you at your visit today. This web site gives a general overview about your procedure. However, you should still follow specific instructions given to you by our office regarding your preparation for the procedure.   Use a wedge pillow.  Thank you for choosing me and [facility].   Tye Savoy, NP

## 2017-12-28 NOTE — Progress Notes (Signed)
Assessment and plan reviewed 

## 2018-01-01 ENCOUNTER — Encounter: Payer: Self-pay | Admitting: Internal Medicine

## 2018-01-07 ENCOUNTER — Encounter: Payer: Self-pay | Admitting: Internal Medicine

## 2018-01-07 ENCOUNTER — Ambulatory Visit (AMBULATORY_SURGERY_CENTER): Payer: PPO | Admitting: Internal Medicine

## 2018-01-07 VITALS — BP 148/92 | HR 79 | Temp 97.1°F | Resp 14 | Ht 69.0 in | Wt 182.0 lb

## 2018-01-07 DIAGNOSIS — R1310 Dysphagia, unspecified: Secondary | ICD-10-CM | POA: Diagnosis not present

## 2018-01-07 DIAGNOSIS — K219 Gastro-esophageal reflux disease without esophagitis: Secondary | ICD-10-CM

## 2018-01-07 DIAGNOSIS — K222 Esophageal obstruction: Secondary | ICD-10-CM

## 2018-01-07 MED ORDER — SODIUM CHLORIDE 0.9 % IV SOLN
500.0000 mL | Freq: Once | INTRAVENOUS | Status: AC
  Administered 2018-01-27: 400 mL via INTRAVENOUS

## 2018-01-07 NOTE — Patient Instructions (Signed)
YOU HAD AN ENDOSCOPIC PROCEDURE TODAY AT [facility]:   Refer to the procedure report that was given to you for any specific questions about what was found during the examination.  If the procedure report does not answer your questions, please call your gastroenterologist to clarify.  If you requested that your care partner not be given the details of your procedure findings, then the procedure report has been included in a sealed envelope for you to review at your convenience later.  YOU SHOULD EXPECT: Some feelings of bloating in the abdomen. Passage of more gas than usual.  Walking can help get rid of the air that was put into your GI tract during the procedure and reduce the bloating. If you had a lower endoscopy (such as a colonoscopy or flexible sigmoidoscopy) you may notice spotting of blood in your stool or on the toilet paper. If you underwent a bowel prep for your procedure, you may not have a normal bowel movement for a few days.  Please Note:  You might notice some irritation and congestion in your nose or some drainage.  This is from the oxygen used during your procedure.  There is no need for concern and it should clear up in a day or so.  SYMPTOMS TO REPORT IMMEDIATELY:   Following upper endoscopy (EGD)  Vomiting of blood or coffee ground material  New chest pain or pain under the shoulder blades  Painful or persistently difficult swallowing  New shortness of breath  Fever of 100F or higher  Black, tarry-looking stools  For urgent or emergent issues, a gastroenterologist can be reached at any hour by calling [phone].   DIET:  Follow a post-dilation diet today (see handout given to you by your recovery nurse), and then you may proceed to your regular diet tomorrow.  Drink plenty of fluids but you should avoid alcoholic beverages for 24 hours.  MEDICATIONS: Continue present medications.  Please see handouts given to you by your recovery  nurse.  ACTIVITY:  You should plan to take it easy for the rest of today and you should NOT DRIVE or use heavy machinery until tomorrow (because of the sedation medicines used during the test).    FOLLOW UP: Our staff will call the number listed on your records the next business day following your procedure to check on you and address any questions or concerns that you may have regarding the information given to you following your procedure. If we do not reach you, we will leave a message.  However, if you are feeling well and you are not experiencing any problems, there is no need to return our call.  We will assume that you have returned to your regular daily activities without incident.  If any biopsies were taken you will be contacted by phone or by letter within the next 1-3 weeks.  Please call us at [phone] if you have not heard about the biopsies in 3 weeks.    SIGNATURES/CONFIDENTIALITY: You and/or your care partner have signed paperwork which will be entered into your electronic medical record.  These signatures attest to the fact that that the information above on your After Visit Summary has been reviewed and is understood.  Full responsibility of the confidentiality of this discharge information lies with you and/or your care-partner.

## 2018-01-07 NOTE — Progress Notes (Signed)
Called to room to assist during endoscopic procedure.  Patient ID and intended procedure confirmed with present staff. Received instructions for my participation in the procedure from the performing physician.  

## 2018-01-07 NOTE — Op Note (Signed)
[place] Patient Name: Eduardo Fuentes Procedure Date: 01/07/2018 10:57 AM MRN: [id number] Endoscopist: Docia Chuck. Henrene Pastor , MD Age: 67 Referring MD:  Date of Birth: Jan 18, 1951 Gender: Male Account #: [id number] Procedure:                Upper GI endoscopy with [facility] dilation of the                            esophagus. 79 French Indications:              Dysphagia Medicines:                Monitored Anesthesia Care Procedure:                Pre-Anesthesia Assessment:                           - Prior to the procedure, a History and Physical                            was performed, and patient medications and                            allergies were reviewed. The patient's tolerance of                            previous anesthesia was also reviewed. The risks                            and benefits of the procedure and the sedation                            options and risks were discussed with the patient.                            All questions were answered, and informed consent                            was obtained. Prior Anticoagulants: The patient has                            taken no previous anticoagulant or antiplatelet                            agents. ASA Grade Assessment: II - A patient with                            mild systemic disease. After reviewing the risks                            and benefits, the patient was deemed in                            satisfactory condition to undergo the procedure.  After obtaining informed consent, the endoscope was                            passed under direct vision. Throughout the                            procedure, the patient's blood pressure, pulse, and                            oxygen saturations were monitored continuously. The                            Endoscope was introduced through the mouth, and                            advanced to the second part of duodenum. The  upper                            GI endoscopy was accomplished without difficulty.                            The patient tolerated the procedure well. Scope In: Scope Out: Findings:                 One benign-appearing, intrinsic moderate stenosis                            was found 38 cm from the incisors. This stenosis                            measured 1.5 cm (inner diameter). The scope was                            withdrawn. Dilation was performed with a Maloney                            dilator with no resistance at 30 Fr.                           The exam of the esophagus was otherwise normal.                           The stomach was normal except for a sliding hiatal                            hernia measuring 2 cm.                           The examined duodenum was normal.                           The cardia and gastric fundus were normal on  retroflexion. Complications:            No immediate complications. Estimated Blood Loss:     Estimated blood loss: none. Impression:               - Benign-appearing esophageal stenosis. Dilated.                           - Normal stomach.                           - Normal examined duodenum.                           - No specimens collected. Recommendation:           - Patient has a contact number available for                            emergencies. The signs and symptoms of potential                            delayed complications were discussed with the                            patient. Return to normal activities tomorrow.                            Written discharge instructions were provided to the                            patient.                           - Resume previous diet.                           - Continue present medications. Docia Chuck. Henrene Pastor, MD 01/07/2018 11:16:26 AM This report has been signed electronically.

## 2018-01-07 NOTE — Progress Notes (Signed)
PT taken to PACU. Monitors in place. VSS. Report given to RN. 

## 2018-01-08 ENCOUNTER — Telehealth: Payer: Self-pay | Admitting: *Deleted

## 2018-01-08 NOTE — Telephone Encounter (Signed)
No answer for post procedure call back. Left message and will attempt to call back later this afternoon. Sm 

## 2018-01-08 NOTE — Telephone Encounter (Signed)
  Follow up Call-  Call back number 01/07/2018  Post procedure Call Back phone  # [phone]  Permission to leave phone message Yes  Some recent data might be hidden     Patient questions:  Do you have a fever, pain , or abdominal swelling? No. Pain Score  0 *  Have you tolerated food without any problems? Yes.    Have you been able to return to your normal activities? Yes.    Do you have any questions about your discharge instructions: Diet   No. Medications  No. Follow up visit  No.  Do you have questions or concerns about your Care? No.  Actions: * If pain score is 4 or above: No action needed, pain <4.

## 2018-01-11 ENCOUNTER — Ambulatory Visit (INDEPENDENT_AMBULATORY_CARE_PROVIDER_SITE_OTHER): Payer: PPO | Admitting: Psychiatry

## 2018-01-11 ENCOUNTER — Encounter: Payer: Self-pay | Admitting: Psychiatry

## 2018-01-11 DIAGNOSIS — F332 Major depressive disorder, recurrent severe without psychotic features: Secondary | ICD-10-CM | POA: Diagnosis not present

## 2018-01-11 NOTE — Progress Notes (Signed)
ECT: This is an ECT consult for this 67 year old man referred by his outpatient psychiatrist, Dr.Kaur.  Patient was on time and presented for the appointment in the company of his wife.  History from the patient with some assistance from his wife and from review of the chart.  Patient reports current symptoms of depressed mood feeling down and sad and negative all the time.  Feels tired most of the time.  Has lost all interest in most of his normal activities.  Rarely feels like doing much of anything.  Has been eating significantly less and has lost about 15 pounds since May.  Denies having any suicidal thoughts.  Denies hallucinations.  He has been having his current symptoms since approximately May of this year.  During that time he has continued to see his outpatient psychiatrist who has had him on more than one antidepressant during that time.  He is currently on 30 mg a day of mirtazapine as well as regular 3 times a day clonazepam and does not feel he has had significant benefit.  Patient denies alcohol or drug abuse.  Denies specific stressors that have driven his symptoms.  Social history: Patient is married lives with his wife.  Retired.  Seems to have a supportive relationship at home.  Medical history: Patient has a history of tinnitus a past history of prostate cancer which appears to be stable chronic gastric reflux symptoms.  No history of heart disease or stroke.  No history of renal disease.  Substance abuse history: Denies alcohol use.  Denies any past history of alcohol or drug abuse.  No evidence of it in the chart.  Mental status exam: Casually dressed man who looks his stated age.  Cooperative with the interview.  Good eye contact normal psychomotor activity.  Speech normal rate tone and volume.  Affect blunted but not completely flat.  Mood stated as depressed.  Thoughts lucid but easily confused.  No sign however of any delusional or psychotic thinking.  Alert and oriented x4.  Short  term memory shows some impairment and he gets confused with directions easily and needs them repeated several times.  Denies hallucinations.  As noted denies suicidal and denies homicidal ideation.  Shows an appropriate understanding of his condition and is able to make appropriate decisions about his own care.  Has appropriate questions to ask about ECT.  Assessment: This is a 67 year old man with recurrent severe depression.  He has had 1 previous psychiatric hospitalization some years ago.  No history of suicide attempts.  Has been on multiple antidepressants.  Reports that Prozac had been helpful for him for a long time but then lost its effectiveness.  Patient has recurrent severe depression without current response to appropriate treatment.  He does not have any medical conditions that would be a contraindication to a course of ECT.  Patient is an appropriate candidate for ECT treatment.  ECT was described with some detail and the patient was given opportunity to ask as many questions as he wished.  I recommend that we initiate a course of right unilateral ECT for his depression.  Because of scheduling the earliest we would be able to start is December 2.  Patient was given the appropriate forms and directions to get the lab testing done.  We discussed his medication and I advised him to not take his clonazepam the night before or morning of his ECT treatments but otherwise we can leave his medicines as they are.  Patient and wife  are agreeable.  I will forward email to nursing staff.

## 2018-01-13 DIAGNOSIS — J0100 Acute maxillary sinusitis, unspecified: Secondary | ICD-10-CM | POA: Diagnosis not present

## 2018-01-15 ENCOUNTER — Ambulatory Visit
Admission: RE | Admit: 2018-01-15 | Discharge: 2018-01-15 | Disposition: A | Discharge status: Home / Self Care | Payer: PPO | Source: Ambulatory Visit | Attending: Psychiatry | Admitting: Psychiatry

## 2018-01-15 ENCOUNTER — Encounter
Admission: RE | Admit: 2018-01-15 | Discharge: 2018-01-15 | Disposition: A | Discharge status: Home / Self Care | Payer: PPO | Source: Ambulatory Visit | Attending: Psychiatry | Admitting: Psychiatry

## 2018-01-15 DIAGNOSIS — Z01818 Encounter for other preprocedural examination: Secondary | ICD-10-CM | POA: Insufficient documentation

## 2018-01-15 DIAGNOSIS — R918 Other nonspecific abnormal finding of lung field: Secondary | ICD-10-CM | POA: Diagnosis not present

## 2018-01-15 DIAGNOSIS — Z01810 Encounter for preprocedural cardiovascular examination: Secondary | ICD-10-CM

## 2018-01-15 LAB — BASIC METABOLIC PANEL
Anion gap: 7 (ref 5–15)
BUN: 16 mg/dL (ref 8–23)
CALCIUM: 9 mg/dL (ref 8.9–10.3)
CHLORIDE: 102 mmol/L (ref 98–111)
CO2: 28 mmol/L (ref 22–32)
CREATININE: 1.18 mg/dL (ref 0.61–1.24)
GFR calc non Af Amer: >60 mL/min (ref >60)
Glucose, Bld: 90 mg/dL (ref 70–99)
Potassium: 4.5 mmol/L (ref 3.5–5.1)
SODIUM: 137 mmol/L (ref 135–145)

## 2018-01-15 LAB — URINALYSIS, ROUTINE W REFLEX MICROSCOPIC
BILIRUBIN URINE: NEGATIVE
Glucose, UA: NEGATIVE mg/dL
HGB URINE DIPSTICK: NEGATIVE
KETONES UR: 5 mg/dL — AB
Leukocytes, UA: NEGATIVE
NITRITE: NEGATIVE
PH: 5 (ref 5.0–8.0)
Protein, ur: NEGATIVE mg/dL
SPECIFIC GRAVITY, URINE: 1.025 (ref 1.005–1.030)

## 2018-01-15 LAB — CBC
HCT: 46.1 % (ref 39.0–52.0)
Hemoglobin: 15.2 g/dL (ref 13.0–17.0)
MCH: 30.4 pg (ref 26.0–34.0)
MCHC: 33 g/dL (ref 30.0–36.0)
MCV: 92.2 fL (ref 80.0–100.0)
NRBC: 0 % (ref 0.0–0.2)
Platelets: 306 10*3/uL (ref 150–400)
RBC: 5 MIL/uL (ref 4.22–5.81)
RDW: 14 % (ref 11.5–15.5)
WBC: 8 10*3/uL (ref 4.0–10.5)

## 2018-01-18 ENCOUNTER — Telehealth: Payer: Self-pay

## 2018-01-18 ENCOUNTER — Other Ambulatory Visit: Payer: Self-pay | Admitting: Psychiatry

## 2018-01-18 DIAGNOSIS — J849 Interstitial pulmonary disease, unspecified: Secondary | ICD-10-CM

## 2018-01-18 NOTE — Progress Notes (Signed)
Abnormal chest xray needs CT before ECT

## 2018-01-20 ENCOUNTER — Ambulatory Visit
Admission: RE | Admit: 2018-01-20 | Discharge: 2018-01-20 | Disposition: A | Discharge status: Home / Self Care | Payer: PPO | Source: Ambulatory Visit | Attending: Psychiatry | Admitting: Psychiatry

## 2018-01-20 DIAGNOSIS — R918 Other nonspecific abnormal finding of lung field: Secondary | ICD-10-CM | POA: Diagnosis not present

## 2018-01-20 DIAGNOSIS — J849 Interstitial pulmonary disease, unspecified: Secondary | ICD-10-CM

## 2018-01-20 DIAGNOSIS — R590 Localized enlarged lymph nodes: Secondary | ICD-10-CM | POA: Diagnosis not present

## 2018-01-20 DIAGNOSIS — K449 Diaphragmatic hernia without obstruction or gangrene: Secondary | ICD-10-CM | POA: Diagnosis not present

## 2018-01-24 ENCOUNTER — Other Ambulatory Visit: Payer: Self-pay | Admitting: Psychiatry

## 2018-01-25 ENCOUNTER — Telehealth: Payer: Self-pay | Admitting: *Deleted

## 2018-01-25 ENCOUNTER — Inpatient Hospital Stay: Admission: RE | Admit: 2018-01-25 | Payer: Self-pay | Source: Ambulatory Visit

## 2018-01-26 ENCOUNTER — Other Ambulatory Visit: Payer: Self-pay | Admitting: Psychiatry

## 2018-01-27 ENCOUNTER — Encounter: Payer: Self-pay | Admitting: Anesthesiology

## 2018-01-27 ENCOUNTER — Encounter
Admission: RE | Admit: 2018-01-27 | Discharge: 2018-01-27 | Disposition: A | Discharge status: Home / Self Care | Payer: PPO | Source: Ambulatory Visit | Attending: Psychiatry | Admitting: Psychiatry

## 2018-01-27 DIAGNOSIS — Z8546 Personal history of malignant neoplasm of prostate: Secondary | ICD-10-CM | POA: Insufficient documentation

## 2018-01-27 DIAGNOSIS — F332 Major depressive disorder, recurrent severe without psychotic features: Secondary | ICD-10-CM | POA: Diagnosis not present

## 2018-01-27 DIAGNOSIS — Z888 Allergy status to other drugs, medicaments and biological substances status: Secondary | ICD-10-CM | POA: Diagnosis not present

## 2018-01-27 DIAGNOSIS — Z818 Family history of other mental and behavioral disorders: Secondary | ICD-10-CM | POA: Insufficient documentation

## 2018-01-27 DIAGNOSIS — F329 Major depressive disorder, single episode, unspecified: Secondary | ICD-10-CM | POA: Diagnosis not present

## 2018-01-27 DIAGNOSIS — F339 Major depressive disorder, recurrent, unspecified: Secondary | ICD-10-CM | POA: Diagnosis not present

## 2018-01-27 DIAGNOSIS — Z881 Allergy status to other antibiotic agents status: Secondary | ICD-10-CM | POA: Insufficient documentation

## 2018-01-27 DIAGNOSIS — F418 Other specified anxiety disorders: Secondary | ICD-10-CM | POA: Diagnosis not present

## 2018-01-27 DIAGNOSIS — Z886 Allergy status to analgesic agent status: Secondary | ICD-10-CM | POA: Insufficient documentation

## 2018-01-27 DIAGNOSIS — Z9089 Acquired absence of other organs: Secondary | ICD-10-CM | POA: Diagnosis not present

## 2018-01-27 DIAGNOSIS — K219 Gastro-esophageal reflux disease without esophagitis: Secondary | ICD-10-CM | POA: Diagnosis not present

## 2018-01-27 DIAGNOSIS — Z885 Allergy status to narcotic agent status: Secondary | ICD-10-CM | POA: Insufficient documentation

## 2018-01-27 DIAGNOSIS — I10 Essential (primary) hypertension: Secondary | ICD-10-CM | POA: Diagnosis not present

## 2018-01-27 MED ORDER — SODIUM CHLORIDE 0.9 % IV SOLN
500.0000 mL | Freq: Once | INTRAVENOUS | Status: AC
  Administered 2018-01-27: 500 mL via INTRAVENOUS

## 2018-01-27 MED ORDER — SUCCINYLCHOLINE CHLORIDE 200 MG/10ML IV SOSY
PREFILLED_SYRINGE | INTRAVENOUS | Status: DC | PRN
  Administered 2018-01-27: 100 mg via INTRAVENOUS

## 2018-01-27 MED ORDER — LABETALOL HCL 5 MG/ML IV SOLN
INTRAVENOUS | Status: DC | PRN
  Administered 2018-01-27: 10 mg via INTRAVENOUS
  Administered 2018-01-27: 5 mg via INTRAVENOUS

## 2018-01-27 MED ORDER — SUCCINYLCHOLINE CHLORIDE 20 MG/ML IJ SOLN
INTRAMUSCULAR | Status: AC
  Filled 2018-01-27: qty 1

## 2018-01-27 MED ORDER — LABETALOL HCL 5 MG/ML IV SOLN
INTRAVENOUS | Status: AC
  Filled 2018-01-27: qty 4

## 2018-01-27 MED ORDER — METHOHEXITAL SODIUM 100 MG/10ML IV SOSY
PREFILLED_SYRINGE | INTRAVENOUS | Status: DC | PRN
  Administered 2018-01-27: 80 mg via INTRAVENOUS

## 2018-01-27 MED ORDER — MIDAZOLAM HCL 2 MG/2ML IJ SOLN: 2.0000 mg | Freq: Once | INTRAMUSCULAR | Status: DC

## 2018-01-27 NOTE — Discharge Instructions (Signed)
1)  The drugs that you have been given will stay in your system until tomorrow so for the       next 24 hours you should not:  A. Drive an automobile  B. Make any legal decisions  C. Drink any alcoholic beverages  2)  You may resume your regular meals upon return home.  3)  A responsible adult must take you home.  Someone should stay with you for a few          hours, then be available by phone for the remainder of the treatment day.  4)  You May experience any of the following symptoms:  Headache, Nausea and a dry mouth (due to the medications you were given),  temporary memory loss and some confusion, or sore muscles (a warm bath  should help this).  If you you experience any of these symptoms let us know on                your return visit.  5)  Report any of the following: any acute discomfort, severe headache, or temperature        greater than 100.5 F.   Also report any unusual redness, swelling, drainage, or pain         at your IV site.    You may report Symptoms to:  [place] [facility]          Phone: [phone], ECT Department           or Dr. Prescott Gum office [phone]  6)  Your next ECT Treatment is Friday December 6 at 8:30   We will call 2 days prior to your scheduled appointment for arrival times.  7)  Nothing to eat or drink after midnight the night before your procedure.  8)  Take Protonix    With a sip of water the morning of your procedure.  9)  Other Instructions: Call [phone] to cancel the morning of your procedure due         to illness or emergency.  10) We will call within 72 hours to assess how you are feeling.

## 2018-01-27 NOTE — H&P (Signed)
Eduardo Fuentes is an 67 y.o. male.   Chief Complaint: Patient with chronic severe depression and anxiety starting ECT HPI: History of recurrent severe depression without consistent response to medication  Past Medical History:  Diagnosis Date  . Arthritis    NECK  . Cough    NON-PRODUCTIVE  . Depressive disorder    follow by dr [facility] psychiat[facility]  . Diverticulosis   . Diverticulosis of colon   . Generalized anxiety disorder   . GERD (gastroesophageal reflux disease)    WATCHES DIET  . Hemorrhoid   . High blood pressure   . History of colon polyps   . History of kidney stones   . Microscopic hematuria   . Nocturia   . Panic attacks   . Prostate cancer ([place])   . Sinus infection   . Tinnitus    LEFT EAR -- CHRONIC    Past Surgical History:  Procedure Laterality Date  . COLONOSCOPY    . ESOPHAGOGASTRODUODENOSCOPY    . EXTRACORPOREAL SHOCK WAVE LITHOTRIPSY  YRS AGO  . PROSTATE BIOPSY    . RADIOACTIVE SEED IMPLANT N/A 02/02/2013   Procedure: RADIOACTIVE SEED IMPLANT;  Surgeon: Molli Hazard, MD;  Location: [facility];  Service: Urology;  Laterality: N/A;  . TONSILLECTOMY AND ADENOIDECTOMY  AS CHILD    Family History  Problem Relation Age of Onset  . Dementia Mother   . Anxiety disorder Mother   . Alzheimer's disease Mother   . Heart disease Father   . Bone cancer Father   . Cancer Father        prostate  . Anxiety disorder Sister   . Colon cancer Neg Hx   . Esophageal cancer Neg Hx   . Rectal cancer Neg Hx   . Stomach cancer Neg Hx    Social History:  reports that he has never smoked. He has never used smokeless tobacco. He reports that he does not drink alcohol or use drugs.  Allergies:  Allergies  Allergen Reactions  . Aleve [Naproxen Sodium] Anaphylaxis and Other (See Comments)    GI UPSET Patient does not remember anaphylaxis  . Hyzaar [Losartan Potassium-Hctz] Other (See Comments)    Felt bad   . Lisinopril Cough  . Demerol  [Meperidine] Rash          (Not in a hospital admission)  No results found for this or any previous visit (from the past 48 hour(s)). No results found.  Review of Systems  Constitutional: Negative.   HENT: Negative.   Eyes: Negative.   Respiratory: Negative.   Cardiovascular: Negative.   Gastrointestinal: Negative.   Musculoskeletal: Negative.   Skin: Negative.   Neurological: Negative.   Psychiatric/Behavioral: Positive for depression. Negative for hallucinations, memory loss, substance abuse and suicidal ideas. The patient is nervous/anxious and has insomnia.     Blood pressure (!) 115/113, pulse 95, temperature 98 F (36.7 C), temperature source Oral, height 5\' 9"  (1.753 m), weight 87.1 kg, SpO2 98 %. Physical Exam  Nursing note and vitals reviewed. Constitutional: He appears well-developed and well-nourished.  HENT:  Head: Normocephalic and atraumatic.  Eyes: Pupils are equal, round, and reactive to light. Conjunctivae are normal.  Neck: Normal range of motion.  Cardiovascular: Regular rhythm and normal heart sounds.  Respiratory: Effort normal. No respiratory distress.  GI: Soft.  Musculoskeletal: Normal range of motion.  Neurological: He is alert.  Skin: Skin is warm and dry.  Psychiatric: His speech is normal. Judgment normal. His mood appears  anxious. His affect is not blunt. He is agitated. He is not aggressive and not hyperactive. Thought content is not paranoid. Cognition and memory are normal. He expresses no homicidal and no suicidal ideation.     Assessment/Plan Treatment beginning today on 3 times a week schedule for right unilateral treatment as an outpatient  Alethia Berthold, MD 01/27/2018, 10:48 AM

## 2018-01-27 NOTE — Anesthesia Preprocedure Evaluation (Signed)
Anesthesia Evaluation  Patient identified by MRN, date of birth, ID band Patient awake    Reviewed: Allergy & Precautions, H&P , NPO status , reviewed documented beta blocker date and time   Airway Mallampati: II  TM Distance: >3 FB Neck ROM: full    Dental  (+) Poor Dentition, Chipped Very irregular alignment, no loose:   Pulmonary shortness of breath and with exertion,  CXR w increased interstitial markings. No cough or sputum production. DOE w yard/house work. Denies COPD or exposure. SOB noted Spring 2019, more or less stable since then  B Basilar Crackles         Cardiovascular hypertension, Normal cardiovascular exam     Neuro/Psych PSYCHIATRIC DISORDERS Anxiety Depression    GI/Hepatic GERD  Controlled,  Endo/Other    Renal/GU      Musculoskeletal  (+) Arthritis ,   Abdominal   Peds  Hematology   Anesthesia Other Findings Past Medical History: No date: Arthritis     Comment:  NECK No date: Cough     Comment:  NON-PRODUCTIVE No date: Depressive disorder     Comment:  follow by dr [facility] psychiat[facility] No date: Diverticulosis No date: Diverticulosis of colon No date: Generalized anxiety disorder No date: GERD (gastroesophageal reflux disease)     Comment:  WATCHES DIET No date: Hemorrhoid No date: High blood pressure No date: History of colon polyps No date: History of kidney stones No date: Microscopic hematuria No date: Nocturia No date: Panic attacks No date: Prostate cancer ([place]) No date: Sinus infection No date: Tinnitus     Comment:  LEFT EAR -- CHRONIC  Past Surgical History: No date: COLONOSCOPY No date: ESOPHAGOGASTRODUODENOSCOPY YRS AGO: EXTRACORPOREAL SHOCK WAVE LITHOTRIPSY No date: PROSTATE BIOPSY 02/02/2013: RADIOACTIVE SEED IMPLANT; N/A     Comment:  Procedure: RADIOACTIVE SEED IMPLANT;  Surgeon: Molli Hazard, MD;  Location: [facility];  Service: Urology;  Laterality: N/A; AS CHILD: TONSILLECTOMY AND ADENOIDECTOMY  BMI    Body Mass Index:  28.35 kg/m      Reproductive/Obstetrics                             Anesthesia Physical Anesthesia Plan  ASA: III  Anesthesia Plan: General   Post-op Pain Management:    Induction: Intravenous  PONV Risk Score and Plan: 2 and Treatment may vary due to age or medical condition and TIVA  Airway Management Planned: Natural Airway and Simple Face Mask  Additional Equipment:   Intra-op Plan:   Post-operative Plan:   Informed Consent: I have reviewed the patients History and Physical, chart, labs and discussed the procedure including the risks, benefits and alternatives for the proposed anesthesia with the patient or authorized representative who has indicated his/her understanding and acceptance.   Dental Advisory Given  Plan Discussed with: CRNA  Anesthesia Plan Comments:         Anesthesia Quick Evaluation

## 2018-01-27 NOTE — Procedures (Signed)
ECT SERVICES Physician's Interval Evaluation & Treatment Note  Patient Identification: Eduardo Fuentes MRN:  [id number] Date of Evaluation:  01/27/2018 TX #: 1  MADRS:   MMSE:   P.E. Findings:  No change to physical exam.  Heart and lungs stable.  Psychiatric Interval Note:  Depressed and anxious  Subjective:  Patient is a 67 y.o. male seen for evaluation for Electroconvulsive Therapy. Depressed and anxious  Treatment Summary:   [x]   Right Unilateral             []  Bilateral   % Energy : 0.3 ms 70%   Impedance: 2160 ohms  Seizure Energy Index: 7740 V squared  Postictal Suppression Index: 54%  Seizure Concordance Index: 84%  Medications  Pre Shock: Brevital 80 mg succinylcholine 100 mg  Post Shock: Labetalol 20 mg  Seizure Duration: 32 seconds EMG 68 seconds EEG   Comments: Next treatment Friday  Lungs:  [x]   Clear to auscultation               []  Other:   Heart:    [x]   Regular rhythm             []  irregular rhythm    [x]   Previous H&P reviewed, patient examined and there are NO CHANGES                 []   Previous H&P reviewed, patient examined and there are changes noted.   Alethia Berthold, MD 12/4/201910:51 AM

## 2018-01-27 NOTE — Anesthesia Postprocedure Evaluation (Signed)
Anesthesia Post Note  Patient: Eduardo Fuentes  Procedure(s) Performed: ECT TX  Patient location during evaluation: PACU Anesthesia Type: General Level of consciousness: awake and alert Pain management: pain level controlled Vital Signs Assessment: post-procedure vital signs reviewed and stable Respiratory status: spontaneous breathing, nonlabored ventilation and respiratory function stable Cardiovascular status: blood pressure returned to baseline and stable Postop Assessment: no apparent nausea or vomiting Anesthetic complications: no     Last Vitals:  Vitals:   01/27/18 1140 01/27/18 1147  BP:  (!) 163/105  Pulse: 84 80  Resp: 20 16  Temp: 36.7 C 36.7 C  SpO2: 94%     Last Pain:  Vitals:   01/27/18 1147  TempSrc: Oral  PainSc: 3                  [name]

## 2018-01-27 NOTE — Transfer of Care (Signed)
Immediate Anesthesia Transfer of Care Note  Patient: Eduardo Fuentes  Procedure(s) Performed: ECT TX  Patient Location: PACU  Anesthesia Type:General  Level of Consciousness: sedated  Airway & Oxygen Therapy: Patient Spontanous Breathing and Patient connected to face mask oxygen  Post-op Assessment: Report given to RN and Post -op Vital signs reviewed and stable  Post vital signs: Reviewed and stable  Last Vitals:  Vitals Value Taken Time  BP 167/109 01/27/2018 11:15 AM  Temp 37 C 01/27/2018 11:15 AM  Pulse 84 01/27/2018 11:19 AM  Resp 19 01/27/2018 11:19 AM  SpO2 99 % 01/27/2018 11:19 AM  Vitals shown include unvalidated device data.  Last Pain:  Vitals:   01/27/18 1115  TempSrc:   PainSc: 0-No pain         Complications: No apparent anesthesia complications

## 2018-01-27 NOTE — Progress Notes (Signed)
DR Lavone Neri aware of BP being 160/107, stated pt ok to be discharged

## 2018-01-27 NOTE — Anesthesia Procedure Notes (Signed)
Date/Time: 01/27/2018 11:01 AM Performed by: Dionne Bucy, CRNA Pre-anesthesia Checklist: Patient identified, Emergency Drugs available, Suction available and Patient being monitored Patient Re-evaluated:Patient Re-evaluated prior to induction Oxygen Delivery Method: Circle system utilized Preoxygenation: Pre-oxygenation with 100% oxygen Induction Type: IV induction Ventilation: Mask ventilation without difficulty and Mask ventilation throughout procedure Airway Equipment and Method: Bite block Placement Confirmation: positive ETCO2 Dental Injury: Teeth and Oropharynx as per pre-operative assessment

## 2018-01-27 NOTE — Anesthesia Post-op Follow-up Note (Signed)
Anesthesia QCDR form completed.        

## 2018-01-29 ENCOUNTER — Ambulatory Visit: Payer: PPO | Admitting: Internal Medicine

## 2018-01-29 ENCOUNTER — Encounter: Payer: Self-pay | Admitting: Internal Medicine

## 2018-01-29 VITALS — BP 142/100 | HR 89 | Ht 69.0 in | Wt 189.0 lb

## 2018-01-29 DIAGNOSIS — R0600 Dyspnea, unspecified: Secondary | ICD-10-CM | POA: Diagnosis not present

## 2018-01-29 DIAGNOSIS — J849 Interstitial pulmonary disease, unspecified: Secondary | ICD-10-CM | POA: Diagnosis not present

## 2018-01-29 DIAGNOSIS — Z01811 Encounter for preprocedural respiratory examination: Secondary | ICD-10-CM

## 2018-01-29 NOTE — Progress Notes (Signed)
Subjective:    Patient ID: Eduardo Fuentes, male    DOB: 1950/07/30, 67 y.o.   MRN: [id number]    HPI IOV 01/29/2018  Chief Complaint  Patient presents with  . Pulmonary Consult    self referral, abnormal CT, ? ILD from CT scan, SOB with exertion     Eduardo Fuentes presents self-referral for interstitial lung disease.  His wife Eduardo Fuentes used to be seen by me some years ago when she had surgical lung biopsy for ruling out interstitial lung disease which we did.  Patient is a non-smoker but has long suffered from acid reflux and esophageal stenosis.  He also has a sliding hiatal hernia.  He says since spring 2019 he has had insidious onset of shortness of breath that is progressive associated with some cough.  During this time he is also had worsening endogenous depression.  He was seeing a psychiatrist multiple antidepressants have failed.  Electroconvulsive therapy has been recommended and he had one on January 27, 2018 2 days ago.  During this time a chest x-ray was done as part of pre-electroconvulsive therapy evaluation and this suggested interstitial lung disease.  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  I agree with the radiologist.  The some amount of mediastinal adenopathy that is felt to be reactive.  There is also a sliding hiatal hernia.  It is described that these findings are new compared to 2014 chest x-ray.  At this point in time walking desaturation test he did not desaturate but dropped 4 points and pulse ox    [place] Integrated Comprehensive ILD Questionnaire  Symptoms: He reports shortness of breath onset gradually for the last 1 year.  It is getting worse with time.  He does have episodic dyspnea.  When he does shopping it is level 1.  When he walks on a level ground with others of his age at this level 1 walking up stairs at this level 1 but walking at his own pace at this  level 2 and taking a shower at level 3.  He does have associated arthralgia and cough.  He is not sure when the cough started.  It is present at all positions since it started it is the same as moderate in intensity.  He does cough at night.  He does bring up phlegm which is clear in color.  It gets worse when he lies down and it affects his voice and he does clear his throat.  He does not feel a tickle.  There is no wheezing.   Past Medical History : This is positive for major depression but he circled no for asthma, COPD.  Surgical no for heart failure.  Surgical no for collagen vascular disease with sleep apnea.  He did not answer for acid reflux which he does have..  Denies diabetes or thyroid disease or heart disease or pulmonary embolism or pneumonia   ROS:  .  Positive for difficulty swallowing due to his esophageal stricture history.  Does have nausea and acid reflux and snoring.  Otherwise no vomiting is positive for soreness across the body after electroconvulsive therapy 2 days ago.   FAMILY HISTORY of LUNG DISEASE: Denies family history of any pulmonary fibrosis COPD or asthma sarcoidosis   EXPOSURE HISTORY: Denies smoking cigarettes or tobacco or marijuana or cocaine.  No intravenous drug abuse.   HOME and HOBBY DETAILS : He did not  list the age of the home but is lived in this home for 25 [place].  There is no mold in the house of mildew.  The house is not damp.  Does not use a CPAP mask.  No nebulizer machine.  No use of Jacuzzi.  Nose fountain in the house.  No pet birds or gerbils.  Does not use feather pillows.  He is not sure if there is mold in the [facility] duct system.  Does not play wind instruments.  He occasionally mows the lawn   OCCUPATIONAL HISTORY (122 questions) : Positive for social gardening.  Did report that he is used feather pillows and blankets in the past but otherwise negative including working in a dusty environment   PULMONARY TOXICITY HISTORY  (27 items): Positive for cancer chemotherapy but he does not know the details         01/29/2018   O2 used Room air  Number laps completed 3  Comments about pace 250 feet x 3 laps on POD B at [employer]   Resting Pulse Ox/HR 100% and 89/min  Final Pulse Ox/HR 96% and 109/min  Desaturated </= 88% no  Desaturated <= 3% points Yes, 4poin  Got Tachycardic >/= 90/min yes  Symptoms at end of test no  Miscellaneous comments no     has a past medical history of Arthritis, Cough, Depressive disorder, Diverticulosis, Diverticulosis of colon, Generalized anxiety disorder, GERD (gastroesophageal reflux disease), Hemorrhoid, High blood pressure, History of colon polyps, History of kidney stones, Microscopic hematuria, Nocturia, Panic attacks, Prostate cancer ([place]), Sinus infection, and Tinnitus.   reports that he has never smoked. He has never used smokeless tobacco.  Past Surgical History:  Procedure Laterality Date  . COLONOSCOPY    . ESOPHAGOGASTRODUODENOSCOPY    . EXTRACORPOREAL SHOCK WAVE LITHOTRIPSY  YRS AGO  . PROSTATE BIOPSY    . RADIOACTIVE SEED IMPLANT N/A 02/02/2013   Procedure: RADIOACTIVE SEED IMPLANT;  Surgeon: Molli Hazard, MD;  Location: [facility];  Service: Urology;  Laterality: N/A;  . TONSILLECTOMY AND ADENOIDECTOMY  AS CHILD    Allergies  Allergen Reactions  . Aleve [Naproxen Sodium] Anaphylaxis and Other (See Comments)    GI UPSET Patient does not remember anaphylaxis  . Hyzaar [Losartan Potassium-Hctz] Other (See Comments)    Felt bad   . Lisinopril Cough  . Demerol [Meperidine] Rash          There is no immunization history on file for this patient.  Family History  Problem Relation Age of Onset  . Dementia Mother   . Anxiety disorder Mother   . Alzheimer's disease Mother   . Heart disease Father   . Bone cancer Father   . Cancer Father        prostate  . Anxiety disorder Sister   . Colon cancer Neg Hx   .  Esophageal cancer Neg Hx   . Rectal cancer Neg Hx   . Stomach cancer Neg Hx      Current Outpatient Medications:  .  clonazePAM (KLONOPIN) 1 MG tablet, Take 1 tablet at 8 am, 1/2 tablet at 2 pm and 1 tablet at bedtime for anxiety. (Patient taking differently: Take 1 mg by mouth 3 (three) times daily. Pt states "I usually take 1 tablet three times a day"), Disp: 75 tablet, Rfl: 0 .  nortriptyline (PAMELOR) 50 MG capsule, Take 50 mg by mouth daily at 8 pm. , Disp: , Rfl:  .  pantoprazole (PROTONIX) 40 MG tablet, TAKE 1 TABLET BY MOUTH DAILY, Disp: 30 tablet, Rfl: 6    Review of Systems  Constitutional: Negative for fever and unexpected weight change.  HENT: Negative for congestion, dental problem, ear pain, nosebleeds, postnasal drip, rhinorrhea, sinus pressure, sneezing, sore throat and trouble swallowing.   Eyes: Negative for redness and itching.  Respiratory: Positive for shortness of breath. Negative for cough, chest tightness and wheezing.   Cardiovascular: Negative for palpitations and leg swelling.  Gastrointestinal: Negative for nausea and vomiting.  Genitourinary: Negative for dysuria.  Musculoskeletal: Negative for joint swelling.  Skin: Negative for rash.  Neurological: Positive for headaches.  Hematological: Does not bruise/bleed easily.  Psychiatric/Behavioral: Negative for dysphoric mood. The patient is nervous/anxious.        Objective:   Physical Exam  Vitals:   01/29/18 1157  BP: (!) 142/100  Pulse: 89  SpO2: 94%  Weight: 189 lb (85.7 kg)  Height: 5\' 9"  (1.753 m)   Estimated body mass index is 27.91 kg/m as calculated from the following:   Height as of this encounter: 5\' 9"  (1.753 m).   Weight as of this encounter: 189 lb (85.7 kg).  General Appearance:    Alert, cooperative, no distress, appears stated age -looks older , sitting on - chair  Head:    Normocephalic, without obvious abnormality, atraumatic  Eyes:    PERRL, conjunctiva/corneas clear,  Ears:     Normal TM's and external ear canals, both ears  Nose:   Nares normal, septum midline, mucosa normal, no drainage    or sinus tenderness. OXYGEN ON no @ ra  Throat:   Lips, mucosa, and tongue normal; teeth and gums normal. Cyanosis on lips - no  Neck:   Supple, symmetrical, trachea midline, no adenopathy;    thyroid:  no enlargement/tenderness/nodules; no carotid   bruit or JVD  Back:     Symmetric, no curvature, ROM normal, no CVA tenderness  Lungs:     Distress - no , Wheeze no, Barrell Chest - no, Purse lip breathing - no, Crackles -bilateral bibasal Velcro crackles with a craniocaudal gradient  Chest Wall:    No tenderness or deformity. Scars in chest no   Heart:    Regular rate and rhythm, S1 and S2 normal, no murmur, rub   or gallop  Breast Exam:    NOT DONE  Abdomen:     Soft, non-tender, bowel sounds active all four quadrants,    no masses, no organomegaly  Genitalia:   NOT DONE  Rectal:   NOT DONE  Extremities:   Extremities normal, atraumatic, Clubbing -mild early clubbing present bilaterally, Edema - no  Pulses:   2+ and symmetric all extremities  Skin:   Stigmata of Connective Tissue Disease - STIGMATA of CONNECTIVE TISSUE DISEASE  - Distal digital fissuring (ie, "mechanic hands") - no - Distal digital tip ulceration - no -Inflammatory arthritis or polyarticular morning joint stiffness ?60 minutes - no - Palmar telangiectasia - no - Raynaud phenomenon - ? maybe - Unexplained digital edema - no - Unexplained fixed rash on the digital extensor surfaces (Gottron's sign) - no ... - Deformities of RA - no - Scleroderma  - no - Malar Rash -  no   Lymph nodes:   Cervical, supraclavicular, and axillary nodes normal  Psychiatric:  Neurologic:   Flat affect, dpressed but not suicidal CNII-XII intact, normal strength, sensation  throughout           Assessment & Plan:  ICD-10-CM   1. ILD (interstitial lung disease) (HCC) J84.9 Pulmonary function test  2. Dyspnea,  unspecified type R06.00 ANA    Rheumatoid Factor    Cyclic citrul peptide antibody, IgG    Aldolase    CK Total (and CKMB)    Hypersensitivity pnuemonitis profile    Pulmonary function test    Hypersensitivity pnuemonitis profile    CK Total (and CKMB)    Aldolase    Cyclic citrul peptide antibody, IgG    Rheumatoid Factor    ANA  3. Preop respiratory exam Z01.811      Plan #1 concern is that he has IPF.  This is because of male greater than 65, associated acid reflux/hiatal hernia, early clubbing, non-smoking history .  In the differential diagnosis is hypersensitivity pneumonitis (and autoimmune connective tissue disease ILD; both low probability..  We will obtain serology, pulmonary function testing and then reevaluate him.  However given desaturation test does not drop below 88% and resting pulse ox on room air is normal his disease severity is currently mild.  He had a lot of questions about prognosis but this depends on specific diagnosis.  Therefore will be able to address this with him when he presents for follow-up.  From a preoperative standpoint he is already withstood 1 electroconvulsive therapy so I think it is safe for him to do more   I have explained this to him and his wife and they both are understanding and verbalized agreement to the plan.   SIGNATURE    Dr. Brand Males, M.D., F.C.C.P,  Pulmonary and Critical Care Medicine Staff Physician, [place] Director - Interstitial Lung Disease  Program  [facility] [place], [place], [address]  Pager: [phone], If no answer or between  15:00h - 7:00h: call [phone] Telephone: [phone]  12:43 PM 01/29/2018

## 2018-01-29 NOTE — Patient Instructions (Addendum)
ICD-10-CM   1. ILD (interstitial lung disease) ([place]) J84.9   2. Dyspnea, unspecified type R06.00 ANA    Rheumatoid Factor    Cyclic citrul peptide antibody, IgG    Aldolase    CK Total (and CKMB)    Hypersensitivity pnuemonitis profile  3. Preop respiratory exam Z01.811      Do ANA,RF, CCP, Aldolase, CK, Hypersensitivity pneumonitis profile  Do spirometry and dlco   Ok for sedation related ECT therapy   Followup =- next few weeks with DR Chase Caller in 30 min slot (general or ILD clinic)

## 2018-01-31 ENCOUNTER — Other Ambulatory Visit: Payer: Self-pay | Admitting: Psychiatry

## 2018-02-01 ENCOUNTER — Encounter
Admission: RE | Admit: 2018-02-01 | Discharge: 2018-02-01 | Disposition: A | Discharge status: Home / Self Care | Payer: PPO | Source: Ambulatory Visit | Attending: Psychiatry | Admitting: Psychiatry

## 2018-02-01 ENCOUNTER — Encounter: Payer: Self-pay | Admitting: Anesthesiology

## 2018-02-01 DIAGNOSIS — F339 Major depressive disorder, recurrent, unspecified: Secondary | ICD-10-CM | POA: Diagnosis not present

## 2018-02-01 DIAGNOSIS — F329 Major depressive disorder, single episode, unspecified: Secondary | ICD-10-CM | POA: Diagnosis not present

## 2018-02-01 MED ORDER — SUCCINYLCHOLINE CHLORIDE 20 MG/ML IJ SOLN
INTRAMUSCULAR | Status: AC
  Filled 2018-02-01: qty 1

## 2018-02-01 MED ORDER — MIDAZOLAM HCL 2 MG/2ML IJ SOLN
INTRAMUSCULAR | Status: AC
  Filled 2018-02-01: qty 2

## 2018-02-01 MED ORDER — SODIUM CHLORIDE 0.9 % IV SOLN
INTRAVENOUS | Status: DC | PRN
  Administered 2018-02-01: 09:00:00 via INTRAVENOUS

## 2018-02-01 MED ORDER — LABETALOL HCL 5 MG/ML IV SOLN
10.0000 mg | Freq: Once | INTRAVENOUS | Status: AC
  Administered 2018-02-01: 10 mg via INTRAVENOUS

## 2018-02-01 MED ORDER — METHOHEXITAL SODIUM 100 MG/10ML IV SOSY
PREFILLED_SYRINGE | INTRAVENOUS | Status: DC | PRN
  Administered 2018-02-01: 80 mg via INTRAVENOUS

## 2018-02-01 MED ORDER — METHOHEXITAL SODIUM 0.5 G IJ SOLR
INTRAMUSCULAR | Status: AC
  Filled 2018-02-01: qty 500

## 2018-02-01 MED ORDER — KETOROLAC TROMETHAMINE 30 MG/ML IJ SOLN
INTRAMUSCULAR | Status: AC
  Filled 2018-02-01: qty 1

## 2018-02-01 MED ORDER — LABETALOL HCL 5 MG/ML IV SOLN
INTRAVENOUS | Status: AC
  Filled 2018-02-01: qty 4

## 2018-02-01 MED ORDER — LABETALOL HCL 5 MG/ML IV SOLN
INTRAVENOUS | Status: DC | PRN
  Administered 2018-02-01 (×2): 10 mg via INTRAVENOUS

## 2018-02-01 MED ORDER — MIDAZOLAM HCL 2 MG/2ML IJ SOLN
2.0000 mg | Freq: Once | INTRAMUSCULAR | Status: AC
  Administered 2018-02-01: 2 mg via INTRAVENOUS

## 2018-02-01 MED ORDER — SUCCINYLCHOLINE CHLORIDE 200 MG/10ML IV SOSY
PREFILLED_SYRINGE | INTRAVENOUS | Status: DC | PRN
  Administered 2018-02-01: 100 mg via INTRAVENOUS

## 2018-02-01 MED ORDER — KETOROLAC TROMETHAMINE 30 MG/ML IJ SOLN
30.0000 mg | Freq: Once | INTRAMUSCULAR | Status: AC
  Administered 2018-02-01: 30 mg via INTRAVENOUS

## 2018-02-01 MED ORDER — SODIUM CHLORIDE 0.9 % IV SOLN
500.0000 mL | Freq: Once | INTRAVENOUS | Status: AC
  Administered 2018-02-01: 500 mL via INTRAVENOUS

## 2018-02-01 NOTE — Progress Notes (Signed)
Blood pressure 164/109   Dr Randa Lynn aware and is good with this

## 2018-02-01 NOTE — Anesthesia Post-op Follow-up Note (Signed)
Anesthesia QCDR form completed.        

## 2018-02-01 NOTE — H&P (Signed)
Eduardo Fuentes is an 67 y.o. male.   Chief Complaint: Ongoing depression and anxiety  HPI: history of chronic depression now getting ect Ongoing index ect  Past Medical History:  Diagnosis Date  . Arthritis    NECK  . Cough    NON-PRODUCTIVE  . Depressive disorder    follow by dr [facility]  . Diverticulosis   . Diverticulosis of colon   . Generalized anxiety disorder   . GERD (gastroesophageal reflux disease)    WATCHES DIET  . Hemorrhoid   . High blood pressure   . History of colon polyps   . History of kidney stones   . Microscopic hematuria   . Nocturia   . Panic attacks   . Prostate cancer ([place])   . Sinus infection   . Tinnitus    LEFT EAR -- CHRONIC    Past Surgical History:  Procedure Laterality Date  . COLONOSCOPY    . ESOPHAGOGASTRODUODENOSCOPY    . EXTRACORPOREAL SHOCK WAVE LITHOTRIPSY  YRS AGO  . PROSTATE BIOPSY    . RADIOACTIVE SEED IMPLANT N/A 02/02/2013   Procedure: RADIOACTIVE SEED IMPLANT;  Surgeon: Molli Hazard, MD;  Location: [facility];  Service: Urology;  Laterality: N/A;  . TONSILLECTOMY AND ADENOIDECTOMY  AS CHILD    Family History  Problem Relation Age of Onset  . Dementia Mother   . Anxiety disorder Mother   . Alzheimer's disease Mother   . Heart disease Father   . Bone cancer Father   . Cancer Father        prostate  . Anxiety disorder Sister   . Colon cancer Neg Hx   . Esophageal cancer Neg Hx   . Rectal cancer Neg Hx   . Stomach cancer Neg Hx    Social History:  reports that he has never smoked. He has never used smokeless tobacco. He reports that he does not drink alcohol or use drugs.  Allergies:  Allergies  Allergen Reactions  . Aleve [Naproxen Sodium] Anaphylaxis and Other (See Comments)    GI UPSET Patient does not remember anaphylaxis  . Hyzaar [Losartan Potassium-Hctz] Other (See Comments)    Felt bad   . Lisinopril Cough  . Demerol [Meperidine] Rash          (Not in a hospital  admission)  No results found for this or any previous visit (from the past 48 hour(s)). No results found.  Review of Systems  Constitutional: Negative.   HENT: Negative.   Eyes: Negative.   Respiratory: Negative.   Cardiovascular: Negative.   Gastrointestinal: Negative.   Musculoskeletal: Negative.   Skin: Negative.   Neurological: Negative.   Psychiatric/Behavioral: Positive for depression. Negative for hallucinations, substance abuse and suicidal ideas. The patient is nervous/anxious.     Blood pressure (!) 170/119, pulse 93, temperature 97.7 F (36.5 C), temperature source Oral, resp. rate 20, height 5\' 9"  (1.753 m), weight 86.2 kg, SpO2 97 %. Physical Exam  Nursing note and vitals reviewed. Constitutional: He appears well-developed and well-nourished.  HENT:  Head: Normocephalic and atraumatic.  Eyes: Pupils are equal, round, and reactive to light. Conjunctivae are normal.  Neck: Normal range of motion.  Cardiovascular: Regular rhythm and normal heart sounds.  Respiratory: Effort normal. No respiratory distress.  GI: Soft.  Musculoskeletal: Normal range of motion.  Neurological: He is alert.  Skin: Skin is warm and dry.  Psychiatric: His mood appears anxious. His speech is delayed. He is slowed. Cognition and memory are  impaired. He expresses impulsivity. He expresses no homicidal and no suicidal ideation.     Assessment/Plan Index ect  Alethia Berthold, MD 02/01/2018, 10:07 AM

## 2018-02-01 NOTE — Progress Notes (Signed)
Labetalol 10mg  given for blood pressure 170/121

## 2018-02-01 NOTE — Anesthesia Postprocedure Evaluation (Signed)
Anesthesia Post Note  Patient: Eduardo Fuentes  Procedure(s) Performed: ECT TX  Patient location during evaluation: PACU Anesthesia Type: General Level of consciousness: awake and alert Pain management: pain level controlled Vital Signs Assessment: post-procedure vital signs reviewed and stable Respiratory status: spontaneous breathing, nonlabored ventilation and respiratory function stable Cardiovascular status: blood pressure returned to baseline and stable Postop Assessment: no signs of nausea or vomiting Anesthetic complications: no     Last Vitals:  Vitals:   02/01/18 1110 02/01/18 1121  BP: (!) 164/109 (!) 152/102  Pulse: 73 70  Resp: 20 18  Temp:    SpO2: 95%     Last Pain:  Vitals:   02/01/18 1121  TempSrc: Oral  PainSc: 0-No pain                 Oda Lansdowne

## 2018-02-01 NOTE — Anesthesia Procedure Notes (Signed)
Date/Time: 02/01/2018 10:33 AM Performed by: Dionne Bucy, CRNA Pre-anesthesia Checklist: Patient identified, Emergency Drugs available, Suction available and Patient being monitored Patient Re-evaluated:Patient Re-evaluated prior to induction Oxygen Delivery Method: Circle system utilized Preoxygenation: Pre-oxygenation with 100% oxygen Induction Type: IV induction Ventilation: Mask ventilation without difficulty and Mask ventilation throughout procedure Airway Equipment and Method: Bite block Placement Confirmation: positive ETCO2 Dental Injury: Teeth and Oropharynx as per pre-operative assessment

## 2018-02-01 NOTE — Anesthesia Preprocedure Evaluation (Addendum)
Anesthesia Evaluation  Patient identified by MRN, date of birth, ID band Patient awake    Reviewed: Allergy & Precautions, NPO status , Patient's Chart, lab work & pertinent test results  History of Anesthesia Complications Negative for: history of anesthetic complications  Airway Mallampati: II  TM Distance: >3 FB Neck ROM: Full    Dental no notable dental hx.    Pulmonary neg pulmonary ROS, neg sleep apnea, neg COPD,    breath sounds clear to auscultation- rhonchi (-) wheezing      Cardiovascular Exercise Tolerance: Good hypertension, (-) CAD, (-) Past MI, (-) Cardiac Stents and (-) CABG  Rhythm:Regular Rate:Normal - Systolic murmurs and - Diastolic murmurs    Neuro/Psych neg Seizures PSYCHIATRIC DISORDERS Anxiety Depression negative neurological ROS     GI/Hepatic Neg liver ROS, GERD  ,  Endo/Other  negative endocrine ROSneg diabetes  Renal/GU negative Renal ROS     Musculoskeletal  (+) Arthritis ,   Abdominal (+) - obese,   Peds  Hematology negative hematology ROS (+)   Anesthesia Other Findings Past Medical History: No date: Arthritis     Comment:  NECK No date: Cough     Comment:  NON-PRODUCTIVE No date: Depressive disorder     Comment:  follow by dr [facility] No date: Diverticulosis No date: Diverticulosis of colon No date: Generalized anxiety disorder No date: GERD (gastroesophageal reflux disease)     Comment:  WATCHES DIET No date: Hemorrhoid No date: High blood pressure No date: History of colon polyps No date: History of kidney stones No date: Microscopic hematuria No date: Nocturia No date: Panic attacks No date: Prostate cancer ([place]) No date: Sinus infection No date: Tinnitus     Comment:  LEFT EAR -- CHRONIC   Reproductive/Obstetrics                             Anesthesia Physical Anesthesia Plan  ASA: II  Anesthesia Plan: General   Post-op Pain  Management:    Induction: Intravenous  PONV Risk Score and Plan: 1 and Ondansetron and Midazolam  Airway Management Planned: Mask  Additional Equipment:   Intra-op Plan:   Post-operative Plan:   Informed Consent: I have reviewed the patients History and Physical, chart, labs and discussed the procedure including the risks, benefits and alternatives for the proposed anesthesia with the patient or authorized representative who has indicated his/her understanding and acceptance.   Dental advisory given  Plan Discussed with: CRNA and Anesthesiologist  Anesthesia Plan Comments:         Anesthesia Quick Evaluation

## 2018-02-01 NOTE — Progress Notes (Signed)
Climbing out of bed  Versed 2mg  given

## 2018-02-01 NOTE — Discharge Instructions (Signed)
1)  The drugs that you have been given will stay in your system until tomorrow so for the       next 24 hours you should not:  A. Drive an automobile  B. Make any legal decisions  C. Drink any alcoholic beverages  2)  You may resume your regular meals upon return home.  3)  A responsible adult must take you home.  Someone should stay with you for a few          hours, then be available by phone for the remainder of the treatment day.  4)  You May experience any of the following symptoms:  Headache, Nausea and a dry mouth (due to the medications you were given),  temporary memory loss and some confusion, or sore muscles (a warm bath  should help this).  If you you experience any of these symptoms let us know on                your return visit.  5)  Report any of the following: any acute discomfort, severe headache, or temperature        greater than 100.5 F.   Also report any unusual redness, swelling, drainage, or pain         at your IV site.    You may report Symptoms to:  [place] at [facility]          Phone: [phone], ECT Department           or Dr. Prescott Gum office [phone]  6)  Your next ECT Treatment is Wednesday December 11 at 8:30   We will call 2 days prior to your scheduled appointment for arrival times.  7)  Nothing to eat or drink after midnight the night before your procedure.  8)  Take Protonix     With a sip of water the morning of your procedure.  9)  Other Instructions: Call [phone] to cancel the morning of your procedure due         to illness or emergency.  10) We will call within 72 hours to assess how you are feeling.

## 2018-02-01 NOTE — Transfer of Care (Signed)
Immediate Anesthesia Transfer of Care Note  Patient: Eduardo Fuentes  Procedure(s) Performed: ECT TX  Patient Location: PACU  Anesthesia Type:General  Level of Consciousness: sedated  Airway & Oxygen Therapy: Patient Spontanous Breathing and Patient connected to face mask oxygen  Post-op Assessment: Report given to RN and Post -op Vital signs reviewed and stable  Post vital signs: Reviewed and stable  Last Vitals:  Vitals Value Taken Time  BP 205/193 02/01/2018 10:44 AM  Temp    Pulse 97 02/01/2018 10:45 AM  Resp 19 02/01/2018 10:45 AM  SpO2 96 % 02/01/2018 10:45 AM  Vitals shown include unvalidated device data.  Last Pain:  Vitals:   02/01/18 0855  TempSrc: Oral  PainSc: 5          Complications: No apparent anesthesia complications

## 2018-02-02 DIAGNOSIS — I10 Essential (primary) hypertension: Secondary | ICD-10-CM | POA: Diagnosis not present

## 2018-02-03 ENCOUNTER — Telehealth: Payer: Self-pay | Admitting: *Deleted

## 2018-02-03 ENCOUNTER — Other Ambulatory Visit: Payer: Self-pay | Admitting: Psychiatry

## 2018-02-03 LAB — ANTI-NUCLEAR AB-TITER (ANA TITER): ANA Titer 1: 1:80 {titer} — ABNORMAL HIGH

## 2018-02-03 LAB — CK TOTAL AND CKMB (NOT AT ARMC)
CK, MB: 1.1 ng/mL (ref 0–5.0)
Relative Index: 1.5 (ref 0–4.0)
Total CK: 75 U/L (ref 44–196)

## 2018-02-03 LAB — HYPERSENSITIVITY PNUEMONITIS PROFILE
ASPERGILLUS FUMIGATUS: NEGATIVE
Faenia retivirgula: NEGATIVE
Pigeon Serum: NEGATIVE
S. VIRIDIS: NEGATIVE
T. CANDIDUS: NEGATIVE
T. VULGARIS: NEGATIVE

## 2018-02-03 LAB — ANA: Anti Nuclear Antibody(ANA): POSITIVE — AB

## 2018-02-03 LAB — CYCLIC CITRUL PEPTIDE ANTIBODY, IGG: Cyclic Citrullin Peptide Ab: <16 UNITS

## 2018-02-03 LAB — RHEUMATOID FACTOR: Rheumatoid fact SerPl-aCnc: <14 IU/mL (ref <14)

## 2018-02-03 LAB — ALDOLASE

## 2018-02-05 ENCOUNTER — Inpatient Hospital Stay: Admission: RE | Admit: 2018-02-05 | Payer: Self-pay | Source: Ambulatory Visit

## 2018-02-05 ENCOUNTER — Telehealth: Payer: Self-pay

## 2018-02-07 ENCOUNTER — Other Ambulatory Visit: Payer: Self-pay | Admitting: Psychiatry

## 2018-02-08 ENCOUNTER — Telehealth: Payer: Self-pay

## 2018-02-08 NOTE — Progress Notes (Signed)
@Patient  ID: Eduardo Fuentes, male    DOB: 08-31-1950, 67 y.o.   MRN: [id number]  Chief Complaint  Patient presents with  . Shortness of Breath    Patient feels like it has gotten worse since last visit.     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 Chase Caller  02/09/2018  - Visit   67 year old male never smoker presenting today for an acute visit.  Patient reports that he has had increased dry cough whenever taking deep breaths as well as.  Patient continues to struggle with depression and is put his ECT treatments on hold.  Patient reports that he has not had acute shortness of breath but his shortness of breath is persisting.  Patient has completed recent autoimmune blood work which was normal except for trace ANA.  ILD questionnaire results are listed below.   Tests:  01/20/2018-CT chest high-res-spectrum of findings compatible with basilar predominant fibrotic interstitial lung disease with mild honeycombing these findings are new compared to November 2014 chest radiograph.  Findings are consistent with UIP.   01/29/2018- hypersensitivity pneumonitis, CK, aldolase, CCP, Rh, ANA, ANA titer-trace positive ANA but otherwise autoimmune labs normal  01/29/2018- integrative comprehensive ILD questionnaire [place] Integrated Comprehensive ILD Questionnaire  Symptoms: He reports shortness of breath onset gradually for the last 1 year.  It is getting worse with time.  He does have episodic dyspnea.  When he does shopping it is level 1.  When he walks on a level ground with others of his age at this level 1 walking up stairs at this level 1 but walking at his own pace at this level 2 and taking a shower at level 3.  He does have associated arthralgia and cough.  He is not sure when the cough started.  It is  present at all positions since it started it is the same as moderate in intensity.  He does cough at night.  He does bring up phlegm which is clear in color.  It gets worse when he lies down and it affects his voice and he does clear his throat.  He does not feel a tickle.  There is no wheezing.   Past Medical History : This is positive for major depression but he circled no for asthma, COPD.  Surgical no for heart failure.  Surgical no for collagen vascular disease with sleep apnea.  He did not answer for acid reflux which he does have..  Denies diabetes or thyroid disease or heart disease or pulmonary embolism or pneumonia   ROS:  .  Positive for difficulty swallowing due to his esophageal stricture history.  Does have nausea and acid reflux and snoring.  Otherwise no vomiting is positive for soreness across the body after electroconvulsive therapy 2 days ago.   FAMILY HISTORY of LUNG DISEASE: Denies family history of any pulmonary fibrosis COPD or asthma sarcoidosis   EXPOSURE HISTORY: Denies smoking cigarettes or tobacco or marijuana or cocaine.  No intravenous drug abuse.   HOME and HOBBY DETAILS : He did not list the age of the home but is lived in this home [place].  There is no mold in the house of mildew.  The house is not damp.  Does not use a CPAP mask.  No nebulizer machine.  No use of  Jacuzzi.  Nose fountain in the house.  No pet birds or gerbils.  Does not use feather pillows.  He is not sure if there is mold in the [facility] duct system.  Does not play wind instruments.  He occasionally mows the lawn   OCCUPATIONAL HISTORY (122 questions) : Positive for social gardening.  Did report that he is used feather pillows and blankets in the past but otherwise negative including working in a dusty environment   PULMONARY TOXICITY HISTORY (27 items): Positive for cancer chemotherapy but he does not know the details  FENO:  No results found for:  NITRICOXIDE  PFT: No flowsheet data found.  Imaging: Dg Chest 2 View  Result Date: 01/16/2018 CLINICAL DATA:  Preoperative evaluation EXAM: CHEST - 2 VIEW COMPARISON:  Chest radiograph 12/31/2012 FINDINGS: Stable cardiac and mediastinal contours. Interval development of diffuse bilateral coarse interstitial pulmonary opacities. No pleural effusion or pneumothorax. Thoracic spine degenerative changes. IMPRESSION: Interval development of diffuse bilateral coarse interstitial pulmonary opacities concerning for chronic interstitial lung disease. Recommend further evaluation with high-resolution chest CT. Electronically Signed   By: Lovey Newcomer M.D.   On: 01/16/2018 11:07   Ct Chest High Resolution  Result Date: 01/20/2018 CLINICAL DATA:  Interstitial prominence on recent preoperative chest radiograph. EXAM: CT CHEST WITHOUT CONTRAST TECHNIQUE: Multidetector CT imaging of the chest was performed following the standard protocol without intravenous contrast. High resolution imaging of the lungs, as well as inspiratory and expiratory imaging, was performed. COMPARISON:  01/15/2018 chest radiograph. FINDINGS: Cardiovascular: Normal heart size. No significant pericardial effusion/thickening. Three-vessel coronary atherosclerosis. Atherosclerotic nonaneurysmal thoracic aorta. Normal caliber pulmonary arteries. Mediastinum/Nodes: No discrete thyroid nodules. Unremarkable esophagus. No axillary adenopathy. Right paratracheal adenopathy up to 1.6 cm (series 2/image 47). Enlarged 1.1 cm subcarinal node (series 2/image 66). AP window adenopathy measuring up to 1.3 cm (series 2/image 52). Symmetric mild bilateral hilar adenopathy, poorly delineated on these noncontrast images. Lungs/Pleura: No pneumothorax. No pleural effusion. No acute consolidative airspace disease or lung masses. Two scattered subpleural solid pulmonary nodules, largest 6 mm in the posterior left upper lobe (series 3/image 65). There is extensive  patchy confluent subpleural reticulation and ground-glass attenuation throughout both lungs with associated mild traction bronchiectasis, architectural distortion and volume loss. There is mild honeycombing at the extreme lung bases bilaterally. No significant air trapping on the expiration sequence. There is a mild basilar gradient to these findings. Upper abdomen: Small hiatal hernia. Small simple liver cysts, largest 1.4 cm in the right lower lobe. Musculoskeletal: No aggressive appearing focal osseous lesions. Moderate thoracic spondylosis. IMPRESSION: 1. Spectrum of findings compatible with basilar predominant fibrotic interstitial lung disease with mild honeycombing. These findings are new compared to 12/31/2012 chest radiograph. Findings are consistent with UIP per consensus guidelines: Diagnosis of Idiopathic Pulmonary Fibrosis: An Official ATS/ERS/JRS/ALAT Clinical Practice Guideline. [place], Iss 5, [phone], Oct 25 2016. 2. Two solid pulmonary nodules, largest 6 mm. Non-contrast chest CT at 3-6 months is recommended. If the nodules are stable at time of repeat CT, then future CT at 18-24 months (from today's scan) is considered optional for low-risk patients, but is recommended for high-risk patients. This recommendation follows the consensus statement: Guidelines for Management of Incidental Pulmonary Nodules Detected on CT Images: From the Fleischner Society 2017; Radiology 2017; 284:228-243. 3. Mild-to-moderate mediastinal bilateral hilar adenopathy, nonspecific, probably reactive. 4. Three-vessel coronary atherosclerosis. 5. Small hiatal hernia. Aortic Atherosclerosis (ICD10-I70.0). Electronically Signed   By: Janina Mayo.D.  On: 01/20/2018 13:54    Chart Review:    Specialty Problems      Pulmonary Problems   ILD (interstitial lung disease) ([place])    01/20/2018-CT chest high-res-spectrum of findings compatible with basilar predominant fibrotic interstitial lung  disease with mild honeycombing these findings are new compared to November 2014 chest radiograph.  Findings are consistent with UIP.   01/29/2018- hypersensitivity pneumonitis, CK, aldolase, CCP, Rh, ANA, ANA titer-trace positive ANA but otherwise autoimmune labs normal  01/29/2018- integrative comprehensive ILD questionnaire [place] Integrated Comprehensive ILD Questionnaire  Symptoms: He reports shortness of breath onset gradually for the last 1 year.  It is getting worse with time.  He does have episodic dyspnea.  When he does shopping it is level 1.  When he walks on a level ground with others of his age at this level 1 walking up stairs at this level 1 but walking at his own pace at this level 2 and taking a shower at level 3.  He does have associated arthralgia and cough.  He is not sure when the cough started.  It is present at all positions since it started it is the same as moderate in intensity.  He does cough at night.  He does bring up phlegm which is clear in color.  It gets worse when he lies down and it affects his voice and he does clear his throat.  He does not feel a tickle.  There is no wheezing.   Past Medical History : This is positive for major depression but he circled no for asthma, COPD.  Surgical no for heart failure.  Surgical no for collagen vascular disease with sleep apnea.  He did not answer for acid reflux which he does have..  Denies diabetes or thyroid disease or heart disease or pulmonary embolism or pneumonia   ROS:  .  Positive for difficulty swallowing due to his esophageal stricture history.  Does have nausea and acid reflux and snoring.  Otherwise no vomiting is positive for soreness across the body after electroconvulsive therapy 2 days ago.   FAMILY HISTORY of LUNG DISEASE: Denies family history of any pulmonary fibrosis COPD or asthma sarcoidosis   EXPOSURE HISTORY: Denies smoking cigarettes or tobacco or marijuana or cocaine.  No intravenous drug  abuse.   HOME and HOBBY DETAILS : He did not list the age of the home but is lived in this home for 25 years [place] an [place] setting.  There is no mold in the house of mildew.  The house is not damp.  Does not use a CPAP mask.  No nebulizer machine.  No use of Jacuzzi.  Nose fountain in the house.  No pet birds or gerbils.  Does not use feather pillows.  He is not sure if there is mold in the [facility] duct system.  Does not play wind instruments.  He occasionally mows the lawn   OCCUPATIONAL HISTORY (122 questions) : Positive for social gardening.  Did report that he is used feather pillows and blankets in the past but otherwise negative including working in a dusty environment   PULMONARY TOXICITY HISTORY (27 items): Positive for cancer chemotherapy but he does not know the details  PFT >>>         Allergies  Allergen Reactions  . Aleve [Naproxen Sodium] Anaphylaxis and Other (See Comments)    GI UPSET Patient does not remember anaphylaxis  . Hyzaar [Losartan Potassium-Hctz] Other (See Comments)    Felt bad   .  Lisinopril Cough  . Demerol [Meperidine] Rash          There is no immunization history on file for this patient.  >>>has had flu vaccine this year   >>>will check with pcp about pneumonia vaccines   Past Medical History:  Diagnosis Date  . Arthritis    NECK  . Cough    NON-PRODUCTIVE  . Depressive disorder    follow by dr [facility]  . Diverticulosis   . Diverticulosis of colon   . Generalized anxiety disorder   . GERD (gastroesophageal reflux disease)    WATCHES DIET  . Hemorrhoid   . High blood pressure   . History of colon polyps   . History of kidney stones   . Microscopic hematuria   . Nocturia   . Panic attacks   . Prostate cancer ([place])   . Sinus infection   . Tinnitus    LEFT EAR -- CHRONIC    Tobacco History: Social History   Tobacco Use  Smoking Status Never Smoker  Smokeless Tobacco Never Used   Counseling given: Not  Answered   Outpatient Encounter Medications as of 02/09/2018  Medication Sig  . clonazePAM (KLONOPIN) 1 MG tablet Take 1 tablet at 8 am, 1/2 tablet at 2 pm and 1 tablet at bedtime for anxiety. (Patient taking differently: Take 1 mg by mouth 3 (three) times daily. Pt states "I usually take 1 tablet three times a day")  . mirtazapine (REMERON) 30 MG tablet TK 1 T PO QHS  . nortriptyline (PAMELOR) 50 MG capsule Take 50 mg by mouth daily at 8 pm.   . pantoprazole (PROTONIX) 40 MG tablet TAKE 1 TABLET BY MOUTH DAILY  . benzonatate (TESSALON) 200 MG capsule Take 1 capsule (200 mg total) by mouth 3 (three) times daily as needed for cough.   No facility-administered encounter medications on file as of 02/09/2018.      Review of Systems  Review of Systems  Constitutional: Negative for activity change, chills, fatigue, fever and unexpected weight change.  HENT: Negative for postnasal drip, rhinorrhea, sinus pressure, sinus pain, sneezing and sore throat.   Eyes: Negative.   Respiratory: Positive for cough (dry inspiratory cough ) and shortness of breath. Negative for wheezing.   Cardiovascular: Negative for chest pain and palpitations.  Gastrointestinal: Negative for constipation, diarrhea, nausea and vomiting.  Endocrine: Negative.   Musculoskeletal: Negative.   Skin: Negative.   Neurological: Negative for dizziness and headaches.  Psychiatric/Behavioral: Negative.  Negative for dysphoric mood. The patient is not nervous/anxious.   All other systems reviewed and are negative.    Physical Exam  BP 116/78 (BP Location: Left Arm, Patient Position: Sitting, Cuff Size: Normal)   Pulse (!) 107   Ht 5\' 9"  (1.753 m)   Wt 188 lb 3.2 oz (85.4 kg)   SpO2 93%   BMI 27.79 kg/m   Wt Readings from Last 5 Encounters:  02/09/18 188 lb 3.2 oz (85.4 kg)  02/01/18 190 lb (86.2 kg)  01/29/18 189 lb (85.7 kg)  01/27/18 192 lb (87.1 kg)  01/07/18 182 lb (82.6 kg)   Physical Exam  Constitutional: He  is oriented to person, place, and time and well-developed, well-nourished, and in no distress. He does not have a sickly appearance. No distress.  HENT:  Head: Normocephalic and atraumatic.  Right Ear: Hearing, tympanic membrane, external ear and ear canal normal.  Left Ear: Hearing, tympanic membrane, external ear and ear canal normal.  Nose: Nose normal. Right  sinus exhibits no maxillary sinus tenderness and no frontal sinus tenderness. Left sinus exhibits no maxillary sinus tenderness and no frontal sinus tenderness.  Mouth/Throat: Uvula is midline and oropharynx is clear and moist. No oropharyngeal exudate.  Eyes: Pupils are equal, round, and reactive to light.  Neck: Normal range of motion. Neck supple. No JVD present.  Cardiovascular: Regular rhythm and normal heart sounds. Tachycardia present.  Pulmonary/Chest: Effort normal. No accessory muscle usage. No respiratory distress. He has no decreased breath sounds. He has no wheezes. He has no rhonchi. He has rales (Bibasilar crackles left greater than right).  Musculoskeletal: Normal range of motion.        General: No edema.  Lymphadenopathy:    He has no cervical adenopathy.  Neurological: He is alert and oriented to person, place, and time. Gait normal.  Skin: Skin is warm and dry. He is not diaphoretic. No erythema.  Psychiatric: Memory, affect and judgment normal. His mood appears anxious. He exhibits a depressed mood.  + Continues to be anxious about recent ILD diagnosis as well as continues to struggle with depression  Nursing note and vitals reviewed.   Walk today in office >>> Patient was able to complete 3 laps initial oxygen saturation was 96% on room air oxygenation dropped to 92% on the third lap.  Patient has remained tachycardic, with increased heart rate to 116  Lab Results:  CBC    Component Value Date/Time   WBC 8.0 01/15/2018 1323   RBC 5.00 01/15/2018 1323   HGB 15.2 01/15/2018 1323   HCT 46.1 01/15/2018 1323    PLT 306 01/15/2018 1323   MCV 92.2 01/15/2018 1323   MCH 30.4 01/15/2018 1323   MCHC 33.0 01/15/2018 1323   RDW 14.0 01/15/2018 1323   LYMPHSABS 0.9 01/26/2013 1102   MONOABS 0.2 01/26/2013 1102   EOSABS 0.1 01/26/2013 1102   BASOSABS 0.0 01/26/2013 1102    BMET    Component Value Date/Time   NA 137 01/15/2018 1323   K 4.5 01/15/2018 1323   CL 102 01/15/2018 1323   CO2 28 01/15/2018 1323   GLUCOSE 90 01/15/2018 1323   BUN 16 01/15/2018 1323   CREATININE 1.18 01/15/2018 1323   CALCIUM 9.0 01/15/2018 1323   GFRNONAA >60 01/15/2018 1323   GFRAA >60 01/15/2018 1323    BNP No results found for: BNP  ProBNP No results found for: PROBNP    Assessment & Plan:   Pleasant 67 year old male patient complaining follow-up with our office today.  Will move patient's pulmonary function test up and will have patient have an office visit with myself.  Will have patient do full 1 hour PFT.  Patient will probably need to be started on anti-fibrotic space off of UIP on CT and relatively normal autoimmune labs.  Patient is already received a flu vaccine patient will check with primary care and status of pneumonia vaccines if not we will be able to provide at next office visit.  Patient to keep follow-up with Dr. Chase Caller in January/2020  ILD (interstitial lung disease) ([place]) Walk in office today   Full PFT with follow up with Wyn Quaker FNP   Tessalon pearles sent in for cough  >>>use as needed for cough every 8 hours   Keep follow up with Dr. Chase Caller  This appointment was 33 minutes along with over 50% of the time in direct face-to-face patient care, assessment, plan of care follow-up and has discussion of ILD work-up as well as IPF diagnosis.  Lauraine Rinne, NP 02/09/2018

## 2018-02-09 ENCOUNTER — Ambulatory Visit (INDEPENDENT_AMBULATORY_CARE_PROVIDER_SITE_OTHER): Payer: PPO | Admitting: Pulmonary Disease

## 2018-02-09 ENCOUNTER — Encounter: Payer: Self-pay | Admitting: Pulmonary Disease

## 2018-02-09 VITALS — BP 116/78 | HR 107 | Ht 69.0 in | Wt 188.2 lb

## 2018-02-09 DIAGNOSIS — J322 Chronic ethmoidal sinusitis: Secondary | ICD-10-CM | POA: Diagnosis not present

## 2018-02-09 DIAGNOSIS — J849 Interstitial pulmonary disease, unspecified: Secondary | ICD-10-CM

## 2018-02-09 DIAGNOSIS — J370 Chronic laryngitis: Secondary | ICD-10-CM | POA: Diagnosis not present

## 2018-02-09 DIAGNOSIS — J84112 Idiopathic pulmonary fibrosis: Secondary | ICD-10-CM | POA: Insufficient documentation

## 2018-02-09 DIAGNOSIS — J320 Chronic maxillary sinusitis: Secondary | ICD-10-CM | POA: Diagnosis not present

## 2018-02-09 MED ORDER — BENZONATATE 200 MG PO CAPS: 200.0000 mg | ORAL_CAPSULE | Freq: Three times a day (TID) | ORAL | 1 refills | Status: DC | PRN

## 2018-02-09 NOTE — Patient Instructions (Signed)
Walk in office today   Full PFT with follow up with Wyn Quaker FNP   Tessalon pearles sent in for cough  >>>use as needed for cough every 8 hours   Keep follow up with Dr. Chase Caller  It is flu season:   >>>Remember to be washing your hands regularly, using hand sanitizer, be careful to use around herself with has contact with people who are sick will increase her chances of getting sick yourself. >>> Best ways to protect herself from the flu: Receive the yearly flu vaccine, practice good hand hygiene washing with soap and also using hand sanitizer when available, eat a nutritious meals, get adequate rest, hydrate appropriately   Please contact the office if your symptoms worsen or you have concerns that you are not improving.   Thank you for choosing [facility] for your healthcare, and for allowing [place] to partner with you on your healthcare journey. I am thankful to be able to provide care to you today.   Wyn Quaker FNP-C

## 2018-02-09 NOTE — Assessment & Plan Note (Signed)
Walk in office today   Full PFT with follow up with Wyn Quaker FNP   Tessalon pearles sent in for cough  >>>use as needed for cough every 8 hours   Keep follow up with Dr. Chase Caller

## 2018-02-10 NOTE — Progress Notes (Signed)
@Patient  ID: Eduardo Fuentes, male    DOB: 1951/02/08, 67 y.o.   MRN: [id number]  Chief Complaint  Patient presents with  . 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 Chase Caller  Recent [facility] Encounters:   02/09/2018  - Visit - BM 67 year old male never smoker presenting today for an acute visit.  Patient reports that he has had increased dry cough whenever taking deep breaths as well as.  Patient continues to struggle with depression and is put his ECT treatments on hold.  Patient reports that he has not had acute shortness of breath but his shortness of breath is persisting.  Patient has completed recent autoimmune blood work which was normal except for trace ANA.  ILD questionnaire results are listed below. Plan: PFT to be moved up, keep follow up with MR  02/11/2018  - Visit   67 year old male patient completing follow-up with our office today after completing pulmonary function test.  Unfortunately patient was unable to complete DLCO due to inspiratory cough.  02/11/2018- pulmonary function test.  FVC 2.9 (67% predicted), postbronchodilator ratio 70, FEV1 55, patient had difficulty completing pulmonary function test due to repeated coughing  Patient presents today with his spouse for further discussion about probable IPF diagnosis based off of CT.   Tests:   01/20/2018-CT chest high-res-spectrum of findings compatible with basilar predominant fibrotic interstitial lung disease with mild honeycombing these findings are new compared to November 2014 chest radiograph.  Findings are consistent with UIP.   01/29/2018- hypersensitivity pneumonitis, CK, aldolase, CCP, Rh, ANA, ANA titer-trace positive ANA but otherwise autoimmune labs normal  01/29/2018- integrative  comprehensive ILD questionnaire [place] Integrated Comprehensive ILD Questionnaire  Symptoms: He reports shortness of breath onset gradually for the last 1 year.  It is getting worse with time.  He does have episodic dyspnea.  When he does shopping it is level 1.  When he walks on a level ground with others of his age at this level 1 walking up stairs at this level 1 but walking at his own pace at this level 2 and taking a shower at level 3.  He does have associated arthralgia and cough.  He is not sure when the cough started.  It is present at all positions since it started it is the same as moderate in intensity.  He does cough at night.  He does bring up phlegm which is clear in color.  It gets worse when he lies down and it affects his voice and he does clear his throat.  He does not feel a tickle.  There is no wheezing.   Past Medical History : This is positive for major depression but he circled no for asthma, COPD.  Surgical no for heart failure.  Surgical no for collagen vascular disease with sleep apnea.  He did not answer for acid reflux which he does have..  Denies diabetes or thyroid disease or heart disease or pulmonary embolism or pneumonia   ROS:  .  Positive for difficulty swallowing due to his esophageal stricture history.  Does have nausea and acid reflux and snoring.  Otherwise no vomiting is positive for soreness across the body after electroconvulsive therapy 2 days ago.   FAMILY HISTORY of LUNG DISEASE: Denies family history of any pulmonary  fibrosis COPD or asthma sarcoidosis   EXPOSURE HISTORY: Denies smoking cigarettes or tobacco or marijuana or cocaine.  No intravenous drug abuse.   HOME and HOBBY DETAILS : He did not list the age of the home but is lived in this home [place].  There is no mold in the house of mildew.  The house is not damp.  Does not use a CPAP mask.  No nebulizer machine.  No use of Jacuzzi.  Nose fountain in the  house.  No pet birds or gerbils.  Does not use feather pillows.  He is not sure if there is mold in the [facility] duct system.  Does not play wind instruments.  He occasionally mows the lawn   OCCUPATIONAL HISTORY (122 questions) : Positive for social gardening.  Did report that he is used feather pillows and blankets in the past but otherwise negative including working in a dusty environment   PULMONARY TOXICITY HISTORY (27 items): Positive for cancer chemotherapy but he does not know the details   FENO:  No results found for: NITRICOXIDE  PFT: PFT Results Latest Ref Rng & Units 02/11/2018  FVC-Pre L 2.90  FVC-Predicted Pre % 67  FVC-Post L 2.52  FVC-Predicted Post % 58  Pre FEV1/FVC % % 86  Post FEV1/FCV % % 70  FEV1-Pre L 2.50  FEV1-Predicted Pre % 78  FEV1-Post L 1.77  TLC L 3.41  TLC % Predicted % 50  RV % Predicted % 11    Imaging: Dg Chest 2 View  Result Date: 01/16/2018 CLINICAL DATA:  Preoperative evaluation EXAM: CHEST - 2 VIEW COMPARISON:  Chest radiograph 12/31/2012 FINDINGS: Stable cardiac and mediastinal contours. Interval development of diffuse bilateral coarse interstitial pulmonary opacities. No pleural effusion or pneumothorax. Thoracic spine degenerative changes. IMPRESSION: Interval development of diffuse bilateral coarse interstitial pulmonary opacities concerning for chronic interstitial lung disease. Recommend further evaluation with high-resolution chest CT. Electronically Signed   By: Lovey Newcomer M.D.   On: 01/16/2018 11:07   Ct Chest High Resolution  Result Date: 01/20/2018 CLINICAL DATA:  Interstitial prominence on recent preoperative chest radiograph. EXAM: CT CHEST WITHOUT CONTRAST TECHNIQUE: Multidetector CT imaging of the chest was performed following the standard protocol without intravenous contrast. High resolution imaging of the lungs, as well as inspiratory and expiratory imaging, was performed. COMPARISON:  01/15/2018 chest radiograph. FINDINGS:  Cardiovascular: Normal heart size. No significant pericardial effusion/thickening. Three-vessel coronary atherosclerosis. Atherosclerotic nonaneurysmal thoracic aorta. Normal caliber pulmonary arteries. Mediastinum/Nodes: No discrete thyroid nodules. Unremarkable esophagus. No axillary adenopathy. Right paratracheal adenopathy up to 1.6 cm (series 2/image 47). Enlarged 1.1 cm subcarinal node (series 2/image 66). AP window adenopathy measuring up to 1.3 cm (series 2/image 52). Symmetric mild bilateral hilar adenopathy, poorly delineated on these noncontrast images. Lungs/Pleura: No pneumothorax. No pleural effusion. No acute consolidative airspace disease or lung masses. Two scattered subpleural solid pulmonary nodules, largest 6 mm in the posterior left upper lobe (series 3/image 65). There is extensive patchy confluent subpleural reticulation and ground-glass attenuation throughout both lungs with associated mild traction bronchiectasis, architectural distortion and volume loss. There is mild honeycombing at the extreme lung bases bilaterally. No significant air trapping on the expiration sequence. There is a mild basilar gradient to these findings. Upper abdomen: Small hiatal hernia. Small simple liver cysts, largest 1.4 cm in the right lower lobe. Musculoskeletal: No aggressive appearing focal osseous lesions. Moderate thoracic spondylosis. IMPRESSION: 1. Spectrum of findings compatible with basilar predominant fibrotic interstitial lung  disease with mild honeycombing. These findings are new compared to 12/31/2012 chest radiograph. Findings are consistent with UIP per consensus guidelines: Diagnosis of Idiopathic Pulmonary Fibrosis: An Official ATS/ERS/JRS/ALAT Clinical Practice Guideline. [place], Iss 5, [phone], Oct 25 2016. 2. Two solid pulmonary nodules, largest 6 mm. Non-contrast chest CT at 3-6 months is recommended. If the nodules are stable at time of repeat CT, then future CT  at 18-24 months (from today's scan) is considered optional for low-risk patients, but is recommended for high-risk patients. This recommendation follows the consensus statement: Guidelines for Management of Incidental Pulmonary Nodules Detected on CT Images: From the Fleischner Society 2017; Radiology 2017; 284:228-243. 3. Mild-to-moderate mediastinal bilateral hilar adenopathy, nonspecific, probably reactive. 4. Three-vessel coronary atherosclerosis. 5. Small hiatal hernia. Aortic Atherosclerosis (ICD10-I70.0). Electronically Signed   By: Ilona Sorrel M.D.   On: 01/20/2018 13:54      Specialty Problems      Pulmonary Problems   ILD (interstitial lung disease) ([place])    01/20/2018-CT chest high-res-spectrum of findings compatible with basilar predominant fibrotic interstitial lung disease with mild honeycombing these findings are new compared to November 2014 chest radiograph.  Findings are consistent with UIP.   01/29/2018- hypersensitivity pneumonitis, CK, aldolase, CCP, Rh, ANA, ANA titer-trace positive ANA but otherwise autoimmune labs normal  01/29/2018- integrative comprehensive ILD questionnaire [place] Integrated Comprehensive ILD Questionnaire  Symptoms: He reports shortness of breath onset gradually for the last 1 year.  It is getting worse with time.  He does have episodic dyspnea.  When he does shopping it is level 1.  When he walks on a level ground with others of his age at this level 1 walking up stairs at this level 1 but walking at his own pace at this level 2 and taking a shower at level 3.  He does have associated arthralgia and cough.  He is not sure when the cough started.  It is present at all positions since it started it is the same as moderate in intensity.  He does cough at night.  He does bring up phlegm which is clear in color.  It gets worse when he lies down and it affects his voice and he does clear his throat.  He does not feel a tickle.  There is no  wheezing.   Past Medical History : This is positive for major depression but he circled no for asthma, COPD.  Surgical no for heart failure.  Surgical no for collagen vascular disease with sleep apnea.  He did not answer for acid reflux which he does have..  Denies diabetes or thyroid disease or heart disease or pulmonary embolism or pneumonia   ROS:  .  Positive for difficulty swallowing due to his esophageal stricture history.  Does have nausea and acid reflux and snoring.  Otherwise no vomiting is positive for soreness across the body after electroconvulsive therapy 2 days ago.   FAMILY HISTORY of LUNG DISEASE: Denies family history of any pulmonary fibrosis COPD or asthma sarcoidosis   EXPOSURE HISTORY: Denies smoking cigarettes or tobacco or marijuana or cocaine.  No intravenous drug abuse.   HOME and HOBBY DETAILS : He did not list the age of the home but is lived in this home for 25 years [place] setting.  There is no mold in the house of mildew.  The house is not damp.  Does not use a CPAP mask.  No nebulizer machine.  No use of Jacuzzi.  Nose fountain in the house.  No pet birds or gerbils.  Does not use feather pillows.  He is not sure if there is mold in the [facility] duct system.  Does not play wind instruments.  He occasionally mows the lawn   OCCUPATIONAL HISTORY (122 questions) : Positive for social gardening.  Did report that he is used feather pillows and blankets in the past but otherwise negative including working in a dusty environment   PULMONARY TOXICITY HISTORY (27 items): Positive for cancer chemotherapy but he does not know the details  02/11/2018- pulmonary function test.  FVC 2.9 (67% predicted), postbronchodilator ratio 70, FEV1 55, patient had difficulty completing pulmonary function test due to repeated coughing         Allergies  Allergen Reactions  . Aleve [Naproxen Sodium] Anaphylaxis and Other (See Comments)    GI UPSET Patient does not  remember anaphylaxis  . Hyzaar [Losartan Potassium-Hctz] Other (See Comments)    Felt bad   . Lisinopril Cough  . Demerol [Meperidine] Rash         Immunization History  Administered Date(s) Administered  . Influenza Inj Mdck Quad Pf 11/09/2017  . Pneumococcal Conjugate-13 10/26/2015  . Pneumococcal Polysaccharide-23 10/29/2016   Up-to-date  Past Medical History:  Diagnosis Date  . Arthritis    NECK  . Cough    NON-PRODUCTIVE  . Depressive disorder    follow by dr [facility]  . Diverticulosis   . Diverticulosis of colon   . Generalized anxiety disorder   . GERD (gastroesophageal reflux disease)    WATCHES DIET  . Hemorrhoid   . High blood pressure   . History of colon polyps   . History of kidney stones   . Microscopic hematuria   . Nocturia   . Panic attacks   . Prostate cancer ([place])   . Sinus infection   . Tinnitus    LEFT EAR -- CHRONIC    Tobacco History: Social History   Tobacco Use  Smoking Status Never Smoker  Smokeless Tobacco Never Used   Counseling given: Not Answered  Continue to not smoke  Outpatient Encounter Medications as of 02/11/2018  Medication Sig  . benzonatate (TESSALON) 200 MG capsule Take 1 capsule (200 mg total) by mouth 3 (three) times daily as needed for cough.  . clonazePAM (KLONOPIN) 1 MG tablet Take 1 tablet at 8 am, 1/2 tablet at 2 pm and 1 tablet at bedtime for anxiety. (Patient taking differently: Take 1 mg by mouth 3 (three) times daily. Pt states "I usually take 1 tablet three times a day")  . mirtazapine (REMERON) 30 MG tablet TK 1 T PO QHS  . nortriptyline (PAMELOR) 50 MG capsule Take 50 mg by mouth daily at 8 pm.   . pantoprazole (PROTONIX) 40 MG tablet TAKE 1 TABLET BY MOUTH DAILY   No facility-administered encounter medications on file as of 02/11/2018.      Review of Systems  Review of Systems  Constitutional: Positive for fatigue. Negative for activity change, chills, fever and unexpected weight  change.  HENT: Negative for postnasal drip, rhinorrhea and sore throat.   Eyes: Negative.   Respiratory: Positive for cough (inspiratory pattern ) and shortness of breath (with exertion ). Negative for wheezing.   Cardiovascular: Negative for chest pain and palpitations.  Gastrointestinal: Negative for constipation, diarrhea, nausea and vomiting.  Endocrine: Negative.   Musculoskeletal: Negative.   Skin: Negative.   Neurological: Negative for dizziness and headaches.  Psychiatric/Behavioral: Negative.  Negative  for dysphoric mood. The patient is not nervous/anxious.   All other systems reviewed and are negative.    Physical Exam  BP 106/72 (BP Location: Left Arm, Cuff Size: Normal)   Pulse 93   Ht 5\' 9"  (1.753 m)   Wt 190 lb 3.2 oz (86.3 kg)   SpO2 95%   BMI 28.09 kg/m   Wt Readings from Last 5 Encounters:  02/11/18 190 lb 3.2 oz (86.3 kg)  02/09/18 188 lb 3.2 oz (85.4 kg)  01/29/18 189 lb (85.7 kg)  01/07/18 182 lb (82.6 kg)  12/28/17 182 lb 12.8 oz (82.9 kg)     Physical Exam  Constitutional: He is oriented to person, place, and time and well-developed, well-nourished, and in no distress. No distress.  HENT:  Head: Normocephalic and atraumatic.  Right Ear: Hearing, tympanic membrane, external ear and ear canal normal.  Left Ear: Hearing, tympanic membrane, external ear and ear canal normal.  Nose: Mucosal edema present. Right sinus exhibits no maxillary sinus tenderness and no frontal sinus tenderness. Left sinus exhibits no maxillary sinus tenderness and no frontal sinus tenderness.  Mouth/Throat: Uvula is midline and oropharynx is clear and moist. No oropharyngeal exudate.  +PND  Eyes: Pupils are equal, round, and reactive to light.  Neck: Normal range of motion. Neck supple.  Cardiovascular: Normal rate, regular rhythm and normal heart sounds.  Pulmonary/Chest: Effort normal. No accessory muscle usage. No respiratory distress. He has no decreased breath sounds. He  has no wheezes. He has no rhonchi. He has rales (BB velcro crackles ).  Musculoskeletal: Normal range of motion.        General: No edema.  Lymphadenopathy:    He has no cervical adenopathy.  Neurological: He is alert and oriented to person, place, and time. Gait normal.  Skin: Skin is warm and dry. He is not diaphoretic. No erythema.  Psychiatric: Mood, memory, affect and judgment normal.  Nursing note and vitals reviewed.    Lab Results:  CBC    Component Value Date/Time   WBC 8.0 01/15/2018 1323   RBC 5.00 01/15/2018 1323   HGB 15.2 01/15/2018 1323   HCT 46.1 01/15/2018 1323   PLT 306 01/15/2018 1323   MCV 92.2 01/15/2018 1323   MCH 30.4 01/15/2018 1323   MCHC 33.0 01/15/2018 1323   RDW 14.0 01/15/2018 1323   LYMPHSABS 0.9 01/26/2013 1102   MONOABS 0.2 01/26/2013 1102   EOSABS 0.1 01/26/2013 1102   BASOSABS 0.0 01/26/2013 1102    BMET    Component Value Date/Time   NA 137 01/15/2018 1323   K 4.5 01/15/2018 1323   CL 102 01/15/2018 1323   CO2 28 01/15/2018 1323   GLUCOSE 90 01/15/2018 1323   BUN 16 01/15/2018 1323   CREATININE 1.18 01/15/2018 1323   CALCIUM 9.0 01/15/2018 1323   GFRNONAA >60 01/15/2018 1323   GFRAA >60 01/15/2018 1323    BNP No results found for: BNP  ProBNP No results found for: PROBNP    Assessment & Plan:   67 year old male patient completing follow-up with our office today.  I believe after pulmonary function test that is okay to proceed forward with starting anti-fibrotic therapy for management of patient's IPF.  Will discuss patient's case with Dr. Chase Caller.  We will also refer patient to pulmonary rehab.  Patient does want to start with anti-fibrotic's.   ILD (interstitial lung disease) ([place]) Will discuss your case with Dr. Chase Caller  I will call you about starting medications   I  will refer you to pulmonary rehab  Idiopathic Pulmonary Fibrosis Support Group  >>> [email or web address] >>>Contact Name: Marlane Mingle  Keep follow  up with Dr. Sandy Salaam, NP 02/11/2018   This appointment was 32 minutes along with over 50% of the time in direct face-to-face patient care, assessment, plan of care, and follow-up.

## 2018-02-11 ENCOUNTER — Ambulatory Visit (INDEPENDENT_AMBULATORY_CARE_PROVIDER_SITE_OTHER): Payer: PPO | Admitting: Pulmonary Disease

## 2018-02-11 ENCOUNTER — Ambulatory Visit (INDEPENDENT_AMBULATORY_CARE_PROVIDER_SITE_OTHER): Payer: PPO | Admitting: Internal Medicine

## 2018-02-11 ENCOUNTER — Telehealth: Payer: Self-pay | Admitting: Pulmonary Disease

## 2018-02-11 ENCOUNTER — Encounter: Payer: Self-pay | Admitting: Pulmonary Disease

## 2018-02-11 VITALS — BP 106/72 | HR 93 | Ht 69.0 in | Wt 190.2 lb

## 2018-02-11 DIAGNOSIS — Z5181 Encounter for therapeutic drug level monitoring: Secondary | ICD-10-CM

## 2018-02-11 DIAGNOSIS — J849 Interstitial pulmonary disease, unspecified: Secondary | ICD-10-CM

## 2018-02-11 LAB — PULMONARY FUNCTION TEST
FEF 25-75 Post: 0.86 L/sec
FEF 25-75 Pre: 2.93 L/sec
FEF2575-%Change-Post: -70 %
FEF2575-%Pred-Post: 34 %
FEF2575-%Pred-Pre: 118 %
FEV1-%Change-Post: -29 %
FEV1-%Pred-Post: 55 %
FEV1-%Pred-Pre: 78 %
FEV1-Post: 1.77 L
FEV1-Pre: 2.5 L
FEV1FVC-%Change-Post: -18 %
FEV1FVC-%Pred-Pre: 116 %
FEV6-%Change-Post: -13 %
FEV6-%Pred-Post: 61 %
FEV6-%Pred-Pre: 70 %
FEV6-POST: 2.52 L
FEV6-Pre: 2.9 L
FEV6FVC-%PRED-POST: 105 %
FEV6FVC-%Pred-Pre: 105 %
FVC-%Change-Post: -13 %
FVC-%PRED-PRE: 67 %
FVC-%Pred-Post: 58 %
FVC-Post: 2.52 L
FVC-Pre: 2.9 L
PRE FEV6/FVC RATIO: 100 %
Post FEV1/FVC ratio: 70 %
Post FEV6/FVC ratio: 100 %
Pre FEV1/FVC ratio: 86 %
RV % PRED: 11 %
RV: 0.27 L
TLC % pred: 50 %
TLC: 3.41 L

## 2018-02-11 NOTE — Assessment & Plan Note (Addendum)
Will discuss your case with Dr. Chase Caller  I will call you about starting medications   I will refer you to pulmonary rehab  Idiopathic Pulmonary Fibrosis Support Group  >>> [email or web address] >>>Contact Name: Marlane Mingle  Keep follow up with Dr. Chase Caller

## 2018-02-11 NOTE — Telephone Encounter (Signed)
02/11/18 1548  Attempted to contact the patient today.  Discussed patient's case with Dr. Chase Caller today.  Believe the patient could be candidate for either Esbriet or Ofev.  It comes down to whether or not patient has a preference with nausea (typically with Esbriet) or diarrhea (typically with 0FEV).  Also depends on if patient has a preference with dosing schedule as of OFEV is twice a day and Esbriet is 3 times a day.  Patient will need blood work based on the medication that he starts.  Patient to keep follow-up with Dr. Chase Caller in January/2019.  Idiopathic Pulmonary Fibrosis Support Group  >>> [email or web address] >>>Contact Name: Marlane Mingle  I will also refer the patient to pulmonary rehab.  Wyn Quaker FNP

## 2018-02-11 NOTE — Patient Instructions (Addendum)
Will discuss your case with Dr. Chase Caller  I will call you about starting medications   Keep follow up with Dr. Chase Caller   It is flu season:   >>>Remember to be washing your hands regularly, using hand sanitizer, be careful to use around herself with has contact with people who are sick will increase her chances of getting sick yourself. >>> Best ways to protect herself from the flu: Receive the yearly flu vaccine, practice good hand hygiene washing with soap and also using hand sanitizer when available, eat a nutritious meals, get adequate rest, hydrate appropriately   Please contact the office if your symptoms worsen or you have concerns that you are not improving.   Thank you for choosing [facility] for your healthcare, and for allowing [place] to partner with you on your healthcare journey. I am thankful to be able to provide care to you today.   Wyn Quaker FNP-C

## 2018-02-11 NOTE — Progress Notes (Signed)
PFT completed 02/11/18

## 2018-02-11 NOTE — Progress Notes (Signed)
Discussed results with patient in office.  Nothing further is needed at this time.  Brian Mack FNP  

## 2018-02-12 ENCOUNTER — Telehealth (HOSPITAL_COMMUNITY): Payer: Self-pay

## 2018-02-12 NOTE — Telephone Encounter (Signed)
I have started the application for pt to begin OFEV. What I need to know, are we going to start pt on the OFEV 150mg  dose or are we going to start pt on the OFEV 100mg  dose.  [name], please advise if you are okay with deciding which dose pt should be started on or if you want me to send this to MR? Thanks!

## 2018-02-12 NOTE — Telephone Encounter (Signed)
Pt's OFEV application has been faxed to [facility].   Called and spoke with pt letting him know the application was started. Stated to him it could take up to at least a week or two before he would start on the med as we have to do a PA before they would approve the medication. Pt expressed understanding. I stated to him I would keep him updated with what was happening and if we needed anything from him, I would call him to let him know. Will keep encounter open until all has been taken care of for pt.

## 2018-02-12 NOTE — Telephone Encounter (Signed)
02/12/18 1339  I was able to reach the patient at home to discuss medication options for his IPF.  Patient would like to proceed forward with OFEV as patient would prefer having diarrhea or loose stools over the nausea with Esbriet.  Patient also prefers the dosing pattern of 0FEV.  We will go ahead and start the process for applying for 0FEV.  I let the patient know that I have referred him to pulmonary rehab I also provided the IPF support group email and contact information to the patient.  Patient reports he will follow-up with them.  Lauren, we need to check immunization status and update records from patients pcp.   Will route to Ander Purpura and Raquel Sarna to start [facility] paperwork  Wyn Quaker FNP

## 2018-02-12 NOTE — Telephone Encounter (Signed)
Referral received from [facility] for Pulmonary Rehab with diagnosis of ILD. Clinical review of pt follow up appt on 02/11/18 Pulmonary office note with [facility] NP.   Pt appropriate for scheduling for Pulmonary rehab.  Will forward to support staff for scheduling and verification of insurance eligibility/benefits with pt  consent. Joycelyn Man, RN, BSN Cardiac and Pulmonary Rehab Nurse

## 2018-02-12 NOTE — Telephone Encounter (Signed)
Noted. Will fax paperwork once it has been signed.

## 2018-02-12 NOTE — Telephone Encounter (Signed)
Form signed and given to [facility].   Pt will need LFTs at baseline before starting.   Wyn Quaker FNP

## 2018-02-12 NOTE — Telephone Encounter (Signed)
Nevermind. Will do 150mg . Spoke with MR.   [name]

## 2018-02-12 NOTE — Telephone Encounter (Signed)
Can start with 100mg  and work up to 150mg  would be my thought process.   [name]

## 2018-02-15 NOTE — Telephone Encounter (Signed)
Thank you for your work on this.   Eduardo Fuentes

## 2018-02-18 ENCOUNTER — Telehealth: Payer: Self-pay | Admitting: Internal Medicine

## 2018-02-18 NOTE — Telephone Encounter (Signed)
Looked at pt's OFEV enrollment application and saw that in step 5 for the bridge program was selected and step 4 for the Rx information was not selected.  The step 4 on the enrollment application had not been signed. Spoke with Ashby Dawes, pharmacist with [facility] stating to her this information on what happened. I was able to give verbal Rx information to jeanine in regards to pt's OFEV.  Stated to [place] that the dose for pt's OFEV is 150mg  and we wanted to do the Rx with 11 RF for pt. I provided her with Winnifred Friar NPI as he was the NP who signed enrollment application due to MR being out of office and I also provided Jeanine with MR's NPI. That way they can be able to get med taken care of for pt. Nothing further needed.

## 2018-02-19 DIAGNOSIS — J320 Chronic maxillary sinusitis: Secondary | ICD-10-CM | POA: Diagnosis not present

## 2018-02-19 DIAGNOSIS — J322 Chronic ethmoidal sinusitis: Secondary | ICD-10-CM | POA: Diagnosis not present

## 2018-02-19 DIAGNOSIS — I10 Essential (primary) hypertension: Secondary | ICD-10-CM | POA: Diagnosis not present

## 2018-02-19 DIAGNOSIS — J370 Chronic laryngitis: Secondary | ICD-10-CM | POA: Diagnosis not present

## 2018-02-22 ENCOUNTER — Telehealth (HOSPITAL_COMMUNITY): Payer: Self-pay

## 2018-02-22 NOTE — Telephone Encounter (Signed)
Pt insurance is active and benefits verified through [facility]. Co-pay $10.00, DED $0.00/$0.00 met, out of pocket $3,100.00/$0.00 met, co-insurance 0%. No pre-authorization required. [place]/[facility], 02/22/18 @ 2:16PM, REF# [id number]

## 2018-02-22 NOTE — Telephone Encounter (Signed)
Attempted to call patient in regards to Pulmonary Rehab - LM on VM °

## 2018-02-23 NOTE — Telephone Encounter (Signed)
Called both numbers provided. Unable to reach patient, unable to leave voicemail as phone cuts off.

## 2018-02-23 NOTE — Telephone Encounter (Signed)
Thank you for the follow-up and work on this.  Can we update the patient let them know that this is currently being worked out?  If unable to receive assistance can we follow-up with the 0FEV drug rep about different patient assistance programs to help support the patient to the medication started?  Wyn Quaker FNP

## 2018-02-23 NOTE — Telephone Encounter (Signed)
called [facility] to check on status of pt's OFEV. Spoke with Carolee Rota, pharmacist who stated pt has a very high copay and due to this, they have been trying to get pt help with copay assistance.  As of 02/22/18, they are still trying to work on copay assistance due to pt's current copay being over $2000. Pt's application has been sent over to the copay assistance dept to see if they can be able to get pt's copay down.

## 2018-02-23 NOTE — Telephone Encounter (Signed)
Do we have any follow-up on this patient and his medication start?  He will need liver function test prior to starting the medication?  Wyn Quaker, FNP

## 2018-02-26 NOTE — Telephone Encounter (Signed)
Called and spoke with patient, advised him of message below. He verbalized understanding. Will await response from copay assistance.

## 2018-02-26 NOTE — Telephone Encounter (Signed)
Thank you for the update.  Thank you for contacting patient.  Eduardo Fuentes

## 2018-03-05 ENCOUNTER — Telehealth: Payer: Self-pay | Admitting: Internal Medicine

## 2018-03-05 NOTE — Telephone Encounter (Signed)
That is correct. Pt is scheduled for OV with MR 03/16/2018. I mislooked at the date.  Called and spoke with pt stating to him that we will keep his current scheduled appt with MR on 03/16/2018 but stated to him we need him to come to get labwork done so we can make sure things are doing fine with the start of OFEV.  Pt expressed understanding. Order has been placed for labwork. Nothing further needed.

## 2018-03-05 NOTE — Telephone Encounter (Signed)
Called and spoke with pt. Pt stated he began OFEV 02/27/2018. Pt is currently scheduled for an appt with MR at 2pm 04/16/2018.  [name], is it okay for pt to just come in 4 weeks for labwork so we can do LFT check and for him to keep the current scheduled OV with MR 2/21?

## 2018-03-05 NOTE — Telephone Encounter (Signed)
Please see phone note from 02/11/18 as this is a duplicate encounter. Closing encounter.

## 2018-03-05 NOTE — Telephone Encounter (Signed)
Eduardo Fuentes,   As far as you know is there been any response regarding co-pay assistance for this patient's medications?  [name]

## 2018-03-05 NOTE — Telephone Encounter (Signed)
Thank you for contacting the patient.  The patient will need to present to [facility] lab for baseline lab work (LFTs) and will need to have an office visit in 4 weeks after starting OFEV.  Please place the order for the patient to have the lab work.  When you reach the patient please schedule him for an office visit.  Another option is patient could wait to start 0FEV until he sees Dr. Chase Caller or get the blood work done at that office visit.  Wyn Quaker, FNP

## 2018-03-05 NOTE — Telephone Encounter (Signed)
It shows in the computer system that I am looking at the patient is scheduled for OV with MR January/21/2020.    I would just recommend the patient to go get LFTs done at [facility] location since he started the medication.  Please place the order under my name.  Wyn Quaker, FNP

## 2018-03-05 NOTE — Telephone Encounter (Signed)
[place] and spoke with Eduardo Fuentes to check of status of pt beginning medication. Per Eduardo Fuentes, the medication was delivered to pt last week.  Called pt to follow up on actual start date of OFEV. Unable to reach pt but left a message for pt to return my call so that way I can see what date he actually began the [place].  Routing to Eduardo Quaker, Eduardo Fuentes as an Eduardo Fuentes so that he is made aware that pt has had the OFEV shipped to his address. Will update Eduardo Fuentes once I know from pt of the exact start date of the med.

## 2018-03-08 ENCOUNTER — Other Ambulatory Visit (INDEPENDENT_AMBULATORY_CARE_PROVIDER_SITE_OTHER): Payer: PPO

## 2018-03-08 DIAGNOSIS — J849 Interstitial pulmonary disease, unspecified: Secondary | ICD-10-CM

## 2018-03-08 DIAGNOSIS — Z5181 Encounter for therapeutic drug level monitoring: Secondary | ICD-10-CM | POA: Diagnosis not present

## 2018-03-08 LAB — HEPATIC FUNCTION PANEL
ALBUMIN: 4 g/dL (ref 3.5–5.2)
ALK PHOS: 104 U/L (ref 39–117)
ALT: 15 U/L (ref 0–53)
AST: 23 U/L (ref 0–37)
Bilirubin, Direct: 0.1 mg/dL (ref 0.0–0.3)
Total Bilirubin: 0.4 mg/dL (ref 0.2–1.2)
Total Protein: 7.2 g/dL (ref 6.0–8.3)

## 2018-03-08 NOTE — Progress Notes (Signed)
Labs are stable.  Continue on 0FEV.  Keep follow-up with Dr. Chase Caller

## 2018-03-10 ENCOUNTER — Telehealth (HOSPITAL_COMMUNITY): Payer: Self-pay

## 2018-03-10 NOTE — Telephone Encounter (Signed)
Called and spoke with pt in regards to pulmonary rehab, pt stated he would like to have to think about it.  Will follow up with the pt in a week if he does not call back.

## 2018-03-11 ENCOUNTER — Ambulatory Visit: Payer: PPO | Admitting: Podiatry

## 2018-03-11 ENCOUNTER — Encounter: Payer: Self-pay | Admitting: Podiatry

## 2018-03-11 VITALS — BP 109/77 | HR 109 | Resp 16

## 2018-03-11 DIAGNOSIS — L600 Ingrowing nail: Secondary | ICD-10-CM | POA: Diagnosis not present

## 2018-03-11 MED ORDER — NEOMYCIN-POLYMYXIN-HC 1 % OT SOLN: OTIC | 0 refills | Status: DC

## 2018-03-11 NOTE — Progress Notes (Signed)
   Subjective:    Patient ID: Eduardo Fuentes, male    DOB: 20-Feb-1951, 68 y.o.   MRN: [id number]  HPI    Review of Systems  All other systems reviewed and are negative.      Objective:   Physical Exam        Assessment & Plan:

## 2018-03-11 NOTE — Patient Instructions (Signed)

## 2018-03-16 ENCOUNTER — Ambulatory Visit: Payer: PPO | Admitting: Internal Medicine

## 2018-03-16 ENCOUNTER — Encounter: Payer: Self-pay | Admitting: Internal Medicine

## 2018-03-16 ENCOUNTER — Ambulatory Visit: Payer: Self-pay | Admitting: Internal Medicine

## 2018-03-16 VITALS — BP 126/70 | HR 110 | Ht 69.0 in | Wt 190.0 lb

## 2018-03-16 DIAGNOSIS — Z5181 Encounter for therapeutic drug level monitoring: Secondary | ICD-10-CM

## 2018-03-16 DIAGNOSIS — J84112 Idiopathic pulmonary fibrosis: Secondary | ICD-10-CM | POA: Diagnosis not present

## 2018-03-16 NOTE — Patient Instructions (Addendum)
ICD-10-CM   1. IPF (idiopathic pulmonary fibrosis) ([place]) J84.112   2. Encounter for therapeutic drug monitoring Z51.81      Diagnosis is IPF - unknown reason but likely hiatal hernia played ar ole  - diagnosis given 02/11/18  Glad you are on ofev since 02/27/2018 and so far tolerating it well and LFT normnal 03/08/2018  Plan  -Definitely encourage her to attend pulmonary rehabilitation -In the future we can discuss a little bit more about patient support group in research trials -Currently disease is mild-moderate in severity -Continue acid reflux treatment -Continue nintedanib 150 mg twice daily monitoring for side effects and I am glad that you have no side effects at this point -Pulmonary fibrosis foundation related education material about IPF given to you  Follow-up 3 weeks LFT check 6 weeks with myself or nurse practitioner -okay to be seen in regular clinic ; main focus is to see if nintedanib tolerance is going well  -Liver function test at the time of follow-up and simple walk test

## 2018-03-16 NOTE — Addendum Note (Signed)
Addended by: Lorretta Harp on: 03/16/2018 02:54 PM   Modules accepted: Orders

## 2018-03-16 NOTE — Progress Notes (Addendum)
HPI IOV 01/29/2018  Chief Complaint  Patient presents with  . Pulmonary Consult    self referral, abnormal CT, ? ILD from CT scan, SOB with exertion     Eduardo Fuentes presents self-referral for interstitial lung disease.  His wife Eduardo Fuentes used to be seen by me some years ago when she had surgical lung biopsy for ruling out interstitial lung disease which we did.  Patient is a non-smoker but has long suffered from acid reflux and esophageal stenosis.  He also has a sliding hiatal hernia.  He says since spring 2019 he has had insidious onset of shortness of breath that is progressive associated with some cough.  During this time he is also had worsening endogenous depression.  He was seeing a psychiatrist multiple antidepressants have failed.  Electroconvulsive therapy has been recommended and he had one on January 27, 2018 2 days ago.  During this time a chest x-ray was done as part of pre-electroconvulsive therapy evaluation and this suggested interstitial lung disease.  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  I agree with the radiologist.  The some amount of mediastinal adenopathy that is felt to be reactive.  There is also a sliding hiatal hernia.  It is described that these findings are new compared to 2014 chest x-ray.  At this point in time walking desaturation test he did not desaturate but dropped 4 points and pulse ox    [place] Integrated Comprehensive ILD Questionnaire  Symptoms: He reports shortness of breath onset gradually for the last 1 year.  It is getting worse with time.  He does have episodic dyspnea.  When he does shopping it is level 1.  When he walks on a level ground with others of his age at this level 1 walking up stairs at this level 1 but walking at his own pace at this level 2 and taking a shower at level 3.  He does have associated arthralgia and cough.  He is  not sure when the cough started.  It is present at all positions since it started it is the same as moderate in intensity.  He does cough at night.  He does bring up phlegm which is clear in color.  It gets worse when he lies down and it affects his voice and he does clear his throat.  He does not feel a tickle.  There is no wheezing.   Past Medical History : This is positive for major depression but he circled no for asthma, COPD.  Surgical no for heart failure.  Surgical no for collagen vascular disease with sleep apnea.  He did not answer for acid reflux which he does have..  Denies diabetes or thyroid disease or heart disease or pulmonary embolism or pneumonia   ROS:  .  Positive for difficulty swallowing due to his esophageal stricture history.  Does have nausea and acid reflux and snoring.  Otherwise no vomiting is positive for soreness across the body after electroconvulsive therapy 2 days ago.   FAMILY HISTORY of LUNG DISEASE: Denies family history of any pulmonary fibrosis COPD or asthma sarcoidosis   EXPOSURE HISTORY: Denies smoking cigarettes or tobacco or marijuana or cocaine.  No intravenous drug abuse.   HOME and HOBBY DETAILS : He did not list the age of the home but is lived in this home for 25 [place].  There is no mold in the house of mildew.  The house is not damp.  Does not use a CPAP mask.  No nebulizer machine.  No use of Jacuzzi.  Nose fountain in the house.  No pet birds or gerbils.  Does not use feather pillows.  He is not sure if there is mold in the [facility] duct system.  Does not play wind instruments.  He occasionally mows the lawn   OCCUPATIONAL HISTORY (122 questions) : Positive for social gardening.  Did report that he is used feather pillows and blankets in the past but otherwise negative including working in a dusty environment   [place] (27 items): Positive for cancer chemotherapy but he does not know the details      OV  03/16/2018  Subjective:  Patient ID: Eduardo Fuentes, male , DOB: 09/22/1950 , age 68 y.o. , MRN: [id number] , ADDRESS: [place] [id number]   03/16/2018 -   Chief Complaint  Patient presents with  . Follow-up    Pt started OFEV 02/27/2018.  Pt states he has been doing okay since last visit. Denies any current complaints with the OFEV. Pt states he does have SOB which is mainy with activities.   IPF [clinical diagnosis based on classic UIP scan negative serology and age and male gender; diagnosis given February 11, 2017]  HPI Eduardo Fuentes 68 y.o. -IPF follow-up.  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  He was started on nintedanib.  He started this on February 27, 2018.  Suffice tolerating it well without any problems.  He is debating about starting pulmonary rehabilitation.  Mostly does not want to pay $20 a week for this.  The other alternative is that he can go [facility] [employer] and exercise by himself without any co-pay but there is no trainer.  I asked him to look at rehab as a short-term thing with the help of a trainer and the cost of a trainer being $20 a week.  He then opened up to the idea of attending pulmonary rehabilitation.  He still and his wife both had questions about IPF and etiology.  Be on sliding hiatal hernia but not able to discover anything else.  His reflux is better controlled now with PPI.  In terms of her shortness of breath this is stable.  In terms of his cough this is stable.  He continues to deal with depression but is not suicidal or homicidal.  His walking desaturation test today shows stability.  Wife wanted some education material about IPF and nintedanib.    Results for Eduardo Fuentes (MRN [id number]) as of 03/16/2018 14:30  Ref. Range 02/11/2018 13:52  FVC-Pre Latest Units: L 2.90  FVC-%Pred-Pre Latest Units: % 67   Results for Eduardo Fuentes (MRN [id number]) as of 03/16/2018 14:30  Ref. Range 02/11/2018 13:52  TLC Latest Units: L  3.41  TLC % pred Latest Units: % 50      01/29/2018  03/16/2018   O2 used Room air rooom air  Number laps completed 3 3  Comments about pace 250 feet x 3 laps on POD B at [employer] pace  Resting Pulse Ox/HR 100% and 89/min 100% and 110/min  Final Pulse Ox/HR 96% and 109/min 97% and 118/min  Desaturated </= 88% no no  Desaturated <= 3% points Yes, 4poin Yes, 3 poitns  Got Tachycardic >/= 90/min yes yes  Symptoms at end of test  no Mild dyspnea  Miscellaneous comments no x   Results for EYTHAN, JAYNE (MRN [id number]) as of 03/16/2018 14:30  Ref. Range 01/29/2018 12:42  ASPERGILLUS FUMIGATUS Latest Ref Range: NEGATIVE  NEGATIVE  Pigeon Serum Latest Ref Range: NEGATIVE  NEGATIVE  Anti Nuclear Antibody(ANA) Latest Ref Range: NEGATIVE  POSITIVE (A)  ANA Pattern 1 Unknown Nuclear, Homogeneous (A)  ANA Titer 1 Latest Units: titer 4:33 (H)  Cyclic Citrullin Peptide Ab Latest Units: UNITS <16  RA Latex Turbid. Latest Ref Range: <14 IU/mL <14   ROS - per HPI  Results for CONARD, ALVIRA (MRN [id number]) as of 03/16/2018 14:54  Ref. Range 03/08/2018 11:50  AST Latest Ref Range: 0 - 37 U/L 23  ALT Latest Ref Range: 0 - 53 U/L 15  Total Protein Latest Ref Range: 6.0 - 8.3 g/dL 7.2     has a past medical history of Arthritis, Cough, Depressive disorder, Diverticulosis, Diverticulosis of colon, Generalized anxiety disorder, GERD (gastroesophageal reflux disease), Hemorrhoid, High blood pressure, History of colon polyps, History of kidney stones, Microscopic hematuria, Nocturia, Panic attacks, Prostate cancer ([place]), Sinus infection, and Tinnitus.   reports that he has never smoked. He has never used smokeless tobacco.  Past Surgical History:  Procedure Laterality Date  . COLONOSCOPY    . ESOPHAGOGASTRODUODENOSCOPY    . EXTRACORPOREAL SHOCK WAVE LITHOTRIPSY  YRS AGO  . PROSTATE BIOPSY    . RADIOACTIVE SEED IMPLANT N/A 02/02/2013   Procedure: RADIOACTIVE SEED IMPLANT;   Surgeon: Molli Hazard, MD;  Location: [facility];  Service: Urology;  Laterality: N/A;  . TONSILLECTOMY AND ADENOIDECTOMY  AS CHILD    Allergies  Allergen Reactions  . Aleve [Naproxen Sodium] Anaphylaxis and Other (See Comments)    GI UPSET Patient does not remember anaphylaxis  . Hyzaar [Losartan Potassium-Hctz] Other (See Comments)    Felt bad   . Lisinopril Cough  . Demerol [Meperidine] Rash         Immunization History  Administered Date(s) Administered  . Influenza Inj Mdck Quad Pf 11/09/2017  . Pneumococcal Conjugate-13 10/26/2015  . Pneumococcal Polysaccharide-23 10/29/2016    Family History  Problem Relation Age of Onset  . Dementia Mother   . Anxiety disorder Mother   . Alzheimer's disease Mother   . Heart disease Father   . Bone cancer Father   . Cancer Father        prostate  . Anxiety disorder Sister   . Colon cancer Neg Hx   . Esophageal cancer Neg Hx   . Rectal cancer Neg Hx   . Stomach cancer Neg Hx      Current Outpatient Medications:  .  clonazePAM (KLONOPIN) 1 MG tablet, Take 1 tablet at 8 am, 1/2 tablet at 2 pm and 1 tablet at bedtime for anxiety. (Patient taking differently: Take 1 mg by mouth 3 (three) times daily. Pt states "I usually take 1 tablet three times a day"), Disp: 75 tablet, Rfl: 0 .  hydrochlorothiazide (MICROZIDE) 12.5 MG capsule, Take 12.5 mg by mouth daily., Disp: , Rfl:  .  mirtazapine (REMERON) 30 MG tablet, TK 1 T PO QHS, Disp: , Rfl: 4 .  NEOMYCIN-POLYMYXIN-HYDROCORTISONE (CORTISPORIN) 1 % SOLN OTIC solution, Apply to bed after soaking, Disp: 10 mL, Rfl: 0 .  nortriptyline (PAMELOR) 50 MG capsule, Take 25 mg by mouth daily at 8 pm. , Disp: , Rfl:  .  OFEV 150 MG CAPS, Take 150 mg by mouth 2 (two) times daily. , Disp: ,  Rfl:  .  pantoprazole (PROTONIX) 40 MG tablet, TAKE 1 TABLET BY MOUTH DAILY, Disp: 30 tablet, Rfl: 6      Objective:   Vitals:   03/16/18 1405  BP: 126/70  Pulse: (!) 110  SpO2:  100%  Weight: 190 lb (86.2 kg)  Height: 5\' 9"  (1.753 m)    Estimated body mass index is 28.06 kg/m as calculated from the following:   Height as of this encounter: 5\' 9"  (1.753 m).   Weight as of this encounter: 190 lb (86.2 kg).  @WEIGHTCHANGE @  [employer]   03/16/18 1405  Weight: 190 lb (86.2 kg)     Physical Exam  General Appearance:    Alert, cooperative, no distress, appears stated age - no , Deconditioned looking - yes , OBESE  - no, Sitting on Wheelchair -  no  Head:    Normocephalic, without obvious abnormality, atraumatic  Eyes:    PERRL, conjunctiva/corneas clear,  Ears:    Normal TM's and external ear canals, both ears  Nose:   Nares normal, septum midline, mucosa normal, no drainage    or sinus tenderness. OXYGEN ON  - no . Patient is @ ra   Throat:   Lips, mucosa, and tongue normal; teeth and gums normal. Cyanosis on lips - no  Neck:   Supple, symmetrical, trachea midline, no adenopathy;    thyroid:  no enlargement/tenderness/nodules; no carotid   bruit or JVD  Back:     Symmetric, no curvature, ROM normal, no CVA tenderness  Lungs:     Distress - no , Wheeze no, Barrell Chest - no, Purse lip breathing - no, Crackles - yes at bsase   Chest Wall:    No tenderness or deformity.    Heart:    Regular rate and rhythm, S1 and S2 normal, no rub   or gallop, Murmur - no  Breast Exam:    NOT DONE  Abdomen:     Soft, non-tender, bowel sounds active all four quadrants,    no masses, no organomegaly. Visceral obesity - no  Genitalia:   NOT DONE  Rectal:   NOT DONE  Extremities:   Extremities - normal, Has Cane - no, Clubbing - no, Edema - no  Pulses:   2+ and symmetric all extremities  Skin:   Stigmata of Connective Tissue Disease - no  Lymph nodes:   Cervical, supraclavicular, and axillary nodes normal  Psychiatric:  Neurologic:   Pleasant - yes, Anxious - ys, Flat affect - YEs  CAm-ICU - neg, Alert and Oriented x 3 - yes, Moves all 4s - yes, Speech - normal,  Cognition - intact           Assessment:       ICD-10-CM   1. IPF (idiopathic pulmonary fibrosis) (HCC) J84.112 Hepatic function panel  2. Encounter for therapeutic drug monitoring Z51.81 Hepatic function panel       Plan:     Patient Instructions     ICD-10-CM   1. IPF (idiopathic pulmonary fibrosis) ([place]) J84.112   2. Encounter for therapeutic drug monitoring Z51.81      Diagnosis is IPF - unknown reason but likely hiatal hernia played ar ole  - diagnosis given 02/11/18  Glad you are on ofev since 02/27/2018 and so far tolerating it well and LFT normnal 03/08/2018  Plan  -Definitely encourage her to attend pulmonary rehabilitation -In the future we can discuss a little bit more about patient support group in research trials -Currently  disease is mild-moderate in severity -Continue acid reflux treatment -Continue nintedanib 150 mg twice daily monitoring for side effects and I am glad that you have no side effects at this point -Pulmonary fibrosis foundation related education material about IPF given to you  Follow-up 3 weeks LFT check 6 weeks with myself or nurse practitioner -okay to be seen in regular clinic ; main focus is to see if nintedanib tolerance is going well  -Liver function test at the time of follow-up and simple walk test  > 50% of this > 25 min visit spent in face to face counseling or coordination of care - by this undersigned MD - Dr Brand Males. This includes one or more of the following documented above: discussion of test results, diagnostic or treatment recommendations, prognosis, risks and benefits of management options, instructions, education, compliance or risk-factor reduction   SIGNATURE    Dr. Brand Males, M.D., F.C.C.P,  Pulmonary and Critical Care Medicine Staff Physician, [place] Director - Interstitial Lung Disease  Program  P[facility] [place], [place], [address]  Pager: [phone], If no answer or between  15:00h - 7:00h: call [phone] Telephone: [phone]  2:55 PM 03/16/2018

## 2018-03-17 NOTE — Progress Notes (Signed)
Subjective:   Patient ID: Eduardo Fuentes, male   DOB: 68 y.o.   MRN: [id number]   HPI Patient presents stating he is had a lot of pain in his right ingrown toenail and nail bed and states the left one at times bothersome.  States he is tried to trim and soak it without relief and patient does not currently smoke and likes to be active   Review of Systems  All other systems reviewed and are negative.       Objective:  Physical Exam Vitals signs and nursing note reviewed.  Constitutional:      Appearance: He is well-developed.  Pulmonary:     Effort: Pulmonary effort is normal.  Musculoskeletal: Normal range of motion.  Skin:    General: Skin is warm.  Neurological:     Mental Status: He is alert.     Neurovascular status intact muscle strength is adequate range of motion within normal limits with patient found to have incurvated right hallux medial border that is painful when pressed and make shoe gear difficult.  It is localized with no drainage and the left one is slightly ingrown but not to the same degree and it is painful with palpation.  Patient has good digital perfusion well oriented x3     Assessment:  Ingrown toenail deformity right over left hallux medial border with pain     Plan:  H&P condition reviewed and recommended nail correction.  Patient wants procedure understanding risk and I explained the surgery to him and he signed consent form and today I infiltrated the right hallux 60 mg like Marcaine mixture remove the medial border exposed matrix applied phenol 3 applications 30 seconds followed by alcohol lavage sterile dressing and gave instructions on soaks and advised to leave the dressing on 24 hours and take it off earlier if throbbing were to occur.  Wrote prescription for antibiotic drops and encouraged to call with questions

## 2018-03-19 ENCOUNTER — Encounter: Payer: Self-pay | Admitting: Podiatry

## 2018-03-19 ENCOUNTER — Ambulatory Visit: Payer: PPO | Admitting: Podiatry

## 2018-03-19 ENCOUNTER — Telehealth (HOSPITAL_COMMUNITY): Payer: Self-pay

## 2018-03-19 ENCOUNTER — Telehealth: Payer: Self-pay | Admitting: Internal Medicine

## 2018-03-19 DIAGNOSIS — L600 Ingrowing nail: Secondary | ICD-10-CM | POA: Diagnosis not present

## 2018-03-19 NOTE — Telephone Encounter (Signed)
Pt called and stated he had toe surgery and will call to schedule for PR, when he is healed.   Closed referral

## 2018-03-19 NOTE — Telephone Encounter (Signed)
Called pt and advised message from the provider. Pt understood and verbalized understanding. Nothing further is needed.    

## 2018-03-19 NOTE — Telephone Encounter (Signed)
Spoke with pt. States that he has been having stomach cramps since 03/16/2018. Pt believes that this is from [facility]. He is currently taking OFEV 150mg  BID. He has been on this medication for the last 2-3 weeks. Reports that the cramps come in the evening time and it feels like he needs to have a large bowel movement. Denies diarrhea, his bowel movements have been normal. Pt would like MR's recommendations.  MR - please advise. Thanks.

## 2018-03-19 NOTE — Patient Instructions (Signed)

## 2018-03-19 NOTE — Telephone Encounter (Signed)
Reduce ofev to 150mg  tablet once daily for a week and then restart back at full dose  Also take immodium Monday, wed, friday

## 2018-03-25 ENCOUNTER — Telehealth (HOSPITAL_COMMUNITY): Payer: Self-pay

## 2018-03-25 NOTE — Telephone Encounter (Signed)
Pt called and stated he is ready to start PR, pt will come in for orientation on 04/05/2018 @ 9AM and attend the 130PM class.  Mailed homework package.

## 2018-03-25 NOTE — Telephone Encounter (Signed)
Pt insurance is active and benefits verified through [employer]. Co-pay $10.00, DED $0.00/$0.00 met, out of pocket $3,100.00/$0.00 met, co-insurance 0%. No pre-authorization required. Willi/[employer], 03/25/2018 @ 3:35PM, REF# [id number]

## 2018-03-25 NOTE — Progress Notes (Signed)
Subjective:   Patient ID: Eduardo Fuentes, male   DOB: 68 y.o.   MRN: [id number]   HPI Patient presents with chronic ingrown toenail of the left big toe that is been very sore and making it hard to wear shoe gear comfortably.  States is been going on for a few weeks and it is increasingly hard for him to wear shoe gear   ROS      Objective:  Physical Exam  Neurovascular status was found to be intact with patient found to have good digital perfusion 1-5 of both feet.  The left hallux medial border is incurvated in the corner and it is sore when pressed and making it hard to wear shoe gear with no redness or drainage noted and I did note warm toe with adequate hair growth and good digital perfusion     Assessment:  Ingrown toenail deformity left hallux medial border chronic in nature     Plan:  H&P condition reviewed and recommended removal of the nail corner.  Explained procedure and risk and today I went ahead and I infiltrated the left hallux 60 mg like Marcaine mixture patient signed consent form understanding risk and I then went ahead sterile prep applied to the toe and using sterile instruments the medial border was excised the matrix exposed and chemical consisting of 3 applications phenol followed by alcohol lavage and flush administered.  Sterile dressing applied to the toe with instructions to leave dressing on 24 hours and to take it off early if any kind of throbbing were to occur.  Patient was given prescription for antibiotic solution and encouraged to call with questions concerns

## 2018-03-26 ENCOUNTER — Telehealth: Payer: Self-pay | Admitting: Internal Medicine

## 2018-03-26 ENCOUNTER — Telehealth (HOSPITAL_COMMUNITY): Payer: Self-pay

## 2018-03-26 NOTE — Telephone Encounter (Signed)
Called and spoke with patient regarding MR recommendations below. Pt verbalized understanding, okay to take ofev to 150mg  twice daily, and con't imodium per schedule Pt had not further concerns nor questions Nothing further needed.

## 2018-03-26 NOTE — Telephone Encounter (Signed)
Ok to increase ofev to 150mg  twice daily Continue immodium per schedule

## 2018-03-26 NOTE — Telephone Encounter (Signed)
Spoke with patient. He wanted to know if he can take the Ofev twice daily. Patient said he was told by Dr. Chase Caller during last office visit on 03/16/18 to only take once daily. He does not feel the reduced treatment is working for him. But knows there is concern regarding his stomach issues. Has been taking the anti diarrhea medication on Monday, Wednesday, Friday. Message routed to Dr. Chase Caller for review.   Dr. Chase Caller - please review and advise. The note and medication list show should be taken twice daily. Thank you.

## 2018-03-26 NOTE — Telephone Encounter (Signed)
Pt will attend the 1030 class time.

## 2018-04-05 ENCOUNTER — Encounter (HOSPITAL_COMMUNITY)
Admission: RE | Admit: 2018-04-05 | Discharge: 2018-04-05 | Disposition: A | Discharge status: Home / Self Care | Payer: PPO | Source: Ambulatory Visit | Attending: Internal Medicine | Admitting: Internal Medicine

## 2018-04-05 VITALS — BP 106/85 | HR 114 | Ht 69.0 in | Wt 193.3 lb

## 2018-04-05 DIAGNOSIS — J849 Interstitial pulmonary disease, unspecified: Secondary | ICD-10-CM

## 2018-04-05 NOTE — Progress Notes (Addendum)
Eduardo Fuentes 68 y.o. male Pulmonary Rehab Orientation Note Patient arrived today in Cardiac and Pulmonary Rehab for orientation to Pulmonary Rehab. He walked from [employer] without difficulty. He does not carry portable oxygen. Per pt, he uses oxygen never. Color good, skin warm and dry. Patient is oriented to time and place. Patient's medical history, psychosocial health, and medications reviewed. Psychosocial assessment reveals pt lives with their spouse. Pt is currently retired from [employer]. [place] hobbies include golfing and doing activities with his wife Eduardo Fuentes. Pt reports his stress level is moderate. Areas of stress/anxiety include Health Family. His wife has multiple health problems which is a source of stress along with his new diagnosis of interstitial lung disease. Pt does not exhibit signs of depression. He was depressed 6 months ago, is seeing a psychiatrist and on an antidepressant and feels the treatment has helped to where the only symptom he has is being tired.  We are in hopes the exercise program will assist in giving him more energy. PHQ2/9 score 0/2. Pt shows good  coping skills with positive outlook .  Offered emotional support and reassurance. Will continue to monitor and evaluate progress toward psychosocial goal(s) of no barriers to participation in pulmonary rehab. Physical assessment reveals heart rate is tachycardic, breath sounds clear to auscultation, no wheezes, rales, or rhonchi. Grip strength equal, strong. Patient reports he does take medications as prescribed. Patient states he follows a Regular diet. The patient reports no specific efforts to gain or lose weight.. Patient's weight will be monitored closely. Demonstration and practice of PLB using pulse oximeter. Patient able to return demonstration satisfactorily. Safety and hand hygiene in the exercise area reviewed with patient. Patient voices understanding of the information reviewed. Department expectations discussed with  patient and achievable goals were set. The patient shows enthusiasm about attending the program and we look forward to working with this nice gentleman. The patient is scheduled for a 6 min walk test on Tuesday, April 06, 2018 @ 3 pm and to begin exercise on Tuesday, April 13, 2018 @ 1030 am class.  [id number]

## 2018-04-06 ENCOUNTER — Encounter (HOSPITAL_COMMUNITY)
Admission: RE | Admit: 2018-04-06 | Discharge: 2018-04-06 | Disposition: A | Discharge status: Home / Self Care | Payer: PPO | Source: Ambulatory Visit | Attending: Internal Medicine | Admitting: Internal Medicine

## 2018-04-06 ENCOUNTER — Other Ambulatory Visit (INDEPENDENT_AMBULATORY_CARE_PROVIDER_SITE_OTHER): Payer: PPO

## 2018-04-06 DIAGNOSIS — J849 Interstitial pulmonary disease, unspecified: Secondary | ICD-10-CM | POA: Diagnosis not present

## 2018-04-06 DIAGNOSIS — Z5181 Encounter for therapeutic drug level monitoring: Secondary | ICD-10-CM | POA: Diagnosis not present

## 2018-04-06 DIAGNOSIS — J84112 Idiopathic pulmonary fibrosis: Secondary | ICD-10-CM | POA: Diagnosis not present

## 2018-04-06 LAB — HEPATIC FUNCTION PANEL
ALT: 22 U/L (ref 0–53)
AST: 25 U/L (ref 0–37)
Albumin: 3.9 g/dL (ref 3.5–5.2)
Alkaline Phosphatase: 125 U/L — ABNORMAL HIGH (ref 39–117)
Bilirubin, Direct: 0.1 mg/dL (ref 0.0–0.3)
TOTAL PROTEIN: 6.9 g/dL (ref 6.0–8.3)
Total Bilirubin: 0.4 mg/dL (ref 0.2–1.2)

## 2018-04-06 NOTE — Progress Notes (Signed)
Pulmonary Individual Treatment Plan  Patient Details  Name: Eduardo Fuentes MRN: [id number] Date of Birth: [birth year] Referring Provider:     Pulmonary Rehab Walk Test from 04/06/2018 in [place]  Referring Provider  Dr. Chase Caller      Initial Encounter Date:    Pulmonary Rehab Walk Test from 04/06/2018 in [place]  Date  04/06/18      Visit Diagnosis: Interstitial lung disease ([place])  Patient's Home Medications on Admission:   Current Outpatient Medications:  .  clonazePAM (KLONOPIN) 1 MG tablet, Take 1 tablet at 8 am, 1/2 tablet at 2 pm and 1 tablet at bedtime for anxiety. (Patient taking differently: Take 1 mg by mouth 3 (three) times daily. Pt states "I usually take 1 tablet three times a day"), Disp: 75 tablet, Rfl: 0 .  gabapentin (NEURONTIN) 300 MG capsule, Take 300 mg by mouth once., Disp: , Rfl:  .  hydrochlorothiazide (MICROZIDE) 12.5 MG capsule, Take 12.5 mg by mouth daily., Disp: , Rfl:  .  mirtazapine (REMERON) 30 MG tablet, TK 1 T PO QHS, Disp: , Rfl: 4 .  NEOMYCIN-POLYMYXIN-HYDROCORTISONE (CORTISPORIN) 1 % SOLN OTIC solution, Apply to bed after soaking (Patient not taking: Reported on 04/05/2018), Disp: 10 mL, Rfl: 0 .  nortriptyline (PAMELOR) 50 MG capsule, Take 25 mg by mouth daily at 8 pm. , Disp: , Rfl:  .  OFEV 150 MG CAPS, Take 150 mg by mouth 2 (two) times daily. , Disp: , Rfl:  .  pantoprazole (PROTONIX) 40 MG tablet, TAKE 1 TABLET BY MOUTH DAILY, Disp: 30 tablet, Rfl: 6  Past Medical History: Past Medical History:  Diagnosis Date  . Arthritis    NECK  . Cough    NON-PRODUCTIVE  . Depressive disorder    follow by dr [facility] psychiat[facility]  . Diverticulosis   . Diverticulosis of colon   . Generalized anxiety disorder   . GERD (gastroesophageal reflux disease)    WATCHES DIET  . Hemorrhoid   . High blood pressure   . History of colon polyps   . History of kidney stones   . Microscopic  hematuria   . Nocturia   . Panic attacks   . Prostate cancer ([place])   . Sinus infection   . Tinnitus    LEFT EAR -- CHRONIC    Tobacco Use: Social History   Tobacco Use  Smoking Status Never Smoker  Smokeless Tobacco Never Used    Labs: Recent Review Scientist, physiological    Labs for ITP Cardiac and Pulmonary Rehab Latest Ref Rng & Units 10/29/2011   Cholestrol 0 - 200 mg/dL 162   LDLCALC 0 - 99 mg/dL 101(H)   HDL >39 mg/dL 42   Trlycerides <150 mg/dL 94      Capillary Blood Glucose: No results found for: GLUCAP   Pulmonary Assessment Scores: Pulmonary Assessment Scores    Row Name 04/05/18 0955 04/06/18 1534       ADL UCSD   ADL Phase  Entry  -    SOB Score total  36  -      CAT Score   CAT Score  pre 11  -      mMRC Score   mMRC Score  -  1       Pulmonary Function Assessment: Pulmonary Function Assessment - 04/05/18 0950      Breath   Bilateral Breath Sounds  Clear    Shortness of Breath  Yes;Limiting  activity;Fear of Shortness of Breath       Exercise Target Goals: Exercise Program Goal: Individual exercise prescription set using results from initial 6 min walk test and THRR while considering  patient's activity barriers and safety.   Exercise Prescription Goal: Initial exercise prescription builds to 30-45 minutes a day of aerobic activity, 2-3 days per week.  Home exercise guidelines will be given to patient during program as part of exercise prescription that the participant will acknowledge.  Activity Barriers & Risk Stratification: Activity Barriers & Cardiac Risk Stratification - 04/05/18 0944      Activity Barriers & Cardiac Risk Stratification   Activity Barriers  None       6 Minute Walk: 6 Minute Walk    Row Name 04/06/18 1524         6 Minute Walk   Phase  Initial     Distance  1229 feet     Walk Time  6 minutes     # of Rest Breaks  0     MPH  2.34     METS  2.76     RPE  11     Perceived Dyspnea   1     VO2 Peak  10.4      Symptoms  No     Resting HR  94 bpm     Resting BP  112/80     Resting Oxygen Saturation   97 %     Exercise Oxygen Saturation  during 6 min walk  91 %     Max Ex. HR  112 bpm     Max Ex. BP  132/82     2 Minute Post BP  120/80       Interval HR   1 Minute HR  109     2 Minute HR  110     3 Minute HR  112     4 Minute HR  110     5 Minute HR  109     6 Minute HR  111     2 Minute Post HR  100     Interval Heart Rate?  Yes       Interval Oxygen   Interval Oxygen?  Yes     Baseline Oxygen Saturation %  97 %     1 Minute Oxygen Saturation %  92 %     1 Minute Liters of Oxygen  0 L     2 Minute Oxygen Saturation %  92 %     2 Minute Liters of Oxygen  0 L     3 Minute Oxygen Saturation %  91 %     3 Minute Liters of Oxygen  0 L     4 Minute Oxygen Saturation %  91 %     4 Minute Liters of Oxygen  0 L     5 Minute Oxygen Saturation %  91 %     5 Minute Liters of Oxygen  0 L     6 Minute Oxygen Saturation %  92 %     6 Minute Liters of Oxygen  0 L     2 Minute Post Oxygen Saturation %  94 %     2 Minute Post Liters of Oxygen  0 L        Oxygen Initial Assessment: Oxygen Initial Assessment - 04/05/18 0949      Home Oxygen   Home Oxygen Device  None  Sleep Oxygen Prescription  None    Home Exercise Oxygen Prescription  None    Home at Rest Exercise Oxygen Prescription  None       Oxygen Re-Evaluation:   Oxygen Discharge (Final Oxygen Re-Evaluation):   Initial Exercise Prescription: Initial Exercise Prescription - 04/06/18 1500      Date of Initial Exercise RX and Referring Provider   Date  04/06/18    Referring Provider  Dr. Chase Caller      Treadmill   MPH  1.8    Grade  2    Minutes  17      Bike   Level  0.5    Minutes  17      NuStep   Level  2    SPM  80    Minutes  17      Prescription Details   Frequency (times per week)  2    Duration  Progress to 45 minutes of aerobic exercise without signs/symptoms of physical distress      Intensity    THRR 40-80% of Max Heartrate  61-122    Ratings of Perceived Exertion  11-13    Perceived Dyspnea  0-4      Progression   Progression  Continue to progress workloads to maintain intensity without signs/symptoms of physical distress.      Resistance Training   Training Prescription  Yes    Weight  orange bands    Reps  10-15       Perform Capillary Blood Glucose checks as needed.  Exercise Prescription Changes:   Exercise Comments:   Exercise Goals and Review:   Exercise Goals Re-Evaluation :   Discharge Exercise Prescription (Final Exercise Prescription Changes):   Nutrition:  Target Goals: Understanding of nutrition guidelines, daily intake of sodium 1500mg , cholesterol 200mg , calories 30% from fat and 7% or less from saturated fats, daily to have 5 or more servings of fruits and vegetables.  Biometrics: Pre Biometrics - 04/05/18 0944      Pre Biometrics   Grip Strength  40 kg        Nutrition Therapy Plan and Nutrition Goals:   Nutrition Assessments:   Nutrition Goals Re-Evaluation:   Nutrition Goals Discharge (Final Nutrition Goals Re-Evaluation):   Psychosocial: Target Goals: Acknowledge presence or absence of significant depression and/or stress, maximize coping skills, provide positive support system. Participant is able to verbalize types and ability to use techniques and skills needed for reducing stress and depression.  Initial Review & Psychosocial Screening: Initial Psych Review & Screening - 04/05/18 1001      Initial Review   Current issues with  Current Psychotropic Meds;History of Depression;Current Depression   on antidepressant, sees a psychiatrist     [place]?  Yes      Barriers   Psychosocial barriers to participate in program  The patient should benefit from training in stress management and relaxation.      Screening Interventions   Interventions  Encouraged to exercise    Expected  Outcomes  Short Term goal: Utilizing psychosocial counselor, staff and physician to assist with identification of specific Stressors or current issues interfering with healing process. Setting desired goal for each stressor or current issue identified.       Quality of Life Scores:  Scores of 19 and below usually indicate a poorer quality of life in these areas.  A difference of  2-3 points is a clinically meaningful difference.  A difference of  2-3 points in the total score of the Quality of Life Index has been associated with significant improvement in overall quality of life, self-image, physical symptoms, and general health in studies assessing change in quality of life.   PHQ-9: Recent Review Flowsheet Data    Depression screen [facility] 2/9 04/05/2018   Decreased Interest 0   Down, Depressed, Hopeless 0   PHQ - 2 Score 0   Altered sleeping 0   Tired, decreased energy 2   Change in appetite 0   Feeling bad or failure about yourself  0   Trouble concentrating 0   Moving slowly or fidgety/restless 0   Suicidal thoughts 0   Difficult doing work/chores Not difficult at all     Interpretation of Total Score  Total Score Depression Severity:  1-4 = Minimal depression, 5-9 = Mild depression, 10-14 = Moderate depression, 15-19 = Moderately severe depression, 20-27 = Severe depression   Psychosocial Evaluation and Intervention: Psychosocial Evaluation - 04/05/18 1005      Psychosocial Evaluation & Interventions   Interventions  Stress management education;Relaxation education;Encouraged to exercise with the program and follow exercise prescription    Continue Psychosocial Services   Follow up required by staff   sees a psychiatrist, on an antidepressant      Psychosocial Re-Evaluation:   Psychosocial Discharge (Final Psychosocial Re-Evaluation):    Education: Education Goals: Education classes will be provided on a weekly basis, covering required topics. Participant will state  understanding/return demonstration of topics presented.  Learning Barriers/Preferences: Learning Barriers/Preferences - 04/05/18 1000      Learning Barriers/Preferences   Learning Barriers  None    Learning Preferences  Audio;Group Instruction;Individual Instruction;Computer/Internet;Pictoral;Skilled Demonstration;Verbal Instruction;Video;Written Material       Education Topics: How Lungs Work and Diseases: - Discuss the anatomy of the lungs and diseases that can affect the lungs, such as COPD.   Exercise: -Discuss the importance of exercise, FITT principles of exercise, normal and abnormal responses to exercise, and how to exercise safely.   Environmental Irritants: -Discuss types of environmental irritants and how to limit exposure to environmental irritants.   Meds/Inhalers and oxygen: - Discuss respiratory medications, definition of an inhaler and oxygen, and the proper way to use an inhaler and oxygen.   Energy Saving Techniques: - Discuss methods to conserve energy and decrease shortness of breath when performing activities of daily living.    Bronchial Hygiene / Breathing Techniques: - Discuss breathing mechanics, pursed-lip breathing technique,  proper posture, effective ways to clear airways, and other functional breathing techniques   Cleaning Equipment: - Provides group verbal and written instruction about the health risks of elevated stress, cause of high stress, and healthy ways to reduce stress.   Nutrition I: Fats: - Discuss the types of cholesterol, what cholesterol does to the body, and how cholesterol levels can be controlled.   Nutrition II: Labels: -Discuss the different components of food labels and how to read food labels.   Respiratory Infections: - Discuss the signs and symptoms of respiratory infections, ways to prevent respiratory infections, and the importance of seeking medical treatment when having a respiratory infection.   Stress I:  Signs and Symptoms: - Discuss the causes of stress, how stress may lead to anxiety and depression, and ways to limit stress.   Stress II: Relaxation: -Discuss relaxation techniques to limit stress.   Oxygen for Home/Travel: - Discuss how to prepare for travel when on oxygen and proper ways to transport and store oxygen to ensure safety.  Knowledge Questionnaire Score: Knowledge Questionnaire Score - 04/05/18 1007      Knowledge Questionnaire Score   Pre Score  14/18       Core Components/Risk Factors/Patient Goals at Admission: Personal Goals and Risk Factors at Admission - 04/05/18 1007      Core Components/Risk Factors/Patient Goals on Admission   Improve shortness of breath with ADL's  Yes    Intervention  Provide education, individualized exercise plan and daily activity instruction to help decrease symptoms of SOB with activities of daily living.    Expected Outcomes  Short Term: Improve cardiorespiratory fitness to achieve a reduction of symptoms when performing ADLs;Long Term: Be able to perform more ADLs without symptoms or delay the onset of symptoms       Core Components/Risk Factors/Patient Goals Review:  Goals and Risk Factor Review    Row Name 04/05/18 1008             Core Components/Risk Factors/Patient Goals Review   Personal Goals Review  Develop more efficient breathing techniques such as purse lipped breathing and diaphragmatic breathing and practicing self-pacing with activity.;Increase knowledge of respiratory medications and ability to use respiratory devices properly.;Improve shortness of breath with ADL's          Core Components/Risk Factors/Patient Goals at Discharge (Final Review):  Goals and Risk Factor Review - 04/05/18 1008      Core Components/Risk Factors/Patient Goals Review   Personal Goals Review  Develop more efficient breathing techniques such as purse lipped breathing and diaphragmatic breathing and practicing self-pacing with  activity.;Increase knowledge of respiratory medications and ability to use respiratory devices properly.;Improve shortness of breath with ADL's       ITP Comments:   Comments:

## 2018-04-08 ENCOUNTER — Telehealth: Payer: Self-pay | Admitting: Internal Medicine

## 2018-04-08 MED ORDER — ONDANSETRON HCL 4 MG PO TABS: 4.0000 mg | ORAL_TABLET | Freq: Three times a day (TID) | ORAL | 0 refills | Status: DC | PRN

## 2018-04-08 NOTE — Telephone Encounter (Signed)
Primary Pulmonologist: Dr. Chase Caller Last office visit and with whom: 03/16/2018 with Dr. Chase Caller What do we see them for (pulmonary problems): IPF Last OV assessment/plan:  Plan  -Definitely encourage her to attend pulmonary rehabilitation -In the future we can discuss a little bit more about patient support group in research trials -Currently disease is mild-moderate in severity -Continue acid reflux treatment -Continue nintedanib 150 mg twice daily monitoring for side effects and I am glad that you have no side effects at this point -Pulmonary fibrosis foundation related education material about IPF given to you  Follow-up 3 weeks LFT check 6 weeks with myself or nurse practitioner -okay to be seen in regular clinic ; main focus is to see if nintedanib tolerance is going well -Liver function test at the time of follow-up and simple walk test  Was appointment offered to patient (explain)?  No, appointment not necessary due to the nature of his call.   Reason for call:  Spoke with pt. States that Dickey Gave is making him nauseous. Pt is currently taking Ofev 150mg  BID. Denies vomiting or diarrhea. Has been taking Dramamine for the nausea and states that it has been helping. Pt would like recommendations on this.  Tonya - please advise. Thanks.

## 2018-04-08 NOTE — Telephone Encounter (Signed)
I ordered Zofran to help with the nausea. Thanks.

## 2018-04-08 NOTE — Telephone Encounter (Signed)
Called and spoke with patient, he is aware of response below. Patient verbalized understanding. Sipping on ice chips and water was also advised due to patient asking if that would be ok. No other questions or concerns. Will close encounter.

## 2018-04-09 NOTE — Progress Notes (Signed)
Eduardo Fuentes 68 y.o. male  DOB: 29-Sep-1950 MRN: [id number]           Nutrition Note 1. Interstitial lung disease ([place])    Past Medical History:  Diagnosis Date  . Arthritis    NECK  . Cough    NON-PRODUCTIVE  . Depressive disorder    follow by dr [facility] psychiatry  . Diverticulosis   . Diverticulosis of colon   . Generalized anxiety disorder   . GERD (gastroesophageal reflux disease)    WATCHES DIET  . Hemorrhoid   . High blood pressure   . History of colon polyps   . History of kidney stones   . Microscopic hematuria   . Nocturia   . Panic attacks   . Prostate cancer ([place])   . Sinus infection   . Tinnitus    LEFT EAR -- CHRONIC   Meds reviewed.    Current Outpatient Medications (Cardiovascular):  .  hydrochlorothiazide (MICROZIDE) 12.5 MG capsule, Take 12.5 mg by mouth daily.  Current Outpatient Medications (Respiratory):  Marland Kitchen  OFEV 150 MG CAPS, Take 150 mg by mouth 2 (two) times daily.     Current Outpatient Medications (Other):  .  clonazePAM (KLONOPIN) 1 MG tablet, Take 1 tablet at 8 am, 1/2 tablet at 2 pm and 1 tablet at bedtime for anxiety. (Patient taking differently: Take 1 mg by mouth 3 (three) times daily. Pt states "I usually take 1 tablet three times a day") .  gabapentin (NEURONTIN) 300 MG capsule, Take 300 mg by mouth once. .  mirtazapine (REMERON) 30 MG tablet, TK 1 T PO QHS .  NEOMYCIN-POLYMYXIN-HYDROCORTISONE (CORTISPORIN) 1 % SOLN OTIC solution, Apply to bed after soaking (Patient not taking: Reported on 04/05/2018) .  nortriptyline (PAMELOR) 50 MG capsule, Take 25 mg by mouth daily at 8 pm.  .  ondansetron (ZOFRAN) 4 MG tablet, Take 1 tablet (4 mg total) by mouth every 8 (eight) hours as needed for nausea or vomiting. .  pantoprazole (PROTONIX) 40 MG tablet, TAKE 1 TABLET BY MOUTH DAILY   Ht: Ht Readings from Last 1 Encounters:  04/05/18 5\' 9"  (1.753 m)     Wt:  Wt Readings from Last 6 Encounters:  04/05/18 193 lb 5.5 oz (87.7 kg)  03/16/18  190 lb (86.2 kg)  02/11/18 190 lb 3.2 oz (86.3 kg)  02/09/18 188 lb 3.2 oz (85.4 kg)  01/29/18 189 lb (85.7 kg)  01/07/18 182 lb (82.6 kg)     BMI: 28.55     Current tobacco use? No    Labs:  Lipid Panel     Component Value Date/Time   CHOL 162 10/29/2011 0655   TRIG 94 10/29/2011 0655   HDL 42 10/29/2011 0655   CHOLHDL 3.9 10/29/2011 0655   VLDL 19 10/29/2011 0655   LDLCALC 101 (H) 10/29/2011 0655    No results found for: HGBA1C  Nutrition Diagnosis ? Food-and nutrition-related knowledge deficit related to lack of exposure to information as related to diagnosis of pulmonary disease ? Overweight/obesity related to excessive energy intake as evidenced by a BMI of 28.55  Goal(s) 1. Pt to identify and limit food sources of sodium 2. Identify food quantities necessary to achieve wt loss of  -2# per week to a goal wt loss of 2.7-10.9 kg (6-24 lb) at graduation from pulmonary rehab 3. Pt to weigh and measure portion sizes for accuracy  4. Pt to build a healthy plate including fruits, vegetables, whole grains, and low-fat dairy products in  a healthy meal plan  Plan:  Pt to attend Pulmonary Nutrition class Will provide client-centered nutrition education as part of interdisciplinary care.    Monitor and Evaluate progress toward nutrition goal with team.   Laurina Bustle, MS, RD, LDN 04/09/2018 12:03 PM

## 2018-04-13 ENCOUNTER — Encounter (HOSPITAL_COMMUNITY)
Admission: RE | Admit: 2018-04-13 | Discharge: 2018-04-13 | Disposition: A | Discharge status: Home / Self Care | Payer: PPO | Source: Ambulatory Visit | Attending: Internal Medicine | Admitting: Internal Medicine

## 2018-04-13 ENCOUNTER — Ambulatory Visit (HOSPITAL_COMMUNITY): Payer: Self-pay

## 2018-04-13 DIAGNOSIS — J849 Interstitial pulmonary disease, unspecified: Secondary | ICD-10-CM

## 2018-04-13 NOTE — Progress Notes (Signed)
Daily Session Note  Patient Details  Name: Eduardo Fuentes MRN: [id number] Date of Birth: December 07, 1950 Referring Provider:     Pulmonary Rehab Walk Test from 04/06/2018 in [facility]  Referring Provider  Dr. Chase Caller      Encounter Date: 04/13/2018  Check In: Session Check In - 04/13/18 1027      Check-In   Supervising physician immediately available to respond to emergencies  [facility] immediately available    Physician(s)  Dr. Berle Mull    Location  [facility]    Staff Present  Rosebud Poles, RN, BSN;Carlette Wilber Oliphant, RN, BSN;Mimie Goering, RN;Molly DiVincenzo, MS, ACSM RCEP, Exercise Physiologist;Dalton Kris Mouton, MS, Exercise Physiologist    Medication changes reported      No    Fall or balance concerns reported     No    Tobacco Cessation  No Change    Warm-up and Cool-down  Performed as group-led instruction    Resistance Training Performed  Yes    VAD Patient?  No    PAD/SET Patient?  No      Pain Assessment   Currently in Pain?  No/denies    Multiple Pain Sites  No       Capillary Blood Glucose: No results found for this or any previous visit (from the past 24 hour(s)).  Exercise Prescription Changes - 04/13/18 1200      Response to Exercise   Blood Pressure (Admit)  122/84    Blood Pressure (Exercise)  124/78    Blood Pressure (Exit)  106/66    Heart Rate (Admit)  94 bpm    Heart Rate (Exercise)  119 bpm    Heart Rate (Exit)  94 bpm    Oxygen Saturation (Admit)  90 %    Oxygen Saturation (Exercise)  90 %    Oxygen Saturation (Exit)  94 %    Rating of Perceived Exertion (Exercise)  13    Perceived Dyspnea (Exercise)  1    Duration  Progress to 45 minutes of aerobic exercise without signs/symptoms of physical distress    Intensity  Other (comment)   40-80% of HRR     Progression   Progression  Continue to progress workloads to maintain intensity without signs/symptoms of physical distress.      Resistance Training   Training Prescription  Yes    Weight  orange bands    Reps  10-15    Time  10 Minutes      Treadmill   MPH  1.6    Grade  2    Minutes  17      Bike   Level  0.4    Minutes  17      NuStep   Level  2    SPM  80    Minutes  17    METs  2       Social History   Tobacco Use  Smoking Status Never Smoker  Smokeless Tobacco Never Used    Goals Met:  Exercise tolerated well No report of cardiac concerns or symptoms Strength training completed today  Goals Unmet:  Not Applicable  Comments: Service time is from 1030 to 1200    Dr. Rush Farmer is Medical Director for Pulmonary Rehab at [facility].

## 2018-04-14 ENCOUNTER — Encounter: Payer: Self-pay | Admitting: Podiatry

## 2018-04-14 ENCOUNTER — Ambulatory Visit (INDEPENDENT_AMBULATORY_CARE_PROVIDER_SITE_OTHER): Payer: PPO | Admitting: Podiatry

## 2018-04-14 DIAGNOSIS — L03032 Cellulitis of left toe: Secondary | ICD-10-CM

## 2018-04-14 MED ORDER — CEPHALEXIN 500 MG PO CAPS: 500.0000 mg | ORAL_CAPSULE | Freq: Three times a day (TID) | ORAL | 1 refills | Status: DC

## 2018-04-15 ENCOUNTER — Encounter (HOSPITAL_COMMUNITY)
Admission: RE | Admit: 2018-04-15 | Discharge: 2018-04-15 | Disposition: A | Discharge status: Home / Self Care | Payer: PPO | Source: Ambulatory Visit | Attending: Internal Medicine | Admitting: Internal Medicine

## 2018-04-15 ENCOUNTER — Ambulatory Visit (HOSPITAL_COMMUNITY): Payer: Self-pay

## 2018-04-15 DIAGNOSIS — J849 Interstitial pulmonary disease, unspecified: Secondary | ICD-10-CM | POA: Diagnosis not present

## 2018-04-15 NOTE — Progress Notes (Signed)
Daily Session Note  Patient Details  Name: Eduardo Fuentes MRN: [id number] Date of Birth: 1950-06-02 Referring Provider:     Pulmonary Rehab Walk Test from 04/06/2018 in [facility]  Referring Provider  Dr. Chase Caller      Encounter Date: 04/15/2018  Check In: Session Check In - 04/15/18 1028      Check-In   Supervising physician immediately available to respond to emergencies  [facility] immediately available    Physician(s)  Dr. Lonny Prude    Location  [facility]    Staff Present  Rosebud Poles, RN, BSN;Lisa Ysidro Evert, RN;Molly DiVincenzo, MS, ACSM RCEP, Exercise Physiologist;Leonna Schlee Kris Mouton, MS, Exercise Physiologist    Medication changes reported      No    Fall or balance concerns reported     No    Tobacco Cessation  No Change    Warm-up and Cool-down  Performed as group-led instruction    Resistance Training Performed  Yes    VAD Patient?  No    PAD/SET Patient?  No      Pain Assessment   Currently in Pain?  No/denies    Multiple Pain Sites  No       Capillary Blood Glucose: No results found for this or any previous visit (from the past 24 hour(s)).    Social History   Tobacco Use  Smoking Status Never Smoker  Smokeless Tobacco Never Used    Goals Met:  Exercise tolerated well  Goals Unmet:  Not Applicable  Comments: Service time is from 1030 to 1200    Dr. Rush Farmer is Medical Director for Pulmonary Rehab at [facility].

## 2018-04-15 NOTE — Progress Notes (Signed)
Eduardo Fuentes 68 y.o. male  DOB: [birth year] MRN: [id number]           Nutrition Note 1. Interstitial lung disease ([place])    Past Medical History:  Diagnosis Date  . Arthritis    NECK  . Cough    NON-PRODUCTIVE  . Depressive disorder    follow by dr [facility]  . Diverticulosis   . Diverticulosis of colon   . Generalized anxiety disorder   . GERD (gastroesophageal reflux disease)    WATCHES DIET  . Hemorrhoid   . High blood pressure   . History of colon polyps   . History of kidney stones   . Microscopic hematuria   . Nocturia   . Panic attacks   . Prostate cancer ([place])   . Sinus infection   . Tinnitus    LEFT EAR -- CHRONIC   Meds reviewed.    Current Outpatient Medications (Cardiovascular):  .  hydrochlorothiazide (MICROZIDE) 12.5 MG capsule, Take 12.5 mg by mouth daily.  Current Outpatient Medications (Respiratory):  Marland Kitchen  OFEV 150 MG CAPS, Take 150 mg by mouth 2 (two) times daily.     Current Outpatient Medications (Other):  .  cephALEXin (KEFLEX) 500 MG capsule, Take 1 capsule (500 mg total) by mouth 3 (three) times daily. .  clonazePAM (KLONOPIN) 1 MG tablet, Take 1 tablet at 8 am, 1/2 tablet at 2 pm and 1 tablet at bedtime for anxiety. (Patient taking differently: Take 1 mg by mouth 3 (three) times daily. Pt states "I usually take 1 tablet three times a day") .  gabapentin (NEURONTIN) 300 MG capsule, Take 300 mg by mouth once. .  mirtazapine (REMERON) 30 MG tablet, TK 1 T PO QHS .  NEOMYCIN-POLYMYXIN-HYDROCORTISONE (CORTISPORIN) 1 % SOLN OTIC solution, Apply to bed after soaking .  nortriptyline (PAMELOR) 50 MG capsule, Take 25 mg by mouth daily at 8 pm.  .  ondansetron (ZOFRAN) 4 MG tablet, Take 1 tablet (4 mg total) by mouth every 8 (eight) hours as needed for nausea or vomiting. .  pantoprazole (PROTONIX) 40 MG tablet, TAKE 1 TABLET BY MOUTH DAILY   Ht: Ht Readings from Last 1 Encounters:  04/05/18 5\' 9"  (1.753 m)     Wt:  Wt Readings from Last 6  Encounters:  04/05/18 193 lb 5.5 oz (87.7 kg)  03/16/18 190 lb (86.2 kg)  02/11/18 190 lb 3.2 oz (86.3 kg)  02/09/18 188 lb 3.2 oz (85.4 kg)  01/29/18 189 lb (85.7 kg)  01/07/18 182 lb (82.6 kg)     BMI: 28.55    Current tobacco use? No    Labs:  Lipid Panel     Component Value Date/Time   CHOL 162 10/29/2011 0655   TRIG 94 10/29/2011 0655   HDL 42 10/29/2011 0655   CHOLHDL 3.9 10/29/2011 0655   VLDL 19 10/29/2011 0655   LDLCALC 101 (H) 10/29/2011 0655    No results found for: HGBA1C  Nutrition Diagnosis ? Food-and nutrition-related knowledge deficit related to lack of exposure to information as related to diagnosis of pulmonary disease ? Overweight related to excessive energy intake as evidenced by a BMI of 28.55  Goal(s)  1. Pt to identify and limit food sources of sodium. 2. Pt to weight and measure portion sizes for accuracy  3. Identify food quantities necessary to achieve wt loss of  -2# per week to a goal wt loss of 2.7-10.9 kg (6-24 lb) at graduation from pulmonary rehab. 4. Pt to build  a healthy plate including fruits, vegetables, whole grains, and low-fat dairy products in a healthy meal plan.  Plan:  Pt to attend Pulmonary Nutrition class Will provide client-centered nutrition education as part of interdisciplinary care.    Monitor and Evaluate progress toward nutrition goal with team.   Laurina Bustle, MS, RD, LDN 04/15/2018 11:48 AM

## 2018-04-19 NOTE — Progress Notes (Signed)
Subjective:   Patient ID: Eduardo Fuentes, male   DOB: 68 y.o.   MRN: [id number]   HPI Patient presents stating concerned because her still some irritation of the left big toe and wants to make sure it is healing okay   ROS      Objective:  Physical Exam  Neurovascular status intact with patient found to have mild redness of the left hallux medial side that is localized with no proximal edema erythema or drainage noted currently and no systemic signs of infection     Assessment:  May be very localized paronychia infection left hallux but it appears to be stable and does not appear to have any kind of systemic involvement     Plan:  I advised him on the continuation of soaks and utilization of Neosporin ointment and allowing it air dry at home.  I explained if any increase in redness or any pathology were to occur he is to let us know immediately

## 2018-04-20 ENCOUNTER — Ambulatory Visit (HOSPITAL_COMMUNITY): Payer: Self-pay

## 2018-04-20 ENCOUNTER — Encounter (HOSPITAL_COMMUNITY)
Admission: RE | Admit: 2018-04-20 | Discharge: 2018-04-20 | Disposition: A | Discharge status: Home / Self Care | Payer: PPO | Source: Ambulatory Visit | Attending: Internal Medicine | Admitting: Internal Medicine

## 2018-04-20 DIAGNOSIS — J849 Interstitial pulmonary disease, unspecified: Secondary | ICD-10-CM | POA: Diagnosis not present

## 2018-04-20 NOTE — Progress Notes (Signed)
Daily Session Note  Patient Details  Name: Eduardo Fuentes MRN: [id number] Date of Birth: 12/15/1950 Referring Provider:     Pulmonary Rehab Walk Test from 04/06/2018 in [facility]  Referring Provider  Dr. Ramaswamy      Encounter Date: 04/20/2018  Check In: Session Check In - 04/20/18 1027      Check-In   Supervising physician immediately available to respond to emergencies  [facility] immediately available    Physician(s)  Dr. Nettey    Location  [facility]    Staff Present  Joan Behrens, RN, BSN; , MS, ACSM RCEP, Exercise Physiologist;Dalton Fletcher, MS, Exercise Physiologist    Medication changes reported      No    Fall or balance concerns reported     No    Tobacco Cessation  No Change    Warm-up and Cool-down  Performed as group-led instruction    Resistance Training Performed  Yes    PAD/SET Patient?  No      Pain Assessment   Currently in Pain?  No/denies    Multiple Pain Sites  No       Capillary Blood Glucose: No results found for this or any previous visit (from the past 24 hour(s)).    Social History   Tobacco Use  Smoking Status Never Smoker  Smokeless Tobacco Never Used    Goals Met:  Exercise tolerated well  Goals Unmet:  Not Applicable  Comments: Service time is from 10:30a to 12:00p    Dr. Wesam G. Yacoub is Medical Director for Pulmonary Rehab at [facility]. 

## 2018-04-22 ENCOUNTER — Telehealth (HOSPITAL_COMMUNITY): Payer: Self-pay | Admitting: Family Medicine

## 2018-04-22 ENCOUNTER — Encounter (HOSPITAL_COMMUNITY): Payer: PPO

## 2018-04-22 ENCOUNTER — Ambulatory Visit (HOSPITAL_COMMUNITY): Payer: Self-pay

## 2018-04-25 DIAGNOSIS — R931 Abnormal findings on diagnostic imaging of heart and coronary circulation: Secondary | ICD-10-CM

## 2018-04-25 HISTORY — DX: Abnormal findings on diagnostic imaging of heart and coronary circulation: R93.1

## 2018-04-27 ENCOUNTER — Encounter (HOSPITAL_COMMUNITY): Payer: Self-pay

## 2018-04-27 ENCOUNTER — Ambulatory Visit (HOSPITAL_COMMUNITY): Payer: Self-pay

## 2018-04-27 ENCOUNTER — Encounter (HOSPITAL_COMMUNITY)
Admission: RE | Admit: 2018-04-27 | Discharge: 2018-04-27 | Disposition: A | Discharge status: Home / Self Care | Payer: PPO | Source: Ambulatory Visit | Attending: Internal Medicine | Admitting: Internal Medicine

## 2018-04-27 VITALS — Wt 193.6 lb

## 2018-04-27 DIAGNOSIS — J849 Interstitial pulmonary disease, unspecified: Secondary | ICD-10-CM | POA: Diagnosis not present

## 2018-04-27 NOTE — Progress Notes (Signed)
Daily Session Note  Patient Details  Name: Eduardo Fuentes MRN: [id number] Date of Birth: Jun 28, 1950 Referring Provider:     Pulmonary Rehab Walk Test from 04/06/2018 in [facility]  Referring Provider  Dr. Chase Caller      Encounter Date: 04/27/2018  Check In: Session Check In - 04/27/18 1039      Check-In   Supervising physician immediately available to respond to emergencies  [facility] immediately available    Physician(s)  Dr. Maylene Roes    Location  [facility]    Staff Present  Joycelyn Man RN, BSN;Joan Leonia Reeves, RN, BSN;Lisa Ysidro Evert, RN;Karynn Deblasi, MS, ACSM RCEP, Exercise Physiologist;Dalton Kris Mouton, MS, Exercise Physiologist    Medication changes reported      No    Fall or balance concerns reported     No    Tobacco Cessation  No Change    Warm-up and Cool-down  Performed as group-led instruction    Resistance Training Performed  Yes    VAD Patient?  No    PAD/SET Patient?  No      Pain Assessment   Currently in Pain?  No/denies       Capillary Blood Glucose: No results found for this or any previous visit (from the past 24 hour(s)).  Exercise Prescription Changes - 04/27/18 1200      Response to Exercise   Blood Pressure (Admit)  102/58    Blood Pressure (Exercise)  100/74    Blood Pressure (Exit)  100/66    Heart Rate (Admit)  99 bpm    Heart Rate (Exercise)  126 bpm    Heart Rate (Exit)  87 bpm    Oxygen Saturation (Admit)  96 %    Oxygen Saturation (Exercise)  90 %    Oxygen Saturation (Exit)  95 %    Rating of Perceived Exertion (Exercise)  13    Perceived Dyspnea (Exercise)  1    Duration  Progress to 45 minutes of aerobic exercise without signs/symptoms of physical distress    Intensity  Other (comment)   40-80% of HRR     Progression   Progression  Continue to progress workloads to maintain intensity without signs/symptoms of physical distress.      Resistance Training   Training Prescription   Yes    Weight  orange bands    Reps  10-15    Time  10 Minutes      Treadmill   MPH  1.6    Grade  2    Minutes  17      Bike   Level  0.5    Minutes  17      NuStep   Level  3    SPM  80    Minutes  17    METs  2.1       Social History   Tobacco Use  Smoking Status Never Smoker  Smokeless Tobacco Never Used    Goals Met:  Exercise tolerated well  Goals Unmet:  Not Applicable  Comments: Service time is from 10:30a to 12:00p    Dr. Rush Farmer is Medical Director for Pulmonary Rehab at [facility].

## 2018-04-27 NOTE — Progress Notes (Signed)
Pulmonary Individual Treatment Plan  Patient Details  Name: Eduardo Fuentes MRN: [id number] Date of Birth: [birth year] Referring Provider:     Pulmonary Rehab Walk Test from 04/06/2018 in [place]  Referring Provider  Dr. Chase Caller      Initial Encounter Date:    Pulmonary Rehab Walk Test from 04/06/2018 in [place]  Date  04/06/18      Visit Diagnosis: Interstitial lung disease ([place])  Patient's Home Medications on Admission:   Current Outpatient Medications:  .  cephALEXin (KEFLEX) 500 MG capsule, Take 1 capsule (500 mg total) by mouth 3 (three) times daily., Disp: 21 capsule, Rfl: 1 .  clonazePAM (KLONOPIN) 1 MG tablet, Take 1 tablet at 8 am, 1/2 tablet at 2 pm and 1 tablet at bedtime for anxiety. (Patient taking differently: Take 1 mg by mouth 3 (three) times daily. Pt states "I usually take 1 tablet three times a day"), Disp: 75 tablet, Rfl: 0 .  gabapentin (NEURONTIN) 300 MG capsule, Take 300 mg by mouth once., Disp: , Rfl:  .  hydrochlorothiazide (MICROZIDE) 12.5 MG capsule, Take 12.5 mg by mouth daily., Disp: , Rfl:  .  mirtazapine (REMERON) 30 MG tablet, TK 1 T PO QHS, Disp: , Rfl: 4 .  NEOMYCIN-POLYMYXIN-HYDROCORTISONE (CORTISPORIN) 1 % SOLN OTIC solution, Apply to bed after soaking, Disp: 10 mL, Rfl: 0 .  nortriptyline (PAMELOR) 50 MG capsule, Take 25 mg by mouth daily at 8 pm. , Disp: , Rfl:  .  OFEV 150 MG CAPS, Take 150 mg by mouth 2 (two) times daily. , Disp: , Rfl:  .  ondansetron (ZOFRAN) 4 MG tablet, Take 1 tablet (4 mg total) by mouth every 8 (eight) hours as needed for nausea or vomiting., Disp: 20 tablet, Rfl: 0 .  pantoprazole (PROTONIX) 40 MG tablet, TAKE 1 TABLET BY MOUTH DAILY, Disp: 30 tablet, Rfl: 6  Past Medical History: Past Medical History:  Diagnosis Date  . Arthritis    NECK  . Cough    NON-PRODUCTIVE  . Depressive disorder    follow by dr [facility] psychiat[facility]  . Diverticulosis   .  Diverticulosis of colon   . Generalized anxiety disorder   . GERD (gastroesophageal reflux disease)    WATCHES DIET  . Hemorrhoid   . High blood pressure   . History of colon polyps   . History of kidney stones   . Microscopic hematuria   . Nocturia   . Panic attacks   . Prostate cancer ([place])   . Sinus infection   . Tinnitus    LEFT EAR -- CHRONIC    Tobacco Use: Social History   Tobacco Use  Smoking Status Never Smoker  Smokeless Tobacco Never Used    Labs: Recent Review Scientist, physiological    Labs for ITP Cardiac and Pulmonary Rehab Latest Ref Rng & Units 10/29/2011   Cholestrol 0 - 200 mg/dL 162   LDLCALC 0 - 99 mg/dL 101(H)   HDL >39 mg/dL 42   Trlycerides <150 mg/dL 94      Capillary Blood Glucose: No results found for: GLUCAP   Pulmonary Assessment Scores: Pulmonary Assessment Scores    Row Name 04/05/18 0955 04/06/18 1534       ADL UCSD   ADL Phase  Entry  -    SOB Score total  36  -      CAT Score   CAT Score  pre 11  -  mMRC Score   mMRC Score  -  1       Pulmonary Function Assessment: Pulmonary Function Assessment - 04/05/18 0950      Breath   Bilateral Breath Sounds  Clear    Shortness of Breath  Yes;Limiting activity;Fear of Shortness of Breath       Exercise Target Goals: Exercise Program Goal: Individual exercise prescription set using results from initial 6 min walk test and THRR while considering  patient's activity barriers and safety.   Exercise Prescription Goal: Initial exercise prescription builds to 30-45 minutes a day of aerobic activity, 2-3 days per week.  Home exercise guidelines will be given to patient during program as part of exercise prescription that the participant will acknowledge.  Activity Barriers & Risk Stratification: Activity Barriers & Cardiac Risk Stratification - 04/05/18 0944      Activity Barriers & Cardiac Risk Stratification   Activity Barriers  None       6 Minute Walk: 6 Minute Walk    Row  Name 04/06/18 1524         6 Minute Walk   Phase  Initial     Distance  1229 feet     Walk Time  6 minutes     # of Rest Breaks  0     MPH  2.34     METS  2.76     RPE  11     Perceived Dyspnea   1     VO2 Peak  10.4     Symptoms  No     Resting HR  94 bpm     Resting BP  112/80     Resting Oxygen Saturation   97 %     Exercise Oxygen Saturation  during 6 min walk  91 %     Max Ex. HR  112 bpm     Max Ex. BP  132/82     2 Minute Post BP  120/80       Interval HR   1 Minute HR  109     2 Minute HR  110     3 Minute HR  112     4 Minute HR  110     5 Minute HR  109     6 Minute HR  111     2 Minute Post HR  100     Interval Heart Rate?  Yes       Interval Oxygen   Interval Oxygen?  Yes     Baseline Oxygen Saturation %  97 %     1 Minute Oxygen Saturation %  92 %     1 Minute Liters of Oxygen  0 L     2 Minute Oxygen Saturation %  92 %     2 Minute Liters of Oxygen  0 L     3 Minute Oxygen Saturation %  91 %     3 Minute Liters of Oxygen  0 L     4 Minute Oxygen Saturation %  91 %     4 Minute Liters of Oxygen  0 L     5 Minute Oxygen Saturation %  91 %     5 Minute Liters of Oxygen  0 L     6 Minute Oxygen Saturation %  92 %     6 Minute Liters of Oxygen  0 L     2 Minute Post Oxygen Saturation %  94 %  2 Minute Post Liters of Oxygen  0 L        Oxygen Initial Assessment: Oxygen Initial Assessment - 04/05/18 0949      Home Oxygen   Home Oxygen Device  None    Sleep Oxygen Prescription  None    Home Exercise Oxygen Prescription  None    Home at Rest Exercise Oxygen Prescription  None       Oxygen Re-Evaluation: Oxygen Re-Evaluation    Row Name 04/27/18 0819             Program Oxygen Prescription   Program Oxygen Prescription  None         Home Oxygen   Home Oxygen Device  None       Sleep Oxygen Prescription  None       Home Exercise Oxygen Prescription  None       Home at Rest Exercise Oxygen Prescription  None         Goals/Expected  Outcomes   Short Term Goals  To learn and exhibit compliance with exercise, home and travel O2 prescription;To learn and understand importance of monitoring SPO2 with pulse oximeter and demonstrate accurate use of the pulse oximeter.;To learn and understand importance of maintaining oxygen saturations>88%;To learn and demonstrate proper pursed lip breathing techniques or other breathing techniques.;To learn and demonstrate proper use of respiratory medications       Long  Term Goals  Exhibits compliance with exercise, home and travel O2 prescription;Verbalizes importance of monitoring SPO2 with pulse oximeter and return demonstration;Maintenance of O2 saturations>88%;Compliance with respiratory medication;Demonstrates proper use of MDI's;Exhibits proper breathing techniques, such as pursed lip breathing or other method taught during program session       Goals/Expected Outcomes  compliance           Oxygen Discharge (Final Oxygen Re-Evaluation): Oxygen Re-Evaluation - 04/27/18 0819      Program Oxygen Prescription   Program Oxygen Prescription  None      Home Oxygen   Home Oxygen Device  None    Sleep Oxygen Prescription  None    Home Exercise Oxygen Prescription  None    Home at Rest Exercise Oxygen Prescription  None      Goals/Expected Outcomes   Short Term Goals  To learn and exhibit compliance with exercise, home and travel O2 prescription;To learn and understand importance of monitoring SPO2 with pulse oximeter and demonstrate accurate use of the pulse oximeter.;To learn and understand importance of maintaining oxygen saturations>88%;To learn and demonstrate proper pursed lip breathing techniques or other breathing techniques.;To learn and demonstrate proper use of respiratory medications    Long  Term Goals  Exhibits compliance with exercise, home and travel O2 prescription;Verbalizes importance of monitoring SPO2 with pulse oximeter and return demonstration;Maintenance of O2  saturations>88%;Compliance with respiratory medication;Demonstrates proper use of MDI's;Exhibits proper breathing techniques, such as pursed lip breathing or other method taught during program session    Goals/Expected Outcomes  compliance        Initial Exercise Prescription: Initial Exercise Prescription - 04/06/18 1500      Date of Initial Exercise RX and Referring Provider   Date  04/06/18    Referring Provider  Dr. Chase Caller      Treadmill   MPH  1.8    Grade  2    Minutes  17      Bike   Level  0.5    Minutes  17      NuStep   Level  2    SPM  80    Minutes  17      Prescription Details   Frequency (times per week)  2    Duration  Progress to 45 minutes of aerobic exercise without signs/symptoms of physical distress      Intensity   THRR 40-80% of Max Heartrate  61-122    Ratings of Perceived Exertion  11-13    Perceived Dyspnea  0-4      Progression   Progression  Continue to progress workloads to maintain intensity without signs/symptoms of physical distress.      Resistance Training   Training Prescription  Yes    Weight  orange bands    Reps  10-15       Perform Capillary Blood Glucose checks as needed.  Exercise Prescription Changes:  Exercise Prescription Changes    Row Name 04/13/18 1200 04/27/18 1200           Response to Exercise   Blood Pressure (Admit)  122/84  102/58      Blood Pressure (Exercise)  124/78  100/74      Blood Pressure (Exit)  106/66  100/66      Heart Rate (Admit)  94 bpm  99 bpm      Heart Rate (Exercise)  119 bpm  126 bpm      Heart Rate (Exit)  94 bpm  87 bpm      Oxygen Saturation (Admit)  90 %  96 %      Oxygen Saturation (Exercise)  90 %  90 %      Oxygen Saturation (Exit)  94 %  95 %      Rating of Perceived Exertion (Exercise)  13  13      Perceived Dyspnea (Exercise)  1  1      Duration  Progress to 45 minutes of aerobic exercise without signs/symptoms of physical distress  Progress to 45 minutes of aerobic  exercise without signs/symptoms of physical distress      Intensity  Other (comment) 40-80% of HRR  Other (comment) 40-80% of HRR        Progression   Progression  Continue to progress workloads to maintain intensity without signs/symptoms of physical distress.  Continue to progress workloads to maintain intensity without signs/symptoms of physical distress.        Resistance Training   Training Prescription  Yes  Yes      Weight  orange bands  orange bands      Reps  10-15  10-15      Time  10 Minutes  10 Minutes        Treadmill   MPH  1.6  1.6      Grade  2  2      Minutes  17  17        Bike   Level  0.4  0.5      Minutes  17  17        NuStep   Level  2  3      SPM  80  80      Minutes  17  17      METs  2  2.1         Exercise Comments:   Exercise Goals and Review:   Exercise Goals Re-Evaluation : Exercise Goals Re-Evaluation    Row Name 04/27/18 0820             Exercise Goal  Re-Evaluation   Exercise Goals Review  Increase Physical Activity;Increase Strength and Stamina;Able to understand and use rate of perceived exertion (RPE) scale;Able to understand and use Dyspnea scale;Knowledge and understanding of Target Heart Rate Range (THRR);Understanding of Exercise Prescription       Comments  Pt has completed 3 exercise sessions and is motivated to exercise. Pt is currently exercising at 2.87 METs while on the treadmill. I expect the pt to progress well through the program. Will continue to monitor and progress as able.        Expected Outcomes  Through exercise at rehab and at home, the patient will decrease shortness of breath with daily activities and feel confident in carrying out an exercise regime at home.           Discharge Exercise Prescription (Final Exercise Prescription Changes): Exercise Prescription Changes - 04/27/18 1200      Response to Exercise   Blood Pressure (Admit)  102/58    Blood Pressure (Exercise)  100/74    Blood Pressure (Exit)   100/66    Heart Rate (Admit)  99 bpm    Heart Rate (Exercise)  126 bpm    Heart Rate (Exit)  87 bpm    Oxygen Saturation (Admit)  96 %    Oxygen Saturation (Exercise)  90 %    Oxygen Saturation (Exit)  95 %    Rating of Perceived Exertion (Exercise)  13    Perceived Dyspnea (Exercise)  1    Duration  Progress to 45 minutes of aerobic exercise without signs/symptoms of physical distress    Intensity  Other (comment)   40-80% of HRR     Progression   Progression  Continue to progress workloads to maintain intensity without signs/symptoms of physical distress.      Resistance Training   Training Prescription  Yes    Weight  orange bands    Reps  10-15    Time  10 Minutes      Treadmill   MPH  1.6    Grade  2    Minutes  17      Bike   Level  0.5    Minutes  17      NuStep   Level  3    SPM  80    Minutes  17    METs  2.1       Nutrition:  Target Goals: Understanding of nutrition guidelines, daily intake of sodium '1500mg'$ , cholesterol '200mg'$ , calories 30% from fat and 7% or less from saturated fats, daily to have 5 or more servings of fruits and vegetables.  Biometrics: Pre Biometrics - 04/05/18 0944      Pre Biometrics   Grip Strength  40 kg        Nutrition Therapy Plan and Nutrition Goals: Nutrition Therapy & Goals - 04/09/18 1213      Nutrition Therapy   Diet  general healthful      Personal Nutrition Goals   Nutrition Goal  Pt to identify and limit food sources of sodium.    Personal Goal #2  Identify food quantities necessary to achieve wt loss of  -2# per week to a goal wt loss of 2.7-10.9 kg (6-24 lb) at graduation from pulmonary rehab    Personal Goal #3  Pt to weigh and measure portion sizes for accuracy     Personal Goal #4  Pt to build a healthy plate including fruits, vegetables, whole grains, and low-fat dairy products in a  healthy meal plan      Intervention Plan   Intervention  Prescribe, educate and counsel regarding individualized  specific dietary modifications aiming towards targeted core components such as weight, hypertension, lipid management, diabetes, heart failure and other comorbidities.    Expected Outcomes  Short Term Goal: Understand basic principles of dietary content, such as calories, fat, sodium, cholesterol and nutrients.;Long Term Goal: Adherence to prescribed nutrition plan.       Nutrition Assessments: Nutrition Assessments - 04/09/18 1217      Rate Your Plate Scores   Pre Score  49       Nutrition Goals Re-Evaluation: Nutrition Goals Re-Evaluation    Row Name 04/09/18 1213             Goals   Current Weight  193 lb 5.5 oz (87.7 kg)          Nutrition Goals Discharge (Final Nutrition Goals Re-Evaluation): Nutrition Goals Re-Evaluation - 04/09/18 1213      Goals   Current Weight  193 lb 5.5 oz (87.7 kg)       Psychosocial: Target Goals: Acknowledge presence or absence of significant depression and/or stress, maximize coping skills, provide positive support system. Participant is able to verbalize types and ability to use techniques and skills needed for reducing stress and depression.  Initial Review & Psychosocial Screening: Initial Psych Review & Screening - 04/05/18 1001      Initial Review   Current issues with  Current Psychotropic Meds;History of Depression;Current Depression   on antidepressant, sees a psychiatrist     [place]?  Yes      Barriers   Psychosocial barriers to participate in program  The patient should benefit from training in stress management and relaxation.      Screening Interventions   Interventions  Encouraged to exercise    Expected Outcomes  Short Term goal: Utilizing psychosocial counselor, staff and physician to assist with identification of specific Stressors or current issues interfering with healing process. Setting desired goal for each stressor or current issue identified.       Quality of Life  Scores:  Scores of 19 and below usually indicate a poorer quality of life in these areas.  A difference of  2-3 points is a clinically meaningful difference.  A difference of 2-3 points in the total score of the Quality of Life Index has been associated with significant improvement in overall quality of life, self-image, physical symptoms, and general health in studies assessing change in quality of life.  PHQ-9: Recent Review Flowsheet Data    Depression screen [facility] 2/9 04/05/2018   Decreased Interest 0   Down, Depressed, Hopeless 0   PHQ - 2 Score 0   Altered sleeping 0   Tired, decreased energy 2   Change in appetite 0   Feeling bad or failure about yourself  0   Trouble concentrating 0   Moving slowly or fidgety/restless 0   Suicidal thoughts 0   Difficult doing work/chores Not difficult at all     Interpretation of Total Score  Total Score Depression Severity:  1-4 = Minimal depression, 5-9 = Mild depression, 10-14 = Moderate depression, 15-19 = Moderately severe depression, 20-27 = Severe depression   Psychosocial Evaluation and Intervention: Psychosocial Evaluation - 04/27/18 0750      Psychosocial Evaluation & Interventions   Interventions  Stress management education;Relaxation education;Encouraged to exercise with the program and follow exercise prescription    Comments  Pt  is being seen by a psychiatrist for a history of depression, but states that this helps him. He had been prescribed medications, which has been successful and he fells alot better.     Expected Outcomes  Pt will remain under psychiatrist care with the coordination of PCP to ensure effectiveness of treatment. Pt will also report continued sucess and will report changes to staff or physician care    Continue Psychosocial Services   Follow up required by staff       Psychosocial Re-Evaluation: Psychosocial Re-Evaluation    [place] Name 04/27/18 0756 04/27/18 0801           Psychosocial Re-Evaluation    Current issues with  Current Depression;History of Depression;Current Psychotropic Meds  Current Depression;History of Depression;Current Psychotropic Meds      Comments  -  Pt is being seen by a psychiatrist for a history of depression, but states that this helps him. He had been prescribed medications, which has been successful and he fells alot better.       Expected Outcomes  -  Pt will remain under care of PCP and psychiatrist and will report changes to staff. Pt will remain free of barriers to participation under current treatment.      Interventions  Stress management education;Relaxation education;Encouraged to attend Pulmonary Rehabilitation for the exercise  Stress management education;Relaxation education;Encouraged to attend Pulmonary Rehabilitation for the exercise      Continue Psychosocial Services   Follow up required by staff  Follow up required by staff         Psychosocial Discharge (Final Psychosocial Re-Evaluation): Psychosocial Re-Evaluation - 04/27/18 0801      Psychosocial Re-Evaluation   Current issues with  Current Depression;History of Depression;Current Psychotropic Meds    Comments  Pt is being seen by a psychiatrist for a history of depression, but states that this helps him. He had been prescribed medications, which has been successful and he fells alot better.     Expected Outcomes  Pt will remain under care of PCP and psychiatrist and will report changes to staff. Pt will remain free of barriers to participation under current treatment.    Interventions  Stress management education;Relaxation education;Encouraged to attend Pulmonary Rehabilitation for the exercise    Continue Psychosocial Services   Follow up required by staff       Education: Education Goals: Education classes will be provided on a weekly basis, covering required topics. Participant will state understanding/return demonstration of topics presented.  Learning Barriers/Preferences: Learning  Barriers/Preferences - 04/05/18 1000      Learning Barriers/Preferences   Learning Barriers  None    Learning Preferences  Audio;Group Instruction;Individual Instruction;Computer/Internet;Pictoral;Skilled Demonstration;Verbal Instruction;Video;Written Material       Education Topics: Risk Factor Reduction:  -Group instruction that is supported by a PowerPoint presentation. Instructor discusses the definition of a risk factor, different risk factors for pulmonary disease, and how the heart and lungs work together.     Nutrition for Pulmonary Patient:  -Group instruction provided by PowerPoint slides, verbal discussion, and written materials to support subject matter. The instructor gives an explanation and review of healthy diet recommendations, which includes a discussion on weight management, recommendations for fruit and vegetable consumption, as well as protein, fluid, caffeine, fiber, sodium, sugar, and alcohol. Tips for eating when patients are short of breath are discussed.   Pursed Lip Breathing:  -Group instruction that is supported by demonstration and informational handouts. Instructor discusses the benefits of pursed lip and diaphragmatic breathing  and detailed demonstration on how to preform both.     Oxygen Safety:  -Group instruction provided by PowerPoint, verbal discussion, and written material to support subject matter. There is an overview of "What is Oxygen" and "Why do we need it".  Instructor also reviews how to create a safe environment for oxygen use, the importance of using oxygen as prescribed, and the risks of noncompliance. There is a brief discussion on traveling with oxygen and resources the patient may utilize.   Oxygen Equipment:  -Group instruction provided by [facility] Staff utilizing handouts, written materials, and equipment demonstrations.   Signs and Symptoms:  -Group instruction provided by written material and verbal discussion to support subject  matter. Warning signs and symptoms of infection, stroke, and heart attack are reviewed and when to call the physician/911 reinforced. Tips for preventing the spread of infection discussed.   Advanced Directives:  -Group instruction provided by verbal instruction and written material to support subject matter. Instructor reviews Advanced Directive laws and proper instruction for filling out document.   Pulmonary Video:  -Group video education that reviews the importance of medication and oxygen compliance, exercise, good nutrition, pulmonary hygiene, and pursed lip and diaphragmatic breathing for the pulmonary patient.   Exercise for the Pulmonary Patient:  -Group instruction that is supported by a PowerPoint presentation. Instructor discusses benefits of exercise, core components of exercise, frequency, duration, and intensity of an exercise routine, importance of utilizing pulse oximetry during exercise, safety while exercising, and options of places to exercise outside of rehab.     PULMONARY REHAB OTHER RESPIRATORY from 04/15/2018 in [place]  Date  04/15/18  Educator  ep  Instruction Review Code  1- Verbalizes Understanding      Pulmonary Medications:  -Verbally interactive group education provided by instructor with focus on inhaled medications and proper administration.   Anatomy and Physiology of the Respiratory System and Intimacy:  -Group instruction provided by PowerPoint, verbal discussion, and written material to support subject matter. Instructor reviews respiratory cycle and anatomical components of the respiratory system and their functions. Instructor also reviews differences in obstructive and restrictive respiratory diseases with examples of each. Intimacy, Sex, and Sexuality differences are reviewed with a discussion on how relationships can change when diagnosed with pulmonary disease. Common sexual concerns are reviewed.   MD DAY -A  group question and answer session with a medical doctor that allows participants to ask questions that relate to their pulmonary disease state.   OTHER EDUCATION -Group or individual verbal, written, or video instructions that support the educational goals of the pulmonary rehab program.   Holiday Eating Survival Tips:  -Group instruction provided by PowerPoint slides, verbal discussion, and written materials to support subject matter. The instructor gives patients tips, tricks, and techniques to help them not only survive but enjoy the holidays despite the onslaught of food that accompanies the holidays.   Knowledge Questionnaire Score: Knowledge Questionnaire Score - 04/05/18 1007      Knowledge Questionnaire Score   Pre Score  14/18       Core Components/Risk Factors/Patient Goals at Admission: Personal Goals and Risk Factors at Admission - 04/05/18 1007      Core Components/Risk Factors/Patient Goals on Admission   Improve shortness of breath with ADL's  Yes    Intervention  Provide education, individualized exercise plan and daily activity instruction to help decrease symptoms of SOB with activities of daily living.    Expected Outcomes  Short Term: Improve  cardiorespiratory fitness to achieve a reduction of symptoms when performing ADLs;Long Term: Be able to perform more ADLs without symptoms or delay the onset of symptoms       Core Components/Risk Factors/Patient Goals Review:  Goals and Risk Factor Review    Row Name 04/05/18 1008 04/27/18 0805           Core Components/Risk Factors/Patient Goals Review   Personal Goals Review  Develop more efficient breathing techniques such as purse lipped breathing and diaphragmatic breathing and practicing self-pacing with activity.;Increase knowledge of respiratory medications and ability to use respiratory devices properly.;Improve shortness of breath with ADL's  Develop more efficient breathing techniques such as purse lipped  breathing and diaphragmatic breathing and practicing self-pacing with activity.;Increase knowledge of respiratory medications and ability to use respiratory devices properly.;Improve shortness of breath with ADL's      Review  -  Eduardo Fuentes is off to a great start in rehab. He has completed 3 exercise sessions and 1 education session. He has already shown improvement in his workloads of an increase to 0.5 on the airdyne, and increase to level 3 on the nustep and maintaining at 1.6/2.0 on the treadmill. Pt also consistently reports mild SOB. Pt is able to demonstrate PLB technique with verbal cues and will hope to become more independent with this as he progresses in rehab. Will continue to monitor for progress at next 30 day review.      Expected Outcomes  -  see admission goals and outcomes         Core Components/Risk Factors/Patient Goals at Discharge (Final Review):  Goals and Risk Factor Review - 04/27/18 0805      Core Components/Risk Factors/Patient Goals Review   Personal Goals Review  Develop more efficient breathing techniques such as purse lipped breathing and diaphragmatic breathing and practicing self-pacing with activity.;Increase knowledge of respiratory medications and ability to use respiratory devices properly.;Improve shortness of breath with ADL's    Review  Eduardo Fuentes is off to a great start in rehab. He has completed 3 exercise sessions and 1 education session. He has already shown improvement in his workloads of an increase to 0.5 on the airdyne, and increase to level 3 on the nustep and maintaining at 1.6/2.0 on the treadmill. Pt also consistently reports mild SOB. Pt is able to demonstrate PLB technique with verbal cues and will hope to become more independent with this as he progresses in rehab. Will continue to monitor for progress at next 30 day review.    Expected Outcomes  see admission goals and outcomes       ITP Comments: ITP Comments    Row Name 04/27/18 0748            ITP Comments  Dr. Jennet Maduro, Medial Director, Pulmonary rehab          Comments: ITP REVIEW Pt is making expected progress toward personal goals after completing 3 sessions. Recommend continued exercise, life style modification, education, and utilization of breathing techniques to increase stamina and strength and decrease shortness of breath with exertion.  Joycelyn Man RN, BSN Cardiac and Pulmonary Rehab RN

## 2018-04-29 ENCOUNTER — Encounter (HOSPITAL_COMMUNITY): Payer: PPO

## 2018-04-29 ENCOUNTER — Encounter: Payer: Self-pay | Admitting: Internal Medicine

## 2018-04-29 ENCOUNTER — Ambulatory Visit (HOSPITAL_COMMUNITY): Payer: Self-pay

## 2018-04-29 ENCOUNTER — Ambulatory Visit: Payer: PPO | Admitting: Internal Medicine

## 2018-04-29 VITALS — BP 126/74 | HR 93 | Ht 69.0 in | Wt 193.8 lb

## 2018-04-29 DIAGNOSIS — Z5181 Encounter for therapeutic drug level monitoring: Secondary | ICD-10-CM | POA: Diagnosis not present

## 2018-04-29 DIAGNOSIS — K219 Gastro-esophageal reflux disease without esophagitis: Secondary | ICD-10-CM | POA: Diagnosis not present

## 2018-04-29 DIAGNOSIS — J84112 Idiopathic pulmonary fibrosis: Secondary | ICD-10-CM

## 2018-04-29 NOTE — Addendum Note (Signed)
Addended by: Suzzanne Cloud E on: 04/29/2018 04:55 PM   Modules accepted: Orders

## 2018-04-29 NOTE — Patient Instructions (Addendum)
ICD-10-CM   1. IPF (idiopathic pulmonary fibrosis) ([place]) J84.112   2. Encounter for therapeutic drug monitoring Z51.81      Diagnosis is IPF - unknown reason but likely hiatal hernia played ar ole  - diagnosis given 02/11/18  Glad you are on ofev since 02/27/2018 and so far tolerating it well and LFT normnal feb 2020  Glad you are doing well in pulmonary rehabilitation  Plan  -continue rehab  - continue ofev   - space atleast 10h  - careful with foods you eat and give you diarrhea  - can do immodium  As needed  - check LFT 04/29/2018 - cotninue protonix  -In the future we can discuss a little bit more about patient support group and  research trials   Follow-up 6 weeks do LFT check 6 weeks do spirometry and dlco 6 weeks return to ILD clinic

## 2018-04-29 NOTE — Addendum Note (Signed)
Addended by: Lorretta Harp on: 04/29/2018 04:48 PM   Modules accepted: Orders

## 2018-04-29 NOTE — Progress Notes (Signed)
HPI IOV 01/29/2018  Chief Complaint  Patient presents with  . Pulmonary Consult    self referral, abnormal CT, ? ILD from CT scan, SOB with exertion     Eduardo Fuentes presents self-referral for interstitial lung disease.  His wife Eduardo Fuentes used to be seen by me some years ago when she had surgical lung biopsy for ruling out interstitial lung disease which we did.  Patient is a non-smoker but has long suffered from acid reflux and esophageal stenosis.  He also has a sliding hiatal hernia.  He says since spring 2019 he has had insidious onset of shortness of breath that is progressive associated with some cough.  During this time he is also had worsening endogenous depression.  He was seeing a psychiatrist multiple antidepressants have failed.  Electroconvulsive therapy has been recommended and he had one on January 27, 2018 2 days ago.  During this time a chest x-ray was done as part of pre-electroconvulsive therapy evaluation and this suggested interstitial lung disease.  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  I agree with the radiologist.  The some amount of mediastinal adenopathy that is felt to be reactive.  There is also a sliding hiatal hernia.  It is described that these findings are new compared to 2014 chest x-ray.  At this point in time walking desaturation test he did not desaturate but dropped 4 points and pulse ox    [place] Integrated Comprehensive ILD Questionnaire  Symptoms: He reports shortness of breath onset gradually for the last 1 year.  It is getting worse with time.  He does have episodic dyspnea.  When he does shopping it is level 1.  When he walks on a level ground with others of his age at this level 1 walking up stairs at this level 1 but walking at his own pace at this level 2 and taking a shower at level 3.  He does have associated arthralgia and cough.  He is not  sure when the cough started.  It is present at all positions since it started it is the same as moderate in intensity.  He does cough at night.  He does bring up phlegm which is clear in color.  It gets worse when he lies down and it affects his voice and he does clear his throat.  He does not feel a tickle.  There is no wheezing.   Past Medical History : This is positive for major depression but he circled no for asthma, COPD.  Surgical no for heart failure.  Surgical no for collagen vascular disease with sleep apnea.  He did not answer for acid reflux which he does have..  Denies diabetes or thyroid disease or heart disease or pulmonary embolism or pneumonia   ROS:  .  Positive for difficulty swallowing due to his esophageal stricture history.  Does have nausea and acid reflux and snoring.  Otherwise no vomiting is positive for soreness across the body after electroconvulsive therapy 2 days ago.   FAMILY HISTORY of LUNG DISEASE: Denies family history of any pulmonary fibrosis COPD or asthma sarcoidosis   EXPOSURE HISTORY: Denies smoking cigarettes or tobacco or marijuana or cocaine.  No intravenous drug abuse.   HOME and HOBBY DETAILS : He did not list the age of the home but is lived in this home for 25 [place].  There is  no mold in the house of mildew.  The house is not damp.  Does not use a CPAP mask.  No nebulizer machine.  No use of Jacuzzi.  Nose fountain in the house.  No pet birds or gerbils.  Does not use feather pillows.  He is not sure if there is mold in the [facility] duct system.  Does not play wind instruments.  He occasionally mows the lawn   OCCUPATIONAL HISTORY (122 questions) : Positive for social gardening.  Did report that he is used feather pillows and blankets in the past but otherwise negative including working in a dusty environment   [place] (27 items): Positive for cancer chemotherapy but he does not know the details      OV  03/16/2018  Subjective:  Patient ID: Eduardo Fuentes, male , DOB: 03/08/1950 , age 52 y.o. , MRN: [id number] , ADDRESS: [place] [address]   03/16/2018 -   Chief Complaint  Patient presents with  . Follow-up    Pt started OFEV 02/27/2018.  Pt states he has been doing okay since last visit. Denies any current complaints with the OFEV. Pt states he does have SOB which is mainy with activities.   IPF [clinical diagnosis based on classic UIP scan negative serology and age and male gender; diagnosis given February 11, 2017]  HPI Eduardo Fuentes 68 y.o. -IPF follow-up.  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  He was started on nintedanib.  He started this on February 27, 2018.  Suffice tolerating it well without any problems.  He is debating about starting pulmonary rehabilitation.  Mostly does not want to pay $20 a week for this.  The other alternative is that he can go [facility] [employer] and exercise by himself without any co-pay but there is no trainer.  I asked him to look at rehab as a short-term thing with the help of a trainer and the cost of a trainer being $20 a week.  He then opened up to the idea of attending pulmonary rehabilitation.  He still and his wife both had questions about IPF and etiology.  Be on sliding hiatal hernia but not able to discover anything else.  His reflux is better controlled now with PPI.  In terms of her shortness of breath this is stable.  In terms of his cough this is stable.  He continues to deal with depression but is not suicidal or homicidal.  His walking desaturation test today shows stability.  Wife wanted some education material about IPF and nintedanib.         OV 04/29/2018  Subjective:  Patient ID: Eduardo Fuentes, male , DOB: 04/18/1950 , age 68 y.o. , MRN: [id number] , ADDRESS: [place] [address]   04/29/2018 -   Chief Complaint  Patient presents with  . Follow-up    Pt states he has had some weakness  going on which he states could be due to not eating enough with the OFEV. Pt also has had some loose stools as well as nausea.    IPF [clinical diagnosis based on classic UIP scan negative serology and age and male gender; diagnosis given February 11, 2017 and on [place] since 02/27/2018   HPI Eduardo Fuentes 68 y.o. -presents for follow-up of his IPF.  He is now doing nintedanib for 2 months.  Overall stable.  Overall tolerating nintedanib really well.  He had some low appetite.  There.  Some nausea he had and that yesterday he had some diarrhea after eating pizza.  He thinks it is a pizza that caused the diarrhea not the nintedanib.  In terms of his dyspnea he stable quality of life is stable.  He said he is doing well with pulmonary rehabilitation.  Maintain his pulse ox 90% or so.  He is asking if the nintedanib is helping him in terms of disease stability.  Discussed that this might not be known fully in an individual.  His mood is better      Results for Eduardo Fuentes, Eduardo Fuentes (MRN [id number]) as of 03/16/2018 14:30  Ref. Range 02/11/2018 13:52  FVC-Pre Latest Units: L 2.90  FVC-%Pred-Pre Latest Units: % 67   Results for Eduardo Fuentes, Eduardo Fuentes (MRN [id number]) as of 03/16/2018 14:30  Ref. Range 02/11/2018 13:52  TLC Latest Units: L 3.41  TLC % pred Latest Units: % 50      01/29/2018  03/16/2018 190# 04/29/2018 193#  O2 used Room air rooom air Room air  Number laps completed 3 3 3   Comments about pace 250 feet x 3 laps on POD B at [employer] pace Normal pace  Resting Pulse Ox/HR 100% and 89/min 100% and 110/min 96% and 93/min  Final Pulse Ox/HR 96% and 109/min 97% and 118/min 91% and 107/min  Desaturated </= 88% no no no  Desaturated <= 3% points Yes, 4poin Yes, 3 poitns Yes, 5 points  Got Tachycardic >/= 90/min yes yes yes  Symptoms at end of test no Mild dyspnea Mild dyspnea  Miscellaneous comments no x x   Results for Eduardo Fuentes, Eduardo Fuentes (MRN [id number]) as of 03/16/2018 14:30  Ref.  Range 01/29/2018 12:42  ASPERGILLUS FUMIGATUS Latest Ref Range: NEGATIVE  NEGATIVE  [place] Serum Latest Ref Range: NEGATIVE  NEGATIVE  Anti Nuclear Antibody(ANA) Latest Ref Range: NEGATIVE  POSITIVE (A)  ANA Pattern 1 Unknown Nuclear, Homogeneous (A)  ANA Titer 1 Latest Units: titer 6:44 (H)  Cyclic Citrullin Peptide Ab Latest Units: UNITS <16  RA Latex Turbid. Latest Ref Range: <14 IU/mL <14   ROS - per HPI  Results for Eduardo Fuentes, Eduardo Fuentes (MRN [id number]) as of 03/16/2018 14:54  Ref. Range 03/08/2018 11:50  AST Latest Ref Range: 0 - 37 U/L 23  ALT Latest Ref Range: 0 - 53 U/L 15  Total Protein Latest Ref Range: 6.0 - 8.3 g/dL 7.2    ROS - per HPI     has a past medical history of Arthritis, Cough, Depressive disorder, Diverticulosis, Diverticulosis of colon, Generalized anxiety disorder, GERD (gastroesophageal reflux disease), Hemorrhoid, High blood pressure, History of colon polyps, History of kidney stones, Microscopic hematuria, Nocturia, Panic attacks, Prostate cancer ([place]), Sinus infection, and Tinnitus.   reports that he has never smoked. He has never used smokeless tobacco.  Past Surgical History:  Procedure Laterality Date  . COLONOSCOPY    . ESOPHAGOGASTRODUODENOSCOPY    . EXTRACORPOREAL SHOCK WAVE LITHOTRIPSY  YRS AGO  . PROSTATE BIOPSY    . RADIOACTIVE SEED IMPLANT N/A 02/02/2013   Procedure: RADIOACTIVE SEED IMPLANT;  Surgeon: Molli Hazard, MD;  Location: [facility];  Service: Urology;  Laterality: N/A;  . TONSILLECTOMY AND ADENOIDECTOMY  AS CHILD    Allergies  Allergen Reactions  . Aleve [Naproxen Sodium] Anaphylaxis and Other (See Comments)    GI UPSET Patient does not remember anaphylaxis  . Hyzaar [Losartan Potassium-Hctz] Other (See Comments)    Felt bad   . Lisinopril  Cough  . Demerol [Meperidine] Rash         Immunization History  Administered Date(s) Administered  . Influenza Inj Mdck Quad Pf 11/09/2017  . Pneumococcal  Conjugate-13 10/26/2015  . Pneumococcal Polysaccharide-23 10/29/2016    Family History  Problem Relation Age of Onset  . Dementia Mother   . Anxiety disorder Mother   . Alzheimer's disease Mother   . Heart disease Father   . Bone cancer Father   . Cancer Father        prostate  . Anxiety disorder Sister   . Colon cancer Neg Hx   . Esophageal cancer Neg Hx   . Rectal cancer Neg Hx   . Stomach cancer Neg Hx      Current Outpatient Medications:  .  clonazePAM (KLONOPIN) 1 MG tablet, Take 1 tablet at 8 am, 1/2 tablet at 2 pm and 1 tablet at bedtime for anxiety. (Patient taking differently: Take 1 mg by mouth 3 (three) times daily. Pt states "I usually take 1 tablet three times a day"), Disp: 75 tablet, Rfl: 0 .  gabapentin (NEURONTIN) 300 MG capsule, Take 300 mg by mouth once., Disp: , Rfl:  .  hydrochlorothiazide (MICROZIDE) 12.5 MG capsule, Take 12.5 mg by mouth daily., Disp: , Rfl:  .  mirtazapine (REMERON) 30 MG tablet, TK 1 T PO QHS, Disp: , Rfl: 4 .  nortriptyline (PAMELOR) 50 MG capsule, Take 25 mg by mouth daily at 8 pm. , Disp: , Rfl:  .  OFEV 150 MG CAPS, Take 150 mg by mouth 2 (two) times daily. , Disp: , Rfl:  .  pantoprazole (PROTONIX) 40 MG tablet, TAKE 1 TABLET BY MOUTH DAILY, Disp: 30 tablet, Rfl: 6 .  NEOMYCIN-POLYMYXIN-HYDROCORTISONE (CORTISPORIN) 1 % SOLN OTIC solution, Apply to bed after soaking (Patient not taking: Reported on 04/29/2018), Disp: 10 mL, Rfl: 0 .  ondansetron (ZOFRAN) 4 MG tablet, Take 1 tablet (4 mg total) by mouth every 8 (eight) hours as needed for nausea or vomiting. (Patient not taking: Reported on 04/29/2018), Disp: 20 tablet, Rfl: 0      Objective:   Vitals:   04/29/18 1542  BP: 126/74  Pulse: 93  SpO2: 96%  Weight: 193 lb 12.8 oz (87.9 kg)  Height: 5\' 9"  (1.753 m)    Estimated body mass index is 28.62 kg/m as calculated from the following:   Height as of this encounter: 5\' 9"  (1.753 m).   Weight as of this encounter: 193 lb 12.8 oz  (87.9 kg).  @WEIGHTCHANGE @  [employer]   04/29/18 1542  Weight: 193 lb 12.8 oz (87.9 kg)     Physical Exam  General Appearance:    Alert, cooperative, no distress, appears stated age -yes , Deconditioned looking - NO , OBESE  - no, Sitting on Wheelchair -  no  Head:    Normocephalic, without obvious abnormality, atraumatic  Eyes:    PERRL, conjunctiva/corneas clear,  Ears:    Normal TM's and external ear canals, both ears  Nose:   Nares normal, septum midline, mucosa normal, no drainage    or sinus tenderness. OXYGEN ON  - no . Patient is @ ra   Throat:   Lips, mucosa, and tongue normal; teeth and gums normal. Cyanosis on lips - no  Neck:   Supple, symmetrical, trachea midline, no adenopathy;    thyroid:  no enlargement/tenderness/nodules; no carotid   bruit or JVD  Back:     Symmetric, no curvature, ROM normal, no CVA  tenderness  Lungs:     Distress - no , Wheeze no, Barrell Chest - no, Purse lip breathing - no, Crackles - yes at base   Chest Wall:    No tenderness or deformity.    Heart:    Regular rate and rhythm, S1 and S2 normal, no rub   or gallop, Murmur - no  Breast Exam:    NOT DONE  Abdomen:     Soft, non-tender, bowel sounds active all four quadrants,    no masses, no organomegaly. Visceral obesity - no  Genitalia:   NOT DONE  Rectal:   NOT DONE  Extremities:   Extremities - normal, Has Cane - no, Clubbing - no, Edema - no  Pulses:   2+ and symmetric all extremities  Skin:   Stigmata of Connective Tissue Disease - no  Lymph nodes:   Cervical, supraclavicular, and axillary nodes normal  Psychiatric:  Neurologic:   Pleasant - yes, Anxious - no, Flat affect - YES  CAm-ICU - neg, Alert and Oriented x 3 - yes, Moves all 4s - yes, Speech - normal, Cognition - intact           Assessment:       ICD-10-CM   1. IPF (idiopathic pulmonary fibrosis) ([place]) J84.112   2. Encounter for therapeutic drug monitoring Z51.81   3. Gastroesophageal reflux disease, esophagitis  presence not specified K21.9        Plan:     Patient Instructions     ICD-10-CM   1. IPF (idiopathic pulmonary fibrosis) ([place]) J84.112   2. Encounter for therapeutic drug monitoring Z51.81      Diagnosis is IPF - unknown reason but likely hiatal hernia played ar ole  - diagnosis given 02/11/18  Glad you are on ofev since 02/27/2018 and so far tolerating it well and LFT normnal feb 2020  Glad you are doing well in pulmonary rehabilitation  Plan  -continue rehab  - continue ofev   - space atleast 10h  - careful with foods you eat and give you diarrhea  - can do immodium  As needed  - check LFT 04/29/2018 - cotninue protonix  -In the future we can discuss a little bit more about patient support group and  research trials   Follow-up 6 weeks do LFT check 6 weeks do spirometry and dlco 6 weeks return to ILD clinic   > 50% of this > 25 min visit spent in face to face counseling or coordination of care - by this undersigned MD - Dr Brand Males. This includes one or more of the following documented above: discussion of test results, diagnostic or treatment recommendations, prognosis, risks and benefits of management options, instructions, education, compliance or risk-factor reduction   SIGNATURE    Dr. Brand Males, M.D., F.C.C.P,  Pulmonary and Critical Care Medicine Staff Physician, [place] Director - Interstitial Lung Disease  Program  Pulmonary [place] [facility] [place], [place], [address]  Pager: [phone], If no answer or between  15:00h - 7:00h: call [phone] Telephone: [phone]  4:40 PM 04/29/2018

## 2018-04-30 LAB — HEPATIC FUNCTION PANEL
ALT: 22 U/L (ref 0–53)
AST: 28 U/L (ref 0–37)
Albumin: 4.3 g/dL (ref 3.5–5.2)
Alkaline Phosphatase: 120 U/L — ABNORMAL HIGH (ref 39–117)
Bilirubin, Direct: 0.1 mg/dL (ref 0.0–0.3)
Total Bilirubin: 0.6 mg/dL (ref 0.2–1.2)
Total Protein: 7.6 g/dL (ref 6.0–8.3)

## 2018-05-04 ENCOUNTER — Encounter (HOSPITAL_COMMUNITY)
Admission: RE | Admit: 2018-05-04 | Discharge: 2018-05-04 | Disposition: A | Discharge status: Home / Self Care | Payer: PPO | Source: Ambulatory Visit | Attending: Internal Medicine | Admitting: Internal Medicine

## 2018-05-04 ENCOUNTER — Ambulatory Visit (HOSPITAL_COMMUNITY): Payer: Self-pay

## 2018-05-04 VITALS — Wt 191.1 lb

## 2018-05-04 DIAGNOSIS — J849 Interstitial pulmonary disease, unspecified: Secondary | ICD-10-CM | POA: Diagnosis not present

## 2018-05-04 NOTE — Progress Notes (Signed)
Daily Session Note  Patient Details  Name: Eduardo Fuentes MRN: [id number] Date of Birth: 02-Dec-1950 Referring Provider:     Pulmonary Rehab Walk Test from 04/06/2018 in [place]  Referring Provider  Dr. Chase Caller      Encounter Date: 05/04/2018  Check In: Session Check In - 05/04/18 1119      Check-In   Supervising physician immediately available to respond to emergencies  [facility] immediately available    Physician(s)  Dr. Jonnie Finner    Location  [facility]    Staff Present  Rosebud Poles, RN, BSN;Dereck Agerton Ysidro Evert, RN;Molly DiVincenzo, MS, ACSM RCEP, Exercise Physiologist;Dalton Kris Mouton, MS, Exercise Physiologist;Other    Medication changes reported      No    Fall or balance concerns reported     No    Tobacco Cessation  No Change    Warm-up and Cool-down  Not performed (comment)    Resistance Training Performed  Yes    VAD Patient?  No    PAD/SET Patient?  No      Pain Assessment   Currently in Pain?  No/denies    Multiple Pain Sites  No       Capillary Blood Glucose: No results found for this or any previous visit (from the past 24 hour(s)).    Social History   Tobacco Use  Smoking Status Never Smoker  Smokeless Tobacco Never Used    Goals Met:  Exercise tolerated well No report of cardiac concerns or symptoms Strength training completed today  Goals Unmet:  Not Applicable  Comments: Service time is from 1030 to 1215    Dr. Rush Farmer is Medical Director for Pulmonary Rehab at [facility].

## 2018-05-04 NOTE — Progress Notes (Signed)
I have reviewed a Home Exercise Prescription with Eduardo Fuentes . Eduardo Fuentes is  currently exercising at home.  The patient was advised to walk on the treadmill and use the stationary bike 3 days a week for 30-45 minutes.  Eduardo Fuentes and I discussed how to progress their exercise prescription.  The patient stated that their goals were improve his functioning and to slow his disease process.  The patient stated that they understand the exercise prescription.  We reviewed exercise guidelines, target heart rate during exercise, RPE Scale, weather conditions, NTG use, endpoints for exercise, warmup and cool down.  Patient is encouraged to come to me with any questions. I will continue to follow up with the patient to assist them with progression and safety.

## 2018-05-05 ENCOUNTER — Other Ambulatory Visit: Payer: Self-pay

## 2018-05-05 ENCOUNTER — Ambulatory Visit (INDEPENDENT_AMBULATORY_CARE_PROVIDER_SITE_OTHER): Payer: PPO | Admitting: Podiatry

## 2018-05-05 ENCOUNTER — Encounter: Payer: Self-pay | Admitting: Podiatry

## 2018-05-05 DIAGNOSIS — L600 Ingrowing nail: Secondary | ICD-10-CM

## 2018-05-05 NOTE — Patient Instructions (Signed)

## 2018-05-06 ENCOUNTER — Encounter (HOSPITAL_COMMUNITY): Payer: PPO

## 2018-05-06 ENCOUNTER — Ambulatory Visit (HOSPITAL_COMMUNITY): Payer: Self-pay

## 2018-05-06 NOTE — Progress Notes (Signed)
Subjective:   Patient ID: Eduardo Fuentes, male   DOB: 68 y.o.   MRN: [id number]   HPI Patient states the right big toe the side that we did the ingrown toenail on is doing well but the lateral side is quite incurvated and sore.  The left hallux nail is healing better with slight redness noted but normal appearance for this.  Postop   ROS      Objective:  Physical Exam  Ingrown toenail deformity right hallux lateral border that is painful and well-healed nail sites are remaining nails that have been done previously     Assessment:  I did note incurvated nail bed that is painful but I did not note any redness in the corner of the toe and the other ones are healing uneventfully     Plan:  Reviewed conditions and I do think ingrown toenail removal is necessary due to the pain and he wants this done and signed consent form after review.  Today I infiltrated the right hallux 60 mg like Marcaine mixture sterile prep applied and using sterile instrumentation I removed the lateral border exposed matrix and applied phenol 3 applications 36 followed out lavage and sterile dressing all under sterile technique with sterile instrumentation.  I then applied sterile dressing and gave instructions on soaks and encouraged him to leave dressing on 24 hours but to take it off earlier if necessary and explained to him drop usage and to call if any issues were to occur

## 2018-05-10 ENCOUNTER — Telehealth (HOSPITAL_COMMUNITY): Payer: Self-pay

## 2018-05-10 NOTE — Telephone Encounter (Signed)
Pt called informing pt of Cardiac and Pulmonary rehab closure for the next 2 weeks.   Ferrell Flam RN, BSN Cardiac and Pulmonary Rehab RN  

## 2018-05-11 ENCOUNTER — Ambulatory Visit (HOSPITAL_COMMUNITY): Payer: Self-pay

## 2018-05-11 ENCOUNTER — Encounter (HOSPITAL_COMMUNITY): Payer: PPO

## 2018-05-13 ENCOUNTER — Encounter (HOSPITAL_COMMUNITY): Payer: PPO

## 2018-05-13 ENCOUNTER — Ambulatory Visit (HOSPITAL_COMMUNITY): Payer: Self-pay

## 2018-05-17 ENCOUNTER — Encounter (HOSPITAL_COMMUNITY): Payer: Self-pay

## 2018-05-17 NOTE — Progress Notes (Signed)
Pulmonary Individual Treatment Plan  Patient Details  Name: Eduardo Fuentes MRN: [id number] Date of Birth: August 28, 1950 Referring Provider:     Pulmonary Rehab Walk Test from 04/06/2018 in [facility]  Referring Provider  Dr. Chase Caller      Initial Encounter Date:    Pulmonary Rehab Walk Test from 04/06/2018 in [facility]  Date  04/06/18      Visit Diagnosis: Interstitial lung disease ([place])  Patient's Home Medications on Admission:   Current Outpatient Medications:  .  clonazePAM (KLONOPIN) 1 MG tablet, Take 1 tablet at 8 am, 1/2 tablet at 2 pm and 1 tablet at bedtime for anxiety. (Patient taking differently: Take 1 mg by mouth 3 (three) times daily. Pt states "I usually take 1 tablet three times a day"), Disp: 75 tablet, Rfl: 0 .  gabapentin (NEURONTIN) 300 MG capsule, Take 300 mg by mouth once., Disp: , Rfl:  .  hydrochlorothiazide (MICROZIDE) 12.5 MG capsule, Take 12.5 mg by mouth daily., Disp: , Rfl:  .  mirtazapine (REMERON) 30 MG tablet, TK 1 T PO QHS, Disp: , Rfl: 4 .  NEOMYCIN-POLYMYXIN-HYDROCORTISONE (CORTISPORIN) 1 % SOLN OTIC solution, Apply to bed after soaking, Disp: 10 mL, Rfl: 0 .  nortriptyline (PAMELOR) 50 MG capsule, Take 25 mg by mouth daily at 8 pm. , Disp: , Rfl:  .  OFEV 150 MG CAPS, Take 150 mg by mouth 2 (two) times daily. , Disp: , Rfl:  .  ondansetron (ZOFRAN) 4 MG tablet, Take 1 tablet (4 mg total) by mouth every 8 (eight) hours as needed for nausea or vomiting., Disp: 20 tablet, Rfl: 0 .  pantoprazole (PROTONIX) 40 MG tablet, TAKE 1 TABLET BY MOUTH DAILY, Disp: 30 tablet, Rfl: 6  Past Medical History: Past Medical History:  Diagnosis Date  . Arthritis    NECK  . Cough    NON-PRODUCTIVE  . Depressive disorder    follow by dr [facility]  . Diverticulosis   . Diverticulosis of colon   . Generalized anxiety disorder   . GERD (gastroesophageal reflux disease)    WATCHES DIET  .  Hemorrhoid   . High blood pressure   . History of colon polyps   . History of kidney stones   . Microscopic hematuria   . Nocturia   . Panic attacks   . Prostate cancer ([place])   . Sinus infection   . Tinnitus    LEFT EAR -- CHRONIC    Tobacco Use: Social History   Tobacco Use  Smoking Status Never Smoker  Smokeless Tobacco Never Used    Labs: Recent Review Scientist, physiological    Labs for ITP Cardiac and Pulmonary Rehab Latest Ref Rng & Units 10/29/2011   Cholestrol 0 - 200 mg/dL 162   LDLCALC 0 - 99 mg/dL 101(H)   HDL >39 mg/dL 42   Trlycerides <150 mg/dL 94      Capillary Blood Glucose: No results found for: GLUCAP   Pulmonary Assessment Scores: Pulmonary Assessment Scores    Row Name 04/05/18 0955 04/06/18 1534       ADL UCSD   ADL Phase  Entry  -    SOB Score total  36  -      CAT Score   CAT Score  pre 11  -      mMRC Score   mMRC Score  -  1       Pulmonary Function Assessment: Pulmonary Function Assessment -  04/05/18 0950      Breath   Bilateral Breath Sounds  Clear    Shortness of Breath  Yes;Limiting activity;Fear of Shortness of Breath       Exercise Target Goals: Exercise Program Goal: Individual exercise prescription set using results from initial 6 min walk test and THRR while considering  patient's activity barriers and safety.   Exercise Prescription Goal: Initial exercise prescription builds to 30-45 minutes a day of aerobic activity, 2-3 days per week.  Home exercise guidelines will be given to patient during program as part of exercise prescription that the participant will acknowledge.  Activity Barriers & Risk Stratification: Activity Barriers & Cardiac Risk Stratification - 04/05/18 0944      Activity Barriers & Cardiac Risk Stratification   Activity Barriers  None       6 Minute Walk: 6 Minute Walk    Row Name 04/06/18 1524         6 Minute Walk   Phase  Initial     Distance  1229 feet     Walk Time  6 minutes     # of  Rest Breaks  0     MPH  2.34     METS  2.76     RPE  11     Perceived Dyspnea   1     VO2 Peak  10.4     Symptoms  No     Resting HR  94 bpm     Resting BP  112/80     Resting Oxygen Saturation   97 %     Exercise Oxygen Saturation  during 6 min walk  91 %     Max Ex. HR  112 bpm     Max Ex. BP  132/82     2 Minute Post BP  120/80       Interval HR   1 Minute HR  109     2 Minute HR  110     3 Minute HR  112     4 Minute HR  110     5 Minute HR  109     6 Minute HR  111     2 Minute Post HR  100     Interval Heart Rate?  Yes       Interval Oxygen   Interval Oxygen?  Yes     Baseline Oxygen Saturation %  97 %     1 Minute Oxygen Saturation %  92 %     1 Minute Liters of Oxygen  0 L     2 Minute Oxygen Saturation %  92 %     2 Minute Liters of Oxygen  0 L     3 Minute Oxygen Saturation %  91 %     3 Minute Liters of Oxygen  0 L     4 Minute Oxygen Saturation %  91 %     4 Minute Liters of Oxygen  0 L     5 Minute Oxygen Saturation %  91 %     5 Minute Liters of Oxygen  0 L     6 Minute Oxygen Saturation %  92 %     6 Minute Liters of Oxygen  0 L     2 Minute Post Oxygen Saturation %  94 %     2 Minute Post Liters of Oxygen  0 L        Oxygen Initial Assessment:  Oxygen Initial Assessment - 04/05/18 0949      Home Oxygen   Home Oxygen Device  None    Sleep Oxygen Prescription  None    Home Exercise Oxygen Prescription  None    Home at Rest Exercise Oxygen Prescription  None       Oxygen Re-Evaluation: Oxygen Re-Evaluation    Row Name 04/27/18 0819 05/17/18 1017           Program Oxygen Prescription   Program Oxygen Prescription  None  None        Home Oxygen   Home Oxygen Device  None  None      Sleep Oxygen Prescription  None  None      Home Exercise Oxygen Prescription  None  None      Home at Rest Exercise Oxygen Prescription  None  None        Goals/Expected Outcomes   Short Term Goals  To learn and exhibit compliance with exercise, home and  travel O2 prescription;To learn and understand importance of monitoring SPO2 with pulse oximeter and demonstrate accurate use of the pulse oximeter.;To learn and understand importance of maintaining oxygen saturations>88%;To learn and demonstrate proper pursed lip breathing techniques or other breathing techniques.;To learn and demonstrate proper use of respiratory medications  To learn and exhibit compliance with exercise, home and travel O2 prescription;To learn and understand importance of monitoring SPO2 with pulse oximeter and demonstrate accurate use of the pulse oximeter.;To learn and understand importance of maintaining oxygen saturations>88%;To learn and demonstrate proper pursed lip breathing techniques or other breathing techniques.;To learn and demonstrate proper use of respiratory medications      Long  Term Goals  Exhibits compliance with exercise, home and travel O2 prescription;Verbalizes importance of monitoring SPO2 with pulse oximeter and return demonstration;Maintenance of O2 saturations>88%;Compliance with respiratory medication;Demonstrates proper use of MDI's;Exhibits proper breathing techniques, such as pursed lip breathing or other method taught during program session  Exhibits compliance with exercise, home and travel O2 prescription;Verbalizes importance of monitoring SPO2 with pulse oximeter and return demonstration;Maintenance of O2 saturations>88%;Compliance with respiratory medication;Demonstrates proper use of MDI's;Exhibits proper breathing techniques, such as pursed lip breathing or other method taught during program session      Goals/Expected Outcomes  compliance   compliance          Oxygen Discharge (Final Oxygen Re-Evaluation): Oxygen Re-Evaluation - 05/17/18 1017      Program Oxygen Prescription   Program Oxygen Prescription  None      Home Oxygen   Home Oxygen Device  None    Sleep Oxygen Prescription  None    Home Exercise Oxygen Prescription  None     Home at Rest Exercise Oxygen Prescription  None      Goals/Expected Outcomes   Short Term Goals  To learn and exhibit compliance with exercise, home and travel O2 prescription;To learn and understand importance of monitoring SPO2 with pulse oximeter and demonstrate accurate use of the pulse oximeter.;To learn and understand importance of maintaining oxygen saturations>88%;To learn and demonstrate proper pursed lip breathing techniques or other breathing techniques.;To learn and demonstrate proper use of respiratory medications    Long  Term Goals  Exhibits compliance with exercise, home and travel O2 prescription;Verbalizes importance of monitoring SPO2 with pulse oximeter and return demonstration;Maintenance of O2 saturations>88%;Compliance with respiratory medication;Demonstrates proper use of MDI's;Exhibits proper breathing techniques, such as pursed lip breathing or other method taught during program session    Goals/Expected Outcomes  compliance  Initial Exercise Prescription: Initial Exercise Prescription - 04/06/18 1500      Date of Initial Exercise RX and Referring Provider   Date  04/06/18    Referring Provider  Dr. Chase Caller      Treadmill   MPH  1.8    Grade  2    Minutes  17      Bike   Level  0.5    Minutes  17      NuStep   Level  2    SPM  80    Minutes  17      Prescription Details   Frequency (times per week)  2    Duration  Progress to 45 minutes of aerobic exercise without signs/symptoms of physical distress      Intensity   THRR 40-80% of Max Heartrate  61-122    Ratings of Perceived Exertion  11-13    Perceived Dyspnea  0-4      Progression   Progression  Continue to progress workloads to maintain intensity without signs/symptoms of physical distress.      Resistance Training   Training Prescription  Yes    Weight  orange bands    Reps  10-15       Perform Capillary Blood Glucose checks as needed.  Exercise Prescription  Changes: Exercise Prescription Changes    Row Name 04/13/18 1200 04/27/18 1200 05/04/18 1600         Response to Exercise   Blood Pressure (Admit)  122/84  102/58  112/70     Blood Pressure (Exercise)  124/78  100/74  126/70     Blood Pressure (Exit)  106/66  100/66  100/76     Heart Rate (Admit)  94 bpm  99 bpm  108 bpm     Heart Rate (Exercise)  119 bpm  126 bpm  124 bpm     Heart Rate (Exit)  94 bpm  87 bpm  106 bpm     Oxygen Saturation (Admit)  90 %  96 %  95 %     Oxygen Saturation (Exercise)  90 %  90 %  90 %     Oxygen Saturation (Exit)  94 %  95 %  95 %     Rating of Perceived Exertion (Exercise)  '13  13  13     '$ Perceived Dyspnea (Exercise)  '1  1  1     '$ Duration  Progress to 45 minutes of aerobic exercise without signs/symptoms of physical distress  Progress to 45 minutes of aerobic exercise without signs/symptoms of physical distress  Progress to 45 minutes of aerobic exercise without signs/symptoms of physical distress     Intensity  Other (comment) 40-80% of HRR  Other (comment) 40-80% of HRR  THRR unchanged       Progression   Progression  Continue to progress workloads to maintain intensity without signs/symptoms of physical distress.  Continue to progress workloads to maintain intensity without signs/symptoms of physical distress.  Continue to progress workloads to maintain intensity without signs/symptoms of physical distress.       Resistance Training   Training Prescription  Yes  Yes  Yes     Weight  orange bands  orange bands  orange bands     Reps  10-15  10-15  10-15     Time  10 Minutes  10 Minutes  10 Minutes       Treadmill   MPH  1.6  1.6  2  Grade  '2  2  2     '$ Minutes  '17  17  17       '$ Bike   Level  0.4  0.5  0.5     Minutes  '17  17  17       '$ NuStep   Level  '2  3  3     '$ SPM  80  80  80     Minutes  '17  17  17     '$ METs  2  2.1  2       Home Exercise Plan   Plans to continue exercise at  -  -  [employer] (comment) [employer]      Frequency  -  -  Add 3 additional days to program exercise sessions.     Initial Home Exercises Provided  -  -  05/04/18        Exercise Comments: Exercise Comments    Row Name 05/04/18 1608           Exercise Comments  home exercise complete          Exercise Goals and Review:   Exercise Goals Re-Evaluation : Exercise Goals Re-Evaluation    [place] Name 04/27/18 0820 05/17/18 1017           Exercise Goal Re-Evaluation   Exercise Goals Review  Increase Physical Activity;Increase Strength and Stamina;Able to understand and use rate of perceived exertion (RPE) scale;Able to understand and use Dyspnea scale;Knowledge and understanding of Target Heart Rate Range (THRR);Understanding of Exercise Prescription  Increase Physical Activity;Increase Strength and Stamina;Able to understand and use rate of perceived exertion (RPE) scale;Able to understand and use Dyspnea scale;Knowledge and understanding of Target Heart Rate Range (THRR);Understanding of Exercise Prescription      Comments  Pt has completed 3 exercise sessions and is motivated to exercise. Pt is currently exercising at 2.87 METs while on the treadmill. I expect the pt to progress well through the program. Will continue to monitor and progress as able.   Pt exercises at 3.1 METs and is motivated to progress through the program. Our department is currently closed and will be for an unknown period of time. Pt has previously been instructed on home exercise. Will call to follow up and give updates to pt later in the week after I recieve them.       Expected Outcomes  Through exercise at rehab and at home, the patient will decrease shortness of breath with daily activities and feel confident in carrying out an exercise regime at home.   Through exercise at rehab and at home, the patient will decrease shortness of breath with daily activities and feel confident in carrying out an exercise regime at home.          Discharge Exercise  Prescription (Final Exercise Prescription Changes): Exercise Prescription Changes - 05/04/18 1600      Response to Exercise   Blood Pressure (Admit)  112/70    Blood Pressure (Exercise)  126/70    Blood Pressure (Exit)  100/76    Heart Rate (Admit)  108 bpm    Heart Rate (Exercise)  124 bpm    Heart Rate (Exit)  106 bpm    Oxygen Saturation (Admit)  95 %    Oxygen Saturation (Exercise)  90 %    Oxygen Saturation (Exit)  95 %    Rating of Perceived Exertion (Exercise)  13    Perceived Dyspnea (Exercise)  1  Duration  Progress to 45 minutes of aerobic exercise without signs/symptoms of physical distress    Intensity  THRR unchanged      Progression   Progression  Continue to progress workloads to maintain intensity without signs/symptoms of physical distress.      Resistance Training   Training Prescription  Yes    Weight  orange bands    Reps  10-15    Time  10 Minutes      Treadmill   MPH  2    Grade  2    Minutes  17      Bike   Level  0.5    Minutes  17      NuStep   Level  3    SPM  80    Minutes  17    METs  2      Home Exercise Plan   Plans to continue exercise at  [employer] (comment)   [employer]   Frequency  Add 3 additional days to program exercise sessions.    Initial Home Exercises Provided  05/04/18       Nutrition:  Target Goals: Understanding of nutrition guidelines, daily intake of sodium '1500mg'$ , cholesterol '200mg'$ , calories 30% from fat and 7% or less from saturated fats, daily to have 5 or more servings of fruits and vegetables.  Biometrics: Pre Biometrics - 04/05/18 0944      Pre Biometrics   Grip Strength  40 kg        Nutrition Therapy Plan and Nutrition Goals: Nutrition Therapy & Goals - 04/09/18 1213      Nutrition Therapy   Diet  general healthful      Personal Nutrition Goals   Nutrition Goal  Pt to identify and limit food sources of sodium.    Personal Goal #2  Identify food quantities necessary to achieve  wt loss of  -2# per week to a goal wt loss of 2.7-10.9 kg (6-24 lb) at graduation from pulmonary rehab    Personal Goal #3  Pt to weigh and measure portion sizes for accuracy     Personal Goal #4  Pt to build a healthy plate including fruits, vegetables, whole grains, and low-fat dairy products in a healthy meal plan      Intervention Plan   Intervention  Prescribe, educate and counsel regarding individualized specific dietary modifications aiming towards targeted core components such as weight, hypertension, lipid management, diabetes, heart failure and other comorbidities.    Expected Outcomes  Short Term Goal: Understand basic principles of dietary content, such as calories, fat, sodium, cholesterol and nutrients.;Long Term Goal: Adherence to prescribed nutrition plan.       Nutrition Assessments: Nutrition Assessments - 04/09/18 1217      Rate Your Plate Scores   Pre Score  49       Nutrition Goals Re-Evaluation: Nutrition Goals Re-Evaluation    Row Name 04/09/18 1213             Goals   Current Weight  193 lb 5.5 oz (87.7 kg)          Nutrition Goals Discharge (Final Nutrition Goals Re-Evaluation): Nutrition Goals Re-Evaluation - 04/09/18 1213      Goals   Current Weight  193 lb 5.5 oz (87.7 kg)       Psychosocial: Target Goals: Acknowledge presence or absence of significant depression and/or stress, maximize coping skills, provide positive support system. Participant is able to verbalize types and ability to use  techniques and skills needed for reducing stress and depression.  Initial Review & Psychosocial Screening: Initial Psych Review & Screening - 04/05/18 1001      Initial Review   Current issues with  Current Psychotropic Meds;History of Depression;Current Depression   on antidepressant, sees a psychiatrist     [place]?  Yes      Barriers   Psychosocial barriers to participate in program  The patient should benefit from  training in stress management and relaxation.      Screening Interventions   Interventions  Encouraged to exercise    Expected Outcomes  Short Term goal: Utilizing psychosocial counselor, staff and physician to assist with identification of specific Stressors or current issues interfering with healing process. Setting desired goal for each stressor or current issue identified.       Quality of Life Scores:  Scores of 19 and below usually indicate a poorer quality of life in these areas.  A difference of  2-3 points is a clinically meaningful difference.  A difference of 2-3 points in the total score of the Quality of Life Index has been associated with significant improvement in overall quality of life, self-image, physical symptoms, and general health in studies assessing change in quality of life.  PHQ-9: Recent Review Flowsheet Data    Depression screen [facility] 2/9 04/05/2018   Decreased Interest 0   Down, Depressed, Hopeless 0   PHQ - 2 Score 0   Altered sleeping 0   Tired, decreased energy 2   Change in appetite 0   Feeling bad or failure about yourself  0   Trouble concentrating 0   Moving slowly or fidgety/restless 0   Suicidal thoughts 0   Difficult doing work/chores Not difficult at all     Interpretation of Total Score  Total Score Depression Severity:  1-4 = Minimal depression, 5-9 = Mild depression, 10-14 = Moderate depression, 15-19 = Moderately severe depression, 20-27 = Severe depression   Psychosocial Evaluation and Intervention: Psychosocial Evaluation - 04/27/18 0750      Psychosocial Evaluation & Interventions   Interventions  Stress management education;Relaxation education;Encouraged to exercise with the program and follow exercise prescription    Comments  Pt is being seen by a psychiatrist for a history of depression, but states that this helps him. He had been prescribed medications, which has been successful and he fells alot better.     Expected Outcomes  Pt  will remain under psychiatrist care with the coordination of PCP to ensure effectiveness of treatment. Pt will also report continued sucess and will report changes to staff or physician care    Continue Psychosocial Services   Follow up required by staff       Psychosocial Re-Evaluation: Psychosocial Re-Evaluation    [place] Name 04/27/18 0756 04/27/18 0801 05/17/18 1421         Psychosocial Re-Evaluation   Current issues with  Current Depression;History of Depression;Current Psychotropic Meds  Current Depression;History of Depression;Current Psychotropic Meds  Current Depression;History of Depression;Current Psychotropic Meds     Comments  -  Pt is being seen by a psychiatrist for a history of depression, but states that this helps him. He had been prescribed medications, which has been successful and he fells alot better.   Pt continue to be seen by a psychiatrist for a history of depression, and states that this helps him. He had been prescribed medications, which has been successful and he fells alot better.vRehab is  currently closed and will plan to re-evaulate pt progress upon reopening.      Expected Outcomes  -  Pt will remain under care of PCP and psychiatrist and will report changes to staff. Pt will remain free of barriers to participation under current treatment.  Pt will remain under care of PCP and psychiatrist and will report changes to staff. Pt will remain free of barriers to participation under current treatment.     Interventions  Stress management education;Relaxation education;Encouraged to attend Pulmonary Rehabilitation for the exercise  Stress management education;Relaxation education;Encouraged to attend Pulmonary Rehabilitation for the exercise  Stress management education;Relaxation education;Encouraged to attend Pulmonary Rehabilitation for the exercise     Continue Psychosocial Services   Follow up required by staff  Follow up required by staff  Follow up required by staff         Psychosocial Discharge (Final Psychosocial Re-Evaluation): Psychosocial Re-Evaluation - 05/17/18 1421      Psychosocial Re-Evaluation   Current issues with  Current Depression;History of Depression;Current Psychotropic Meds    Comments  Pt continue to be seen by a psychiatrist for a history of depression, and states that this helps him. He had been prescribed medications, which has been successful and he fells alot better.vRehab is currently closed and will plan to re-evaulate pt progress upon reopening.     Expected Outcomes  Pt will remain under care of PCP and psychiatrist and will report changes to staff. Pt will remain free of barriers to participation under current treatment.    Interventions  Stress management education;Relaxation education;Encouraged to attend Pulmonary Rehabilitation for the exercise    Continue Psychosocial Services   Follow up required by staff       Education: Education Goals: Education classes will be provided on a weekly basis, covering required topics. Participant will state understanding/return demonstration of topics presented.  Learning Barriers/Preferences: Learning Barriers/Preferences - 04/05/18 1000      Learning Barriers/Preferences   Learning Barriers  None    Learning Preferences  Audio;Group Instruction;Individual Instruction;Computer/Internet;Pictoral;Skilled Demonstration;Verbal Instruction;Video;Written Material       Education Topics: Risk Factor Reduction:  -Group instruction that is supported by a PowerPoint presentation. Instructor discusses the definition of a risk factor, different risk factors for pulmonary disease, and how the heart and lungs work together.     Nutrition for Pulmonary Patient:  -Group instruction provided by PowerPoint slides, verbal discussion, and written materials to support subject matter. The instructor gives an explanation and review of healthy diet recommendations, which includes a discussion on  weight management, recommendations for fruit and vegetable consumption, as well as protein, fluid, caffeine, fiber, sodium, sugar, and alcohol. Tips for eating when patients are short of breath are discussed.   Pursed Lip Breathing:  -Group instruction that is supported by demonstration and informational handouts. Instructor discusses the benefits of pursed lip and diaphragmatic breathing and detailed demonstration on how to preform both.     Oxygen Safety:  -Group instruction provided by PowerPoint, verbal discussion, and written material to support subject matter. There is an overview of "What is Oxygen" and "Why do we need it".  Instructor also reviews how to create a safe environment for oxygen use, the importance of using oxygen as prescribed, and the risks of noncompliance. There is a brief discussion on traveling with oxygen and resources the patient may utilize.   Oxygen Equipment:  -Group instruction provided by [facility] Staff utilizing handouts, written materials, and equipment demonstrations.   Signs and Symptoms:  -  Group instruction provided by written material and verbal discussion to support subject matter. Warning signs and symptoms of infection, stroke, and heart attack are reviewed and when to call the physician/911 reinforced. Tips for preventing the spread of infection discussed.   Advanced Directives:  -Group instruction provided by verbal instruction and written material to support subject matter. Instructor reviews Advanced Directive laws and proper instruction for filling out document.   Pulmonary Video:  -Group video education that reviews the importance of medication and oxygen compliance, exercise, good nutrition, pulmonary hygiene, and pursed lip and diaphragmatic breathing for the pulmonary patient.   Exercise for the Pulmonary Patient:  -Group instruction that is supported by a PowerPoint presentation. Instructor discusses benefits of exercise, core  components of exercise, frequency, duration, and intensity of an exercise routine, importance of utilizing pulse oximetry during exercise, safety while exercising, and options of places to exercise outside of rehab.     PULMONARY REHAB OTHER RESPIRATORY from 04/15/2018 in [place]  Date  04/15/18  Educator  ep  Instruction Review Code  1- Verbalizes Understanding      Pulmonary Medications:  -Verbally interactive group education provided by instructor with focus on inhaled medications and proper administration.   Anatomy and Physiology of the Respiratory System and Intimacy:  -Group instruction provided by PowerPoint, verbal discussion, and written material to support subject matter. Instructor reviews respiratory cycle and anatomical components of the respiratory system and their functions. Instructor also reviews differences in obstructive and restrictive respiratory diseases with examples of each. Intimacy, Sex, and Sexuality differences are reviewed with a discussion on how relationships can change when diagnosed with pulmonary disease. Common sexual concerns are reviewed.   MD DAY -A group question and answer session with a medical doctor that allows participants to ask questions that relate to their pulmonary disease state.   OTHER EDUCATION -Group or individual verbal, written, or video instructions that support the educational goals of the pulmonary rehab program.   Holiday Eating Survival Tips:  -Group instruction provided by PowerPoint slides, verbal discussion, and written materials to support subject matter. The instructor gives patients tips, tricks, and techniques to help them not only survive but enjoy the holidays despite the onslaught of food that accompanies the holidays.   Knowledge Questionnaire Score: Knowledge Questionnaire Score - 04/05/18 1007      Knowledge Questionnaire Score   Pre Score  14/18       Core Components/Risk  Factors/Patient Goals at Admission: Personal Goals and Risk Factors at Admission - 04/05/18 1007      Core Components/Risk Factors/Patient Goals on Admission   Improve shortness of breath with ADL's  Yes    Intervention  Provide education, individualized exercise plan and daily activity instruction to help decrease symptoms of SOB with activities of daily living.    Expected Outcomes  Short Term: Improve cardiorespiratory fitness to achieve a reduction of symptoms when performing ADLs;Long Term: Be able to perform more ADLs without symptoms or delay the onset of symptoms       Core Components/Risk Factors/Patient Goals Review:  Goals and Risk Factor Review    Row Name 04/05/18 1008 04/27/18 0805 05/17/18 1422         Core Components/Risk Factors/Patient Goals Review   Personal Goals Review  Develop more efficient breathing techniques such as purse lipped breathing and diaphragmatic breathing and practicing self-pacing with activity.;Increase knowledge of respiratory medications and ability to use respiratory devices properly.;Improve shortness of breath with ADL's  Develop more efficient breathing techniques such as purse lipped breathing and diaphragmatic breathing and practicing self-pacing with activity.;Increase knowledge of respiratory medications and ability to use respiratory devices properly.;Improve shortness of breath with ADL's  Develop more efficient breathing techniques such as purse lipped breathing and diaphragmatic breathing and practicing self-pacing with activity.;Increase knowledge of respiratory medications and ability to use respiratory devices properly.;Improve shortness of breath with ADL's     Review  -  Brantlee is off to a great start in rehab. He has completed 3 exercise sessions and 1 education session. He has already shown improvement in his workloads of an increase to 0.5 on the airdyne, and increase to level 3 on the nustep and maintaining at 1.6/2.0 on the treadmill. Pt  also consistently reports mild SOB. Pt is able to demonstrate PLB technique with verbal cues and will hope to become more independent with this as he progresses in rehab. Will continue to monitor for progress at next 30 day review.  He has completed 5 exercise sessions and 1 education session. He has already shown improvement in his workloads with 0.5 on the airdyne, level 3 on the nustep and increasing to 2.0/2.0 on the treadmill. Pt also consistently reports mild SOB. Pt is able to demonstrate PLB technique with verbal cues and will hope to become more independent with this as he progresses in rehab. Rehab is currently closed and will plan to re-evaulate pt progress upon reopening.     Expected Outcomes  -  see admission goals and outcomes  see admission goals and outcomes        Core Components/Risk Factors/Patient Goals at Discharge (Final Review):  Goals and Risk Factor Review - 05/17/18 1422      Core Components/Risk Factors/Patient Goals Review   Personal Goals Review  Develop more efficient breathing techniques such as purse lipped breathing and diaphragmatic breathing and practicing self-pacing with activity.;Increase knowledge of respiratory medications and ability to use respiratory devices properly.;Improve shortness of breath with ADL's    Review  He has completed 5 exercise sessions and 1 education session. He has already shown improvement in his workloads with 0.5 on the airdyne, level 3 on the nustep and increasing to 2.0/2.0 on the treadmill. Pt also consistently reports mild SOB. Pt is able to demonstrate PLB technique with verbal cues and will hope to become more independent with this as he progresses in rehab. Rehab is currently closed and will plan to re-evaulate pt progress upon reopening.    Expected Outcomes  see admission goals and outcomes       ITP Comments: ITP Comments    Row Name 04/27/18 0748 05/17/18 1420         ITP Comments  Dr. Jennet Maduro, Medial Director,  Pulmonary rehab  Dr. Jennet Maduro, Medial Director, Pulmonary rehab         Comments: ITP REVIEW Pt is making expected progress toward personal goals after completing 5 sessions. Rehab is currently closed and will plan to re-evaulate pt progress upon reopening.  Joycelyn Man RN, BSN Cardiac and Pulmonary Rehab RN

## 2018-05-18 ENCOUNTER — Encounter (HOSPITAL_COMMUNITY): Payer: PPO

## 2018-05-18 ENCOUNTER — Telehealth (HOSPITAL_COMMUNITY): Payer: Self-pay

## 2018-05-18 ENCOUNTER — Ambulatory Visit (HOSPITAL_COMMUNITY): Payer: Self-pay

## 2018-05-20 ENCOUNTER — Encounter (HOSPITAL_COMMUNITY): Payer: PPO

## 2018-05-20 ENCOUNTER — Ambulatory Visit (HOSPITAL_COMMUNITY): Payer: Self-pay

## 2018-05-21 DIAGNOSIS — S60551A Superficial foreign body of right hand, initial encounter: Secondary | ICD-10-CM | POA: Diagnosis not present

## 2018-05-25 ENCOUNTER — Ambulatory Visit (HOSPITAL_COMMUNITY): Payer: Self-pay

## 2018-05-25 ENCOUNTER — Encounter (HOSPITAL_COMMUNITY): Payer: PPO

## 2018-05-27 ENCOUNTER — Ambulatory Visit (HOSPITAL_COMMUNITY): Payer: Self-pay

## 2018-05-27 ENCOUNTER — Encounter (HOSPITAL_COMMUNITY): Payer: PPO

## 2018-06-01 ENCOUNTER — Ambulatory Visit (HOSPITAL_COMMUNITY): Payer: Self-pay

## 2018-06-01 ENCOUNTER — Encounter (HOSPITAL_COMMUNITY): Payer: PPO

## 2018-06-03 ENCOUNTER — Ambulatory Visit (HOSPITAL_COMMUNITY): Payer: Self-pay

## 2018-06-03 ENCOUNTER — Encounter (HOSPITAL_COMMUNITY): Payer: PPO

## 2018-06-08 ENCOUNTER — Telehealth (HOSPITAL_COMMUNITY): Payer: Self-pay | Admitting: *Deleted

## 2018-06-08 ENCOUNTER — Ambulatory Visit (HOSPITAL_COMMUNITY): Payer: Self-pay

## 2018-06-08 ENCOUNTER — Encounter (HOSPITAL_COMMUNITY): Payer: PPO

## 2018-06-10 ENCOUNTER — Encounter (HOSPITAL_COMMUNITY): Payer: PPO

## 2018-06-10 ENCOUNTER — Ambulatory Visit (HOSPITAL_COMMUNITY): Payer: Self-pay

## 2018-06-15 ENCOUNTER — Encounter (HOSPITAL_COMMUNITY): Payer: PPO

## 2018-06-15 ENCOUNTER — Ambulatory Visit (HOSPITAL_COMMUNITY): Payer: Self-pay

## 2018-06-17 ENCOUNTER — Ambulatory Visit (HOSPITAL_COMMUNITY): Payer: Self-pay

## 2018-06-17 ENCOUNTER — Encounter (HOSPITAL_COMMUNITY): Payer: PPO

## 2018-06-22 ENCOUNTER — Encounter (HOSPITAL_COMMUNITY): Payer: PPO

## 2018-06-22 ENCOUNTER — Ambulatory Visit (HOSPITAL_COMMUNITY): Payer: Self-pay

## 2018-06-24 ENCOUNTER — Ambulatory Visit (HOSPITAL_COMMUNITY): Payer: Self-pay

## 2018-06-24 ENCOUNTER — Encounter (HOSPITAL_COMMUNITY): Payer: PPO

## 2018-06-24 ENCOUNTER — Ambulatory Visit: Payer: Self-pay | Admitting: Internal Medicine

## 2018-06-29 ENCOUNTER — Ambulatory Visit (HOSPITAL_COMMUNITY): Payer: Self-pay

## 2018-06-29 ENCOUNTER — Encounter (HOSPITAL_COMMUNITY): Payer: PPO

## 2018-06-29 DIAGNOSIS — M5416 Radiculopathy, lumbar region: Secondary | ICD-10-CM | POA: Diagnosis not present

## 2018-06-29 DIAGNOSIS — M5136 Other intervertebral disc degeneration, lumbar region: Secondary | ICD-10-CM | POA: Diagnosis not present

## 2018-07-01 ENCOUNTER — Encounter (HOSPITAL_COMMUNITY): Payer: PPO

## 2018-07-01 ENCOUNTER — Ambulatory Visit (HOSPITAL_COMMUNITY): Payer: Self-pay

## 2018-07-06 ENCOUNTER — Ambulatory Visit (HOSPITAL_COMMUNITY): Payer: Self-pay

## 2018-07-06 ENCOUNTER — Encounter (HOSPITAL_COMMUNITY): Payer: PPO

## 2018-07-06 DIAGNOSIS — N50812 Left testicular pain: Secondary | ICD-10-CM | POA: Diagnosis not present

## 2018-07-06 DIAGNOSIS — J84112 Idiopathic pulmonary fibrosis: Secondary | ICD-10-CM | POA: Diagnosis not present

## 2018-07-06 DIAGNOSIS — I10 Essential (primary) hypertension: Secondary | ICD-10-CM | POA: Diagnosis not present

## 2018-07-07 ENCOUNTER — Telehealth (HOSPITAL_COMMUNITY): Payer: Self-pay | Admitting: *Deleted

## 2018-07-08 ENCOUNTER — Encounter (HOSPITAL_COMMUNITY): Payer: PPO

## 2018-07-08 ENCOUNTER — Ambulatory Visit (HOSPITAL_COMMUNITY): Payer: Self-pay

## 2018-07-13 ENCOUNTER — Encounter (HOSPITAL_COMMUNITY): Payer: PPO

## 2018-07-13 ENCOUNTER — Ambulatory Visit (HOSPITAL_COMMUNITY): Payer: Self-pay

## 2018-07-15 ENCOUNTER — Ambulatory Visit (HOSPITAL_COMMUNITY): Payer: Self-pay

## 2018-07-15 ENCOUNTER — Encounter (HOSPITAL_COMMUNITY): Payer: PPO

## 2018-07-20 ENCOUNTER — Encounter (HOSPITAL_COMMUNITY): Payer: PPO

## 2018-07-20 ENCOUNTER — Ambulatory Visit: Payer: Self-pay | Admitting: Neurology

## 2018-07-20 ENCOUNTER — Ambulatory Visit (HOSPITAL_COMMUNITY): Payer: Self-pay

## 2018-07-22 ENCOUNTER — Ambulatory Visit (HOSPITAL_COMMUNITY): Payer: Self-pay

## 2018-07-22 ENCOUNTER — Encounter (HOSPITAL_COMMUNITY): Payer: PPO

## 2018-08-02 DIAGNOSIS — F341 Dysthymic disorder: Secondary | ICD-10-CM | POA: Diagnosis not present

## 2018-08-02 DIAGNOSIS — F3342 Major depressive disorder, recurrent, in full remission: Secondary | ICD-10-CM | POA: Diagnosis not present

## 2018-08-10 ENCOUNTER — Other Ambulatory Visit: Payer: Self-pay | Admitting: Internal Medicine

## 2018-08-10 NOTE — Telephone Encounter (Signed)
Pt is scheduled for virtual visit 7/8--pt requested refill on pantoprazole.

## 2018-08-14 DIAGNOSIS — R21 Rash and other nonspecific skin eruption: Secondary | ICD-10-CM | POA: Diagnosis not present

## 2018-08-18 ENCOUNTER — Other Ambulatory Visit: Payer: Self-pay | Admitting: Internal Medicine

## 2018-08-18 DIAGNOSIS — M5416 Radiculopathy, lumbar region: Secondary | ICD-10-CM | POA: Diagnosis not present

## 2018-08-18 DIAGNOSIS — M5136 Other intervertebral disc degeneration, lumbar region: Secondary | ICD-10-CM | POA: Diagnosis not present

## 2018-08-20 ENCOUNTER — Other Ambulatory Visit (HOSPITAL_COMMUNITY): Admission: RE | Admit: 2018-08-20 | Payer: PPO | Source: Ambulatory Visit

## 2018-08-20 NOTE — Progress Notes (Signed)
Eduardo Fuentes Covid 73 screen has been changed to 08/21/18.

## 2018-08-21 ENCOUNTER — Other Ambulatory Visit (HOSPITAL_COMMUNITY)
Admission: RE | Admit: 2018-08-21 | Discharge: 2018-08-21 | Disposition: A | Discharge status: Home / Self Care | Payer: PPO | Source: Ambulatory Visit | Attending: Internal Medicine | Admitting: Internal Medicine

## 2018-08-21 DIAGNOSIS — Z1159 Encounter for screening for other viral diseases: Secondary | ICD-10-CM | POA: Diagnosis not present

## 2018-08-21 LAB — SARS CORONAVIRUS 2 (TAT 6-24 HRS): SARS Coronavirus 2: NEGATIVE

## 2018-08-25 ENCOUNTER — Ambulatory Visit (INDEPENDENT_AMBULATORY_CARE_PROVIDER_SITE_OTHER): Payer: PPO | Admitting: Internal Medicine

## 2018-08-25 ENCOUNTER — Other Ambulatory Visit: Payer: Self-pay

## 2018-08-25 DIAGNOSIS — J84112 Idiopathic pulmonary fibrosis: Secondary | ICD-10-CM

## 2018-08-25 LAB — PULMONARY FUNCTION TEST
DL/VA % pred: 97 %
DL/VA: 4 ml/min/mmHg/L
DLCO unc % pred: 57 %
DLCO unc: 14.69 ml/min/mmHg
FEF 25-75 Pre: 1.69 L/sec
FEF2575-%Pred-Pre: 68 %
FEV1-%Pred-Pre: 61 %
FEV1-Pre: 1.95 L
FEV1FVC-%Pred-Pre: 97 %
FEV6-%Pred-Pre: 63 %
FEV6-Pre: 2.6 L
FEV6FVC-%Pred-Pre: 105 %
FVC-%Pred-Pre: 62 %
FVC-Pre: 2.69 L
Pre FEV1/FVC ratio: 73 %
Pre FEV6/FVC Ratio: 100 %

## 2018-08-25 NOTE — Progress Notes (Signed)
Spirometry and Dlco done today. 

## 2018-08-30 ENCOUNTER — Other Ambulatory Visit: Payer: Self-pay

## 2018-08-30 ENCOUNTER — Ambulatory Visit: Payer: PPO | Admitting: Pulmonary Disease

## 2018-08-30 ENCOUNTER — Encounter: Payer: Self-pay | Admitting: Pulmonary Disease

## 2018-08-30 VITALS — BP 124/82 | HR 92 | Temp 98.7°F | Ht 69.0 in | Wt 188.6 lb

## 2018-08-30 DIAGNOSIS — J849 Interstitial pulmonary disease, unspecified: Secondary | ICD-10-CM

## 2018-08-30 DIAGNOSIS — J84112 Idiopathic pulmonary fibrosis: Secondary | ICD-10-CM | POA: Diagnosis not present

## 2018-08-30 MED ORDER — BENZONATATE 200 MG PO CAPS: 200.0000 mg | ORAL_CAPSULE | Freq: Three times a day (TID) | ORAL | 1 refills | Status: DC | PRN

## 2018-08-30 NOTE — Assessment & Plan Note (Signed)
Assessment: December/2019 pulmonary function test shows restriction November/2019 CT chest high-res shows UIP, mild honeycombing with progression in comparison to 2014 chest radiograph July/2020 pulmonary function test shows slightly worsened restriction Patient reporting clinical improvement over the last 6 to 7 months since starting anti-fibrotic's Patient tolerated walk well today in office Bibasilar crackles on exam today, right greater than left  Plan: Spirometry with DLCO in 3 months Repeat high-resolution CT chest in October/2020 Continue to increase physical activity Continue pulmonary rehab after COVID-19 restrictions are lifted Continue OFEV Follow-up in 3 months after completing CT chest as well as spirometry with DLCO

## 2018-08-30 NOTE — Patient Instructions (Addendum)
Walk in office today   Arlyce Harman with DLCO at next follow up visit   High Res CT Chest in October/ 2020  Continue OFEV   Continue to physically exert yourself daily   Continue to use incentive spirometer   Return in about 3 months (around 11/30/2018), or if symptoms worsen or fail to improve, for After Chest CT, Follow up for PFT, Follow up with Dr. Purnell Shoemaker.   Coronavirus (COVID-19) Are you at risk?  Are you at risk for the Coronavirus (COVID-19)?  To be considered HIGH RISK for Coronavirus (COVID-19), you have to meet the following criteria:  . Traveled to [place], [place], [place], [place] or [place]; or in the [place] to [place], [place], [place], or [place]; and have fever, cough, and shortness of breath within the last 2 weeks of travel OR . Been in close contact with a person diagnosed with COVID-19 within the last 2 weeks and have fever, cough, and shortness of breath . IF YOU DO NOT MEET THESE CRITERIA, YOU ARE CONSIDERED LOW RISK FOR COVID-19.  What to do if you are HIGH RISK for COVID-19?  Marland Kitchen If you are having a medical emergency, call 911. . Seek medical care right away. Before you go to a doctor's office, urgent care or emergency department, call ahead and tell them about your recent travel, contact with someone diagnosed with COVID-19, and your symptoms. You should receive instructions from your physician's office regarding next steps of care.  . When you arrive at healthcare provider, tell the healthcare staff immediately you have returned from visiting [place], [place], [place], [place] or [place]; or traveled in the [place] to [place], [place], [place], or [place]; in the last two weeks or you have been in close contact with a person diagnosed with COVID-19 in the last 2 weeks.   . Tell the health care staff about your symptoms: fever, cough and shortness of breath. . After you have been seen by a medical provider, you will be either: o Tested for  (COVID-19) and discharged home on quarantine except to seek medical care if symptoms worsen, and asked to  - Stay home and avoid contact with others until you get your results (4-5 days)  - Avoid travel on public transportation if possible (such as bus, train, or airplane) or o Sent to the Emergency Department by EMS for evaluation, COVID-19 testing, and possible admission depending on your condition and test results.  What to do if you are LOW RISK for COVID-19?  Reduce your risk of any infection by using the same precautions used for avoiding the common cold or flu:  Marland Kitchen Wash your hands often with soap and warm water for at least 20 seconds.  If soap and water are not readily available, use an alcohol-based hand sanitizer with at least 60% alcohol.  . If coughing or sneezing, cover your mouth and nose by coughing or sneezing into the elbow areas of your shirt or coat, into a tissue or into your sleeve (not your hands). . Avoid shaking hands with others and consider head nods or verbal greetings only. . Avoid touching your eyes, nose, or mouth with unwashed hands.  . Avoid close contact with people who are sick. . Avoid places or events with large numbers of people in one location, like concerts or sporting events. . Carefully consider travel plans you have or are making. . If you are planning any travel outside or inside the [place], visit  the CDC's Travelers' Health webpage for the latest health notices. . If you have some symptoms but not all symptoms, continue to monitor at home and seek medical attention if your symptoms worsen. . If you are having a medical emergency, call 911.   [place] / e-Visit: [email or web address]         [facility]: [facility]: [phone]                   [facility]: [phone]           It is flu  season:   >>> Best ways to protect herself from the flu: Receive the yearly flu vaccine, practice good hand hygiene washing with soap and also using hand sanitizer when available, eat a nutritious meals, get adequate rest, hydrate appropriately   Please contact the office if your symptoms worsen or you have concerns that you are not improving.   Thank you for choosing [facility] for your healthcare, and for allowing [place] to partner with you on your healthcare journey. I am thankful to be able to provide care to you today.   Wyn Quaker FNP-C

## 2018-08-30 NOTE — Progress Notes (Signed)
@Patient  ID: Eduardo Fuentes, male    DOB: 11-14-1950, 68 y.o.   MRN: [id number]  Chief Complaint  Patient presents with  . Follow-up    pft    Referring provider: Lujean Amel, MD  HPI:  68 year old male patient followed in our office for interstitial lung disease and IPF [clinical diagnosis based on classic UIP scan negative serology and age and male gender; diagnosis given February 11, 2017 and on Ofev since 02/27/2018  PMH: GERD Smoker/ Smoking History: Never Smoker  Maintenance: OFEV Pt of: Dr. Chase Caller  08/30/2018  - Visit   68 year old male never smoker followed in our office for idiopathic pulmonary fibrosis.  Patient has been maintained on Ofev since January/2020.  Patient is recently completed a spirometry with DLCO.  Results are listed below:  08/25/2018-pulmonary function test-FVC 2.69 (62% predicted), ratio 73, FEV1 1.95 (61% predicted), DLCO 57  Patient has been doing well on Ofev.  Patient reports that the diarrhea that he has is occasional and managed by over-the-counter antidiarrheals.  Patient is able to do more physical activity around his house since starting 05.  Patient reports he is able to do all the vacuuming in his house, run errands at stores, get groceries, as well as push mow all his lawn.  This is an improvement from how the patient was doing clinically in late 2019.  Patient still gets occasionally short of breath when walking up steps.   Tests:   02/11/2018-pulmonary function test- FVC 2.90 (67% predicted), ratio 70, postbronchodilator FEV1 1.77 (55% predicted), no bronchodilator response  08/25/2018-pulmonary function test-FVC 2.69 (62% predicted), ratio 73, FEV1 1.95 (61% predicted), DLCO 57  01/20/2018-CT chest high-res-spectrum of findings compatible with basilar predominant fibrotic interstitial lung disease with mild honeycombing these findings are new compared to November 2014 chest radiograph.  Findings are consistent with UIP.   01/29/2018-  hypersensitivity pneumonitis, CK, aldolase, CCP, Rh, ANA, ANA titer-trace positive ANA but otherwise autoimmune labs normal  01/29/2018- integrative comprehensive ILD questionnaire [place] Integrated Comprehensive ILD Questionnaire  Symptoms: He reports shortness of breath onset gradually for the last 1 year.  It is getting worse with time.  He does have episodic dyspnea.  When he does shopping it is level 1.  When he walks on a level ground with others of his age at this level 1 walking up stairs at this level 1 but walking at his own pace at this level 2 and taking a shower at level 3.  He does have associated arthralgia and cough.  He is not sure when the cough started.  It is present at all positions since it started it is the same as moderate in intensity.  He does cough at night.  He does bring up phlegm which is clear in color.  It gets worse when he lies down and it affects his voice and he does clear his throat.  He does not feel a tickle.  There is no wheezing.  Past Medical History : This is positive for major depression but he circled no for asthma, COPD.  Surgical no for heart failure.  Surgical no for collagen vascular disease with sleep apnea.  He did not answer for acid reflux which he does have..  Denies diabetes or thyroid disease or heart disease or pulmonary embolism or pneumonia  ROS:  .  Positive for difficulty swallowing due to his esophageal stricture history.  Does have nausea and acid reflux and snoring.  Otherwise no vomiting is positive for soreness across  the body after electroconvulsive therapy 2 days ago.   FAMILY HISTORY of LUNG DISEASE: Denies family history of any pulmonary fibrosis COPD or asthma sarcoidosis  EXPOSURE HISTORY: Denies smoking cigarettes or tobacco or marijuana or cocaine.  No intravenous drug abuse.  HOME and HOBBY DETAILS : He did not list the age of the home but is lived in this home [place].  There is no mold in the  house of mildew.  The house is not damp.  Does not use a CPAP mask.  No nebulizer machine.  No use of Jacuzzi.  Nose fountain in the house.  No pet birds or gerbils.  Does not use feather pillows.  He is not sure if there is mold in the [facility] duct system.  Does not play wind instruments.  He occasionally mows the lawn  OCCUPATIONAL HISTORY (122 questions) : Positive for social gardening.  Did report that he is used feather pillows and blankets in the past but otherwise negative including working in a dusty environment  PULMONARY TOXICITY HISTORY (27 items): Positive for cancer chemotherapy but he does not know the details  SIX MIN WALK 08/30/2018 04/29/2018 03/16/2018 02/09/2018 01/29/2018  Supplimental Oxygen during Test? (L/min) No No No - No  Tech Comments: patient started with O2 sat of 97 on room air, and heart rate of 102 tolerated walk well, ending o2 sat at 96 with a heart rate of 100. Pt walked at a normal pace completing all required laps having complaints of mild SOB. Pt walked at a steady pace completing all required laps having complaints of mild SOB. Pt walked at moderate pace tolerated well (3 laps). Denies lightheadedness, and dizziness, but slight SOB. Pt stated that he could have walked more laps.  steady walk, moderate pace  TA/CMA     FENO:  No results found for: NITRICOXIDE  PFT: PFT Results Latest Ref Rng & Units 08/25/2018 02/11/2018  FVC-Pre L 2.69 2.90  FVC-Predicted Pre % 62 67  FVC-Post L - 2.52  FVC-Predicted Post % - 58  Pre FEV1/FVC % % 73 86  Post FEV1/FCV % % - 70  FEV1-Pre L 1.95 2.50  FEV1-Predicted Pre % 61 78  FEV1-Post L - 1.77  DLCO UNC% % 57 -  DLCO COR %Predicted % 97 -  TLC L - 3.41  TLC % Predicted % - 50  RV % Predicted % - 11    Imaging: No results found.    Specialty Problems      Pulmonary Problems   IPF (idiopathic pulmonary fibrosis) ([place])    01/20/2018-CT chest high-res-spectrum of findings compatible with basilar predominant fibrotic  interstitial lung disease with mild honeycombing these findings are new compared to November 2014 chest radiograph.  Findings are consistent with UIP.   01/29/2018- hypersensitivity pneumonitis, CK, aldolase, CCP, Rh, ANA, ANA titer-trace positive ANA but otherwise autoimmune labs normal  01/29/2018- integrative comprehensive ILD questionnaire [place] Integrated Comprehensive ILD Questionnaire  Symptoms: He reports shortness of breath onset gradually for the last 1 year.  It is getting worse with time.  He does have episodic dyspnea.  When he does shopping it is level 1.  When he walks on a level ground with others of his age at this level 1 walking up stairs at this level 1 but walking at his own pace at this level 2 and taking a shower at level 3.  He does have associated arthralgia and cough.  He is not sure  when the cough started.  It is present at all positions since it started it is the same as moderate in intensity.  He does cough at night.  He does bring up phlegm which is clear in color.  It gets worse when he lies down and it affects his voice and he does clear his throat.  He does not feel a tickle.  There is no wheezing.   Past Medical History : This is positive for major depression but he circled no for asthma, COPD.  Surgical no for heart failure.  Surgical no for collagen vascular disease with sleep apnea.  He did not answer for acid reflux which he does have..  Denies diabetes or thyroid disease or heart disease or pulmonary embolism or pneumonia   ROS:  .  Positive for difficulty swallowing due to his esophageal stricture history.  Does have nausea and acid reflux and snoring.  Otherwise no vomiting is positive for soreness across the body after electroconvulsive therapy 2 days ago.   FAMILY HISTORY of LUNG DISEASE: Denies family history of any pulmonary fibrosis COPD or asthma sarcoidosis   EXPOSURE HISTORY: Denies smoking cigarettes or tobacco or marijuana or cocaine.  No  intravenous drug abuse.   HOME and HOBBY DETAILS : He did not list the age of the home but is lived in this home for 25 years [place] an [place] setting.  There is no mold in the house of mildew.  The house is not damp.  Does not use a CPAP mask.  No nebulizer machine.  No use of Jacuzzi.  Nose fountain in the house.  No pet birds or gerbils.  Does not use feather pillows.  He is not sure if there is mold in the [facility] duct system.  Does not play wind instruments.  He occasionally mows the lawn   OCCUPATIONAL HISTORY (122 questions) : Positive for social gardening.  Did report that he is used feather pillows and blankets in the past but otherwise negative including working in a dusty environment   PULMONARY TOXICITY HISTORY (27 items): Positive for cancer chemotherapy but he does not know the details  02/11/2018- pulmonary function test.  FVC 2.9 (67% predicted), postbronchodilator ratio 70, FEV1 55, patient had difficulty completing pulmonary function test due to repeated coughing  08/25/2018-pulmonary function test-FVC 2.69 (62% predicted), ratio 73, FEV1 1.95 (61% predicted), DLCO 57          Allergies  Allergen Reactions  . Aleve [Naproxen Sodium] Anaphylaxis and Other (See Comments)    GI UPSET Patient does not remember anaphylaxis  . Hyzaar [Losartan Potassium-Hctz] Other (See Comments)    Felt bad   . Lisinopril Cough  . Demerol [Meperidine] Rash         Immunization History  Administered Date(s) Administered  . Influenza Inj Mdck Quad Pf 11/09/2017  . Pneumococcal Conjugate-13 10/26/2015  . Pneumococcal Polysaccharide-23 10/29/2016    Past Medical History:  Diagnosis Date  . Arthritis    NECK  . Cough    NON-PRODUCTIVE  . Depressive disorder    follow by dr [facility]  . Diverticulosis   . Diverticulosis of colon   . Generalized anxiety disorder   . GERD (gastroesophageal reflux disease)    WATCHES DIET  . Hemorrhoid   . High blood pressure   . History of  colon polyps   . History of kidney stones   . Microscopic hematuria   . Nocturia   . Panic attacks   . Prostate  cancer ([place])   . Sinus infection   . Tinnitus    LEFT EAR -- CHRONIC    Tobacco History: Social History   Tobacco Use  Smoking Status Never Smoker  Smokeless Tobacco Never Used   Counseling given: Not Answered   Continue to not smoke  Outpatient Encounter Medications as of 08/30/2018  Medication Sig  . clonazePAM (KLONOPIN) 1 MG tablet Take 1 tablet at 8 am, 1/2 tablet at 2 pm and 1 tablet at bedtime for anxiety. (Patient taking differently: Take 1 mg by mouth 3 (three) times daily. Pt states "I usually take 1 tablet three times a day")  . dimenhyDRINATE (DRAMAMINE PO) Take by mouth.  . gabapentin (NEURONTIN) 300 MG capsule Take 300 mg by mouth once.  . hydrochlorothiazide (MICROZIDE) 12.5 MG capsule Take 12.5 mg by mouth daily.  . mirtazapine (REMERON) 30 MG tablet TK 1 T PO QHS  . nortriptyline (PAMELOR) 50 MG capsule Take 25 mg by mouth daily at 8 pm.   . OFEV 150 MG CAPS Take 150 mg by mouth 2 (two) times daily.   . ondansetron (ZOFRAN) 4 MG tablet Take 1 tablet (4 mg total) by mouth every 8 (eight) hours as needed for nausea or vomiting.  . pantoprazole (PROTONIX) 40 MG tablet TAKE 1 TABLET BY MOUTH DAILY  . benzonatate (TESSALON) 200 MG capsule Take 1 capsule (200 mg total) by mouth 3 (three) times daily as needed for cough.  . NEOMYCIN-POLYMYXIN-HYDROCORTISONE (CORTISPORIN) 1 % SOLN OTIC solution Apply to bed after soaking (Patient not taking: Reported on 08/30/2018)   No facility-administered encounter medications on file as of 08/30/2018.      Review of Systems  Review of Systems  Constitutional: Negative for activity change, chills, fatigue, fever and unexpected weight change.  HENT: Negative for postnasal drip, rhinorrhea, sinus pressure, sinus pain, sneezing and sore throat.   Eyes: Negative.   Respiratory: Positive for shortness of breath. Negative for  wheezing.   Cardiovascular: Negative for chest pain and palpitations.  Gastrointestinal: Negative for constipation, diarrhea, nausea and vomiting.  Endocrine: Negative.   Musculoskeletal: Negative.   Skin: Negative.   Neurological: Negative for dizziness and headaches.  Psychiatric/Behavioral: Negative.  Negative for dysphoric mood. The patient is not nervous/anxious.   All other systems reviewed and are negative.    Physical Exam  BP 124/82 (BP Location: Left Arm, Cuff Size: Normal)   Pulse 92   Temp 98.7 F (37.1 C) (Oral)   Ht 5\' 9"  (1.753 m)   Wt 188 lb 9.6 oz (85.5 kg)   SpO2 96%   BMI 27.85 kg/m   Wt Readings from Last 5 Encounters:  08/30/18 188 lb 9.6 oz (85.5 kg)  05/13/18 191 lb 2.2 oz (86.7 kg)  04/29/18 193 lb 12.8 oz (87.9 kg)  04/27/18 193 lb 9 oz (87.8 kg)  04/05/18 193 lb 5.5 oz (87.7 kg)     Physical Exam  Constitutional: He is oriented to person, place, and time and well-developed, well-nourished, and in no distress. No distress.  HENT:  Head: Normocephalic and atraumatic.  Right Ear: Hearing and external ear normal.  Left Ear: Hearing and external ear normal.  Nose: Nose normal.  Mouth/Throat: Uvula is midline and oropharynx is clear and moist. No oropharyngeal exudate.  Eyes: Pupils are equal, round, and reactive to light.  Neck: Normal range of motion. Neck supple.  Cardiovascular: Normal rate, regular rhythm and normal heart sounds.  Pulmonary/Chest: Effort normal. No accessory muscle usage. No respiratory distress.  He has no decreased breath sounds. He has no wheezes. He has no rhonchi. He has rales (bibasilar - R> L ) in the right upper field, the right middle field, the right lower field, the left middle field and the left lower field.  Abdominal: Soft. Bowel sounds are normal. There is no abdominal tenderness.  Musculoskeletal: Normal range of motion.  Lymphadenopathy:    He has no cervical adenopathy.  Neurological: He is alert and oriented  to person, place, and time. Gait normal.  Skin: Skin is warm and dry. He is not diaphoretic. No erythema.  Psychiatric: Memory, affect and judgment normal. His mood appears anxious.  Nursing note and vitals reviewed.     Lab Results:  CBC    Component Value Date/Time   WBC 8.0 01/15/2018 1323   RBC 5.00 01/15/2018 1323   HGB 15.2 01/15/2018 1323   HCT 46.1 01/15/2018 1323   PLT 306 01/15/2018 1323   MCV 92.2 01/15/2018 1323   MCH 30.4 01/15/2018 1323   MCHC 33.0 01/15/2018 1323   RDW 14.0 01/15/2018 1323   LYMPHSABS 0.9 01/26/2013 1102   MONOABS 0.2 01/26/2013 1102   EOSABS 0.1 01/26/2013 1102   BASOSABS 0.0 01/26/2013 1102    BMET    Component Value Date/Time   NA 137 01/15/2018 1323   K 4.5 01/15/2018 1323   CL 102 01/15/2018 1323   CO2 28 01/15/2018 1323   GLUCOSE 90 01/15/2018 1323   BUN 16 01/15/2018 1323   CREATININE 1.18 01/15/2018 1323   CALCIUM 9.0 01/15/2018 1323   GFRNONAA >60 01/15/2018 1323   GFRAA >60 01/15/2018 1323    BNP No results found for: BNP  ProBNP No results found for: PROBNP    Assessment & Plan:   IPF (idiopathic pulmonary fibrosis) ([facility]) Assessment: December/2019 pulmonary function test shows restriction November/2019 CT chest high-res shows UIP, mild honeycombing with progression in comparison to 2014 chest radiograph July/2020 pulmonary function test shows slightly worsened restriction Patient reporting clinical improvement over the last 6 to 7 months since starting anti-fibrotic's Patient tolerated walk well today in office Bibasilar crackles on exam today, right greater than left  Plan: Spirometry with DLCO in 3 months Repeat high-resolution CT chest in October/2020 Continue to increase physical activity Continue pulmonary rehab after COVID-19 restrictions are lifted Continue OFEV Follow-up in 3 months after completing CT chest as well as spirometry with DLCO    Return in about 3 months (around 11/30/2018), or if  symptoms worsen or fail to improve, for After Chest CT, Follow up for PFT, Follow up with Dr. Purnell Shoemaker.   Lauraine Rinne, NP 08/30/2018   This appointment was 26 minutes long with over 50% of the time in direct face-to-face patient care, assessment, plan of care, and follow-up.

## 2018-08-31 ENCOUNTER — Telehealth: Payer: Self-pay

## 2018-08-31 DIAGNOSIS — M5416 Radiculopathy, lumbar region: Secondary | ICD-10-CM | POA: Diagnosis not present

## 2018-08-31 DIAGNOSIS — C61 Malignant neoplasm of prostate: Secondary | ICD-10-CM | POA: Diagnosis not present

## 2018-08-31 NOTE — Telephone Encounter (Signed)
Phone screening complete 

## 2018-09-01 ENCOUNTER — Ambulatory Visit (INDEPENDENT_AMBULATORY_CARE_PROVIDER_SITE_OTHER): Payer: PPO | Admitting: Internal Medicine

## 2018-09-01 ENCOUNTER — Encounter: Payer: Self-pay | Admitting: Internal Medicine

## 2018-09-01 ENCOUNTER — Other Ambulatory Visit: Payer: Self-pay

## 2018-09-01 VITALS — Ht 69.0 in | Wt 185.0 lb

## 2018-09-01 DIAGNOSIS — R1310 Dysphagia, unspecified: Secondary | ICD-10-CM

## 2018-09-01 DIAGNOSIS — K219 Gastro-esophageal reflux disease without esophagitis: Secondary | ICD-10-CM | POA: Diagnosis not present

## 2018-09-01 DIAGNOSIS — K222 Esophageal obstruction: Secondary | ICD-10-CM

## 2018-09-01 MED ORDER — PANTOPRAZOLE SODIUM 40 MG PO TBEC: 40.0000 mg | DELAYED_RELEASE_TABLET | Freq: Every day | ORAL | 3 refills | Status: DC

## 2018-09-01 NOTE — Addendum Note (Signed)
Addended by: Audrea Muscat on: 09/01/2018 03:07 PM   Modules accepted: Orders

## 2018-09-01 NOTE — Patient Instructions (Addendum)
1.  Reflux precautions  2.  We have sent the following medications to your pharmacy for you to pick up at your convenience:  Pantoprazole  3.  Routine office follow-up 1 year.  Dr. Blanch Media schedule does not go out that far so call the office one or two months ahead of time to schedule the appointment.  Contact the office in the interim for any questions or problems  4.  Surveillance colonoscopy around 2025

## 2018-09-01 NOTE — Progress Notes (Signed)
HISTORY OF PRESENT ILLNESS:  Eduardo Fuentes is a 68 y.o. male with GERD and history of adenomatous colon polyps who presents today via telehealth medicine during the coronavirus pandemic for routine follow-up and management of his chronic GERD complicated by peptic stricture.  Patient was last seen in this office May 21, 2017.  On January 07, 2018 he underwent upper endoscopy due to a chief complaint of dysphasia.  He was found to have a benign esophageal stricture which was dilated to 53 [place].  He was continued on his PPI which is pantoprazole 40 mg daily.  He has continued on the medication without interruption.  He is tolerating this well.  He denies breakthrough heartburn or other classic reflux symptoms.  No recurrent dysphasia.  His only lower GI complaint is occasional diarrhea which he attributes to [facility] which was started about 6 or 8 months ago for mild pulmonary fibrosis.  His GI review of systems is otherwise negative.  Review of outside blood work from March 2020 shows normal liver tests except for alkaline phosphatase slightly elevated at 120.  CBC from November 2019 with hemoglobin 15.2.  His last colonoscopy was January 2015.  No polyps.  Follow-up in 10 years recommended.  He does have moderate diverticulosis  REVIEW OF SYSTEMS:  All non-GI ROS negative unless otherwise stated in the HPI except for arthritis  Past Medical History:  Diagnosis Date  . Arthritis    NECK  . Cough    NON-PRODUCTIVE  . Depressive disorder    follow by dr [facility] psychiat[facility]  . Diverticulosis   . Diverticulosis of colon   . Generalized anxiety disorder   . GERD (gastroesophageal reflux disease)    WATCHES DIET  . Hemorrhoid   . High blood pressure   . History of colon polyps   . History of kidney stones   . Microscopic hematuria   . Nocturia   . Panic attacks   . Prostate cancer ([place])   . Sinus infection   . Tinnitus    LEFT EAR -- CHRONIC    Past Surgical History:  Procedure Laterality  Date  . COLONOSCOPY    . ESOPHAGOGASTRODUODENOSCOPY    . EXTRACORPOREAL SHOCK WAVE LITHOTRIPSY  YRS AGO  . PROSTATE BIOPSY    . RADIOACTIVE SEED IMPLANT N/A 02/02/2013   Procedure: RADIOACTIVE SEED IMPLANT;  Surgeon: Molli Hazard, MD;  Location: [facility];  Service: Urology;  Laterality: N/A;  . TONSILLECTOMY AND ADENOIDECTOMY  AS CHILD    Social History DEWEY VIENS  reports that he has never smoked. He has never used smokeless tobacco. He reports that he does not drink alcohol or use drugs.  family history includes Alzheimer's disease in his mother; Anxiety disorder in his mother and sister; Bone cancer in his father; Cancer in his father; Dementia in his mother; Heart disease in his father.  Allergies  Allergen Reactions  . Aleve [Naproxen Sodium] Anaphylaxis and Other (See Comments)    GI UPSET Patient does not remember anaphylaxis  . Hyzaar [Losartan Potassium-Hctz] Other (See Comments)    Felt bad   . Lisinopril Cough  . Demerol [Meperidine] Rash            PHYSICAL EXAMINATION: Patient is alert and oriented.  Sounds well No additional physical exam information with telehealth visit  ASSESSMENT:  1.  GERD complicated by peptic stricture.  Currently asymptomatic post dilation on PPI 2.  History of adenomatous colon polyps.  Surveillance up-to-date 3.  Overweight 4.  General medical problems.  Stable 5.  Mild loose bowels intermittently.  Possible drug reaction.  Nonprogressive  PLAN:  1.  Reflux precautions 2.  REFILL PANTOPRAZOLE 40 mg daily; #30; 11 refills.  Medication risks reviewed 3.  Routine office follow-up 1 year.  Contact the office in the interim for any questions or problems 4.  Surveillance colonoscopy around 2025  This audio only telehealth visit was initiated by and consented for by the patient who was in his home while I was in my office during the encounter.  He understands it may be an associated professional charge  for this service which totaled 15 minutes in duration

## 2018-09-06 ENCOUNTER — Ambulatory Visit: Payer: PPO | Admitting: Pulmonary Disease

## 2018-09-06 DIAGNOSIS — C61 Malignant neoplasm of prostate: Secondary | ICD-10-CM | POA: Diagnosis not present

## 2018-09-06 DIAGNOSIS — Z87442 Personal history of urinary calculi: Secondary | ICD-10-CM | POA: Diagnosis not present

## 2018-09-06 DIAGNOSIS — N5082 Scrotal pain: Secondary | ICD-10-CM | POA: Diagnosis not present

## 2018-09-06 DIAGNOSIS — R3915 Urgency of urination: Secondary | ICD-10-CM | POA: Diagnosis not present

## 2018-09-06 DIAGNOSIS — N4340 Spermatocele of epididymis, unspecified: Secondary | ICD-10-CM | POA: Diagnosis not present

## 2018-09-10 ENCOUNTER — Telehealth (HOSPITAL_COMMUNITY): Payer: Self-pay

## 2018-09-21 ENCOUNTER — Other Ambulatory Visit: Payer: Self-pay

## 2018-09-21 ENCOUNTER — Encounter (HOSPITAL_COMMUNITY)
Admission: RE | Admit: 2018-09-21 | Discharge: 2018-09-21 | Disposition: A | Discharge status: Home / Self Care | Payer: PPO | Source: Ambulatory Visit | Attending: Internal Medicine | Admitting: Internal Medicine

## 2018-09-21 DIAGNOSIS — J849 Interstitial pulmonary disease, unspecified: Secondary | ICD-10-CM

## 2018-09-21 NOTE — Progress Notes (Signed)
Daily Session Note  Patient Details  Name: Eduardo Fuentes MRN: [id number] Date of Birth: [birth year] Referring Provider:     Pulmonary Rehab Walk Test from 04/06/2018 in [place]  Referring Provider  Dr. Chase Caller      Encounter Date: 09/21/2018  Check In: Session Check In - 09/21/18 1148      Check-In   Supervising physician immediately available to respond to emergencies  [facility] immediately available    Physician(s)  Dr. Nevada Crane    Location  [facility]    Staff Present  Rosebud Poles, RN, Bjorn Loser, MS, Exercise Physiologist;Lisa Ysidro Evert, RN    Virtual Visit  No    Medication changes reported      No    Fall or balance concerns reported     No    Tobacco Cessation  No Change    Warm-up and Cool-down  Performed as group-led instruction    Resistance Training Performed  Yes    VAD Patient?  No    PAD/SET Patient?  No      Pain Assessment   Currently in Pain?  No/denies    Multiple Pain Sites  No       Capillary Blood Glucose: No results found for this or any previous visit (from the past 24 hour(s)).  Exercise Prescription Changes - 09/21/18 1200      Response to Exercise   Blood Pressure (Admit)  100/66    Blood Pressure (Exercise)  120/70    Blood Pressure (Exit)  112/74    Heart Rate (Admit)  97 bpm    Heart Rate (Exercise)  90 bpm    Heart Rate (Exit)  95 bpm    Oxygen Saturation (Admit)  97 %    Oxygen Saturation (Exercise)  126 %    Oxygen Saturation (Exit)  102 %    Rating of Perceived Exertion (Exercise)  13    Perceived Dyspnea (Exercise)  2    Duration  Progress to 45 minutes of aerobic exercise without signs/symptoms of physical distress    Intensity  THRR unchanged      Progression   Progression  Continue to progress workloads to maintain intensity without signs/symptoms of physical distress.      Resistance Training   Training Prescription  Yes    Weight  orange bands    Reps   10-15    Time  10 Minutes      Treadmill   MPH  1.8    Grade  2    Minutes  15      NuStep   Level  2    SPM  80    Minutes  15    METs  1.9       Social History   Tobacco Use  Smoking Status Never Smoker  Smokeless Tobacco Never Used    Goals Met:  Proper associated with RPD/PD & O2 Sat Exercise tolerated well Strength training completed today  Goals Unmet:  Not Applicable  Comments: Service time is [id number] to 1030    Dr. Rush Farmer is Medical Director for Pulmonary Rehab at [facility].

## 2018-09-23 ENCOUNTER — Encounter (HOSPITAL_COMMUNITY)
Admission: RE | Admit: 2018-09-23 | Discharge: 2018-09-23 | Disposition: A | Discharge status: Home / Self Care | Payer: PPO | Source: Ambulatory Visit | Attending: Internal Medicine | Admitting: Internal Medicine

## 2018-09-23 ENCOUNTER — Other Ambulatory Visit: Payer: Self-pay

## 2018-09-23 DIAGNOSIS — J849 Interstitial pulmonary disease, unspecified: Secondary | ICD-10-CM | POA: Diagnosis not present

## 2018-09-23 NOTE — Progress Notes (Signed)
Daily Session Note  Patient Details  Name: Eduardo Fuentes MRN: [id number] Date of Birth: 10-08-1950 Referring Provider:     Pulmonary Rehab Walk Test from 04/06/2018 in [place]  Referring Provider  Dr. Chase Caller      Encounter Date: 09/23/2018  Check In: Session Check In - 09/23/18 [place]      Check-In   Supervising physician immediately available to respond to emergencies  [facility] immediately available    Physician(s)  Dr. [place] Crane    Location  [facility]    Staff Present  Rosebud Poles, RN, Bjorn Loser, MS, Exercise Physiologist;Kem Hensen Ysidro Evert, RN    Warm-up and Cool-down  Performed as group-led instruction    Resistance Training Performed  Yes    VAD Patient?  No    PAD/SET Patient?  No      Pain Assessment   Currently in Pain?  No/denies    Multiple Pain Sites  No       Capillary Blood Glucose: No results found for this or any previous visit (from the past 24 hour(s)).    Social History   Tobacco Use  Smoking Status Never Smoker  Smokeless Tobacco Never Used    Goals Met:  Exercise tolerated well No report of cardiac concerns or symptoms Strength training completed today  Goals Unmet:  Not Applicable  Comments: Service time is from 0945 to 64      Dr. Rush Farmer is Medical Director for Pulmonary Rehab at [facility].

## 2018-09-24 ENCOUNTER — Telehealth: Payer: Self-pay | Admitting: Pulmonary Disease

## 2018-09-24 MED ORDER — ONDANSETRON HCL 4 MG PO TABS: 4.0000 mg | ORAL_TABLET | Freq: Three times a day (TID) | ORAL | 0 refills | Status: DC | PRN

## 2018-09-24 NOTE — Telephone Encounter (Signed)
Okay to send in prescription for Zofran 4 mg tablets which he can take every 8 hours as needed for nausea and vomiting.  Quantity of tablets 30 tablets, 0 refills.Instruct the patient to contact us in a week and let us know he is doing.  If symptoms are persisting we may need to consider a tele-visit to further evaluate.Wyn Quaker, FNP

## 2018-09-24 NOTE — Telephone Encounter (Signed)
Per pt's med history, pt was last prescribed zofran 4mg  with instructions to take 1 tablet by mouth every 8 hours as needed for nausea/vomiting and a quantity of 20tabs was sent in for pt.   Called and spoke with pt stating to him that [name] said he doubts the nausea is coming from the [facility] due to him being on it for awhile now and pt verbalized understanding.  Asked pt if he remembers if the zofran which he had been prescribed in the past helped with nausea and pt said that it has been so long since he has been on it that he cannot remember but he did say he would be up to having an Rx sent to pharmacy for it.  Asked pt if he has eaten any new/unusual meals recently and pt said that he has not. Pt has not been prescribed nor has he taken any new meds. Pt has had fatigue but he said that this is after he exerts himself whether it is when he mows the lawn or exercises. Pt denies any complaints of abdominal pain.

## 2018-09-24 NOTE — Telephone Encounter (Signed)
Highly doubt that the nausea is randomly coming from [facility].  Patient has been maintained on Ofev for quite some time.  Can consider trial of Zofran to see if this helps with patient's nausea.  Does the patient member what dose he was on previously 4 mg or 8 mg?  Is patient having any other sort of acute symptoms?  Fatigue?  Abdominal pain? Has patient had any sort of new or unusual meals lately? Any new meds?  If patient symptoms persist despite Zofran the what we could consider is a drug holiday from [facility] for a week or so.   Wyn Quaker FNP

## 2018-09-24 NOTE — Telephone Encounter (Signed)
Called and spoke with patient. Let him know to call us back in a week to let us know how he is feeling. I sent Rx to preferred pharmacy patient aware.   Nothing further needed

## 2018-09-24 NOTE — Telephone Encounter (Signed)
Spoke with the pt  He is c/o nausea off and on throughout the day  Wonders if this could be coming from the [facility]  He takes 150 mg bid with a meal  He is having occ loose stools "not really diarrhea though"  He felt nauseous this morning before he even ate  No vomiting or abd pain He takes occ dramamine and has xofran on his med list but ran out of this med a long time ago  Want to know if [name] can give something to help  Please advise, thanks!

## 2018-09-28 ENCOUNTER — Encounter (HOSPITAL_COMMUNITY)
Admission: RE | Admit: 2018-09-28 | Discharge: 2018-09-28 | Disposition: A | Discharge status: Home / Self Care | Payer: PPO | Source: Ambulatory Visit | Attending: Internal Medicine | Admitting: Internal Medicine

## 2018-09-28 ENCOUNTER — Other Ambulatory Visit: Payer: Self-pay

## 2018-09-28 DIAGNOSIS — J849 Interstitial pulmonary disease, unspecified: Secondary | ICD-10-CM

## 2018-09-28 NOTE — Progress Notes (Signed)
Daily Session Note  Patient Details  Name: Eduardo Fuentes MRN: [id number] Date of Birth: 10/21/1950 Referring Provider:     Pulmonary Rehab Walk Test from 04/06/2018 in [facility]  Referring Provider  Dr. Ramaswamy      Encounter Date: 09/28/2018  Check In: Session Check In - 09/28/18 0945      Check-In   Physician(s)  Dr. [place]    Location  [facility]    Staff Present   , RN, BSN;Dalton Fletcher, MS, Exercise Physiologist;Lisa Hughes, RN    Virtual Visit  No    Medication changes reported      No    Fall or balance concerns reported     No    Tobacco Cessation  No Change    Warm-up and Cool-down  Performed as group-led instruction    Resistance Training Performed  Yes    VAD Patient?  No    PAD/SET Patient?  No      Pain Assessment   Currently in Pain?  No/denies       Capillary Blood Glucose: No results found for this or any previous visit (from the past 24 hour(s)).    Social History   Tobacco Use  Smoking Status Never Smoker  Smokeless Tobacco Never Used    Goals Met:  Proper associated with RPD/PD & O2 Sat Exercise tolerated well Strength training completed today  Goals Unmet:  Not Applicable  Comments: Service time is from 0945 to 1045    Dr. Wesam G. Yacoub is Medical Director for Pulmonary Rehab at [facility]. 

## 2018-09-30 ENCOUNTER — Other Ambulatory Visit: Payer: Self-pay

## 2018-09-30 ENCOUNTER — Encounter (HOSPITAL_COMMUNITY)
Admission: RE | Admit: 2018-09-30 | Discharge: 2018-09-30 | Disposition: A | Discharge status: Home / Self Care | Payer: PPO | Source: Ambulatory Visit | Attending: Internal Medicine | Admitting: Internal Medicine

## 2018-09-30 DIAGNOSIS — J849 Interstitial pulmonary disease, unspecified: Secondary | ICD-10-CM | POA: Diagnosis not present

## 2018-09-30 NOTE — Progress Notes (Signed)
Daily Session Note  Patient Details  Name: Eduardo Fuentes MRN: [id number] Date of Birth: [birth year] Referring Provider:     Pulmonary Rehab Walk Test from 04/06/2018 in [place]  Referring Provider  Dr. Chase Caller      Encounter Date: 09/30/2018  Check In: Session Check In - 09/30/18 0951      Check-In   Supervising physician immediately available to respond to emergencies  [facility] immediately available    Physician(s)  Dr. Benny Lennert    Location  [facility]    Staff Present  Rosebud Poles, RN, Bjorn Loser, MS, Exercise Physiologist;Eliodoro Gullett Ysidro Evert, RN    Virtual Visit  No    Medication changes reported      No    Fall or balance concerns reported     No    Tobacco Cessation  No Change    Warm-up and Cool-down  Performed as group-led instruction    Resistance Training Performed  Yes    VAD Patient?  No    PAD/SET Patient?  No      Pain Assessment   Currently in Pain?  No/denies       Capillary Blood Glucose: No results found for this or any previous visit (from the past 24 hour(s)).    Social History   Tobacco Use  Smoking Status Never Smoker  Smokeless Tobacco Never Used    Goals Met:  Exercise tolerated well No report of cardiac concerns or symptoms Strength training completed today  Goals Unmet:  Not Applicable  Comments: Service time is from 0950 to 1042    Dr. Rush Farmer is Medical Director for Pulmonary Rehab at [facility].

## 2018-10-05 ENCOUNTER — Other Ambulatory Visit: Payer: Self-pay

## 2018-10-05 ENCOUNTER — Encounter (HOSPITAL_COMMUNITY)
Admission: RE | Admit: 2018-10-05 | Discharge: 2018-10-05 | Disposition: A | Discharge status: Home / Self Care | Payer: PPO | Source: Ambulatory Visit | Attending: Internal Medicine | Admitting: Internal Medicine

## 2018-10-05 VITALS — Wt 190.0 lb

## 2018-10-05 DIAGNOSIS — J849 Interstitial pulmonary disease, unspecified: Secondary | ICD-10-CM

## 2018-10-05 NOTE — Progress Notes (Signed)
Daily Session Note  Patient Details  Name: Eduardo Fuentes MRN: [id number] Date of Birth: 07-22-1950 Referring Provider:     Pulmonary Rehab Walk Test from 04/06/2018 in [place]  Referring Provider  Dr. Chase Caller      Encounter Date: 10/05/2018  Check In: Session Check In - 10/05/18 0956      Check-In   Supervising physician immediately available to respond to emergencies  [facility] immediately available    Physician(s)  Dr. Benny Lennert    Location  [facility]    Staff Present  Rosebud Poles, RN, Bjorn Loser, MS, Exercise Physiologist;Laqueisha Catalina Ysidro Evert, RN    Warm-up and Cool-down  Performed as group-led instruction    Resistance Training Performed  Yes    VAD Patient?  No    PAD/SET Patient?  No      Pain Assessment   Currently in Pain?  No/denies    Multiple Pain Sites  No       Capillary Blood Glucose: No results found for this or any previous visit (from the past 24 hour(s)).  Exercise Prescription Changes - 10/05/18 1100      Response to Exercise   Blood Pressure (Admit)  112/60    Blood Pressure (Exercise)  130/80    Blood Pressure (Exit)  124/86    Heart Rate (Admit)  95 bpm    Heart Rate (Exercise)  90 bpm    Heart Rate (Exit)  95 bpm    Oxygen Saturation (Admit)  107 %    Oxygen Saturation (Exercise)  123 %    Oxygen Saturation (Exit)  109 %    Rating of Perceived Exertion (Exercise)  13    Perceived Dyspnea (Exercise)  1    Duration  Progress to 45 minutes of aerobic exercise without signs/symptoms of physical distress    Intensity  THRR unchanged      Progression   Progression  Continue to progress workloads to maintain intensity without signs/symptoms of physical distress.      Resistance Training   Training Prescription  Yes    Weight  orange bands    Reps  10-15      Treadmill   MPH  2    Grade  2    Minutes  15      NuStep   Level  3    SPM  80    Minutes  15    METs  2.3        Social History   Tobacco Use  Smoking Status Never Smoker  Smokeless Tobacco Never Used    Goals Met:  Exercise tolerated well No report of cardiac concerns or symptoms Strength training completed today  Goals Unmet:  Not Applicable  Comments: Service time is from 0950 to 78    Dr. Rush Farmer is Medical Director for Pulmonary Rehab at [facility].

## 2018-10-07 ENCOUNTER — Telehealth (HOSPITAL_COMMUNITY): Payer: Self-pay | Admitting: Family Medicine

## 2018-10-07 ENCOUNTER — Encounter (HOSPITAL_COMMUNITY): Payer: PPO

## 2018-10-12 ENCOUNTER — Other Ambulatory Visit: Payer: Self-pay

## 2018-10-12 ENCOUNTER — Encounter (HOSPITAL_COMMUNITY)
Admission: RE | Admit: 2018-10-12 | Discharge: 2018-10-12 | Disposition: A | Discharge status: Home / Self Care | Payer: PPO | Source: Ambulatory Visit | Attending: Internal Medicine | Admitting: Internal Medicine

## 2018-10-12 VITALS — Wt 188.5 lb

## 2018-10-12 DIAGNOSIS — J849 Interstitial pulmonary disease, unspecified: Secondary | ICD-10-CM

## 2018-10-12 NOTE — Progress Notes (Signed)
Daily Session Note  Patient Details  Name: Eduardo Fuentes MRN: [id number] Date of Birth: [birth year] Referring Provider:     Pulmonary Rehab Walk Test from 04/06/2018 in [place]  Referring Provider  Dr. Chase Caller      Encounter Date: 10/12/2018  Check In: Session Check In - 10/12/18 0959      Check-In   Supervising physician immediately available to respond to emergencies  [facility] immediately available    Physician(s)  Dr. [place]    Location  [facility]    Staff Present  Rosebud Poles, RN, Bjorn Loser, MS, Exercise Physiologist    Virtual Visit  No    Medication changes reported      No    Fall or balance concerns reported     No    Tobacco Cessation  No Change    Warm-up and Cool-down  Performed as group-led instruction    Resistance Training Performed  Yes    VAD Patient?  No    PAD/SET Patient?  No      Pain Assessment   Currently in Pain?  No/denies    Multiple Pain Sites  No       Capillary Blood Glucose: No results found for this or any previous visit (from the past 24 hour(s)).    Social History   Tobacco Use  Smoking Status Never Smoker  Smokeless Tobacco Never Used    Goals Met:  Proper associated with RPD/PD & O2 Sat Exercise tolerated well Strength training completed today  Goals Unmet:  Not Applicable  Comments: Service time is from [place] to 75   Dr. Rush Farmer is Medical Director for Pulmonary Rehab at [facility].

## 2018-10-14 ENCOUNTER — Other Ambulatory Visit: Payer: Self-pay

## 2018-10-14 ENCOUNTER — Encounter (HOSPITAL_COMMUNITY)
Admission: RE | Admit: 2018-10-14 | Discharge: 2018-10-14 | Disposition: A | Discharge status: Home / Self Care | Payer: PPO | Source: Ambulatory Visit | Attending: Internal Medicine | Admitting: Internal Medicine

## 2018-10-14 DIAGNOSIS — J849 Interstitial pulmonary disease, unspecified: Secondary | ICD-10-CM

## 2018-10-14 NOTE — Progress Notes (Signed)
Daily Session Note  Patient Details  Name: Eduardo Fuentes MRN: [id number] Date of Birth: December 03, 1950 Referring Provider:     Pulmonary Rehab Walk Test from 04/06/2018 in [place]  Referring Provider  Dr. Chase Caller      Encounter Date: 10/14/2018  Check In: Session Check In - 10/14/18 1002      Check-In   Supervising physician immediately available to respond to emergencies  [facility] immediately available    Physician(s)  Dr. Gertie Fey    Location  [facility]    Staff Present  Rodney Langton, RN;Joan Leonia Reeves, RN, BSN;Brittany Durene Fruits, BS, ACSM CEP, Exercise Physiologist    Virtual Visit  No    Medication changes reported      No    Fall or balance concerns reported     No    Tobacco Cessation  No Change    Warm-up and Cool-down  Performed on first and last piece of equipment    Resistance Training Performed  Yes    VAD Patient?  No    PAD/SET Patient?  No      Pain Assessment   Currently in Pain?  No/denies    Multiple Pain Sites  No       Capillary Blood Glucose: No results found for this or any previous visit (from the past 24 hour(s)).    Social History   Tobacco Use  Smoking Status Never Smoker  Smokeless Tobacco Never Used    Goals Met:  Exercise tolerated well No report of cardiac concerns or symptoms Strength training completed today  Goals Unmet:  Not Applicable  Comments: Service time is from 0953 to 1030    Dr. Rush Farmer is Medical Director for Pulmonary Rehab at [facility].

## 2018-10-19 ENCOUNTER — Encounter (HOSPITAL_COMMUNITY)
Admission: RE | Admit: 2018-10-19 | Discharge: 2018-10-19 | Disposition: A | Discharge status: Home / Self Care | Payer: PPO | Source: Ambulatory Visit | Attending: Internal Medicine | Admitting: Internal Medicine

## 2018-10-19 VITALS — Wt 186.1 lb

## 2018-10-19 DIAGNOSIS — J849 Interstitial pulmonary disease, unspecified: Secondary | ICD-10-CM

## 2018-10-19 NOTE — Progress Notes (Signed)
Daily Session Note  Patient Details  Name: Eduardo Fuentes MRN: [id number] Date of Birth: [birth year] Referring Provider:     Pulmonary Rehab Walk Test from 04/06/2018 in [place]  Referring Provider  Dr. Chase Caller      Encounter Date: 10/19/2018  Check In: Session Check In - 10/19/18 0951      Check-In   Supervising physician immediately available to respond to emergencies  [facility] immediately available    Physician(s)  Dr. Benny Lennert    Location  [facility]    Staff Present  Rosebud Poles, RN, Bjorn Loser, MS, Exercise Physiologist;Lisa Ysidro Evert, RN    Tobacco Cessation  No Change    Warm-up and Cool-down  Performed as group-led Higher education careers adviser Performed  Yes    VAD Patient?  No    PAD/SET Patient?  No      Pain Assessment   Currently in Pain?  No/denies    Multiple Pain Sites  No       Capillary Blood Glucose: No results found for this or any previous visit (from the past 24 hour(s)).  Exercise Prescription Changes - 10/19/18 1200      Response to Exercise   Blood Pressure (Admit)  110/80    Blood Pressure (Exercise)  148/78    Blood Pressure (Exit)  114/76    Heart Rate (Admit)  103 bpm    Heart Rate (Exercise)  132 bpm    Heart Rate (Exit)  106 bpm    Oxygen Saturation (Admit)  94 %    Oxygen Saturation (Exercise)  89 %    Oxygen Saturation (Exit)  95 %    Rating of Perceived Exertion (Exercise)  13    Perceived Dyspnea (Exercise)  1    Duration  Continue with 30 min of aerobic exercise without signs/symptoms of physical distress.    Intensity  THRR unchanged      Progression   Progression  Continue to progress workloads to maintain intensity without signs/symptoms of physical distress.      Resistance Training   Training Prescription  Yes    Weight  orange bands    Reps  10-15    Time  10 Minutes      Treadmill   MPH  2.2    Grade  2    Minutes  15      NuStep   Level  4    SPM  80    Minutes  15    METs  2.1       Social History   Tobacco Use  Smoking Status Never Smoker  Smokeless Tobacco Never Used    Goals Met:  Exercise tolerated well  Goals Unmet:  Not Applicable  Comments: Service time is from 0950 to 23    Dr. Rush Farmer is Medical Director for Pulmonary Rehab at [facility].

## 2018-10-19 NOTE — Progress Notes (Deleted)
Pulmonary Individual Treatment Plan  Patient Details  Name: Eduardo Fuentes MRN: [id number] Date of Birth: [birth year] Referring Provider:     Pulmonary Rehab Walk Test from 04/06/2018 in [place]  Referring Provider  Dr. Chase Caller      Initial Encounter Date:    Pulmonary Rehab Walk Test from 04/06/2018 in [place]  Date  04/06/18      Visit Diagnosis: Interstitial lung disease ([place])  Patient's Home Medications on Admission:   Current Outpatient Medications:  .  benzonatate (TESSALON) 200 MG capsule, Take 1 capsule (200 mg total) by mouth 3 (three) times daily as needed for cough., Disp: 30 capsule, Rfl: 1 .  clonazePAM (KLONOPIN) 1 MG tablet, Take 1 tablet at 8 am, 1/2 tablet at 2 pm and 1 tablet at bedtime for anxiety. (Patient taking differently: Take 1 mg by mouth 3 (three) times daily. Pt states "I usually take 1 tablet three times a day"), Disp: 75 tablet, Rfl: 0 .  dimenhyDRINATE (DRAMAMINE PO), Take by mouth., Disp: , Rfl:  .  gabapentin (NEURONTIN) 300 MG capsule, Take 300 mg by mouth once., Disp: , Rfl:  .  hydrochlorothiazide (MICROZIDE) 12.5 MG capsule, Take 12.5 mg by mouth daily., Disp: , Rfl:  .  mirtazapine (REMERON) 30 MG tablet, TK 1 T PO QHS, Disp: , Rfl: 4 .  NEOMYCIN-POLYMYXIN-HYDROCORTISONE (CORTISPORIN) 1 % SOLN OTIC solution, Apply to bed after soaking, Disp: 10 mL, Rfl: 0 .  nortriptyline (PAMELOR) 50 MG capsule, Take 25 mg by mouth daily at 8 pm. , Disp: , Rfl:  .  OFEV 150 MG CAPS, Take 150 mg by mouth 2 (two) times daily. , Disp: , Rfl:  .  ondansetron (ZOFRAN) 4 MG tablet, Take 1 tablet (4 mg total) by mouth every 8 (eight) hours as needed for nausea or vomiting., Disp: 30 tablet, Rfl: 0 .  pantoprazole (PROTONIX) 40 MG tablet, Take 1 tablet (40 mg total) by mouth daily., Disp: 90 tablet, Rfl: 3  Past Medical History: Past Medical History:  Diagnosis Date  . Arthritis    NECK  . Cough     NON-PRODUCTIVE  . Depressive disorder    follow by dr [facility]  . Diverticulosis   . Diverticulosis of colon   . Generalized anxiety disorder   . GERD (gastroesophageal reflux disease)    WATCHES DIET  . Hemorrhoid   . High blood pressure   . History of colon polyps   . History of kidney stones   . Microscopic hematuria   . Nocturia   . Panic attacks   . Prostate cancer ([place])   . Sinus infection   . Tinnitus    LEFT EAR -- CHRONIC    Tobacco Use: Social History   Tobacco Use  Smoking Status Never Smoker  Smokeless Tobacco Never Used    Labs: Recent Review Scientist, physiological    Labs for ITP Cardiac and Pulmonary Rehab Latest Ref Rng & Units 10/29/2011   Cholestrol 0 - 200 mg/dL 162   LDLCALC 0 - 99 mg/dL 101(H)   HDL >39 mg/dL 42   Trlycerides <150 mg/dL 94      Capillary Blood Glucose: No results found for: GLUCAP   Pulmonary Assessment Scores:  UCSD: Self-administered rating of dyspnea associated with activities of daily living (ADLs) 6-point scale (0 = "not at all" to 5 = "maximal or unable to do because of breathlessness")  Scoring Scores range from 0 to  120.  Minimally important difference is 5 units  CAT: CAT can identify the health impairment of COPD patients and is better correlated with disease progression.  CAT has a scoring range of zero to 40. The CAT score is classified into four groups of low (less than 10), medium (10 - 20), high (21-30) and very high (31-40) based on the impact level of disease on health status. A CAT score over 10 suggests significant symptoms.  A worsening CAT score could be explained by an exacerbation, poor medication adherence, poor inhaler technique, or progression of COPD or comorbid conditions.  CAT MCID is 2 points  mMRC: mMRC (Modified Medical Research Council) Dyspnea Scale is used to assess the degree of baseline functional disability in patients of respiratory disease due to dyspnea. No minimal important  difference is established. A decrease in score of 1 point or greater is considered a positive change.   Pulmonary Function Assessment:   Exercise Target Goals: Exercise Program Goal: Individual exercise prescription set using results from initial 6 min walk test and THRR while considering  patient's activity barriers and safety.   Exercise Prescription Goal: Initial exercise prescription builds to 30-45 minutes a day of aerobic activity, 2-3 days per week.  Home exercise guidelines will be given to patient during program as part of exercise prescription that the participant will acknowledge.  Activity Barriers & Risk Stratification:   6 Minute Walk:   Oxygen Initial Assessment:   Oxygen Re-Evaluation: Oxygen Re-Evaluation    Row Name 04/27/18 3300 05/17/18 1017 10/18/18 0944         Program Oxygen Prescription   Program Oxygen Prescription  None  None  None       Home Oxygen   Home Oxygen Device  None  None  None     Sleep Oxygen Prescription  None  None  None     Home Exercise Oxygen Prescription  None  None  None     Home at Rest Exercise Oxygen Prescription  None  None  None       Goals/Expected Outcomes   Short Term Goals  To learn and exhibit compliance with exercise, home and travel O2 prescription;To learn and understand importance of monitoring SPO2 with pulse oximeter and demonstrate accurate use of the pulse oximeter.;To learn and understand importance of maintaining oxygen saturations>88%;To learn and demonstrate proper pursed lip breathing techniques or other breathing techniques.;To learn and demonstrate proper use of respiratory medications  To learn and exhibit compliance with exercise, home and travel O2 prescription;To learn and understand importance of monitoring SPO2 with pulse oximeter and demonstrate accurate use of the pulse oximeter.;To learn and understand importance of maintaining oxygen saturations>88%;To learn and demonstrate proper pursed lip  breathing techniques or other breathing techniques.;To learn and demonstrate proper use of respiratory medications  To learn and exhibit compliance with exercise, home and travel O2 prescription;To learn and understand importance of monitoring SPO2 with pulse oximeter and demonstrate accurate use of the pulse oximeter.;To learn and understand importance of maintaining oxygen saturations>88%;To learn and demonstrate proper use of respiratory medications;To learn and demonstrate proper pursed lip breathing techniques or other breathing techniques.     Long  Term Goals  Exhibits compliance with exercise, home and travel O2 prescription;Verbalizes importance of monitoring SPO2 with pulse oximeter and return demonstration;Maintenance of O2 saturations>88%;Compliance with respiratory medication;Demonstrates proper use of MDI's;Exhibits proper breathing techniques, such as pursed lip breathing or other method taught during program session  Exhibits compliance with exercise, home and  travel O2 prescription;Verbalizes importance of monitoring SPO2 with pulse oximeter and return demonstration;Maintenance of O2 saturations>88%;Compliance with respiratory medication;Demonstrates proper use of MDI's;Exhibits proper breathing techniques, such as pursed lip breathing or other method taught during program session  Compliance with respiratory medication;Exhibits proper breathing techniques, such as pursed lip breathing or other method taught during program session;Maintenance of O2 saturations>88%;Verbalizes importance of monitoring SPO2 with pulse oximeter and return demonstration;Exhibits compliance with exercise, home and travel O2 prescription     Goals/Expected Outcomes  compliance   compliance   compliance        Oxygen Discharge (Final Oxygen Re-Evaluation): Oxygen Re-Evaluation - 10/18/18 0944      Program Oxygen Prescription   Program Oxygen Prescription  None      Home Oxygen   Home Oxygen Device  None     Sleep Oxygen Prescription  None    Home Exercise Oxygen Prescription  None    Home at Rest Exercise Oxygen Prescription  None      Goals/Expected Outcomes   Short Term Goals  To learn and exhibit compliance with exercise, home and travel O2 prescription;To learn and understand importance of monitoring SPO2 with pulse oximeter and demonstrate accurate use of the pulse oximeter.;To learn and understand importance of maintaining oxygen saturations>88%;To learn and demonstrate proper use of respiratory medications;To learn and demonstrate proper pursed lip breathing techniques or other breathing techniques.    Long  Term Goals  Compliance with respiratory medication;Exhibits proper breathing techniques, such as pursed lip breathing or other method taught during program session;Maintenance of O2 saturations>88%;Verbalizes importance of monitoring SPO2 with pulse oximeter and return demonstration;Exhibits compliance with exercise, home and travel O2 prescription    Goals/Expected Outcomes  compliance       Initial Exercise Prescription:   Perform Capillary Blood Glucose checks as needed.  Exercise Prescription Changes: Exercise Prescription Changes    Row Name 04/27/18 1200 05/04/18 1600 09/21/18 1200 10/05/18 1100       Response to Exercise   Blood Pressure (Admit)  102/58  112/70  100/66  112/60    Blood Pressure (Exercise)  100/74  126/70  120/70  130/80    Blood Pressure (Exit)  100/66  100/76  112/74  124/86    Heart Rate (Admit)  99 bpm  108 bpm  97 bpm  95 bpm    Heart Rate (Exercise)  126 bpm  124 bpm  90 bpm  90 bpm    Heart Rate (Exit)  87 bpm  106 bpm  95 bpm  95 bpm    Oxygen Saturation (Admit)  96 %  95 %  97 %  107 %    Oxygen Saturation (Exercise)  90 %  90 %  126 %  123 %    Oxygen Saturation (Exit)  95 %  95 %  102 %  109 %    Rating of Perceived Exertion (Exercise)  '13  13  13  13    '$ Perceived Dyspnea (Exercise)  '1  1  2  1    '$ Duration  Progress to 45 minutes of  aerobic exercise without signs/symptoms of physical distress  Progress to 45 minutes of aerobic exercise without signs/symptoms of physical distress  Progress to 45 minutes of aerobic exercise without signs/symptoms of physical distress  Progress to 45 minutes of aerobic exercise without signs/symptoms of physical distress    Intensity  Other (comment) 40-80% of HRR  THRR unchanged  THRR unchanged  THRR unchanged  Progression   Progression  Continue to progress workloads to maintain intensity without signs/symptoms of physical distress.  Continue to progress workloads to maintain intensity without signs/symptoms of physical distress.  Continue to progress workloads to maintain intensity without signs/symptoms of physical distress.  Continue to progress workloads to maintain intensity without signs/symptoms of physical distress.      Resistance Training   Training Prescription  Yes  Yes  Yes  Yes    Weight  orange bands  orange bands  orange bands  orange bands    Reps  10-15  10-15  10-15  10-15    Time  10 Minutes  10 Minutes  10 Minutes  -      Treadmill   MPH  1.6  2  1.8  2    Grade  '2  2  2  2    '$ Minutes  '17  17  15  15      '$ Bike   Level  0.5  0.5  -  -    Minutes  17  17  -  -      NuStep   Level  '3  3  2  3    '$ SPM  80  80  80  80    Minutes  '17  17  15  15    '$ METs  2.1  2  1.9  2.3      Home Exercise Plan   Plans to continue exercise at  [employer] (comment) [employer]  -  -    Frequency  -  Add 3 additional days to program exercise sessions.  -  -    Initial Home Exercises Provided  -  05/04/18  -  -       Exercise Comments: Exercise Comments    Row Name 05/04/18 1608           Exercise Comments  home exercise complete          Exercise Goals and Review:   Exercise Goals Re-Evaluation : Exercise Goals Re-Evaluation    [place] Name 04/27/18 0820 05/17/18 1017 10/18/18 0942         Exercise Goal Re-Evaluation   Exercise Goals Review   Increase Physical Activity;Increase Strength and Stamina;Able to understand and use rate of perceived exertion (RPE) scale;Able to understand and use Dyspnea scale;Knowledge and understanding of Target Heart Rate Range (THRR);Understanding of Exercise Prescription  Increase Physical Activity;Increase Strength and Stamina;Able to understand and use rate of perceived exertion (RPE) scale;Able to understand and use Dyspnea scale;Knowledge and understanding of Target Heart Rate Range (THRR);Understanding of Exercise Prescription  Increase Physical Activity;Increase Strength and Stamina;Able to understand and use rate of perceived exertion (RPE) scale;Able to understand and use Dyspnea scale;Knowledge and understanding of Target Heart Rate Range (THRR);Understanding of Exercise Prescription     Comments  Pt has completed 3 exercise sessions and is motivated to exercise. Pt is currently exercising at 2.87 METs while on the treadmill. I expect the pt to progress well through the program. Will continue to monitor and progress as able.   Pt exercises at 3.1 METs and is motivated to progress through the program. Our department is currently closed and will be for an unknown period of time. Pt has previously been instructed on home exercise. Will call to follow up and give updates to pt later in the week after I recieve them.   Pt has completed 7 exercise sessions since we have reopened.  Pt is able to walk at 3.29 METs while on the treadmill. Pt is accepting of workload increases. Pt has a very positive attitude and is a pleasure to be around. Will continue to monitor and progress as able.     Expected Outcomes  Through exercise at rehab and at home, the patient will decrease shortness of breath with daily activities and feel confident in carrying out an exercise regime at home.   Through exercise at rehab and at home, the patient will decrease shortness of breath with daily activities and feel confident in carrying out an  exercise regime at home.   Through exercise at rehab and at home, the patient will decrease shortness of breath with daily activities and feel confident in carrying out an exercise regime at home.        Discharge Exercise Prescription (Final Exercise Prescription Changes): Exercise Prescription Changes - 10/05/18 1100      Response to Exercise   Blood Pressure (Admit)  112/60    Blood Pressure (Exercise)  130/80    Blood Pressure (Exit)  124/86    Heart Rate (Admit)  95 bpm    Heart Rate (Exercise)  90 bpm    Heart Rate (Exit)  95 bpm    Oxygen Saturation (Admit)  107 %    Oxygen Saturation (Exercise)  123 %    Oxygen Saturation (Exit)  109 %    Rating of Perceived Exertion (Exercise)  13    Perceived Dyspnea (Exercise)  1    Duration  Progress to 45 minutes of aerobic exercise without signs/symptoms of physical distress    Intensity  THRR unchanged      Progression   Progression  Continue to progress workloads to maintain intensity without signs/symptoms of physical distress.      Resistance Training   Training Prescription  Yes    Weight  orange bands    Reps  10-15      Treadmill   MPH  2    Grade  2    Minutes  15      NuStep   Level  3    SPM  80    Minutes  15    METs  2.3       Nutrition:  Target Goals: Understanding of nutrition guidelines, daily intake of sodium '1500mg'$ , cholesterol '200mg'$ , calories 30% from fat and 7% or less from saturated fats, daily to have 5 or more servings of fruits and vegetables.  Biometrics:    Nutrition Therapy Plan and Nutrition Goals:   Nutrition Assessments:   Nutrition Goals Re-Evaluation:   Nutrition Goals Discharge (Final Nutrition Goals Re-Evaluation):   Psychosocial: Target Goals: Acknowledge presence or absence of significant depression and/or stress, maximize coping skills, provide positive support system. Participant is able to verbalize types and ability to use techniques and skills needed for reducing  stress and depression.  Initial Review & Psychosocial Screening:   Quality of Life Scores:  Scores of 19 and below usually indicate a poorer quality of life in these areas.  A difference of  2-3 points is a clinically meaningful difference.  A difference of 2-3 points in the total score of the Quality of Life Index has been associated with significant improvement in overall quality of life, self-image, physical symptoms, and general health in studies assessing change in quality of life.   PHQ-9: Recent Review Flowsheet Data    Depression screen [facility] 2/9 04/05/2018   Decreased Interest 0   Down, Depressed,  Hopeless 0   PHQ - 2 Score 0   Altered sleeping 0   Tired, decreased energy 2   Change in appetite 0   Feeling bad or failure about yourself  0   Trouble concentrating 0   Moving slowly or fidgety/restless 0   Suicidal thoughts 0   Difficult doing work/chores Not difficult at all     Interpretation of Total Score  Total Score Depression Severity:  1-4 = Minimal depression, 5-9 = Mild depression, 10-14 = Moderate depression, 15-19 = Moderately severe depression, 20-27 = Severe depression   Psychosocial Evaluation and Intervention: Psychosocial Evaluation - 10/18/18 0905      Psychosocial Evaluation & Interventions   Interventions  Encouraged to exercise with the program and follow exercise prescription;Stress management education;Relaxation education    Comments  Depression is stable with current treatment    Expected Outcomes  Continued stability of depression    Continue Psychosocial Services   Follow up required by staff       Psychosocial Re-Evaluation: Psychosocial Re-Evaluation    [place] Name 04/27/18 0756 04/27/18 0801 05/17/18 1421 10/18/18 0906       Psychosocial Re-Evaluation   Current issues with  Current Depression;History of Depression;Current Psychotropic Meds  Current Depression;History of Depression;Current Psychotropic Meds  Current Depression;History of  Depression;Current Psychotropic Meds  History of Depression;Current Depression    Comments  -  Pt is being seen by a psychiatrist for a history of depression, but states that this helps him. He had been prescribed medications, which has been successful and he fells alot better.   Pt continue to be seen by a psychiatrist for a history of depression, and states that this helps him. He had been prescribed medications, which has been successful and he fells alot better.vRehab is currently closed and will plan to re-evaulate pt progress upon reopening.   Is very happy to return back to pulmonary rehab after COVID-19 closure for 3 months, depression stable    Expected Outcomes  -  Pt will remain under care of PCP and psychiatrist and will report changes to staff. Pt will remain free of barriers to participation under current treatment.  Pt will remain under care of PCP and psychiatrist and will report changes to staff. Pt will remain free of barriers to participation under current treatment.  Continued stability of depression    Interventions  Stress management education;Relaxation education;Encouraged to attend Pulmonary Rehabilitation for the exercise  Stress management education;Relaxation education;Encouraged to attend Pulmonary Rehabilitation for the exercise  Stress management education;Relaxation education;Encouraged to attend Pulmonary Rehabilitation for the exercise  Encouraged to attend Pulmonary Rehabilitation for the exercise;Relaxation education;Stress management education    Continue Psychosocial Services   Follow up required by staff  Follow up required by staff  Follow up required by staff  Follow up required by staff       Psychosocial Discharge (Final Psychosocial Re-Evaluation): Psychosocial Re-Evaluation - 10/18/18 0906      Psychosocial Re-Evaluation   Current issues with  History of Depression;Current Depression    Comments  Is very happy to return back to pulmonary rehab after COVID-19  closure for 3 months, depression stable    Expected Outcomes  Continued stability of depression    Interventions  Encouraged to attend Pulmonary Rehabilitation for the exercise;Relaxation education;Stress management education    Continue Psychosocial Services   Follow up required by staff        Education: Education Goals: Education classes will be provided on a weekly basis, covering  required topics. Participant will state understanding/return demonstration of topics presented.  Learning Barriers/Preferences:   Education Topics: How Lungs Work and Diseases: - Discuss the anatomy of the lungs and diseases that can affect the lungs, such as COPD.   Exercise: -Discuss the importance of exercise, FITT principles of exercise, normal and abnormal responses to exercise, and how to exercise safely.   Environmental Irritants: -Discuss types of environmental irritants and how to limit exposure to environmental irritants.   Meds/Inhalers and oxygen: - Discuss respiratory medications, definition of an inhaler and oxygen, and the proper way to use an inhaler and oxygen.   Energy Saving Techniques: - Discuss methods to conserve energy and decrease shortness of breath when performing activities of daily living.    Bronchial Hygiene / Breathing Techniques: - Discuss breathing mechanics, pursed-lip breathing technique,  proper posture, effective ways to clear airways, and other functional breathing techniques   Cleaning Equipment: - Provides group verbal and written instruction about the health risks of elevated stress, cause of high stress, and healthy ways to reduce stress.   Nutrition I: Fats: - Discuss the types of cholesterol, what cholesterol does to the body, and how cholesterol levels can be controlled.   Nutrition II: Labels: -Discuss the different components of food labels and how to read food labels.   Respiratory Infections: - Discuss the signs and symptoms of  respiratory infections, ways to prevent respiratory infections, and the importance of seeking medical treatment when having a respiratory infection.   Stress I: Signs and Symptoms: - Discuss the causes of stress, how stress may lead to anxiety and depression, and ways to limit stress.   Stress II: Relaxation: -Discuss relaxation techniques to limit stress.   Oxygen for Home/Travel: - Discuss how to prepare for travel when on oxygen and proper ways to transport and store oxygen to ensure safety.   Knowledge Questionnaire Score:   Core Components/Risk Factors/Patient Goals at Admission:   Core Components/Risk Factors/Patient Goals Review:  Goals and Risk Factor Review    Row Name 04/27/18 0805 05/17/18 1422 10/18/18 0909 10/18/18 0915       Core Components/Risk Factors/Patient Goals Review   Personal Goals Review  Develop more efficient breathing techniques such as purse lipped breathing and diaphragmatic breathing and practicing self-pacing with activity.;Increase knowledge of respiratory medications and ability to use respiratory devices properly.;Improve shortness of breath with ADL's  Develop more efficient breathing techniques such as purse lipped breathing and diaphragmatic breathing and practicing self-pacing with activity.;Increase knowledge of respiratory medications and ability to use respiratory devices properly.;Improve shortness of breath with ADL's  Increase knowledge of respiratory medications and ability to use respiratory devices properly.;Stress;Improve shortness of breath with ADL's;Develop more efficient breathing techniques such as purse lipped breathing and diaphragmatic breathing and practicing self-pacing with activity.  -    Review  Eduardo Fuentes is off to a great start in rehab. He has completed 3 exercise sessions and 1 education session. He has already shown improvement in his workloads of an increase to 0.5 on the airdyne, and increase to level 3 on the nustep and  maintaining at 1.6/2.0 on the treadmill. Pt also consistently reports mild SOB. Pt is able to demonstrate PLB technique with verbal cues and will hope to become more independent with this as he progresses in rehab. Will continue to monitor for progress at next 30 day review.  He has completed 5 exercise sessions and 1 education session. He has already shown improvement in his workloads with 0.5 on the  airdyne, level 3 on the nustep and increasing to 2.0/2.0 on the treadmill. Pt also consistently reports mild SOB. Pt is able to demonstrate PLB technique with verbal cues and will hope to become more independent with this as he progresses in rehab. Rehab is currently closed and will plan to re-evaulate pt progress upon reopening.  Pulmonary rehab reopened after being closed 3 months for COVID-19 precautions.  Patient has completed 12 exercise sessions and is progressing well.  He is exercising on the treadmill at a speed of 2.2 mph and an incline of 2% and level 3 on the nustep with a met level of 2.1.  He is to graduate in 2 weeks.  -    Expected Outcomes  see admission goals and outcomes  see admission goals and outcomes  For patient to continue exercising on his own after graduating from pulmonary rehab.  He wants to exercise at [employer] when the gyms reopen across the [place].  see admission goals       Core Components/Risk Factors/Patient Goals at Discharge (Final Review):  Goals and Risk Factor Review - 10/18/18 0915      Core Components/Risk Factors/Patient Goals Review   Expected Outcomes  see admission goals       ITP Comments: ITP Comments    Row Name 04/27/18 0748 05/17/18 1420         ITP Comments  Dr. Jennet Maduro, Medial Director, Pulmonary rehab  Dr. Jennet Maduro, Medial Director, Pulmonary rehab         Comments: ITP REVIEW Pt is making expected progress toward pulmonary rehab goals after completing 12 sessions. Recommend continued exercise, life style modification,  education, and utilization of breathing techniques to increase stamina and strength and decrease shortness of breath with exertion.

## 2018-10-19 NOTE — Progress Notes (Signed)
Pulmonary Individual Treatment Plan  Patient Details  Name: Eduardo Fuentes MRN: [id number] Date of Birth: 09-12-1950 Referring Provider:     Pulmonary Rehab Walk Test from 04/06/2018 in [facility]  Referring Provider  Dr. Chase Caller      Initial Encounter Date:    Pulmonary Rehab Walk Test from 04/06/2018 in [facility]  Date  04/06/18      Visit Diagnosis: Interstitial lung disease ([place])  Patient's Home Medications on Admission:   Current Outpatient Medications:  .  benzonatate (TESSALON) 200 MG capsule, Take 1 capsule (200 mg total) by mouth 3 (three) times daily as needed for cough., Disp: 30 capsule, Rfl: 1 .  clonazePAM (KLONOPIN) 1 MG tablet, Take 1 tablet at 8 am, 1/2 tablet at 2 pm and 1 tablet at bedtime for anxiety. (Patient taking differently: Take 1 mg by mouth 3 (three) times daily. Pt states "I usually take 1 tablet three times a day"), Disp: 75 tablet, Rfl: 0 .  dimenhyDRINATE (DRAMAMINE PO), Take by mouth., Disp: , Rfl:  .  gabapentin (NEURONTIN) 300 MG capsule, Take 300 mg by mouth once., Disp: , Rfl:  .  hydrochlorothiazide (MICROZIDE) 12.5 MG capsule, Take 12.5 mg by mouth daily., Disp: , Rfl:  .  mirtazapine (REMERON) 30 MG tablet, TK 1 T PO QHS, Disp: , Rfl: 4 .  NEOMYCIN-POLYMYXIN-HYDROCORTISONE (CORTISPORIN) 1 % SOLN OTIC solution, Apply to bed after soaking, Disp: 10 mL, Rfl: 0 .  nortriptyline (PAMELOR) 50 MG capsule, Take 25 mg by mouth daily at 8 pm. , Disp: , Rfl:  .  OFEV 150 MG CAPS, Take 150 mg by mouth 2 (two) times daily. , Disp: , Rfl:  .  ondansetron (ZOFRAN) 4 MG tablet, Take 1 tablet (4 mg total) by mouth every 8 (eight) hours as needed for nausea or vomiting., Disp: 30 tablet, Rfl: 0 .  pantoprazole (PROTONIX) 40 MG tablet, Take 1 tablet (40 mg total) by mouth daily., Disp: 90 tablet, Rfl: 3  Past Medical History: Past Medical History:  Diagnosis Date  . Arthritis    NECK  . Cough     NON-PRODUCTIVE  . Depressive disorder    follow by dr [facility]  . Diverticulosis   . Diverticulosis of colon   . Generalized anxiety disorder   . GERD (gastroesophageal reflux disease)    WATCHES DIET  . Hemorrhoid   . High blood pressure   . History of colon polyps   . History of kidney stones   . Microscopic hematuria   . Nocturia   . Panic attacks   . Prostate cancer ([place])   . Sinus infection   . Tinnitus    LEFT EAR -- CHRONIC    Tobacco Use: Social History   Tobacco Use  Smoking Status Never Smoker  Smokeless Tobacco Never Used    Labs: Recent Review Scientist, physiological    Labs for ITP Cardiac and Pulmonary Rehab Latest Ref Rng & Units 10/29/2011   Cholestrol 0 - 200 mg/dL 162   LDLCALC 0 - 99 mg/dL 101(H)   HDL >39 mg/dL 42   Trlycerides <150 mg/dL 94      Capillary Blood Glucose: No results found for: GLUCAP   Pulmonary Assessment Scores:  UCSD: Self-administered rating of dyspnea associated with activities of daily living (ADLs) 6-point scale (0 = "not at all" to 5 = "maximal or unable to do because of breathlessness")  Scoring Scores range from 0 to  120.  Minimally important difference is 5 units  CAT: CAT can identify the health impairment of COPD patients and is better correlated with disease progression.  CAT has a scoring range of zero to 40. The CAT score is classified into four groups of low (less than 10), medium (10 - 20), high (21-30) and very high (31-40) based on the impact level of disease on health status. A CAT score over 10 suggests significant symptoms.  A worsening CAT score could be explained by an exacerbation, poor medication adherence, poor inhaler technique, or progression of COPD or comorbid conditions.  CAT MCID is 2 points  mMRC: mMRC (Modified Medical Research Council) Dyspnea Scale is used to assess the degree of baseline functional disability in patients of respiratory disease due to dyspnea. No minimal important  difference is established. A decrease in score of 1 point or greater is considered a positive change.   Pulmonary Function Assessment:   Exercise Target Goals: Exercise Program Goal: Individual exercise prescription set using results from initial 6 min walk test and THRR while considering  patient's activity barriers and safety.   Exercise Prescription Goal: Initial exercise prescription builds to 30-45 minutes a day of aerobic activity, 2-3 days per week.  Home exercise guidelines will be given to patient during program as part of exercise prescription that the participant will acknowledge.  Activity Barriers & Risk Stratification:   6 Minute Walk:   Oxygen Initial Assessment:   Oxygen Re-Evaluation: Oxygen Re-Evaluation    Row Name 04/27/18 9211 05/17/18 1017 10/18/18 0944         Program Oxygen Prescription   Program Oxygen Prescription  None  None  None       Home Oxygen   Home Oxygen Device  None  None  None     Sleep Oxygen Prescription  None  None  None     Home Exercise Oxygen Prescription  None  None  None     Home at Rest Exercise Oxygen Prescription  None  None  None       Goals/Expected Outcomes   Short Term Goals  To learn and exhibit compliance with exercise, home and travel O2 prescription;To learn and understand importance of monitoring SPO2 with pulse oximeter and demonstrate accurate use of the pulse oximeter.;To learn and understand importance of maintaining oxygen saturations>88%;To learn and demonstrate proper pursed lip breathing techniques or other breathing techniques.;To learn and demonstrate proper use of respiratory medications  To learn and exhibit compliance with exercise, home and travel O2 prescription;To learn and understand importance of monitoring SPO2 with pulse oximeter and demonstrate accurate use of the pulse oximeter.;To learn and understand importance of maintaining oxygen saturations>88%;To learn and demonstrate proper pursed lip  breathing techniques or other breathing techniques.;To learn and demonstrate proper use of respiratory medications  To learn and exhibit compliance with exercise, home and travel O2 prescription;To learn and understand importance of monitoring SPO2 with pulse oximeter and demonstrate accurate use of the pulse oximeter.;To learn and understand importance of maintaining oxygen saturations>88%;To learn and demonstrate proper use of respiratory medications;To learn and demonstrate proper pursed lip breathing techniques or other breathing techniques.     Long  Term Goals  Exhibits compliance with exercise, home and travel O2 prescription;Verbalizes importance of monitoring SPO2 with pulse oximeter and return demonstration;Maintenance of O2 saturations>88%;Compliance with respiratory medication;Demonstrates proper use of MDI's;Exhibits proper breathing techniques, such as pursed lip breathing or other method taught during program session  Exhibits compliance with exercise, home and  travel O2 prescription;Verbalizes importance of monitoring SPO2 with pulse oximeter and return demonstration;Maintenance of O2 saturations>88%;Compliance with respiratory medication;Demonstrates proper use of MDI's;Exhibits proper breathing techniques, such as pursed lip breathing or other method taught during program session  Compliance with respiratory medication;Exhibits proper breathing techniques, such as pursed lip breathing or other method taught during program session;Maintenance of O2 saturations>88%;Verbalizes importance of monitoring SPO2 with pulse oximeter and return demonstration;Exhibits compliance with exercise, home and travel O2 prescription     Goals/Expected Outcomes  compliance   compliance   compliance        Oxygen Discharge (Final Oxygen Re-Evaluation): Oxygen Re-Evaluation - 10/18/18 0944      Program Oxygen Prescription   Program Oxygen Prescription  None      Home Oxygen   Home Oxygen Device  None     Sleep Oxygen Prescription  None    Home Exercise Oxygen Prescription  None    Home at Rest Exercise Oxygen Prescription  None      Goals/Expected Outcomes   Short Term Goals  To learn and exhibit compliance with exercise, home and travel O2 prescription;To learn and understand importance of monitoring SPO2 with pulse oximeter and demonstrate accurate use of the pulse oximeter.;To learn and understand importance of maintaining oxygen saturations>88%;To learn and demonstrate proper use of respiratory medications;To learn and demonstrate proper pursed lip breathing techniques or other breathing techniques.    Long  Term Goals  Compliance with respiratory medication;Exhibits proper breathing techniques, such as pursed lip breathing or other method taught during program session;Maintenance of O2 saturations>88%;Verbalizes importance of monitoring SPO2 with pulse oximeter and return demonstration;Exhibits compliance with exercise, home and travel O2 prescription    Goals/Expected Outcomes  compliance       Initial Exercise Prescription:   Perform Capillary Blood Glucose checks as needed.  Exercise Prescription Changes: Exercise Prescription Changes    Row Name 04/27/18 1200 05/04/18 1600 09/21/18 1200 10/05/18 1100       Response to Exercise   Blood Pressure (Admit)  102/58  112/70  100/66  112/60    Blood Pressure (Exercise)  100/74  126/70  120/70  130/80    Blood Pressure (Exit)  100/66  100/76  112/74  124/86    Heart Rate (Admit)  99 bpm  108 bpm  97 bpm  95 bpm    Heart Rate (Exercise)  126 bpm  124 bpm  90 bpm  90 bpm    Heart Rate (Exit)  87 bpm  106 bpm  95 bpm  95 bpm    Oxygen Saturation (Admit)  96 %  95 %  97 %  107 %    Oxygen Saturation (Exercise)  90 %  90 %  126 %  123 %    Oxygen Saturation (Exit)  95 %  95 %  102 %  109 %    Rating of Perceived Exertion (Exercise)  '13  13  13  13    '$ Perceived Dyspnea (Exercise)  '1  1  2  1    '$ Duration  Progress to 45 minutes of  aerobic exercise without signs/symptoms of physical distress  Progress to 45 minutes of aerobic exercise without signs/symptoms of physical distress  Progress to 45 minutes of aerobic exercise without signs/symptoms of physical distress  Progress to 45 minutes of aerobic exercise without signs/symptoms of physical distress    Intensity  Other (comment) 40-80% of HRR  THRR unchanged  THRR unchanged  THRR unchanged  Progression   Progression  Continue to progress workloads to maintain intensity without signs/symptoms of physical distress.  Continue to progress workloads to maintain intensity without signs/symptoms of physical distress.  Continue to progress workloads to maintain intensity without signs/symptoms of physical distress.  Continue to progress workloads to maintain intensity without signs/symptoms of physical distress.      Resistance Training   Training Prescription  Yes  Yes  Yes  Yes    Weight  orange bands  orange bands  orange bands  orange bands    Reps  10-15  10-15  10-15  10-15    Time  10 Minutes  10 Minutes  10 Minutes  -      Treadmill   MPH  1.6  2  1.8  2    Grade  '2  2  2  2    '$ Minutes  '17  17  15  15      '$ Bike   Level  0.5  0.5  -  -    Minutes  17  17  -  -      NuStep   Level  '3  3  2  3    '$ SPM  80  80  80  80    Minutes  '17  17  15  15    '$ METs  2.1  2  1.9  2.3      Home Exercise Plan   Plans to continue exercise at  [employer] (comment[facility] [employer]  -  -    Frequency  -  Add 3 additional days to program exercise sessions.  -  -    Initial Home Exercises Provided  -  05/04/18  -  -       Exercise Comments: Exercise Comments    Row Name 05/04/18 1608           Exercise Comments  home exercise complete          Exercise Goals and Review:   Exercise Goals Re-Evaluation : Exercise Goals Re-Evaluation    [place] Name 04/27/18 0820 05/17/18 1017 10/18/18 0942         Exercise Goal Re-Evaluation   Exercise Goals Review   Increase Physical Activity;Increase Strength and Stamina;Able to understand and use rate of perceived exertion (RPE) scale;Able to understand and use Dyspnea scale;Knowledge and understanding of Target Heart Rate Range (THRR);Understanding of Exercise Prescription  Increase Physical Activity;Increase Strength and Stamina;Able to understand and use rate of perceived exertion (RPE) scale;Able to understand and use Dyspnea scale;Knowledge and understanding of Target Heart Rate Range (THRR);Understanding of Exercise Prescription  Increase Physical Activity;Increase Strength and Stamina;Able to understand and use rate of perceived exertion (RPE) scale;Able to understand and use Dyspnea scale;Knowledge and understanding of Target Heart Rate Range (THRR);Understanding of Exercise Prescription     Comments  Pt has completed 3 exercise sessions and is motivated to exercise. Pt is currently exercising at 2.87 METs while on the treadmill. I expect the pt to progress well through the program. Will continue to monitor and progress as able.   Pt exercises at 3.1 METs and is motivated to progress through the program. Our department is currently closed and will be for an unknown period of time. Pt has previously been instructed on home exercise. Will call to follow up and give updates to pt later in the week after I recieve them.   Pt has completed 7 exercise sessions since we have reopened.  Pt is able to walk at 3.29 METs while on the treadmill. Pt is accepting of workload increases. Pt has a very positive attitude and is a pleasure to be around. Will continue to monitor and progress as able.     Expected Outcomes  Through exercise at rehab and at home, the patient will decrease shortness of breath with daily activities and feel confident in carrying out an exercise regime at home.   Through exercise at rehab and at home, the patient will decrease shortness of breath with daily activities and feel confident in carrying out an  exercise regime at home.   Through exercise at rehab and at home, the patient will decrease shortness of breath with daily activities and feel confident in carrying out an exercise regime at home.        Discharge Exercise Prescription (Final Exercise Prescription Changes): Exercise Prescription Changes - 10/05/18 1100      Response to Exercise   Blood Pressure (Admit)  112/60    Blood Pressure (Exercise)  130/80    Blood Pressure (Exit)  124/86    Heart Rate (Admit)  95 bpm    Heart Rate (Exercise)  90 bpm    Heart Rate (Exit)  95 bpm    Oxygen Saturation (Admit)  107 %    Oxygen Saturation (Exercise)  123 %    Oxygen Saturation (Exit)  109 %    Rating of Perceived Exertion (Exercise)  13    Perceived Dyspnea (Exercise)  1    Duration  Progress to 45 minutes of aerobic exercise without signs/symptoms of physical distress    Intensity  THRR unchanged      Progression   Progression  Continue to progress workloads to maintain intensity without signs/symptoms of physical distress.      Resistance Training   Training Prescription  Yes    Weight  orange bands    Reps  10-15      Treadmill   MPH  2    Grade  2    Minutes  15      NuStep   Level  3    SPM  80    Minutes  15    METs  2.3       Nutrition:  Target Goals: Understanding of nutrition guidelines, daily intake of sodium '1500mg'$ , cholesterol '200mg'$ , calories 30% from fat and 7% or less from saturated fats, daily to have 5 or more servings of fruits and vegetables.  Biometrics:    Nutrition Therapy Plan and Nutrition Goals:   Nutrition Assessments:   Nutrition Goals Re-Evaluation:   Nutrition Goals Discharge (Final Nutrition Goals Re-Evaluation):   Psychosocial: Target Goals: Acknowledge presence or absence of significant depression and/or stress, maximize coping skills, provide positive support system. Participant is able to verbalize types and ability to use techniques and skills needed for reducing  stress and depression.  Initial Review & Psychosocial Screening:   Quality of Life Scores:  Scores of 19 and below usually indicate a poorer quality of life in these areas.  A difference of  2-3 points is a clinically meaningful difference.  A difference of 2-3 points in the total score of the Quality of Life Index has been associated with significant improvement in overall quality of life, self-image, physical symptoms, and general health in studies assessing change in quality of life.  PHQ-9: Recent Review Flowsheet Data    Depression screen [facility] 2/9 04/05/2018   Decreased Interest 0   Down, Depressed, Hopeless  0   PHQ - 2 Score 0   Altered sleeping 0   Tired, decreased energy 2   Change in appetite 0   Feeling bad or failure about yourself  0   Trouble concentrating 0   Moving slowly or fidgety/restless 0   Suicidal thoughts 0   Difficult doing work/chores Not difficult at all     Interpretation of Total Score  Total Score Depression Severity:  1-4 = Minimal depression, 5-9 = Mild depression, 10-14 = Moderate depression, 15-19 = Moderately severe depression, 20-27 = Severe depression   Psychosocial Evaluation and Intervention: Psychosocial Evaluation - 10/18/18 0905      Psychosocial Evaluation & Interventions   Interventions  Encouraged to exercise with the program and follow exercise prescription;Stress management education;Relaxation education    Comments  Depression is stable with current treatment    Expected Outcomes  Continued stability of depression    Continue Psychosocial Services   Follow up required by staff       Psychosocial Re-Evaluation: Psychosocial Re-Evaluation    [place] Name 04/27/18 0756 04/27/18 0801 05/17/18 1421 10/18/18 0906       Psychosocial Re-Evaluation   Current issues with  Current Depression;History of Depression;Current Psychotropic Meds  Current Depression;History of Depression;Current Psychotropic Meds  Current Depression;History of  Depression;Current Psychotropic Meds  History of Depression;Current Depression    Comments  -  Pt is being seen by a psychiatrist for a history of depression, but states that this helps him. He had been prescribed medications, which has been successful and he fells alot better.   Pt continue to be seen by a psychiatrist for a history of depression, and states that this helps him. He had been prescribed medications, which has been successful and he fells alot better.vRehab is currently closed and will plan to re-evaulate pt progress upon reopening.   Is very happy to return back to pulmonary rehab after COVID-19 closure for 3 months, depression stable    Expected Outcomes  -  Pt will remain under care of PCP and psychiatrist and will report changes to staff. Pt will remain free of barriers to participation under current treatment.  Pt will remain under care of PCP and psychiatrist and will report changes to staff. Pt will remain free of barriers to participation under current treatment.  Continued stability of depression    Interventions  Stress management education;Relaxation education;Encouraged to attend Pulmonary Rehabilitation for the exercise  Stress management education;Relaxation education;Encouraged to attend Pulmonary Rehabilitation for the exercise  Stress management education;Relaxation education;Encouraged to attend Pulmonary Rehabilitation for the exercise  Encouraged to attend Pulmonary Rehabilitation for the exercise;Relaxation education;Stress management education    Continue Psychosocial Services   Follow up required by staff  Follow up required by staff  Follow up required by staff  Follow up required by staff       Psychosocial Discharge (Final Psychosocial Re-Evaluation): Psychosocial Re-Evaluation - 10/18/18 0906      Psychosocial Re-Evaluation   Current issues with  History of Depression;Current Depression    Comments  Is very happy to return back to pulmonary rehab after COVID-19  closure for 3 months, depression stable    Expected Outcomes  Continued stability of depression    Interventions  Encouraged to attend Pulmonary Rehabilitation for the exercise;Relaxation education;Stress management education    Continue Psychosocial Services   Follow up required by staff       Education: Education Goals: Education classes will be provided on a weekly basis, covering required topics.  Participant will state understanding/return demonstration of topics presented.  Learning Barriers/Preferences:   Education Topics: Risk Factor Reduction:  -Group instruction that is supported by a PowerPoint presentation. Instructor discusses the definition of a risk factor, different risk factors for pulmonary disease, and how the heart and lungs work together.     Nutrition for Pulmonary Patient:  -Group instruction provided by PowerPoint slides, verbal discussion, and written materials to support subject matter. The instructor gives an explanation and review of healthy diet recommendations, which includes a discussion on weight management, recommendations for fruit and vegetable consumption, as well as protein, fluid, caffeine, fiber, sodium, sugar, and alcohol. Tips for eating when patients are short of breath are discussed.   Pursed Lip Breathing:  -Group instruction that is supported by demonstration and informational handouts. Instructor discusses the benefits of pursed lip and diaphragmatic breathing and detailed demonstration on how to preform both.     Oxygen Safety:  -Group instruction provided by PowerPoint, verbal discussion, and written material to support subject matter. There is an overview of "What is Oxygen" and "Why do we need it".  Instructor also reviews how to create a safe environment for oxygen use, the importance of using oxygen as prescribed, and the risks of noncompliance. There is a brief discussion on traveling with oxygen and resources the patient may  utilize.   Oxygen Equipment:  -Group instruction provided by [facility] Staff utilizing handouts, written materials, and equipment demonstrations.   Signs and Symptoms:  -Group instruction provided by written material and verbal discussion to support subject matter. Warning signs and symptoms of infection, stroke, and heart attack are reviewed and when to call the physician/911 reinforced. Tips for preventing the spread of infection discussed.   Advanced Directives:  -Group instruction provided by verbal instruction and written material to support subject matter. Instructor reviews Advanced Directive laws and proper instruction for filling out document.   Pulmonary Video:  -Group video education that reviews the importance of medication and oxygen compliance, exercise, good nutrition, pulmonary hygiene, and pursed lip and diaphragmatic breathing for the pulmonary patient.   Exercise for the Pulmonary Patient:  -Group instruction that is supported by a PowerPoint presentation. Instructor discusses benefits of exercise, core components of exercise, frequency, duration, and intensity of an exercise routine, importance of utilizing pulse oximetry during exercise, safety while exercising, and options of places to exercise outside of rehab.     PULMONARY REHAB OTHER RESPIRATORY from 04/15/2018 in [place]  Date  04/15/18  Educator  ep  Instruction Review Code  1- Verbalizes Understanding      Pulmonary Medications:  -Verbally interactive group education provided by instructor with focus on inhaled medications and proper administration.   Anatomy and Physiology of the Respiratory System and Intimacy:  -Group instruction provided by PowerPoint, verbal discussion, and written material to support subject matter. Instructor reviews respiratory cycle and anatomical components of the respiratory system and their functions. Instructor also reviews differences in  obstructive and restrictive respiratory diseases with examples of each. Intimacy, Sex, and Sexuality differences are reviewed with a discussion on how relationships can change when diagnosed with pulmonary disease. Common sexual concerns are reviewed.   MD DAY -A group question and answer session with a medical doctor that allows participants to ask questions that relate to their pulmonary disease state.   OTHER EDUCATION -Group or individual verbal, written, or video instructions that support the educational goals of the pulmonary rehab program.   Holiday Eating Survival  Tips:  -Group instruction provided by PowerPoint slides, verbal discussion, and written materials to support subject matter. The instructor gives patients tips, tricks, and techniques to help them not only survive but enjoy the holidays despite the onslaught of food that accompanies the holidays.   Knowledge Questionnaire Score:   Core Components/Risk Factors/Patient Goals at Admission:   Core Components/Risk Factors/Patient Goals Review:  Goals and Risk Factor Review    Row Name 04/27/18 0805 05/17/18 1422 10/18/18 0909 10/18/18 0915       Core Components/Risk Factors/Patient Goals Review   Personal Goals Review  Develop more efficient breathing techniques such as purse lipped breathing and diaphragmatic breathing and practicing self-pacing with activity.;Increase knowledge of respiratory medications and ability to use respiratory devices properly.;Improve shortness of breath with ADL's  Develop more efficient breathing techniques such as purse lipped breathing and diaphragmatic breathing and practicing self-pacing with activity.;Increase knowledge of respiratory medications and ability to use respiratory devices properly.;Improve shortness of breath with ADL's  Increase knowledge of respiratory medications and ability to use respiratory devices properly.;Stress;Improve shortness of breath with ADL's;Develop more  efficient breathing techniques such as purse lipped breathing and diaphragmatic breathing and practicing self-pacing with activity.  -    Review  Myron is off to a great start in rehab. He has completed 3 exercise sessions and 1 education session. He has already shown improvement in his workloads of an increase to 0.5 on the airdyne, and increase to level 3 on the nustep and maintaining at 1.6/2.0 on the treadmill. Pt also consistently reports mild SOB. Pt is able to demonstrate PLB technique with verbal cues and will hope to become more independent with this as he progresses in rehab. Will continue to monitor for progress at next 30 day review.  He has completed 5 exercise sessions and 1 education session. He has already shown improvement in his workloads with 0.5 on the airdyne, level 3 on the nustep and increasing to 2.0/2.0 on the treadmill. Pt also consistently reports mild SOB. Pt is able to demonstrate PLB technique with verbal cues and will hope to become more independent with this as he progresses in rehab. Rehab is currently closed and will plan to re-evaulate pt progress upon reopening.  Pulmonary rehab reopened after being closed 3 months for COVID-19 precautions.  Patient has completed 12 exercise sessions and is progressing well.  He is exercising on the treadmill at a speed of 2.2 mph and an incline of 2% and level 3 on the nustep with a met level of 2.1.  He is to graduate in 2 weeks.  -    Expected Outcomes  see admission goals and outcomes  see admission goals and outcomes  For patient to continue exercising on his own after graduating from pulmonary rehab.  He wants to exercise at [employer] when the gyms reopen across the state.  see admission goals       Core Components/Risk Factors/Patient Goals at Discharge (Final Review):  Goals and Risk Factor Review - 10/18/18 0915      Core Components/Risk Factors/Patient Goals Review   Expected Outcomes  see admission goals       ITP  Comments: ITP Comments    Row Name 04/27/18 0748 05/17/18 1420         ITP Comments  Dr. Jennet Maduro, Medial Director, Pulmonary rehab  Dr. Jennet Maduro, Medial Director, Pulmonary rehab         Comments: ITP REVIEW Pt is making expected progress toward pulmonary  rehab goals after completing 12 sessions. Recommend continued exercise, life style modification, education, and utilization of breathing techniques to increase stamina and strength and decrease shortness of breath with exertion.

## 2018-10-21 ENCOUNTER — Encounter (HOSPITAL_COMMUNITY)
Admission: RE | Admit: 2018-10-21 | Discharge: 2018-10-21 | Disposition: A | Discharge status: Home / Self Care | Payer: PPO | Source: Ambulatory Visit | Attending: Internal Medicine | Admitting: Internal Medicine

## 2018-10-21 ENCOUNTER — Other Ambulatory Visit: Payer: Self-pay

## 2018-10-21 DIAGNOSIS — J849 Interstitial pulmonary disease, unspecified: Secondary | ICD-10-CM | POA: Diagnosis not present

## 2018-10-21 NOTE — Progress Notes (Signed)
Daily Session Note  Patient Details  Name: Eduardo Fuentes MRN: [id number] Date of Birth: [birth year] Referring Provider:     Pulmonary Rehab Walk Test from 04/06/2018 in [place]  Referring Provider  Dr. Chase Caller      Encounter Date: 10/21/2018  Check In: Session Check In - 10/21/18 1003      Check-In   Supervising physician immediately available to respond to emergencies  [facility] immediately available    Physician(s)  Dr. Benny Lennert    Location  [facility]    Staff Present  Rosebud Poles, RN, Bjorn Loser, MS, Exercise Physiologist;Lochlan Grygiel Ysidro Evert, RN    Fall or balance concerns reported     No    Tobacco Cessation  No Change    Warm-up and Cool-down  Performed as group-led instruction    Resistance Training Performed  Yes    VAD Patient?  No    PAD/SET Patient?  No      Pain Assessment   Currently in Pain?  No/denies    Multiple Pain Sites  No       Capillary Blood Glucose: No results found for this or any previous visit (from the past 24 hour(s)).    Social History   Tobacco Use  Smoking Status Never Smoker  Smokeless Tobacco Never Used    Goals Met:  Exercise tolerated well No report of cardiac concerns or symptoms Strength training completed today  Goals Unmet:  Not Applicable  Comments: Service time is from 0950 to 67    Dr. Rush Farmer is Medical Director for Pulmonary Rehab at [facility].

## 2018-10-26 ENCOUNTER — Encounter (HOSPITAL_COMMUNITY)
Admission: RE | Admit: 2018-10-26 | Discharge: 2018-10-26 | Disposition: A | Discharge status: Home / Self Care | Payer: PPO | Source: Ambulatory Visit | Attending: Internal Medicine | Admitting: Internal Medicine

## 2018-10-26 ENCOUNTER — Other Ambulatory Visit: Payer: Self-pay

## 2018-10-26 DIAGNOSIS — J849 Interstitial pulmonary disease, unspecified: Secondary | ICD-10-CM | POA: Diagnosis not present

## 2018-10-26 NOTE — Progress Notes (Signed)
I have reviewed the pulmonary graduate packet with [name] . Abran is scheduled to graduate on 10/28/2018. The patient was given an exercise prescription and stated that he was going to rejoin [employer] when they reopen. He will walk at home until they open.  Fritz Pickerel and I discussed how to progress their exercise prescription.  The patient stated that they understand the exercise prescription.  We reviewed exercise guidelines, target heart rate during exercise, RPE Scale, weather conditions, NTG use, endpoints for exercise, warmup and cool down.  The patient was also given information regarding the pulmonary maintenance program and given homework to complete and return before graduation. Patient is encouraged to come to me with any questions.

## 2018-10-26 NOTE — Progress Notes (Signed)
Daily Session Note  Patient Details  Name: Eduardo Fuentes MRN: [id number] Date of Birth: [birth year] Referring Provider:     Pulmonary Rehab Walk Test from 04/06/2018 in [name]  Referring Provider  Dr. Chase Caller      Encounter Date: 10/26/2018  Check In: Session Check In - 10/26/18 0945      Check-In   Physician(s)  Dr. Benny Lennert    Location  [facility]    Staff Present  Rosebud Poles, RN, Bjorn Loser, MS, Exercise Physiologist;Carlette Wilber Oliphant, RN, BSN    Virtual Visit  No    Medication changes reported      No    Fall or balance concerns reported     No    Tobacco Cessation  No Change    Warm-up and Cool-down  Performed as group-led instruction    Resistance Training Performed  Yes    VAD Patient?  No    PAD/SET Patient?  No      Pain Assessment   Currently in Pain?  No/denies    Multiple Pain Sites  No       Capillary Blood Glucose: No results found for this or any previous visit (from the past 24 hour(s)).    Social History   Tobacco Use  Smoking Status Never Smoker  Smokeless Tobacco Never Used    Goals Met:  Exercise tolerated well Personal goals reviewed  Goals Unmet:  Not Applicable  Comments: Service time is from 0945 to 75    Dr. Rush Farmer is Medical Director for Pulmonary Rehab at [facility].

## 2018-10-28 ENCOUNTER — Other Ambulatory Visit: Payer: Self-pay

## 2018-10-28 ENCOUNTER — Encounter (HOSPITAL_COMMUNITY)
Admission: RE | Admit: 2018-10-28 | Discharge: 2018-10-28 | Disposition: A | Discharge status: Home / Self Care | Payer: PPO | Source: Ambulatory Visit | Attending: Internal Medicine | Admitting: Internal Medicine

## 2018-10-28 DIAGNOSIS — J849 Interstitial pulmonary disease, unspecified: Secondary | ICD-10-CM | POA: Diagnosis not present

## 2018-10-28 NOTE — Progress Notes (Signed)
Daily Session Note  Patient Details  Name: Eduardo Fuentes MRN: [id number] Date of Birth: 26-Mar-1950 Referring Provider:     Pulmonary Rehab Walk Test from 04/06/2018 in [place]  Referring Provider  Dr. Chase Caller      Encounter Date: 10/28/2018  Check In: Session Check In - 10/28/18 1001      Check-In   Supervising physician immediately available to respond to emergencies  [facility] immediately available    Physician(s)  Dr. Benny Lennert    Location  [facility]    Staff Present  Rosebud Poles, RN, Bjorn Loser, MS, Exercise Physiologist;Lisa Ysidro Evert, RN    Virtual Visit  No    Medication changes reported      No    Fall or balance concerns reported     No    Warm-up and Cool-down  Performed as group-led instruction    Resistance Training Performed  Yes    VAD Patient?  No    PAD/SET Patient?  No      Pain Assessment   Currently in Pain?  No/denies    Multiple Pain Sites  No       Capillary Blood Glucose: No results found for this or any previous visit (from the past 24 hour(s)).    Social History   Tobacco Use  Smoking Status Never Smoker  Smokeless Tobacco Never Used    Goals Met:  Proper associated with RPD/PD & O2 Sat Exercise tolerated well Strength training completed today  Goals Unmet:  Not Applicable  Comments: Service time is from 0945 to 1100    Dr. Rush Farmer is Medical Director for Pulmonary Rehab at [facility].

## 2018-11-02 ENCOUNTER — Encounter (HOSPITAL_COMMUNITY): Payer: PPO

## 2018-11-18 DIAGNOSIS — J84112 Idiopathic pulmonary fibrosis: Secondary | ICD-10-CM | POA: Diagnosis not present

## 2018-11-18 DIAGNOSIS — E7800 Pure hypercholesterolemia, unspecified: Secondary | ICD-10-CM | POA: Diagnosis not present

## 2018-11-18 DIAGNOSIS — Z0001 Encounter for general adult medical examination with abnormal findings: Secondary | ICD-10-CM | POA: Diagnosis not present

## 2018-11-18 DIAGNOSIS — F321 Major depressive disorder, single episode, moderate: Secondary | ICD-10-CM | POA: Diagnosis not present

## 2018-11-18 DIAGNOSIS — R110 Nausea: Secondary | ICD-10-CM | POA: Diagnosis not present

## 2018-11-18 DIAGNOSIS — Z1159 Encounter for screening for other viral diseases: Secondary | ICD-10-CM | POA: Diagnosis not present

## 2018-11-18 DIAGNOSIS — C61 Malignant neoplasm of prostate: Secondary | ICD-10-CM | POA: Diagnosis not present

## 2018-11-18 DIAGNOSIS — Z23 Encounter for immunization: Secondary | ICD-10-CM | POA: Diagnosis not present

## 2018-11-18 DIAGNOSIS — Z79899 Other long term (current) drug therapy: Secondary | ICD-10-CM | POA: Diagnosis not present

## 2018-11-18 DIAGNOSIS — M5432 Sciatica, left side: Secondary | ICD-10-CM | POA: Diagnosis not present

## 2018-11-23 ENCOUNTER — Telehealth: Payer: Self-pay | Admitting: Internal Medicine

## 2018-11-23 NOTE — Telephone Encounter (Signed)
Pt states that he has been experiencing nausea and diarrhea. He would like some advise.

## 2018-11-23 NOTE — Telephone Encounter (Signed)
Pt states that for the past 2 months he has been having issues with nausea. Reports that he wakes up at night sometimes nauseated and also some mornings. Sometimes he may be just watching TV and he will have some nausea, states he has not actually vomited. Pt scheduled to see Dr. Henrene Pastor Friday 11/26/18@8 :20am. Pt aware of appt.

## 2018-11-26 ENCOUNTER — Ambulatory Visit: Payer: PPO | Admitting: Internal Medicine

## 2018-12-07 NOTE — Progress Notes (Signed)
Discharge Progress Report  Patient Details  Name: Eduardo Fuentes MRN: [id number] Date of Birth: November 10, 1950 Referring Provider:     Pulmonary Rehab Walk Test from 04/06/2018 in [place]  Referring Provider  Dr. Chase Caller       Number of Visits: 16 Reason for Discharge:  Patient reached a stable level of exercise. Patient independent in their exercise. Patient has met program and personal goals.  Smoking History:  Social History   Tobacco Use  Smoking Status Never Smoker  Smokeless Tobacco Never Used    Diagnosis:  Interstitial lung disease ([place])  ADL [facility]SD: Pulmonary Assessment Scores    Row Name 10/28/18 1203 10/28/18 1507       ADL [facility]SD   ADL Phase  Exit  Exit    SOB Score total  -  32      CAT Score   CAT Score  -  10      mMRC Score   mMRC Score  1  -       Initial Exercise Prescription:   Discharge Exercise Prescription (Final Exercise Prescription Changes): Exercise Prescription Changes - 10/19/18 1200      Response to Exercise   Blood Pressure (Admit)  110/80    Blood Pressure (Exercise)  148/78    Blood Pressure (Exit)  114/76    Heart Rate (Admit)  103 bpm    Heart Rate (Exercise)  132 bpm    Heart Rate (Exit)  106 bpm    Oxygen Saturation (Admit)  94 %    Oxygen Saturation (Exercise)  89 %    Oxygen Saturation (Exit)  95 %    Rating of Perceived Exertion (Exercise)  13    Perceived Dyspnea (Exercise)  1    Duration  Continue with 30 min of aerobic exercise without signs/symptoms of physical distress.    Intensity  THRR unchanged      Progression   Progression  Continue to progress workloads to maintain intensity without signs/symptoms of physical distress.      Resistance Training   Training Prescription  Yes    Weight  orange bands    Reps  10-15    Time  10 Minutes      Treadmill   MPH  2.2    Grade  2    Minutes  15      NuStep   Level  4    SPM  80    Minutes  15    METs  2.1        Functional Capacity: 6 Minute Walk    Row Name 10/28/18 1203         6 Minute Walk   Phase  Discharge     Distance  1410 feet     Distance Feet Change  181 ft     Walk Time  6 minutes     # of Rest Breaks  0     MPH  2.67     METS  3.07     RPE  13     Perceived Dyspnea   1     VO2 Peak  11.2     Symptoms  No     Resting HR  80 bpm     Resting BP  100/60     Resting Oxygen Saturation   96 %     Exercise Oxygen Saturation  during 6 min walk  90 %     Max Ex. HR  109 bpm     Max Ex. BP  118/70     2 Minute Post BP  102/70       Interval HR   1 Minute HR  104     2 Minute HR  102     3 Minute HR  99     4 Minute HR  102     5 Minute HR  109     6 Minute HR  98     2 Minute Post HR  94       Interval Oxygen   Baseline Oxygen Saturation %  96 %     1 Minute Oxygen Saturation %  93 %     1 Minute Liters of Oxygen  0 L     2 Minute Oxygen Saturation %  91 %     2 Minute Liters of Oxygen  0 L     3 Minute Oxygen Saturation %  91 %     3 Minute Liters of Oxygen  0 L     4 Minute Oxygen Saturation %  91 %     4 Minute Liters of Oxygen  0 L     5 Minute Oxygen Saturation %  90 %     5 Minute Liters of Oxygen  0 L     6 Minute Oxygen Saturation %  90 %     6 Minute Liters of Oxygen  0 L     2 Minute Post Oxygen Saturation %  95 %     2 Minute Post Liters of Oxygen  0 L        Psychological, QOL, Others - Outcomes: PHQ 2/9: Depression screen PHQ 2/9 04/05/2018  Decreased Interest 0  Down, Depressed, Hopeless 0  PHQ - 2 Score 0  Altered sleeping 0  Tired, decreased energy 2  Change in appetite 0  Feeling bad or failure about yourself  0  Trouble concentrating 0  Moving slowly or fidgety/restless 0  Suicidal thoughts 0  Difficult doing work/chores Not difficult at all    Quality of Life:   Personal Goals: Goals established at orientation with interventions provided to work toward goal.    Personal Goals Discharge: Goals and Risk Factor Review     Row Name 10/18/18 0909 10/18/18 0915           Core Components/Risk Factors/Patient Goals Review   Personal Goals Review  Increase knowledge of respiratory medications and ability to use respiratory devices properly.;Stress;Improve shortness of breath with ADL's;Develop more efficient breathing techniques such as purse lipped breathing and diaphragmatic breathing and practicing self-pacing with activity.  -      Review  Pulmonary rehab reopened after being closed 3 months for COVID-19 precautions.  Patient has completed 12 exercise sessions and is progressing well.  He is exercising on the treadmill at a speed of 2.2 mph and an incline of 2% and level 3 on the nustep with a met level of 2.1.  He is to graduate in 2 weeks.  -      Expected Outcomes  For patient to continue exercising on his own after graduating from pulmonary rehab.  He wants to exercise at [employer] when the gyms reopen across the state.  see admission goals         Exercise Goals and Review:   Exercise Goals Re-Evaluation: Exercise Goals Re-Evaluation    Row Name 10/18/18 [phone]  Exercise Goal Re-Evaluation   Exercise Goals Review  Increase Physical Activity;Increase Strength and Stamina;Able to understand and use rate of perceived exertion (RPE) scale;Able to understand and use Dyspnea scale;Knowledge and understanding of Target Heart Rate Range (THRR);Understanding of Exercise Prescription       Comments  Pt has completed 7 exercise sessions since we have reopened. Pt is able to walk at 3.29 METs while on the treadmill. Pt is accepting of workload increases. Pt has a very positive attitude and is a pleasure to be around. Will continue to monitor and progress as able.       Expected Outcomes  Through exercise at rehab and at home, the patient will decrease shortness of breath with daily activities and feel confident in carrying out an exercise regime at home.          Nutrition & Weight -  Outcomes:    Nutrition:   Nutrition Discharge:   Education Questionnaire Score: Knowledge Questionnaire Score - 10/28/18 1508      Knowledge Questionnaire Score   Post Score  15/18       Goals reviewed with patient; copy given to patient.

## 2018-12-07 NOTE — Addendum Note (Signed)
Encounter addended by: Lance Morin, RN on: 12/07/2018 4:33 PM  Actions taken: Clinical Note Signed, Episode resolved

## 2018-12-08 ENCOUNTER — Other Ambulatory Visit: Payer: Self-pay

## 2018-12-08 ENCOUNTER — Ambulatory Visit (INDEPENDENT_AMBULATORY_CARE_PROVIDER_SITE_OTHER)
Admission: RE | Admit: 2018-12-08 | Discharge: 2018-12-08 | Disposition: A | Discharge status: Home / Self Care | Payer: PPO | Source: Ambulatory Visit | Attending: Pulmonary Disease | Admitting: Pulmonary Disease

## 2018-12-08 DIAGNOSIS — J84112 Idiopathic pulmonary fibrosis: Secondary | ICD-10-CM | POA: Diagnosis not present

## 2018-12-08 DIAGNOSIS — J849 Interstitial pulmonary disease, unspecified: Secondary | ICD-10-CM | POA: Diagnosis not present

## 2018-12-08 NOTE — Progress Notes (Signed)
High-resolution CT chest results have come back.  Showing stability.  No significant progression from December/2019 high-resolution CT chest.  This is good news.  Keep planned follow-up with our office.  Wyn Quaker, FNP

## 2018-12-14 ENCOUNTER — Ambulatory Visit: Payer: PPO | Admitting: Internal Medicine

## 2018-12-24 DIAGNOSIS — G5790 Unspecified mononeuropathy of unspecified lower limb: Secondary | ICD-10-CM | POA: Diagnosis not present

## 2018-12-24 DIAGNOSIS — Z5181 Encounter for therapeutic drug level monitoring: Secondary | ICD-10-CM | POA: Diagnosis not present

## 2018-12-24 DIAGNOSIS — Z79899 Other long term (current) drug therapy: Secondary | ICD-10-CM | POA: Diagnosis not present

## 2019-01-03 ENCOUNTER — Telehealth: Payer: Self-pay | Admitting: Internal Medicine

## 2019-01-03 NOTE — Telephone Encounter (Signed)
Pt aware.

## 2019-01-03 NOTE — Telephone Encounter (Signed)
See note below and advise. 

## 2019-01-03 NOTE — Telephone Encounter (Signed)
Not sure

## 2019-01-03 NOTE — Telephone Encounter (Signed)
Pt inquired whether there is a correlation between diet drinks and pancreatic cancer/issues.  He said that the host of [employer] died from pancreatic cancer and the news associated it with diet drinks.  Please advise.

## 2019-01-05 DIAGNOSIS — G5790 Unspecified mononeuropathy of unspecified lower limb: Secondary | ICD-10-CM | POA: Diagnosis not present

## 2019-01-13 ENCOUNTER — Telehealth: Payer: Self-pay | Admitting: Internal Medicine

## 2019-01-13 NOTE — Telephone Encounter (Signed)
Received a fax from the [facility] stating that the patient has been approved for patient assistance for his [place]. His grant amount is $9,000. His enrollment period is from 01/25/2019 to 01/24/2020.

## 2019-01-14 ENCOUNTER — Other Ambulatory Visit (HOSPITAL_COMMUNITY)
Admission: RE | Admit: 2019-01-14 | Discharge: 2019-01-14 | Disposition: A | Discharge status: Home / Self Care | Payer: PPO | Source: Ambulatory Visit | Attending: Pulmonary Disease | Admitting: Pulmonary Disease

## 2019-01-14 DIAGNOSIS — Z01812 Encounter for preprocedural laboratory examination: Secondary | ICD-10-CM | POA: Insufficient documentation

## 2019-01-14 DIAGNOSIS — Z20828 Contact with and (suspected) exposure to other viral communicable diseases: Secondary | ICD-10-CM | POA: Diagnosis not present

## 2019-01-14 LAB — SARS CORONAVIRUS 2 (TAT 6-24 HRS): SARS Coronavirus 2: NEGATIVE

## 2019-01-16 NOTE — Progress Notes (Signed)
@Patient  ID: Eduardo Fuentes, male    DOB: 10-18-1950, 68 y.o.   MRN: [id number]  Chief Complaint  Patient presents with  . Follow-up    States his breathing has been stable since last visit. F/U on PFT results.     Referring provider: Lujean Amel, MD  HPI:  68 year old male patient followed in our office for interstitial lung disease and IPF [clinical diagnosis based on classic UIP scan negative serology and age and male gender; diagnosis given February 11, 2017 and on Ofev since 02/27/2018  PMH: GERD Smoker/ Smoking History: Never Smoker  Maintenance: OFEV Pt of: Dr. Chase Caller  01/17/2019  - Visit   68 year old male never smoker followed in our office for IPF.  Patient presented to our office today after completing pulmonary function testing.  Pulmonary function testing results listed below:  01/17/2019-spirometry with DLCO-FVC 2.82 (65% predicted), ratio 85, FEV1 2.4 (75% predicted), DLCO 14.84 (58% predicted)  Overall patient has felt the symptoms are stable. Pt reports he completed his yardwork this weekend and doing well.  Patient reports that he puts mowed his grass as well as E flower.  He felt short of breath but is happy that he was able to coordinate this.  He did recently pay $160 for his November prescription of Ofev.  He reports that he was told that the gradient had run out.  He was concerned initially because it was going to be $500 a month for him to receive his dosing in December.  He reports that he has been in contact with the pharmacy team where he receives this and they have requalified him for the grant.  Questionaires / Pulmonary Flowsheets:   MMRC: mMRC Dyspnea Scale mMRC Score  01/17/2019 0    Tests:   02/11/2018-pulmonary function test- FVC 2.90 (67% predicted), ratio 70, postbronchodilator FEV1 1.77 (55% predicted), no bronchodilator response  08/25/2018-pulmonary function test-FVC 2.69 (62% predicted), ratio 73, FEV1 1.95 (61% predicted), DLCO 57   01/17/2019-spirometry with DLCO-FVC 2.82 (65% predicted), ratio 85, FEV1 2.4 (75% predicted), DLCO 14.84 (58% predicted)  01/20/2018-CT chest high-res-spectrum of findings compatible with basilar predominant fibrotic interstitial lung disease with mild honeycombing these findings are new compared to November 2014 chest radiograph.  Findings are consistent with UIP.   01/29/2018- hypersensitivity pneumonitis, CK, aldolase, CCP, Rh, ANA, ANA titer-trace positive ANA but otherwise autoimmune labs normal  01/29/2018- integrative comprehensive ILD questionnaire [place] Integrated Comprehensive ILD Questionnaire  Symptoms: He reports shortness of breath onset gradually for the last 1 year.  It is getting worse with time.  He does have episodic dyspnea.  When he does shopping it is level 1.  When he walks on a level ground with others of his age at this level 1 walking up stairs at this level 1 but walking at his own pace at this level 2 and taking a shower at level 3.  He does have associated arthralgia and cough.  He is not sure when the cough started.  It is present at all positions since it started it is the same as moderate in intensity.  He does cough at night.  He does bring up phlegm which is clear in color.  It gets worse when he lies down and it affects his voice and he does clear his throat.  He does not feel a tickle.  There is no wheezing.  Past Medical History : This is positive for major depression but he circled no for asthma, COPD.  Surgical no for  heart failure.  Surgical no for collagen vascular disease with sleep apnea.  He did not answer for acid reflux which he does have..  Denies diabetes or thyroid disease or heart disease or pulmonary embolism or pneumonia  ROS:  .  Positive for difficulty swallowing due to his esophageal stricture history.  Does have nausea and acid reflux and snoring.  Otherwise no vomiting is positive for soreness across the body after electroconvulsive therapy 2  days ago.   FAMILY HISTORY of LUNG DISEASE: Denies family history of any pulmonary fibrosis COPD or asthma sarcoidosis  EXPOSURE HISTORY: Denies smoking cigarettes or tobacco or marijuana or cocaine.  No intravenous drug abuse.  HOME and HOBBY DETAILS : He did not list the age of the home but is lived in this home for [place].  There is no mold in the house of mildew.  The house is not damp.  Does not use a CPAP mask.  No nebulizer machine.  No use of Jacuzzi.  Nose fountain in the house.  No pet birds or gerbils.  Does not use feather pillows.  He is not sure if there is mold in the [facility] duct system.  Does not play wind instruments.  He occasionally mows the lawn  OCCUPATIONAL HISTORY (122 questions) : Positive for social gardening.  Did report that he is used feather pillows and blankets in the past but otherwise negative including working in a dusty environment  PULMONARY TOXICITY HISTORY (27 items): Positive for cancer chemotherapy but he does not know the details  FENO:  No results found for: NITRICOXIDE  PFT: PFT Results Latest Ref Rng & Units 01/17/2019 08/25/2018 02/11/2018  FVC-Pre L 2.82 2.69 2.90  FVC-Predicted Pre % 65 62 67  FVC-Post L - - 2.52  FVC-Predicted Post % - - 58  Pre FEV1/FVC % % 85 73 86  Post FEV1/FCV % % - - 70  FEV1-Pre L 2.40 1.95 2.50  FEV1-Predicted Pre % 75 61 78  FEV1-Post L - - 1.77  DLCO UNC% % 58 57 -  DLCO COR %Predicted % 88 97 -  TLC L - - 3.41  TLC % Predicted % - - 50  RV % Predicted % - - 11    WALK:  SIX MIN WALK 01/17/2019 08/30/2018 04/29/2018 03/16/2018 02/09/2018 01/29/2018  Supplimental Oxygen during Test? (L/min) No No No No - No  Tech Comments: Patient was able to complete 2 laps without stopping. Denied any SOB, CP or leg pain. No O2 needed during or after walk. patient started with O2 sat of 97 on room air, and heart rate of 102 tolerated walk well, ending o2 sat at 96 with a heart rate of 100. Pt walked at a  normal pace completing all required laps having complaints of mild SOB. Pt walked at a steady pace completing all required laps having complaints of mild SOB. Pt walked at moderate pace tolerated well (3 laps). Denies lightheadedness, and dizziness, but slight SOB. Pt stated that he could have walked more laps.  steady walk, moderate pace  TA/CMA    Imaging: No results found.  Lab Results:  CBC    Component Value Date/Time   WBC 8.0 01/15/2018 1323   RBC 5.00 01/15/2018 1323   HGB 15.2 01/15/2018 1323   HCT 46.1 01/15/2018 1323   PLT 306 01/15/2018 1323   MCV 92.2 01/15/2018 1323   MCH 30.4 01/15/2018 1323   MCHC 33.0 01/15/2018 1323   RDW  14.0 01/15/2018 1323   LYMPHSABS 0.9 01/26/2013 1102   MONOABS 0.2 01/26/2013 1102   EOSABS 0.1 01/26/2013 1102   BASOSABS 0.0 01/26/2013 1102    BMET    Component Value Date/Time   NA 137 01/15/2018 1323   K 4.5 01/15/2018 1323   CL 102 01/15/2018 1323   CO2 28 01/15/2018 1323   GLUCOSE 90 01/15/2018 1323   BUN 16 01/15/2018 1323   CREATININE 1.18 01/15/2018 1323   CALCIUM 9.0 01/15/2018 1323   GFRNONAA >60 01/15/2018 1323   GFRAA >60 01/15/2018 1323    BNP No results found for: BNP  ProBNP No results found for: PROBNP  Specialty Problems      Pulmonary Problems   IPF (idiopathic pulmonary fibrosis) ([place])    01/20/2018-CT chest high-res-spectrum of findings compatible with basilar predominant fibrotic interstitial lung disease with mild honeycombing these findings are new compared to November 2014 chest radiograph.  Findings are consistent with UIP.   01/29/2018- hypersensitivity pneumonitis, CK, aldolase, CCP, Rh, ANA, ANA titer-trace positive ANA but otherwise autoimmune labs normal  01/29/2018- integrative comprehensive ILD questionnaire [place] Integrated Comprehensive ILD Questionnaire  Symptoms: He reports shortness of breath onset gradually for the last 1 year.  It is getting worse with time.  He does have episodic  dyspnea.  When he does shopping it is level 1.  When he walks on a level ground with others of his age at this level 1 walking up stairs at this level 1 but walking at his own pace at this level 2 and taking a shower at level 3.  He does have associated arthralgia and cough.  He is not sure when the cough started.  It is present at all positions since it started it is the same as moderate in intensity.  He does cough at night.  He does bring up phlegm which is clear in color.  It gets worse when he lies down and it affects his voice and he does clear his throat.  He does not feel a tickle.  There is no wheezing.   Past Medical History : This is positive for major depression but he circled no for asthma, COPD.  Surgical no for heart failure.  Surgical no for collagen vascular disease with sleep apnea.  He did not answer for acid reflux which he does have..  Denies diabetes or thyroid disease or heart disease or pulmonary embolism or pneumonia   ROS:  .  Positive for difficulty swallowing due to his esophageal stricture history.  Does have nausea and acid reflux and snoring.  Otherwise no vomiting is positive for soreness across the body after electroconvulsive therapy 2 days ago.   FAMILY HISTORY of LUNG DISEASE: Denies family history of any pulmonary fibrosis COPD or asthma sarcoidosis   EXPOSURE HISTORY: Denies smoking cigarettes or tobacco or marijuana or cocaine.  No intravenous drug abuse.   HOME and HOBBY DETAILS : He did not list the age of the home but is lived in this home for 25 years [place] an [place] setting.  There is no mold in the house of mildew.  The house is not damp.  Does not use a CPAP mask.  No nebulizer machine.  No use of Jacuzzi.  Nose fountain in the house.  No pet birds or gerbils.  Does not use feather pillows.  He is not sure if there is mold in the [facility] duct system.  Does not play wind instruments.  He occasionally mows the lawn  OCCUPATIONAL HISTORY (122  questions) : Positive for social gardening.  Did report that he is used feather pillows and blankets in the past but otherwise negative including working in a dusty environment   PULMONARY TOXICITY HISTORY (27 items): Positive for cancer chemotherapy but he does not know the details  02/11/2018- pulmonary function test.  FVC 2.9 (67% predicted), postbronchodilator ratio 70, FEV1 55, patient had difficulty completing pulmonary function test due to repeated coughing  08/25/2018-pulmonary function test-FVC 2.69 (62% predicted), ratio 73, FEV1 1.95 (61% predicted), DLCO 57          Allergies  Allergen Reactions  . Aleve [Naproxen Sodium] Anaphylaxis and Other (See Comments)    GI UPSET Patient does not remember anaphylaxis  . Hyzaar [Losartan Potassium-Hctz] Other (See Comments)    Felt bad   . Lisinopril Cough  . Demerol [Meperidine] Rash         Immunization History  Administered Date(s) Administered  . Influenza Inj Mdck Quad Pf 11/09/2017  . Pneumococcal Conjugate-13 10/26/2015  . Pneumococcal Polysaccharide-23 10/29/2016  . Zoster Recombinat (Shingrix) 12/03/2018    Past Medical History:  Diagnosis Date  . Arthritis    NECK  . Cough    NON-PRODUCTIVE  . Depressive disorder    follow by dr [facility] psychiat[facility]  . Diverticulosis   . Diverticulosis of colon   . Generalized anxiety disorder   . GERD (gastroesophageal reflux disease)    WATCHES DIET  . Hemorrhoid   . High blood pressure   . History of colon polyps   . History of kidney stones   . Microscopic hematuria   . Nocturia   . Panic attacks   . Prostate cancer ([place])   . Sinus infection   . Tinnitus    LEFT EAR -- CHRONIC    Tobacco History: Social History   Tobacco Use  Smoking Status Never Smoker  Smokeless Tobacco Never Used   Counseling given: Yes   Continue to not smoke  Outpatient Encounter Medications as of 01/17/2019  Medication Sig  . benzonatate (TESSALON) 200 MG capsule Take 1  capsule (200 mg total) by mouth 3 (three) times daily as needed for cough.  . clonazePAM (KLONOPIN) 1 MG tablet Take 1 tablet at 8 am, 1/2 tablet at 2 pm and 1 tablet at bedtime for anxiety. (Patient taking differently: Take 1 mg by mouth 3 (three) times daily. Pt states "I usually take 1 tablet three times a day")  . dimenhyDRINATE (DRAMAMINE PO) Take by mouth.  . gabapentin (NEURONTIN) 300 MG capsule Take 300 mg by mouth once.  . hydrochlorothiazide (MICROZIDE) 12.5 MG capsule Take 12.5 mg by mouth daily.  . mirtazapine (REMERON) 30 MG tablet TK 1 T PO QHS  . NEOMYCIN-POLYMYXIN-HYDROCORTISONE (CORTISPORIN) 1 % SOLN OTIC solution Apply to bed after soaking  . nortriptyline (PAMELOR) 50 MG capsule Take 25 mg by mouth daily at 8 pm.   . OFEV 150 MG CAPS Take 150 mg by mouth 2 (two) times daily.   . ondansetron (ZOFRAN) 4 MG tablet Take 1 tablet (4 mg total) by mouth every 8 (eight) hours as needed for nausea or vomiting.  . pantoprazole (PROTONIX) 40 MG tablet Take 1 tablet (40 mg total) by mouth daily.   No facility-administered encounter medications on file as of 01/17/2019.      Review of Systems  Review of Systems  Constitutional: Positive for fatigue. Negative for activity change, chills, fever and unexpected weight change.  HENT: Negative for postnasal drip, rhinorrhea,  sinus pressure, sinus pain and sore throat.   Eyes: Negative.   Respiratory: Negative for cough, shortness of breath and wheezing.   Cardiovascular: Negative for chest pain and palpitations.  Gastrointestinal: Negative for constipation, diarrhea, nausea and vomiting.  Endocrine: Negative.   Genitourinary: Negative.   Musculoskeletal: Negative.   Skin: Negative.   Neurological: Negative for dizziness and headaches.  Psychiatric/Behavioral: Negative.  Negative for dysphoric mood. The patient is not nervous/anxious.   All other systems reviewed and are negative.    Physical Exam  BP 110/74   Pulse 95   Temp (!)  97 F (36.1 C) (Temporal)   Ht 5\' 9"  (1.753 m)   Wt 179 lb 12.8 oz (81.6 kg)   SpO2 98% Comment: on RA  BMI 26.55 kg/m   Wt Readings from Last 5 Encounters:  01/17/19 179 lb 12.8 oz (81.6 kg)  10/19/18 186 lb 1.1 oz (84.4 kg)  10/12/18 188 lb 7.9 oz (85.5 kg)  10/05/18 190 lb 0.6 oz (86.2 kg)  09/01/18 185 lb (83.9 kg)    BMI Readings from Last 5 Encounters:  01/17/19 26.55 kg/m  10/19/18 27.48 kg/m  10/12/18 27.84 kg/m  10/05/18 28.06 kg/m  09/01/18 27.32 kg/m    Physical Exam Vitals signs and nursing note reviewed.  Constitutional:      General: He is not in acute distress.    Appearance: Normal appearance. He is obese.  HENT:     Head: Normocephalic and atraumatic.     Right Ear: Hearing, tympanic membrane, ear canal and external ear normal. There is no impacted cerumen.     Left Ear: Hearing, tympanic membrane, ear canal and external ear normal. There is no impacted cerumen.     Nose: Rhinorrhea present. No mucosal edema.     Right Turbinates: Not enlarged.     Left Turbinates: Not enlarged.     Mouth/Throat:     Mouth: Mucous membranes are dry.     Pharynx: Oropharynx is clear. No oropharyngeal exudate.  Eyes:     Pupils: Pupils are equal, round, and reactive to light.  Neck:     Musculoskeletal: Normal range of motion.  Cardiovascular:     Rate and Rhythm: Normal rate and regular rhythm.     Pulses: Normal pulses.     Heart sounds: Normal heart sounds. No murmur.  Pulmonary:     Effort: Pulmonary effort is normal.     Breath sounds: Rales (BB crackles, R > L) present. No decreased breath sounds or wheezing.  Musculoskeletal:     Right lower leg: No edema.     Left lower leg: No edema.  Lymphadenopathy:     Cervical: No cervical adenopathy.  Skin:    General: Skin is warm and dry.     Capillary Refill: Capillary refill takes less than 2 seconds.     Findings: No erythema or rash.  Neurological:     General: No focal deficit present.     Mental  Status: He is alert and oriented to person, place, and time.     Motor: No weakness.     Coordination: Coordination normal.     Gait: Gait is intact. Gait normal.  Psychiatric:        Mood and Affect: Mood normal.        Behavior: Behavior normal. Behavior is cooperative.        Thought Content: Thought content normal.        Judgment: Judgment normal.  Assessment & Plan:   IPF (idiopathic pulmonary fibrosis) (HCC) Plan: Spirometry with DLCO in 6 months Can consider repeating high-resolution CT chest sometime in 2021 Continue to increase physical activity Continue pulmonary rehab after COVID-19 restrictions are lifted Continue OFEV Follow-up in 6 months with spirometry and DLCO I have also personally contacted the Ofev nurse educator to review dietary instructions with the patient as well as to help educate on home exercise options  Counseled about COVID-19 virus infection Plan: Agree with patient's decision to not attend family Thanksgiving Emphasized need for patient to continue to isolate at home Keep social gatherings to less than 10 people Wear a mask Continue to socially distance If you start having acute or worsening symptoms please contact our office or obtain outpatient Covid testing    Return in about 6 months (around 07/17/2019), or if symptoms worsen or fail to improve, for Follow up with Dr. Purnell Shoemaker, Follow up for PFT.   Lauraine Rinne, NP 01/17/2019   This appointment was 32 minutes long with over 50% of the time in direct face-to-face patient care, assessment, plan of care, and follow-up.

## 2019-01-16 NOTE — Progress Notes (Signed)
See above

## 2019-01-16 NOTE — Progress Notes (Signed)
SARS-CoV-2 test is negative.  This is good news.  Proceed forward with work-up as planned.  Johnnae Impastato, FNP 

## 2019-01-17 ENCOUNTER — Other Ambulatory Visit: Payer: Self-pay

## 2019-01-17 ENCOUNTER — Ambulatory Visit (INDEPENDENT_AMBULATORY_CARE_PROVIDER_SITE_OTHER): Payer: PPO | Admitting: Pulmonary Disease

## 2019-01-17 ENCOUNTER — Encounter: Payer: Self-pay | Admitting: Pulmonary Disease

## 2019-01-17 ENCOUNTER — Ambulatory Visit (INDEPENDENT_AMBULATORY_CARE_PROVIDER_SITE_OTHER): Payer: PPO | Admitting: Internal Medicine

## 2019-01-17 VITALS — BP 110/74 | HR 95 | Temp 97.0°F | Ht 69.0 in | Wt 179.8 lb

## 2019-01-17 DIAGNOSIS — Z5181 Encounter for therapeutic drug level monitoring: Secondary | ICD-10-CM | POA: Diagnosis not present

## 2019-01-17 DIAGNOSIS — J84112 Idiopathic pulmonary fibrosis: Secondary | ICD-10-CM | POA: Diagnosis not present

## 2019-01-17 DIAGNOSIS — Z7189 Other specified counseling: Secondary | ICD-10-CM | POA: Diagnosis not present

## 2019-01-17 LAB — PULMONARY FUNCTION TEST
DL/VA % pred: 88 %
DL/VA: 3.61 ml/min/mmHg/L
DLCO unc % pred: 58 %
DLCO unc: 14.84 ml/min/mmHg
FEF 25-75 Pre: 2.69 L/sec
FEF2575-%Pred-Pre: 110 %
FEV1-%Pred-Pre: 75 %
FEV1-Pre: 2.4 L
FEV1FVC-%Pred-Pre: 115 %
FEV6-%Pred-Pre: 69 %
FEV6-Pre: 2.82 L
FEV6FVC-%Pred-Pre: 105 %
FVC-%Pred-Pre: 65 %
FVC-Pre: 2.82 L
Pre FEV1/FVC ratio: 85 %
Pre FEV6/FVC Ratio: 100 %

## 2019-01-17 LAB — HEPATIC FUNCTION PANEL
ALT: 21 U/L (ref 0–53)
AST: 27 U/L (ref 0–37)
Albumin: 4 g/dL (ref 3.5–5.2)
Alkaline Phosphatase: 88 U/L (ref 39–117)
Bilirubin, Direct: 0.1 mg/dL (ref 0.0–0.3)
Total Bilirubin: 0.5 mg/dL (ref 0.2–1.2)
Total Protein: 7.4 g/dL (ref 6.0–8.3)

## 2019-01-17 NOTE — Progress Notes (Signed)
Lab work stable. Continue Cordova FNP

## 2019-01-17 NOTE — Assessment & Plan Note (Signed)
Plan: Spirometry with DLCO in 6 months Can consider repeating high-resolution CT chest sometime in 2021 Continue to increase physical activity Continue pulmonary rehab after COVID-19 restrictions are lifted Continue OFEV Follow-up in 6 months with spirometry and DLCO I have also personally contacted the Ofev nurse educator to review dietary instructions with the patient as well as to help educate on home exercise options

## 2019-01-17 NOTE — Patient Instructions (Addendum)
You were seen today by Lauraine Rinne, NP  for:   1. IPF (idiopathic pulmonary fibrosis) (HCC)  - Pulmonary function test; Future  Walk today in office   Lab work today  Follow-up in May/2021 with Dr. Chase Caller with a spirometry with DLCO breathing test  Eduardo Fuentes Patient clinical nurse educator OFEV [phone]   2. Counseled about COVID-19 virus infection  Agree and support your decision to not attend family Thanksgiving  Limit overall exposure, wear a mask when around others Socially distance between 6 to 8 feet    We recommend today:  Orders Placed This Encounter  Procedures  . Hepatic function panel    Standing Status:   Future    Number of Occurrences:   1    Standing Expiration Date:   01/17/2020  . Pulmonary function test    Standing Status:   Future    Standing Expiration Date:   01/17/2020    Scheduling Instructions:     Wendee Copp in may/2021    Order Specific Question:   Where should this test be performed?    Answer:   [facility]    Order Specific Question:   Full PFT: includes the following: basic spirometry, spirometry pre & post bronchodilator, diffusion capacity (DLCO), lung volumes    Answer:   Full PFT   Orders Placed This Encounter  Procedures  . Hepatic function panel  . Pulmonary function test   No orders of the defined types were placed in this encounter.   Follow Up:    Return in about 6 months (around 07/17/2019), or if symptoms worsen or fail to improve, for Follow up with Dr. Purnell Shoemaker, Follow up for PFT.   Please do your part to reduce the spread of COVID-19:      Reduce your risk of any infection  and COVID19 by using the similar precautions used for avoiding the common cold or flu:  Marland Kitchen Wash your hands often with soap and warm water for at least 20 seconds.  If soap and water are not readily available, use an alcohol-based hand sanitizer with at least 60% alcohol.  . If coughing or sneezing, cover your mouth and  nose by coughing or sneezing into the elbow areas of your shirt or coat, into a tissue or into your sleeve (not your hands). Langley Gauss A MASK when in public  . Avoid shaking hands with others and consider head nods or verbal greetings only. . Avoid touching your eyes, nose, or mouth with unwashed hands.  . Avoid close contact with people who are sick. . Avoid places or events with large numbers of people in one location, like concerts or sporting events. . If you have some symptoms but not all symptoms, continue to monitor at home and seek medical attention if your symptoms worsen. . If you are having a medical emergency, call 911.   [place] / e-Visit: [email or web address]         [facility]: [facility]: [id number]                   [facility]: [id number]     It is flu season:   >>> Best ways to protect herself from the flu: Receive the yearly flu vaccine, practice good hand hygiene washing with soap and also using hand sanitizer when available, eat a nutritious meals, get adequate rest, hydrate appropriately   Please  contact the office if your symptoms worsen or you have concerns that you are not improving.   Thank you for choosing [facility] for your healthcare, and for allowing [place] to partner with you on your healthcare journey. I am thankful to be able to provide care to you today.   Wyn Quaker FNP-C

## 2019-01-17 NOTE — Progress Notes (Signed)
Discussed results with patient in office.  Nothing further is needed at this time.  Delainie Chavana FNP  

## 2019-01-17 NOTE — Progress Notes (Signed)
Spirometry and Dlco done today. 

## 2019-01-17 NOTE — Assessment & Plan Note (Signed)
Plan: Agree with patient's decision to not attend family Thanksgiving Emphasized need for patient to continue to isolate at home Keep social gatherings to less than 10 people Wear a mask Continue to socially distance If you start having acute or worsening symptoms please contact our office or obtain outpatient Covid testing

## 2019-02-02 ENCOUNTER — Ambulatory Visit (INDEPENDENT_AMBULATORY_CARE_PROVIDER_SITE_OTHER): Payer: PPO | Admitting: Otolaryngology

## 2019-02-03 ENCOUNTER — Ambulatory Visit (INDEPENDENT_AMBULATORY_CARE_PROVIDER_SITE_OTHER): Payer: PPO | Admitting: Otolaryngology

## 2019-02-07 ENCOUNTER — Encounter (INDEPENDENT_AMBULATORY_CARE_PROVIDER_SITE_OTHER): Payer: Self-pay | Admitting: Otolaryngology

## 2019-02-07 ENCOUNTER — Ambulatory Visit (INDEPENDENT_AMBULATORY_CARE_PROVIDER_SITE_OTHER): Payer: PPO | Admitting: Otolaryngology

## 2019-02-07 ENCOUNTER — Other Ambulatory Visit: Payer: Self-pay

## 2019-02-07 VITALS — Temp 97.2°F

## 2019-02-07 DIAGNOSIS — J342 Deviated nasal septum: Secondary | ICD-10-CM | POA: Diagnosis not present

## 2019-02-07 DIAGNOSIS — J310 Chronic rhinitis: Secondary | ICD-10-CM

## 2019-02-07 NOTE — Progress Notes (Signed)
HPI: Eduardo Fuentes is a 68 y.o. male who presents for evaluation of sinuses.  This patient is a patient of Dr. Berle Mull who he has seen in the past.  Over the past week and a half he has complained of some pressure over his eyes as well as a little bit of bloody mucus discharge from his nose.  Denies any yellow-green discharge from his nose.  No fever.  He has always had trouble breathing through the right side of his nose.  Past Medical History:  Diagnosis Date  . Arthritis    NECK  . Cough    NON-PRODUCTIVE  . Depressive disorder    follow by dr [facility]  . Diverticulosis   . Diverticulosis of colon   . Generalized anxiety disorder   . GERD (gastroesophageal reflux disease)    WATCHES DIET  . Hemorrhoid   . High blood pressure   . History of colon polyps   . History of kidney stones   . Microscopic hematuria   . Nocturia   . Panic attacks   . Prostate cancer ([place])   . Sinus infection   . Tinnitus    LEFT EAR -- CHRONIC   Past Surgical History:  Procedure Laterality Date  . COLONOSCOPY    . ESOPHAGOGASTRODUODENOSCOPY    . EXTRACORPOREAL SHOCK WAVE LITHOTRIPSY  YRS AGO  . PROSTATE BIOPSY    . RADIOACTIVE SEED IMPLANT N/A 02/02/2013   Procedure: RADIOACTIVE SEED IMPLANT;  Surgeon: Molli Hazard, MD;  Location: [facility];  Service: Urology;  Laterality: N/A;  . TONSILLECTOMY AND ADENOIDECTOMY  AS CHILD   Social History   Socioeconomic History  . Marital status: Married    Spouse name: [name]  . Number of children: 0  . Years of education: Not on file  . Highest education level: Not on file  Occupational History  . Occupation: retired  Tobacco Use  . Smoking status: Never Smoker  . Smokeless tobacco: Never Used  Substance and Sexual Activity  . Alcohol use: No  . Drug use: No  . Sexual activity: Never  Other Topics Concern  . Not on file  Social History Narrative   Pt lives in single story home with is wife   12th grade  education   Retired Programmer, systems from [employer]    Social Determinants of Radio broadcast assistant Strain:   . Difficulty of Paying Living Expenses: Not on file  Food Insecurity:   . Worried About Charity fundraiser in the Last Year: Not on file  . Ran Out of Food in the Last Year: Not on file  Transportation Needs:   . Lack of Transportation (Medical): Not on file  . Lack of Transportation (Non-Medical): Not on file  Physical Activity:   . Days of Exercise per Week: Not on file  . Minutes of Exercise per Session: Not on file  Stress:   . Feeling of Stress : Not on file  Social Connections:   . Frequency of Communication with Friends and Family: Not on file  . Frequency of Social Gatherings with Friends and Family: Not on file  . Attends Religious Services: Not on file  . Active Member of Clubs or Organizations: Not on file  . Attends Archivist Meetings: Not on file  . Marital Status: Not on file   Family History  Problem Relation Age of Onset  . Dementia Mother   . Anxiety disorder Mother   .  Alzheimer's disease Mother   . Heart disease Father   . Bone cancer Father   . Cancer Father        prostate  . Anxiety disorder Sister   . Colon cancer Neg Hx   . Esophageal cancer Neg Hx   . Rectal cancer Neg Hx   . Stomach cancer Neg Hx    Allergies  Allergen Reactions  . Aleve [Naproxen Sodium] Anaphylaxis and Other (See Comments)    GI UPSET Patient does not remember anaphylaxis  . Hyzaar [Losartan Potassium-Hctz] Other (See Comments)    Felt bad   . Lisinopril Cough  . Demerol [Meperidine] Rash        Prior to Admission medications   Medication Sig Start Date End Date Taking? Authorizing Provider  benzonatate (TESSALON) 200 MG capsule Take 1 capsule (200 mg total) by mouth 3 (three) times daily as needed for cough. 08/30/18  Yes Lauraine Rinne, NP  clonazePAM (KLONOPIN) 1 MG tablet Take 1 tablet at 8 am, 1/2 tablet at 2 pm and  1 tablet at bedtime for anxiety. Patient taking differently: Take 1 mg by mouth 3 (three) times daily. Pt states "I usually take 1 tablet three times a day" 11/05/11  Yes Readling, Milana Huntsman, MD  dimenhyDRINATE (DRAMAMINE PO) Take by mouth.   Yes [provider]  gabapentin (NEURONTIN) 300 MG capsule Take 300 mg by mouth once.   Yes [provider]  hydrochlorothiazide (MICROZIDE) 12.5 MG capsule Take 12.5 mg by mouth daily.   Yes [provider]  mirtazapine (REMERON) 30 MG tablet TK 1 T PO QHS 12/22/17  Yes [provider]  NEOMYCIN-POLYMYXIN-HYDROCORTISONE (CORTISPORIN) 1 % SOLN OTIC solution Apply to bed after soaking 03/11/18  Yes Regal, Tamala Fothergill, DPM  nortriptyline (PAMELOR) 50 MG capsule Take 25 mg by mouth daily at 8 pm.    Yes [provider]  OFEV 150 MG CAPS Take 150 mg by mouth 2 (two) times daily.  02/23/18  Yes [provider]  ondansetron (ZOFRAN) 4 MG tablet Take 1 tablet (4 mg total) by mouth every 8 (eight) hours as needed for nausea or vomiting. 09/24/18  Yes Lauraine Rinne, NP  pantoprazole (PROTONIX) 40 MG tablet Take 1 tablet (40 mg total) by mouth daily. 09/01/18  Yes Irene Shipper, MD     Positive ROS: Otherwise negative  All other systems have been reviewed and were otherwise negative with the exception of those mentioned in the HPI and as above.  Physical Exam: Constitutional: Alert, well-appearing, no acute distress Ears: External ears without lesions or tenderness. Ear canals are clear bilaterally with intact, clear TMs.  Nasal: External nose without lesions. Septum with moderate severe deviation to the right.. Clear nasal passages.  Difficult to adequately visualize the right middle meatus but no gross mucopurulent discharge noted.  Left nasal cavity and left middle meatus are clear. Oral: Lips and gums without lesions. Tongue and palate mucosa without lesions. Posterior oropharynx clear..  Patient is status post  tonsillectomy Neck: No palpable adenopathy or masses Respiratory: Breathing comfortably  Skin: No facial/neck lesions or rash noted.  Procedures  Assessment: Septal deviation to the right Chronic rhinitis with no clinical evidence of acute infection.  Plan: Recommended use of Nasacort 2 sprays each nostril at night and/or use of saline rinse. He will follow-up as needed  Radene Journey, MD

## 2019-02-10 ENCOUNTER — Encounter: Payer: Self-pay | Admitting: Internal Medicine

## 2019-02-10 ENCOUNTER — Ambulatory Visit (INDEPENDENT_AMBULATORY_CARE_PROVIDER_SITE_OTHER): Payer: PPO | Admitting: Internal Medicine

## 2019-02-10 ENCOUNTER — Encounter

## 2019-02-10 VITALS — BP 110/70 | HR 113 | Temp 98.5°F | Ht 69.0 in | Wt 178.4 lb

## 2019-02-10 DIAGNOSIS — K222 Esophageal obstruction: Secondary | ICD-10-CM

## 2019-02-10 DIAGNOSIS — R634 Abnormal weight loss: Secondary | ICD-10-CM

## 2019-02-10 DIAGNOSIS — R110 Nausea: Secondary | ICD-10-CM | POA: Diagnosis not present

## 2019-02-10 MED ORDER — ONDANSETRON HCL 4 MG PO TABS: ORAL_TABLET | ORAL | 6 refills | Status: DC

## 2019-02-10 NOTE — Patient Instructions (Signed)
We have sent the following medications to your pharmacy for you to pick up at your convenience:  Zofran

## 2019-02-10 NOTE — Progress Notes (Signed)
HISTORY OF PRESENT ILLNESS:  Eduardo Fuentes is a 68 y.o. male who presents today with a chief complaint of nausea, decreased appetite, and weight loss.  Patient states that he was placed on Ofev for pulmonary fibrosis in January.  Over the past 6 months he has had problems with nausea, decreased appetite, and occasional diarrhea.  Felt to be medication side effect.  He has been encouraged to eat when taking his medication.  However, his appetite has been off.  He does have a history of depression though feels this is under control.  He mentions occasional gastric upset for which she takes Pepto-Bismol.  He is on pantoprazole 40 mg daily which controls classic GERD symptoms.  He does have Zofran for nausea but is taking this sporadically.  He does have a history of peptic stricture for which she underwent upper endoscopy with esophageal dilation November 2019.  Aside from the stricture which was dilated, the examination was normal.  His last colonoscopy in 2015 revealed moderate diverticulosis but was otherwise normal.  Review of outside laboratories from January 17, 2019 shows normal liver tests.  Review of last chest x-ray shows stable changes.  He is accompanied today by his wife.  REVIEW OF SYSTEMS:  All non-GI ROS negative unless otherwise stated in the HPI except for  arthritis Past Medical History:  Diagnosis Date  . Arthritis    NECK  . Cough    NON-PRODUCTIVE  . Depressive disorder    follow by dr [facility]  . Diverticulosis   . Diverticulosis of colon   . Generalized anxiety disorder   . GERD (gastroesophageal reflux disease)    WATCHES DIET  . Hemorrhoid   . High blood pressure   . History of colon polyps   . History of kidney stones   . Microscopic hematuria   . Nocturia   . Panic attacks   . Prostate cancer ([place])   . Sinus infection   . Tinnitus    LEFT EAR -- CHRONIC    Past Surgical History:  Procedure Laterality Date  . COLONOSCOPY    .  ESOPHAGOGASTRODUODENOSCOPY    . EXTRACORPOREAL SHOCK WAVE LITHOTRIPSY  YRS AGO  . PROSTATE BIOPSY    . RADIOACTIVE SEED IMPLANT N/A 02/02/2013   Procedure: RADIOACTIVE SEED IMPLANT;  Surgeon: Molli Hazard, MD;  Location: [facility];  Service: Urology;  Laterality: N/A;  . TONSILLECTOMY AND ADENOIDECTOMY  AS CHILD    Social History [name]  reports that he has never smoked. He has never used smokeless tobacco. He reports that he does not drink alcohol or use drugs.  family history includes Alzheimer's disease in his mother; Anxiety disorder in his mother and sister; Bone cancer in his father; Cancer in his father; Dementia in his mother; Heart disease in his father.  Allergies  Allergen Reactions  . Aleve [Naproxen Sodium] Anaphylaxis and Other (See Comments)    GI UPSET Patient does not remember anaphylaxis  . Hyzaar [Losartan Potassium-Hctz] Other (See Comments)    Felt bad   . Lisinopril Cough  . Demerol [Meperidine] Rash            PHYSICAL EXAMINATION: Vital signs: BP 110/70 (BP Location: Left Arm, Patient Position: Sitting)   Pulse (!) 113   Temp 98.5 F (36.9 C)   Ht 5\' 9"  (1.753 m)   Wt 178 lb 6.4 oz (80.9 kg)   SpO2 96%   BMI 26.35 kg/m   Constitutional: generally well-appearing,  no acute distress Psychiatric: alert and oriented x3, cooperative Eyes: extraocular movements intact, anicteric, conjunctiva pink Mouth: oral pharynx moist, no lesions Neck: supple no lymphadenopathy Cardiovascular: heart regular rate and rhythm, no murmur Lungs: clear to auscultation bilaterally Abdomen: soft, nontender, nondistended, no obvious ascites, no peritoneal signs, normal bowel sounds, no organomegaly Rectal: Omitted Extremities: no clubbing, cyanosis, or lower extremity edema bilaterally Skin: no lesions on visible extremities Neuro: No focal deficits. No asterixis.    ASSESSMENT:  1.  Nausea with decreased appetite.  Likely  secondary to Ofev 2.  Occasional loose stools.  Likely secondary to Ofev 3.  Weight loss secondary to decreased appetite 4.  GERD with history of peptic stricture.  Asymptomatic post dilation on PPI 5.  Diverticulosis on colonoscopy 2015   PLAN:  1.  I recommended that he take Zofran 4 to 8 mg along with his Ofev, which he takes twice daily.  Hopefully this will result in less nausea and allow him to have improved appetite. 2.  Reflux precautions 3.  Continue PPI 4.  Contact the office if problems persist, in which case we may consider diagnostic upper endoscopy to be certain no other reasons for his symptom complex. 25 minutes spent face-to-face with the patient.  Greater than 50% the time used for counseling regarding his issues with nausea decreased appetite and weight loss.

## 2019-02-17 ENCOUNTER — Other Ambulatory Visit: Payer: Self-pay | Admitting: Pulmonary Disease

## 2019-03-24 ENCOUNTER — Ambulatory Visit: Payer: PPO

## 2019-03-24 DIAGNOSIS — F3342 Major depressive disorder, recurrent, in full remission: Secondary | ICD-10-CM | POA: Diagnosis not present

## 2019-03-24 DIAGNOSIS — F341 Dysthymic disorder: Secondary | ICD-10-CM | POA: Diagnosis not present

## 2019-03-28 ENCOUNTER — Telehealth: Payer: Self-pay | Admitting: Internal Medicine

## 2019-03-28 NOTE — Telephone Encounter (Signed)
Pt has been scheduled for phone visit on 04/01/2019 at 9:30a. Nothing further is needed.

## 2019-03-28 NOTE — Telephone Encounter (Signed)
[facility]   Patient [name] [address] and name Eduardo Fuentes   has IPF. Not seen me in loing time due to c-19. Is on ofev. Today he with wife who is my patient and told me had nausea. Next PFT is only in may 2021.   Plan  - give 15 min tele visit when I Am in [place] this Friday 04/01/2019 - I will review his case and prob change his PFT to April and visit with me to April depending in what he says  Thanks    SIGNATURE    Dr. Brand Males, M.D., F.C.C.P,  Pulmonary and Critical Care Medicine Staff Physician, [place] Director - Interstitial Lung Disease  Program  Pulmonary [place] at [place], [place], [address]  Pager: [phone], If no answer or between  15:00h - 7:00h: call [phone] Telephone: [phone]  10:39 AM 03/28/2019

## 2019-03-28 NOTE — Telephone Encounter (Signed)
Lm for pt

## 2019-03-28 NOTE — Telephone Encounter (Signed)
Margie  This below is just an update. Stil need to see him 04/01/2019   Did symptom score when he was with his wife. Appears nause with ofev  - what he wants to discuss mainly   SYMPTOM SCALE - ILD 03/28/2019   O2 use ra  Shortness of Breath 0 -> 5 scale with 5 being worst (score 6 If unable to do)  At rest 0  Simple tasks - showers, clothes change, eating, shaving 0  Household (dishes, doing bed, laundry) 1  Shopping 1  Walking level at own pace 1  Walking up Stairs 2  Total (30-36) Dyspnea Score 5  How bad is your cough? 1  How bad is your fatigue 1  How bad is nausea 3  How bad is vomiting?  0  How bad is diarrhea? 1  How bad is anxiety? 1  How bad is depression 1     He alsop had me ausucultate him   - he has basal velcro crackles  Plan Will do phone visit 04/01/2019 and then decide timing of face to face visit, office spiro and CT  Thanks    SIGNATURE    Dr. Brand Males, M.D., F.C.C.P,  Pulmonary and Critical Care Medicine Staff Physician, [place] Director - Interstitial Lung Disease  Program  Pulmonary [place] at [place], [place], [address]  Pager: [phone], If no answer or between  15:00h - 7:00h: call [phone] Telephone: [phone]  12:29 PM 03/28/2019

## 2019-04-01 ENCOUNTER — Ambulatory Visit (INDEPENDENT_AMBULATORY_CARE_PROVIDER_SITE_OTHER): Payer: PPO | Admitting: Internal Medicine

## 2019-04-01 ENCOUNTER — Encounter: Payer: Self-pay | Admitting: Internal Medicine

## 2019-04-01 ENCOUNTER — Other Ambulatory Visit (INDEPENDENT_AMBULATORY_CARE_PROVIDER_SITE_OTHER): Payer: PPO

## 2019-04-01 DIAGNOSIS — Z5181 Encounter for therapeutic drug level monitoring: Secondary | ICD-10-CM

## 2019-04-01 DIAGNOSIS — I2510 Atherosclerotic heart disease of native coronary artery without angina pectoris: Secondary | ICD-10-CM

## 2019-04-01 DIAGNOSIS — R110 Nausea: Secondary | ICD-10-CM

## 2019-04-01 DIAGNOSIS — J84112 Idiopathic pulmonary fibrosis: Secondary | ICD-10-CM

## 2019-04-01 LAB — HEPATIC FUNCTION PANEL
ALT: 15 U/L (ref 0–53)
AST: 23 U/L (ref 0–37)
Albumin: 3.8 g/dL (ref 3.5–5.2)
Alkaline Phosphatase: 80 U/L (ref 39–117)
Bilirubin, Direct: 0.1 mg/dL (ref 0.0–0.3)
Total Bilirubin: 0.5 mg/dL (ref 0.2–1.2)
Total Protein: 6.8 g/dL (ref 6.0–8.3)

## 2019-04-01 NOTE — Patient Instructions (Addendum)
ICD-10-CM   1. IPF (idiopathic pulmonary fibrosis) ([place])  J84.112   2. Encounter for therapeutic drug monitoring  Z51.81   3. Nausea  R11.0   4. Coronary artery calcification seen on CAT scan  I25.10    IPF (idiopathic pulmonary fibrosis) ([facility])  -Is clinically stable  Plan  -Continue nintedanib as before -Do spirometry and DLCO in April 2021 or May 2021 -Follow-up ILD clinic Dr. Chase Caller in May 2021 -In the future we could consider research as a care option  Encounter for therapeutic drug monitoring Nausea  -Mild to moderate nausea in the morning because of nintedanib  Plan  -Drop by any time at [facility] and do liver function test Maryjane Hurter one was in November 2020] -Try ginger capsules in the morning with nintedanib.  If not use-year-old Zofran for nausea -You can also try changing your breakfast to include bread and eggs and cereal and berries  Coronary artery calcification seen on CAT scan - new finding on CT scan October 2020  -Glad you did not have chest pain but sometimes this can be associated with blockage of the heart blood vessels  Plan  -Only do mild to moderate exercise at home using pulse ox monitor and monitoring of symptoms such as chest pain -Do not do heavy exercise -Referral to cardiology - Dr Einar Gip, or Dr Virgina Jock or [facility] heart care  Follow-up  - April.may 2021 with Dr Chase Caller - but after spirometry

## 2019-04-01 NOTE — Progress Notes (Signed)
Lft normal

## 2019-04-01 NOTE — Addendum Note (Signed)
Addended by: Maryanna Shape A on: 04/01/2019 10:14 AM   Modules accepted: Orders

## 2019-04-01 NOTE — Progress Notes (Signed)
OV 01/29/2018  Chief Complaint  Patient presents with  . Pulmonary Consult    self referral, abnormal CT, ? ILD from CT scan, SOB with exertion     Eduardo Fuentes presents self-referral for interstitial lung disease.  His wife Eduardo Fuentes used to be seen by me some years ago when she had surgical lung biopsy for ruling out interstitial lung disease which we did.  Patient is a non-smoker but has long suffered from acid reflux and esophageal stenosis.  He also has a sliding hiatal hernia.  He says since spring 2019 he has had insidious onset of shortness of breath that is progressive associated with some cough.  During this time he is also had worsening endogenous depression.  He was seeing a psychiatrist multiple antidepressants have failed.  Electroconvulsive therapy has been recommended and he had one on January 27, 2018 2 days ago.  During this time a chest x-ray was done as part of pre-electroconvulsive therapy evaluation and this suggested interstitial lung disease.  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  I agree with the radiologist.  The some amount of mediastinal adenopathy that is felt to be reactive.  There is also a sliding hiatal hernia.  It is described that these findings are new compared to 2014 chest x-ray.  At this point in time walking desaturation test he did not desaturate but dropped 4 points and pulse ox    [place] Integrated Comprehensive ILD Questionnaire  Symptoms: He reports shortness of breath onset gradually for the last 1 year.  It is getting worse with time.  He does have episodic dyspnea.  When he does shopping it is level 1.  When he walks on a level ground with others of his age at this level 1 walking up stairs at this level 1 but walking at his own pace at this level 2 and taking a shower at level 3.  He does have associated arthralgia and cough.  He is not sure  when the cough started.  It is present at all positions since it started it is the same as moderate in intensity.  He does cough at night.  He does bring up phlegm which is clear in color.  It gets worse when he lies down and it affects his voice and he does clear his throat.  He does not feel a tickle.  There is no wheezing.   Past Medical History : This is positive for major depression but he circled no for asthma, COPD.  Surgical no for heart failure.  Surgical no for collagen vascular disease with sleep apnea.  He did not answer for acid reflux which he does have..  Denies diabetes or thyroid disease or heart disease or pulmonary embolism or pneumonia   ROS:  .  Positive for difficulty swallowing due to his esophageal stricture history.  Does have nausea and acid reflux and snoring.  Otherwise no vomiting is positive for soreness across the body after electroconvulsive therapy 2 days ago.   FAMILY HISTORY of LUNG DISEASE: Denies family history of any pulmonary fibrosis COPD or asthma sarcoidosis   EXPOSURE HISTORY: Denies smoking cigarettes or tobacco or marijuana or cocaine.  No intravenous drug abuse.   HOME and HOBBY DETAILS : He did not list the age of the home but is lived in this home for 25 [place].  There is no  mold in the house of mildew.  The house is not damp.  Does not use a CPAP mask.  No nebulizer machine.  No use of Jacuzzi.  Nose fountain in the house.  No pet birds or gerbils.  Does not use feather pillows.  He is not sure if there is mold in the [facility] duct system.  Does not play wind instruments.  He occasionally mows the lawn   OCCUPATIONAL HISTORY (122 questions) : Positive for social gardening.  Did report that he is used feather pillows and blankets in the past but otherwise negative including working in a dusty environment   [place] (27 items): Positive for cancer chemotherapy but he does not know the details      OV  03/16/2018  Subjective:  Patient ID: Eduardo Fuentes, male , DOB: [birth year] , age 69 y.o. , MRN: [id number] , ADDRESS: [place] [address]   03/16/2018 -   Chief Complaint  Patient presents with  . Follow-up    Pt started OFEV 02/27/2018.  Pt states he has been doing okay since last visit. Denies any current complaints with the OFEV. Pt states he does have SOB which is mainy with activities.   IPF [clinical diagnosis based on classic UIP scan negative serology and age and male gender; diagnosis given February 11, 2017]  HPI Eduardo Fuentes 69 y.o. -IPF follow-up.  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  He was started on nintedanib.  He started this on February 27, 2018.  Suffice tolerating it well without any problems.  He is debating about starting pulmonary rehabilitation.  Mostly does not want to pay $20 a week for this.  The other alternative is that he can go to [employer] and exercise by himself without any co-pay but there is no trainer.  I asked him to look at rehab as a short-term thing with the help of a trainer and the cost of a trainer being $20 a week.  He then opened up to the idea of attending pulmonary rehabilitation.  He still and his wife both had questions about IPF and etiology.  Be on sliding hiatal hernia but not able to discover anything else.  His reflux is better controlled now with PPI.  In terms of her shortness of breath this is stable.  In terms of his cough this is stable.  He continues to deal with depression but is not suicidal or homicidal.  His walking desaturation test today shows stability.  Wife wanted some education material about IPF and nintedanib.         OV 04/29/2018  Subjective:  Patient ID: Eduardo Fuentes, male , DOB: Mar 12, 1950 , age 67 y.o. , MRN: [id number] , ADDRESS: [place] [address]   04/29/2018 -   Chief Complaint  Patient presents with  . Follow-up    Pt states he has had some weakness  going on which he states could be due to not eating enough with the OFEV. Pt also has had some loose stools as well as nausea.    IPF [clinical diagnosis based on classic UIP scan negative serology and age and male gender; diagnosis given February 11, 2017 and on Ofev since 02/27/2018   HPI Eduardo Fuentes 69 y.o. -presents for follow-up of his IPF.  He is now doing nintedanib for 2 months.  Overall stable.  Overall tolerating nintedanib really well.  He had some low appetite.  There.  Some nausea he had and that yesterday he had some diarrhea after eating pizza.  He thinks it is a pizza that caused the diarrhea not the nintedanib.  In terms of his dyspnea he stable quality of life is stable.  He said he is doing well with pulmonary rehabilitation.  Maintain his pulse ox 90% or so.  He is asking if the nintedanib is helping him in terms of disease stability.  Discussed that this might not be known fully in an individual.  His mood is better   Results for Eduardo Fuentes, Eduardo Fuentes (MRN [place]:[id number]) as of 03/16/2018 14:30  Ref. Range 01/29/2018 12:42  ASPERGILLUS FUMIGATUS Latest Ref Range: NEGATIVE  NEGATIVE  [place] Serum Latest Ref Range: NEGATIVE  NEGATIVE  Anti Nuclear Antibody(ANA) Latest Ref Range: NEGATIVE  POSITIVE (A)  ANA Pattern 1 Unknown Nuclear, Homogeneous (A)  ANA Titer 1 Latest Units: titer 0000000 (H)  Cyclic Citrullin Peptide Ab Latest Units: UNITS <16  RA Latex Turbid. Latest Ref Range: <14 IU/mL <14   ROS - per HPI  Results for Eduardo Fuentes, Eduardo Fuentes (MRN [place]:[id number]) as of 03/16/2018 14:54  Ref. Range 03/08/2018 11:50  AST Latest Ref Range: 0 - 37 U/L 23  ALT Latest Ref Range: 0 - 53 U/L 15  Total Protein Latest Ref Range: 6.0 - 8.3 g/dL 7.2    ROS - per HPI    OV 04/01/2019  Subjective:  Patient ID: Eduardo Fuentes, male , DOB: 1950/07/07 , age 41 y.o. , MRN: [id number] , ADDRESS: [place] [id number]   04/01/2019 -   Chief Complaint  Patient presents with  . Follow-up     Pt states hes been feeling ok but has had some nausea after breakfast and morning nap. Very little cough. SOB on exertion. Pt denies any wheezing, fever, or chills.    IPF [clinical diagnosis based on classic UIP scan negative serology and age and male gender; diagnosis given February 11, 2017 and on Ofev since 02/27/2018  HPI Eduardo Fuentes 69 y.o. -on this telephone visit reports nausea with nintedanib in the mornings.  He has a very light breakfast that has applesauce.  He does not eat much then after the breakfast at 9:00 he has some nausea that is mild to moderate in intensity.  Documented below with a question elicited a few days ago.  In terms of shortness of breath he is stable.  He is asking about exercising.  Otherwise he feels good.  At night only occasionally has nausea.  He has enough refills of Zofran but is not taking it.  I saw him a few days ago when he was in the office with his wife and at that time unofficial lung exam showed stable crackles and he looked well.  There is no evidence of ongoing depression or cognitive ability.  His memory is very sharp.    SYMPTOM SCALE - ILD 03/28/2019   O2 use ra  Shortness of Breath 0 -> 5 scale with 5 being worst (score 6 If unable to do)  At rest 0  Simple tasks - showers, clothes change, eating, shaving 0  Household (dishes, doing bed, laundry) 1  Shopping 1  Walking level at own pace 1  Walking up Stairs 2  Total (30-36) Dyspnea Score 5  How bad is your cough? 1  How bad is your fatigue 1  How bad is nausea 3  How bad is vomiting?  0  How bad is diarrhea?  1  How bad is anxiety? 1  How bad is depression 1      Results for Eduardo Fuentes, Eduardo Fuentes (MRN [place]:[id number]) as of 04/01/2019 09:43  Ref. Range 02/11/2018 13:52 08/25/2018 11:51 01/17/2019 10:59  FVC-Pre Latest Units: L 2.90 2.69 2.82  FVC-%Pred-Pre Latest Units: % 67 62 65   Results for Eduardo Fuentes, Eduardo Fuentes (MRN [place]:[id number]) as of 04/01/2019 09:43  Ref. Range 02/11/2018 13:52 08/25/2018  11:51 01/17/2019 10:59  DLCO unc Latest Units: ml/min/mmHg  14.69 14.84  DLCO unc % pred Latest Units: %  57 58      01/29/2018  03/16/2018 190# 04/29/2018 193#  O2 used Room air rooom air Room air  Number laps completed 3 3 3   Comments about pace 250 feet x 3 laps on POD B at [employer] pace Normal pace  Resting Pulse Ox/HR 100% and 89/min 100% and 110/min 96% and 93/min  Final Pulse Ox/HR 96% and 109/min 97% and 118/min 91% and 107/min  Desaturated </= 88% no no no  Desaturated <= 3% points Yes, 4poin Yes, 3 poitns Yes, 5 points  Got Tachycardic >/= 90/min yes yes yes  Symptoms at end of test no Mild dyspnea Mild dyspnea  Miscellaneous comments no x x   IMPRESSION: 1. The appearance of the lungs is compatible with interstitial lung disease, with a spectrum of findings considered probable usual interstitial pneumonia (UIP) per current ATS guidelines. No significant progression of disease compared to the prior study. 2. Aortic atherosclerosis, in addition to left main and 3 vessel coronary artery disease. Please note that although the presence of coronary artery calcium documents the presence of coronary artery disease, the severity of this disease and any potential stenosis cannot be assessed on this non-gated CT examination. Assessment for potential risk factor modification, dietary therapy or pharmacologic therapy may be warranted, if clinically indicated.  Aortic Atherosclerosis (ICD10-I70.0).   Electronically Signed   By: Vinnie Langton M.D.   On: 12/08/2018 15:10 ROS - per HPI  Results for Eduardo Fuentes, Eduardo Fuentes (MRN [place]:[id number]) as of 04/01/2019 09:43  Ref. Range 01/17/2019 12:25  AST Latest Ref Range: 0 - 37 U/L 27  ALT Latest Ref Range: 0 - 53 U/L 21     has a past medical history of Arthritis, Cough, Depressive disorder, Diverticulosis, Diverticulosis of colon, Generalized anxiety disorder, GERD (gastroesophageal reflux disease), Hemorrhoid, High blood  pressure, History of colon polyps, History of kidney stones, Microscopic hematuria, Nocturia, Panic attacks, Prostate cancer ([place]), Sinus infection, and Tinnitus.   reports that he has never smoked. He has never used smokeless tobacco.  Past Surgical History:  Procedure Laterality Date  . COLONOSCOPY    . ESOPHAGOGASTRODUODENOSCOPY    . EXTRACORPOREAL SHOCK WAVE LITHOTRIPSY  YRS AGO  . PROSTATE BIOPSY    . RADIOACTIVE SEED IMPLANT N/A 02/02/2013   Procedure: RADIOACTIVE SEED IMPLANT;  Surgeon: Molli Hazard, MD;  [facility];  Service: Urology;  Laterality: N/A;  . TONSILLECTOMY AND ADENOIDECTOMY  AS CHILD    Allergies  Allergen Reactions  . Aleve [Naproxen Sodium] Anaphylaxis and Other (See Comments)    GI UPSET Patient does not remember anaphylaxis  . Hyzaar [Losartan Potassium-Hctz] Other (See Comments)    Felt bad   . Lisinopril Cough  . Demerol [Meperidine] Rash         Immunization History  Administered Date(s) Administered  . Fluad Quad(high Dose 65+) 12/30/2018  . Influenza Inj Mdck Quad Pf 11/09/2017  . Pneumococcal Conjugate-13  10/26/2015  . Pneumococcal Polysaccharide-23 10/29/2016  . Zoster Recombinat (Shingrix) 12/03/2018, 02/01/2019    Family History  Problem Relation Age of Onset  . Dementia Mother   . Anxiety disorder Mother   . Alzheimer's disease Mother   . Heart disease Father   . Bone cancer Father   . Cancer Father        prostate  . Anxiety disorder Sister   . Colon cancer Neg Hx   . Esophageal cancer Neg Hx   . Rectal cancer Neg Hx   . Stomach cancer Neg Hx      Current Outpatient Medications:  .  clonazePAM (KLONOPIN) 1 MG tablet, Take 1 tablet at 8 am, 1/2 tablet at 2 pm and 1 tablet at bedtime for anxiety. (Patient taking differently: Take 1 mg by mouth 3 (three) times daily. Pt states "I usually take 1 tablet three times a day"), Disp: 75 tablet, Rfl: 0 .  gabapentin (NEURONTIN) 300 MG capsule, Take  300 mg by mouth once., Disp: , Rfl:  .  hydrochlorothiazide (MICROZIDE) 12.5 MG capsule, Take 12.5 mg by mouth daily., Disp: , Rfl:  .  mirtazapine (REMERON) 30 MG tablet, TK 1 T PO QHS, Disp: , Rfl: 4 .  nortriptyline (PAMELOR) 50 MG capsule, Take 25 mg by mouth daily at 8 pm. , Disp: , Rfl:  .  OFEV 150 MG CAPS, TAKE 1 CAPSULE BY MOUTH TWICE DAILY, TAKE 12 HOURS APART WITH FOOD., Disp: 60 capsule, Rfl: 10 .  ondansetron (ZOFRAN) 4 MG tablet, Take 1 tablet (4 mg total) by mouth every 8 (eight) hours as needed for nausea or vomiting., Disp: 30 tablet, Rfl: 0 .  ondansetron (ZOFRAN) 4 MG tablet, Take one or two tablets with Ofev., Disp: 120 tablet, Rfl: 6 .  pantoprazole (PROTONIX) 40 MG tablet, Take 1 tablet (40 mg total) by mouth daily., Disp: 90 tablet, Rfl: 3      Objective:   There were no vitals filed for this visit.  Estimated body mass index is 26.35 kg/m as calculated from the following:   Height as of 02/10/19: 5\' 9"  (1.753 m).   Weight as of 02/10/19: 178 lb 6.4 oz (80.9 kg).  @WEIGHTCHANGE @  There were no vitals filed for this visit.   Physical Exam   Sounded normal on telpehone visit        Assessment:       ICD-10-CM   1. IPF (idiopathic pulmonary fibrosis) ([place])  J84.112   2. Encounter for therapeutic drug monitoring  Z51.81   3. Nausea  R11.0   4. Coronary artery calcification seen on CAT scan  I25.10        Plan:     Patient Instructions     ICD-10-CM   1. IPF (idiopathic pulmonary fibrosis) ([place])  J84.112   2. Encounter for therapeutic drug monitoring  Z51.81   3. Nausea  R11.0   4. Coronary artery calcification seen on CAT scan  I25.10    IPF (idiopathic pulmonary fibrosis) ([facility])  -Is clinically stable  Plan  -Continue nintedanib as before -Do spirometry and DLCO in April 2021 or May 2021 -Follow-up ILD clinic Dr. Chase Caller in May 2021 -In the future we could consider research as a care option  Encounter for therapeutic drug  monitoring Nausea  -Mild to moderate nausea in the morning because of nintedanib  Plan  -Drop by any time at [facility] and do liver function test Maryjane Hurter one was in November 2020] -Try ginger  capsules in the morning with nintedanib.  If not use-year-old Zofran for nausea -You can also try changing your breakfast to include bread and eggs and cereal and berries  Coronary artery calcification seen on CAT scan - new finding on CT scan October 2020  -Glad you did not have chest pain but sometimes this can be associated with blockage of the heart blood vessels  Plan  -Only do mild to moderate exercise at home using pulse ox monitor and monitoring of symptoms such as chest pain -Do not do heavy exercise -Referral to cardiology - Dr Einar Gip, or Dr Virgina Jock or [facility] heart care  Follow-up  - April.may 2021 with Dr Chase Caller - but after spirometry    (Level 04: Estb 30-39 min  visit type: telephone visit visit spent in total care time and counseling or/and coordination of care by this undersigned MD - Dr Brand Males. This includes one or more of the following on this same day 04/01/2019: pre-charting, chart review, note writing, documentation discussion of test results, diagnostic or treatment recommendations, prognosis, risks and benefits of management options, instructions, education, compliance or risk-factor reduction. It excludes time spent by the [place] or office staff in the care of the patient . Actual time is 32 min)    SIGNATURE    Dr. Brand Males, M.D., F.C.C.P,  Pulmonary and Critical Care Medicine Staff Physician, [place] Director - Interstitial Lung Disease  Program  [facility] [place] [facility] [place], [place], [address]  Pager: [phone], If no answer or between  15:00h - 7:00h: call [phone] Telephone: [phone]  10:05 AM 04/01/2019

## 2019-04-01 NOTE — Addendum Note (Signed)
Addended by: Suzzanne Cloud E on: 04/01/2019 11:51 AM   Modules accepted: Orders

## 2019-04-02 ENCOUNTER — Ambulatory Visit: Payer: PPO | Attending: Internal Medicine

## 2019-04-02 DIAGNOSIS — Z23 Encounter for immunization: Secondary | ICD-10-CM

## 2019-04-02 NOTE — Progress Notes (Signed)
   Covid-19 Vaccination Clinic  Name:  Eduardo Fuentes    MRN: [place]:[id number] DOB: Feb 06, 1951  04/02/2019  Mr. Eduardo Fuentes was observed post Covid-19 immunization for 15 minutes without incidence. He was provided with Vaccine Information Sheet and instruction to access the V-Safe system.   Mr. Eduardo Fuentes was instructed to call 911 with any severe reactions post vaccine: Marland Kitchen Difficulty breathing  . Swelling of your face and throat  . A fast heartbeat  . A bad rash all over your body  . Dizziness and weakness    Immunizations Administered    Name Date Dose VIS Date Route   Pfizer COVID-19 Vaccine 04/02/2019  1:06 PM 0.3 mL 02/04/2019 Intramuscular   Manufacturer: [place]   Lot: [id number]   [place]: [id number]

## 2019-04-05 ENCOUNTER — Telehealth: Payer: Self-pay

## 2019-04-05 NOTE — Telephone Encounter (Addendum)
Received paperwork from [employer] on 04/04/19. Dr. Chase Caller is not in this office this week, so I forwarded paperwork to [place].  This morning we received a denial letter from them due to not having forms signed. I called to verify with Raquel Sarna that she received the forms and she stated that she did however Dr. Chase Caller is not in clinic this week, he is in the hospital.  I called [employer] to inform them of this.

## 2019-04-07 ENCOUNTER — Telehealth: Payer: Self-pay | Admitting: Pulmonary Disease

## 2019-04-07 NOTE — Telephone Encounter (Signed)
Called and spoke with pt letting him know the info stated by [name]. After stating the info to pt, pt stated he wanted to call PCP to further discuss this with him prior to a cardiology referral happening.nothing further needed.

## 2019-04-07 NOTE — Telephone Encounter (Signed)
Sorry let me help clarify for the patient.  The CT scan showed no significant progression of his interstitial lung disease.  Which is what we are following him for.  That is correct.  With Dr. Chase Caller is referring to is that there are coronary artery calcification which is simply stating that there is plaque in some of his arteries based off his CT.  Due to this plaque Dr. Chase Caller would prefer for him to be evaluated by primary care or cardiology.  Patient does reserve the right that he could follow-up with primary care first to further review.  Having coronary artery calcifications on a CT is not diagnostic.  It does require additional work-up to look at cholesterol levels.  I do agree with the referral.    Hope that helps.  Wyn Quaker, FNP

## 2019-04-07 NOTE — Telephone Encounter (Signed)
Called and spoke with pt. Pt stated when he had televisit with MR 2/5, MR mentioned to him that he needed to be referred to cardiology due to coronary artery calcification seen on his recent CT 12/08/18.  Pt stated when he had received a call from our office after the scan was reviewed by [name], nothing was mentioned to him about the coronary artery calcification at that time as pt stated he was told that the scan showed no significant progression and was showing stability.  Pt wanted to hear from [name] to see if it was necessary for him to be referred to cardiology per what MR stated. [name], please advise on this as pt wants your opinion on this.

## 2019-04-12 DIAGNOSIS — I2510 Atherosclerotic heart disease of native coronary artery without angina pectoris: Secondary | ICD-10-CM | POA: Diagnosis not present

## 2019-04-12 NOTE — Telephone Encounter (Signed)
Please let me know if the forms will be sent and if you need me to do anything .

## 2019-04-12 NOTE — Telephone Encounter (Signed)
MR is in office today. Forms were handed to MR who stated he would fill out and return back to me. Once form has been signed, will fax to [employer] for pt. Will update this once form has been faxed.

## 2019-04-14 ENCOUNTER — Ambulatory Visit: Payer: PPO

## 2019-04-15 NOTE — Telephone Encounter (Signed)
I was handed today right now and it is on my desk -the side desk where there is a stack of papers "to do papers"

## 2019-04-18 NOTE — Telephone Encounter (Signed)
Received approval for OFEV 150MG  capsule through 04/14/2020.

## 2019-04-27 ENCOUNTER — Ambulatory Visit: Payer: PPO | Admitting: Cardiology

## 2019-04-27 ENCOUNTER — Ambulatory Visit: Payer: PPO | Attending: Internal Medicine

## 2019-04-27 DIAGNOSIS — Z23 Encounter for immunization: Secondary | ICD-10-CM | POA: Insufficient documentation

## 2019-04-27 NOTE — Progress Notes (Signed)
   Covid-19 Vaccination Clinic  Name:  Eduardo Fuentes    MRN: [id number] DOB: 1950-12-16  04/27/2019  [name] was observed post Covid-19 immunization for 15 minutes without incident. He was provided with Vaccine Information Sheet and instruction to access the V-Safe system.   [name] was instructed to call 911 with any severe reactions post vaccine: Marland Kitchen Difficulty breathing  . Swelling of face and throat  . A fast heartbeat  . A bad rash all over body  . Dizziness and weakness   Immunizations Administered    Name Date Dose VIS Date Route   Pfizer COVID-19 Vaccine 04/27/2019  9:19 AM 0.3 mL 02/04/2019 Intramuscular   Manufacturer: [place]   Lot: [id number]   [place]: [id number]

## 2019-05-06 ENCOUNTER — Encounter: Payer: Self-pay | Admitting: Cardiology

## 2019-05-06 ENCOUNTER — Ambulatory Visit (INDEPENDENT_AMBULATORY_CARE_PROVIDER_SITE_OTHER): Payer: PPO | Admitting: Cardiology

## 2019-05-06 ENCOUNTER — Other Ambulatory Visit: Payer: Self-pay

## 2019-05-06 DIAGNOSIS — I2510 Atherosclerotic heart disease of native coronary artery without angina pectoris: Secondary | ICD-10-CM

## 2019-05-06 NOTE — Progress Notes (Signed)
Primary Care Provider: Lujean Amel, MD Cardiologist: No primary care provider on file. Electrophysiologist: None  Pulmonologist: Dr. Chase Caller GI: Dr. Henrene Pastor Urology: Dr. Serita Butcher Neuro: Dr. Jannifer Franklin Psych: Dr. Kathrynn Humble  Clinic Note: Chief Complaint  Patient presents with  . New Patient (Initial Visit)    Coronary Calcium on CT Chest    HPI:    Eduardo Fuentes is a 69 y.o. male who is being seen today for the evaluation of INCIDENTAL FINDING OF CAD ON CT SCAN at the request of Koirala, [name], MD.  Eduardo Fuentes was referred by Dr. Dorthy Cooler following his visit on February 16.  Patient had a CT scan done as part of his routine evaluation and follow-up for his interstitial lung disease.  CT scan was performed by pulmonary medicine.  CT scan showed incidental atherosclerosis of the coronary arteries and cardiology consultation was recommended.  The patient denied any symptoms of chest pain or pressure with rest or exertion. -> He has a history of hypertension and borderline lipids along with IPF.  He does have a family history of his father having an MI in his 91s leading to CABG but ended up dying later on life with prostate cancer. --> He is a lifetime non-smoker.  Recent Hospitalizations: None  Reviewed  CV studies:    The following studies were reviewed today: (if available, images/films reviewed: From Epic Chart or Care Everywhere) . By patient report, he had a stress test back in 2010 that was negative. . October 2020: CTA chest showed lung appearance compatible with interstitial lung disease-spectrum of findings considered -> probable UIP.  No significant progressive disease.  Also noted three-vessel coronary disease with left main involvement.   Interval History:   Eduardo Fuentes presents here today for cardiology evaluation.  He really says it he had some baseline shortness of breath from his lung disease, but denies any chest pain or pressure with rest or exertion.  He did  3 hours yard work just today with no chest pain.  He only noted his routine shortness of breath that has not been a different.  He can walk several times on the block without any difficulty and no chest pain.  Only stopping for shortness of breath. He does not have any heart failure symptoms or arrhythmia symptoms. Although his heart rate is little fast today on exam, he denies any sensation of tachycardia.  He has had somewhat progressively worsening dyspnea and somewhat episodic dyspnea.  It has worsened over the last year, thought to be related to his IPF.  He has night cough.  Plan is for him to initiate cardiac rehab and has also started on nintedanib for IPF.   CV Review of Symptoms (Summary): positive for - dyspnea on exertion and Intermittent cough and wheezing.  Related to pulmonary fibrosis. negative for - chest pain, edema, irregular heartbeat, orthopnea, palpitations, paroxysmal nocturnal dyspnea, rapid heart rate, shortness of breath or Syncope/near syncope, TIA/amaurosis fugax, claudication  The patient does not have symptoms concerning for COVID-19 infection (fever, chills, cough, or new shortness of breath).  The patient is practicing social distancing & Masking.    REVIEWED OF SYSTEMS   Review of Systems  Constitutional: Negative for malaise/fatigue.  HENT: Negative for congestion.   Respiratory: Positive for cough, shortness of breath (Baseline) and wheezing (Not very much).   Gastrointestinal: Negative for blood in stool and melena.  Genitourinary: Negative for hematuria.  Neurological: Negative for dizziness, weakness and headaches.  Psychiatric/Behavioral: Negative for  memory loss. The patient does not have insomnia.   All other systems reviewed and are negative.  I have reviewed and (if needed) personally updated the patient's problem list, medications, allergies, past medical and surgical history, social and family history.   PAST MEDICAL HISTORY   Past Medical  History:  Diagnosis Date  . Arthritis    NECK  . Cough    NON-PRODUCTIVE  . Depressive disorder 10/2011   follow by dr [facility] psychiatry; has anxiety and panic disorder as well.  . Diverticulosis   . Diverticulosis of colon    Followed by Dr. Henrene Pastor  . Essential hypertension   . Generalized anxiety disorder   . GERD (gastroesophageal reflux disease)    WATCHES DIET  . Hemorrhoid   . Hiatal hernia    Describes a sliding hiatal hernia  . History of colon polyps   . History of kidney stones   . Idiopathic pulmonary fibrosis ([place])   . Microscopic hematuria   . Nocturia   . Panic attacks   . Prostate cancer ([place]) 07/2012   Dr. Arnell Sieving urology.  . Sinus infection   . Tinnitus    LEFT EAR -- CHRONIC    PAST SURGICAL HISTORY   Past Surgical History:  Procedure Laterality Date  . COLONOSCOPY    . ESOPHAGOGASTRODUODENOSCOPY    . EXTRACORPOREAL SHOCK WAVE LITHOTRIPSY  YRS AGO  . PROSTATE BIOPSY    . RADIOACTIVE SEED IMPLANT N/A 02/02/2013   Procedure: RADIOACTIVE SEED IMPLANT;  Surgeon: Molli Hazard, MD;  Location: [facility];  Service: Urology;  Laterality: N/A;  . TONSILLECTOMY AND ADENOIDECTOMY  AS CHILD    MEDICATIONS/ALLERGIES   Current Meds  Medication Sig  . clonazePAM (KLONOPIN) 1 MG tablet Take 1 tablet at 8 am, 1/2 tablet at 2 pm and 1 tablet at bedtime for anxiety. (Patient taking differently: TAKES 2-3 TABLETS EACH DAY.)  . gabapentin (NEURONTIN) 300 MG capsule Take 600 mg by mouth 2 (two) times daily.   . hydrochlorothiazide (MICROZIDE) 12.5 MG capsule Take 12.5 mg by mouth daily.  . mirtazapine (REMERON) 30 MG tablet Take 30 mg by mouth at bedtime.   . nortriptyline (PAMELOR) 50 MG capsule Take 50 mg by mouth at bedtime.   . OFEV 150 MG CAPS TAKE 1 CAPSULE BY MOUTH TWICE DAILY, TAKE 12 HOURS APART WITH FOOD.  Marland Kitchen ondansetron (ZOFRAN) 4 MG tablet Take one or two tablets with Ofev.  . pantoprazole (PROTONIX) 40 MG tablet Take 1  tablet (40 mg total) by mouth daily.  . [DISCONTINUED] ondansetron (ZOFRAN) 4 MG tablet Take 1 tablet (4 mg total) by mouth every 8 (eight) hours as needed for nausea or vomiting.    Allergies  Allergen Reactions  . Aleve [Naproxen Sodium] Anaphylaxis and Other (See Comments)    GI UPSET Patient does not remember anaphylaxis  . Hyzaar [Losartan Potassium-Hctz] Other (See Comments)    Felt bad   . Lisinopril Cough  . Demerol [Meperidine] Rash         SOCIAL HISTORY/FAMILY HISTORY   Social History   Tobacco Use  . Smoking status: Never Smoker  . Smokeless tobacco: Never Used  Substance Use Topics  . Alcohol use: No  . 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  No caffeine.    OBJCTIVE -PE, EKG, labs   Wt Readings from Last 3 Encounters:  05/06/19 182 lb (82.6 kg)  02/10/19 178 lb 6.4 oz (80.9 kg)  01/17/19 179 lb 12.8 oz (81.6 kg)    Physical Exam: BP 114/76 (BP Location: Right Arm, Patient Position: Sitting, Cuff Size: Normal)   Pulse (!) 104   Temp (!) 95 F (35 C)   Ht 5\' 9"  (1.753 m)   Wt 182 lb (82.6 kg)   BMI 26.88 kg/m  Physical Exam  Constitutional: He is oriented to person, place, and time. He appears well-developed and well-nourished. No distress.  Relatively healthy-appearing.  Well-groomed.  HENT:  Head: Normocephalic and atraumatic.  Eyes: Pupils are equal, round, and reactive to light. Conjunctivae and EOM are normal.  Neck: No hepatojugular reflux and no JVD present. Carotid bruit is not present.  Cardiovascular: Normal rate, regular rhythm, S1 normal, S2 normal and intact distal pulses.  No extrasystoles are present. PMI is not displaced. Exam reveals no gallop and no friction rub.  Murmur (Soft 1/6 SEM at RUSB.) heard. Pulmonary/Chest: No respiratory  distress. He has no wheezes. He has no rales.  Mild diffuse interstitial lung sounds and sandpaper crackles.  Abdominal: Soft. Bowel sounds are normal. He exhibits no distension. There is no abdominal tenderness. There is no rebound.  Musculoskeletal:        General: No edema. Normal range of motion.     Cervical back: Normal range of motion and neck supple.  Neurological: He is alert and oriented to person, place, and time. No cranial nerve deficit.  Psychiatric: He has a normal mood and affect. His behavior is normal. Judgment and thought content normal.  Vitals reviewed.    Adult ECG Report  Rate: 104 ;  Rhythm: sinus tachycardia and Left atrial abnormality, otherwise normal axis, intervals and durations.;   Narrative Interpretation: Sinus tachycardia otherwise normal  Recent Labs: November 18, 2018: TC 147, TG 130, HDL 30, LDL 91.  Hgb 15.2.  Cr 1.34.  K+ 4.5.  TSH 2.19  ASSESSMENT/PLAN    Problem List Items Addressed This Visit    Coronary artery calcification seen on CAT scan (Chronic)    Relatively asymptomatic although some of his dyspnea could be related to coronary disease.  I am a little bit concerned with his family history having a father died of an MI age older than him.  He did indicate his father was a smoker.  He does have respecters of hypertension and body for borderline lipids.  (Current LDL was 91 by most recent labs).  I think we need to quantify the amount of coronary calcification there is prior to considering the significance of the coronary calcification. If symptomatic and levels are high, would probably consider coronary CTA before.  Otherwise we will simply use this to recommend more aggressive management of lipids etc.  Thankfully, he is not actively having angina symptoms and I think we have a good explanation for his dyspnea.  Plan: Baseline screening to determine severity with CORONARY CALCIUM SCORE/CT CARDIAC SCORING      Relevant Orders   EKG  12-Lead (Completed)   CT CARDIAC SCORING       COVID-19 Education: The signs and symptoms of COVID-19 were discussed with the patient and how to seek care for testing (follow up with PCP or arrange E-visit).   The importance of social distancing was discussed today.  I spent a total of 22 minutes with the  patient. >  50% of the time was spent in direct patient consultation.  Additional time spent with chart review  / charting (studies, outside notes, etc): 10 Total Time: 32 min   Current medicines are reviewed at length with the patient today.  (+/- concerns) none   Patient Instructions / Medication Changes & Studies & Tests Ordered   Patient Instructions  Medication Instructions:  NO CHANGES *If you need a refill on your cardiac medications before your next appointment, please call your pharmacy*   Lab Work: NOT NEEDED.   Testing/Procedures:  CT coronary calcium score. This test is done at [address]. This is $150 out of pocket.  Follow-Up: At [facility], you and your health needs are our priority.  As part of our continuing mission to provide you with exceptional heart care, we have created designated Provider Care Teams.  These Care Teams include your primary Cardiologist (physician) and Advanced Practice Providers (APPs -  Physician Assistants and Nurse Practitioners) who all work together to provide you with the care you need, when you need it.    Your next appointment:   1 month(s)  The format for your next appointment:   In Person  Provider:   Glenetta Hew, MD   Studies Ordered:   Orders Placed This Encounter  Procedures  . CT CARDIAC SCORING  . EKG 12-Lead     Glenetta Hew, M.D., M.S. Interventional Cardiologist   Pager # [phone] Phone # [phone] [address] [place], [place] [address]   Thank you for choosing Heartcare at [facility]!!

## 2019-05-06 NOTE — Patient Instructions (Signed)
Medication Instructions:  NO CHANGES *If you need a refill on your cardiac medications before your next appointment, please call your pharmacy*   Lab Work: NOT NEEDED.   Testing/Procedures:  CT coronary calcium score. This test is done at [address]. This is $150 out of pocket.   Coronary CalciumScan A coronary calcium scan is an imaging test used to look for deposits of calcium and other fatty materials (plaques) in the inner lining of the blood vessels of the heart (coronary arteries). These deposits of calcium and plaques can partly clog and narrow the coronary arteries without producing any symptoms or warning signs. This puts a person at risk for a heart attack. This test can detect these deposits before symptoms develop. Tell a health care provider about:  Any allergies you have.  All medicines you are taking, including vitamins, herbs, eye drops, creams, and over-the-counter medicines.  Any problems you or family members have had with anesthetic medicines.  Any blood disorders you have.  Any surgeries you have had.  Any medical conditions you have.  Whether you are pregnant or may be pregnant. What are the risks? Generally, this is a safe procedure. However, problems may occur, including:  Harm to a pregnant woman and her unborn baby. This test involves the use of radiation. Radiation exposure can be dangerous to a pregnant woman and her unborn baby. If you are pregnant, you generally should not have this procedure done.  Slight increase in the risk of cancer. This is because of the radiation involved in the test. What happens before the procedure? No preparation is needed for this procedure. What happens during the procedure?  You will undress and remove any jewelry around your neck or chest.  You will put on a hospital gown.  Sticky electrodes will be placed on your chest. The electrodes will be connected to an electrocardiogram (ECG) machine  to record a tracing of the electrical activity of your heart.  A CT scanner will take pictures of your heart. During this time, you will be asked to lie still and hold your breath for 2-3 seconds while a picture of your heart is being taken. The procedure may vary among health care providers and hospitals. What happens after the procedure?  You can get dressed.  You can return to your normal activities.  It is up to you to get the results of your test. Ask your health care provider, or the department that is doing the test, when your results will be ready. Summary  A coronary calcium scan is an imaging test used to look for deposits of calcium and other fatty materials (plaques) in the inner lining of the blood vessels of the heart (coronary arteries).  Generally, this is a safe procedure. Tell your health care provider if you are pregnant or may be pregnant.  No preparation is needed for this procedure.  A CT scanner will take pictures of your heart.  You can return to your normal activities after the scan is done. This information is not intended to replace advice given to you by your health care provider. Make sure you discuss any questions you have with your health care provider. Document Released: 08/09/2007 Document Revised: 12/31/2015 Document Reviewed: 12/31/2015 Elsevier Interactive Patient Education  2017 [place]: At [facility], you and your health needs are our priority.  As part of our continuing mission to provide you with exceptional heart care, we have created designated  Provider Care Teams.  These Care Teams include your primary Cardiologist (physician) and Advanced Practice Providers (APPs -  Physician Assistants and Nurse Practitioners) who all work together to provide you with the care you need, when you need it.    Your next appointment:   1 month(s)  The format for your next appointment:   In Person  Provider:   Glenetta Hew,  MD

## 2019-05-09 ENCOUNTER — Telehealth: Payer: Self-pay | Admitting: Internal Medicine

## 2019-05-09 ENCOUNTER — Encounter: Payer: Self-pay | Admitting: Internal Medicine

## 2019-05-09 NOTE — Telephone Encounter (Signed)
The pt has been advised and was transferred to the schedulers to make appt for endo colon.

## 2019-05-09 NOTE — Telephone Encounter (Signed)
Chart and recent OV reviewed. OK to schedule colon (change in bowel habits, diarrhea, abdominal pain) and EGD (pain, nausea) in [place]. Thanks

## 2019-05-09 NOTE — Telephone Encounter (Signed)
The pt has complaint of lower abd cramping and nausea. The pt has similar symptoms in December but they are occurring more frequently. (about every 3-4 days).  Has some constipation/diarrhea as well.  I scheduled the pt an appt for 4/21 at 920 am. The pt would like message sent to Dr Henrene Pastor to confirm office appt is needed.  The pt would prefer an endo colon scheduled without office visit.  Please advise

## 2019-05-12 ENCOUNTER — Encounter: Payer: Self-pay | Admitting: Cardiology

## 2019-05-12 NOTE — Assessment & Plan Note (Addendum)
Relatively asymptomatic although some of his dyspnea could be related to coronary disease.  I am a little bit concerned with his family history having a father died of an MI age older than him.  He did indicate his father was a smoker.  He does have respecters of hypertension and body for borderline lipids.  (Current LDL was 91 by most recent labs).  I think we need to quantify the amount of coronary calcification there is prior to considering the significance of the coronary calcification. If symptomatic and levels are high, would probably consider coronary CTA before.  Otherwise we will simply use this to recommend more aggressive management of lipids etc.  Thankfully, he is not actively having angina symptoms and I think we have a good explanation for his dyspnea.  Plan: Baseline screening to determine severity with CORONARY CALCIUM SCORE/CT CARDIAC SCORING

## 2019-05-19 ENCOUNTER — Other Ambulatory Visit: Payer: Self-pay

## 2019-05-19 ENCOUNTER — Ambulatory Visit (INDEPENDENT_AMBULATORY_CARE_PROVIDER_SITE_OTHER)
Admission: RE | Admit: 2019-05-19 | Discharge: 2019-05-19 | Disposition: A | Discharge status: Home / Self Care | Payer: Self-pay | Source: Ambulatory Visit | Attending: Cardiology | Admitting: Cardiology

## 2019-05-19 DIAGNOSIS — I2510 Atherosclerotic heart disease of native coronary artery without angina pectoris: Secondary | ICD-10-CM

## 2019-05-19 DIAGNOSIS — J849 Interstitial pulmonary disease, unspecified: Secondary | ICD-10-CM | POA: Diagnosis not present

## 2019-06-06 ENCOUNTER — Other Ambulatory Visit: Payer: Self-pay

## 2019-06-06 ENCOUNTER — Encounter: Payer: Self-pay | Admitting: Cardiology

## 2019-06-06 ENCOUNTER — Ambulatory Visit: Payer: PPO | Admitting: Cardiology

## 2019-06-06 VITALS — BP 124/82 | HR 62 | Temp 97.1°F | Ht 69.0 in | Wt 182.0 lb

## 2019-06-06 DIAGNOSIS — I2510 Atherosclerotic heart disease of native coronary artery without angina pectoris: Secondary | ICD-10-CM

## 2019-06-06 NOTE — Patient Instructions (Signed)
Medication Instructions:  NO CHANGES *If you need a refill on your cardiac medications before your next appointment, please call your pharmacy*   Lab Work: NOT NEEDED   Testing/Procedures: NOT NEEDED   Follow-Up: At [facility], you and your health needs are our priority.  As part of our continuing mission to provide you with exceptional heart care, we have created designated Provider Care Teams.  These Care Teams include your primary Cardiologist (physician) and Advanced Practice Providers (APPs -  Physician Assistants and Nurse Practitioners) who all work together to provide you with the care you need, when you need it.  We recommend signing up for the patient portal called "MyChart".  Sign up information is provided on this After Visit Summary.  MyChart is used to connect with patients for Virtual Visits (Telemedicine).  Patients are able to view lab/test results, encounter notes, upcoming appointments, etc.  Non-urgent messages can be sent to your provider as well.   To learn more about what you can do with MyChart, go to [email or web address].    Your next appointment:    AS NEEDED  The format for your next appointment:   In Person  Provider:   You may see Glenetta Hew, MD or one of the following Advanced Practice Providers on your designated Care Team:    Rosaria Ferries, PA-C  Jory Sims, DNP, ANP  Cadence Kathlen Mody, NP    Other Instructions N/A

## 2019-06-06 NOTE — Progress Notes (Signed)
Primary Care Provider: Lujean Amel, MD Cardiologist: Glenetta Hew, MD Electrophysiologist: None  Pulmonologist: Dr. Chase Caller GI: Dr. Henrene Pastor Urology: Dr. Serita Butcher Neuro: Dr. Jannifer Franklin Psych: Dr. Kathrynn Humble  Clinic Note: Chief Complaint  Patient presents with  . Follow-up    Coronary CTA result    HPI:    Eduardo Fuentes is a 69 y.o. male who is being seen today for the evaluation of INCIDENTAL FINDING OF CAD ON CT SCAN at the request of Koirala, [name], MD.  Eduardo Fuentes was referred by Dr. Dorthy Cooler following his visit on February 16.  Patient had a CT scan done as part of his routine evaluation and follow-up for his interstitial lung disease.  CT scan was performed by pulmonary medicine.  CT scan showed incidental atherosclerosis of the coronary arteries and cardiology consultation was recommended.  The patient denied any symptoms of chest pain or pressure with rest or exertion. -> He has a history of hypertension and borderline lipids along with IPF.  He does have a family history of his father having an MI in his 62s leading to CABG but ended up dying later on life with prostate cancer. --> He is a lifetime non-smoker.  Recent Hospitalizations: None  Reviewed  CV studies:    The following studies were reviewed today: (if available, images/films reviewed: From Epic Chart or Care Everywhere) . By patient report, he had a stress test back in 2010 that was negative. . October 2020: CTA chest showed lung appearance compatible with interstitial lung disease-spectrum of findings considered -> probable UIP.  No significant progressive disease.  Also noted three-vessel coronary disease with left main involvement.  Patty Sermons  Interval History:   Eduardo Fuentes presents here today for follow-up of coronary calcium score.  Eduardo Fuentes is doing well.  He is not had any symptoms of chest tightness or pressure.  He has a Saint baseline exertional dyspnea that is consistent with his lung disease.   He still is doing his yard work and walking around the block several times.    Despite his dyspnea getting worse over the last few years, it has been a slow progression with no rapid drop.  He still has nighttime cough  CV Review of Symptoms (Summary): positive for - dyspnea on exertion and Intermittent cough with wheezing related to pulmonary disease. negative for - chest pain, edema, irregular heartbeat, orthopnea, palpitations, paroxysmal nocturnal dyspnea, rapid heart rate, shortness of breath or Syncope/near syncope, TIA/amaurosis fugax, claudication  The patient DOES NOT have symptoms concerning for COVID-19 infection (fever, chills, cough, or new shortness of breath).  The patient is practicing social distancing & Masking.   He has had Pfizer COVID-19 vaccine injections #1 and #2 (February 6 and March 3)   REVIEWED OF SYSTEMS   Review of Systems  Constitutional: Negative for malaise/fatigue.  HENT: Negative for congestion.   Respiratory: Positive for cough, shortness of breath (Baseline) and wheezing (Not very much).   Gastrointestinal: Negative for blood in stool and melena.  Genitourinary: Negative for hematuria.  Neurological: Negative for dizziness, weakness and headaches.  Psychiatric/Behavioral: Negative for memory loss. The patient does not have insomnia.   All other systems reviewed and are negative.  I have reviewed and (if needed) personally updated the patient's problem list, medications, allergies, past medical and surgical history, social and family history.   PAST MEDICAL HISTORY   Past Medical History:  Diagnosis Date  . Agatston CAC score, <100 04/2018  . Arthritis  NECK  . Cough    NON-PRODUCTIVE  . Depressive disorder 10/2011   follow by dr [facility] psychiatry; has anxiety and panic disorder as well.  . Diverticulosis   . Diverticulosis of colon    Followed by Dr. Henrene Pastor  . Essential hypertension   . Generalized anxiety disorder   . GERD  (gastroesophageal reflux disease)    WATCHES DIET  . Hemorrhoid   . Hiatal hernia    Describes a sliding hiatal hernia  . History of colon polyps   . History of kidney stones   . Idiopathic pulmonary fibrosis ([place])   . Microscopic hematuria   . Nocturia   . Panic attacks   . Prostate cancer ([place]) 07/2012   Dr. Arnell Sieving urology.  . Sinus infection   . Tinnitus    LEFT EAR -- CHRONIC    PAST SURGICAL HISTORY   Past Surgical History:  Procedure Laterality Date  . COLONOSCOPY    . ESOPHAGOGASTRODUODENOSCOPY    . EXTRACORPOREAL SHOCK WAVE LITHOTRIPSY  YRS AGO  . PROSTATE BIOPSY    . RADIOACTIVE SEED IMPLANT N/A 02/02/2013   Procedure: RADIOACTIVE SEED IMPLANT;  Surgeon: Molli Hazard, MD;  Location: [facility];  Service: Urology;  Laterality: N/A;  . TONSILLECTOMY AND ADENOIDECTOMY  AS CHILD    MEDICATIONS/ALLERGIES   Current Meds  Medication Sig  . clonazePAM (KLONOPIN) 1 MG tablet Take 1 tablet at 8 am, 1/2 tablet at 2 pm and 1 tablet at bedtime for anxiety. (Patient taking differently: TAKES 2-3 TABLETS EACH DAY.)  . gabapentin (NEURONTIN) 300 MG capsule Take 600 mg by mouth 2 (two) times daily.   . hydrochlorothiazide (HYDRODIURIL) 12.5 MG tablet Take 12.5 mg by mouth daily.  . mirtazapine (REMERON) 30 MG tablet Take 30 mg by mouth at bedtime.   . nortriptyline (PAMELOR) 50 MG capsule Take 50 mg by mouth at bedtime.   . OFEV 150 MG CAPS TAKE 1 CAPSULE BY MOUTH TWICE DAILY, TAKE 12 HOURS APART WITH FOOD.  . pantoprazole (PROTONIX) 40 MG tablet Take 1 tablet (40 mg total) by mouth daily.    Allergies  Allergen Reactions  . Aleve [Naproxen Sodium] Anaphylaxis and Other (See Comments)    GI UPSET Patient does not remember anaphylaxis  . Hyzaar [Losartan Potassium-Hctz] Other (See Comments)    Felt bad   . Lisinopril Cough  . Demerol [Meperidine] Rash         SOCIAL HISTORY/FAMILY HISTORY   Social History   Tobacco Use  .  Smoking status: Never Smoker  . Smokeless tobacco: Never Used  Substance Use Topics  . Alcohol use: No  . Drug use: No   Social History   Social History Narrative   Pt lives in single story home with is wife   12th grade education   Retired Programmer, systems from [employer] ;   Maybe 2-3 alcoholic beverages a month.  No caffeine.    OBJCTIVE -PE, EKG, labs   Wt Readings from Last 3 Encounters:  06/06/19 182 lb (82.6 kg)  05/06/19 182 lb (82.6 kg)  02/10/19 178 lb 6.4 oz (80.9 kg)    Physical Exam: BP 124/82   Pulse 62   Temp (!) 97.1 F (36.2 C)   Ht 5\' 9"  (1.753 m)   Wt 182 lb (82.6 kg)   SpO2 95%   BMI 26.88 kg/m  Physical Exam  Constitutional: He is oriented to person, place, and time. He appears well-developed and well-nourished. No  distress.  Relatively healthy-appearing.  Well-groomed.  HENT:  Head: Normocephalic and atraumatic.  Neck: No hepatojugular reflux and no JVD present. Carotid bruit is not present.  Cardiovascular: S1 normal and S2 normal.  No extrasystoles are present. PMI is not displaced.  Murmur: Soft 1/6 SEM at RUSB. Musculoskeletal:        General: No edema. Normal range of motion.     Cervical back: Normal range of motion and neck supple.  Neurological: He is alert and oriented to person, place, and time.  Psychiatric: He has a normal mood and affect. His behavior is normal. Judgment and thought content normal.  Vitals reviewed.    Adult ECG Report None checked  Recent Labs: November 18, 2018: TC 147, TG 130, HDL 30, LDL 91.  Hgb 15.2.  Cr 1.34.  K+ 4.5.  TSH 2.19  ASSESSMENT/PLAN    Problem List Items Addressed This Visit    Coronary artery calcification seen on CAT scan - Primary (Chronic)    Based on his coronary calcium score, I would not recommend any further evaluation with any stress testing.  Would simply continue to treat risk factors including blood pressure and lipids.  Target LDL should be less than  108 currently is on no medications. At this stage I would not recommend aspirin therapy.      Relevant Medications   hydrochlorothiazide (HYDRODIURIL) 12.5 MG tablet       COVID-19 Education: The signs and symptoms of COVID-19 were discussed with the patient and how to seek care for testing (follow up with PCP or arrange E-visit).   The importance of social distancing was discussed today.  I spent a total of 53minutes with the patient. >  50% of the time was spent in direct patient consultation.  Additional time spent with chart review  / charting (studies, outside notes, etc): 6 Total Time: 22 min   Current medicines are reviewed at length with the patient today.  (+/- concerns) none   Patient Instructions / Medication Changes & Studies & Tests Ordered   Patient Instructions  Medication Instructions:  NO CHANGES *If you need a refill on your cardiac medications before your next appointment, please call your pharmacy*   Lab Work: NOT NEEDED   Testing/Procedures: NOT NEEDED   Follow-Up: At [facility], you and your health needs are our priority.  As part of our continuing mission to provide you with exceptional heart care, we have created designated Provider Care Teams.  These Care Teams include your primary Cardiologist (physician) and Advanced Practice Providers (APPs -  Physician Assistants and Nurse Practitioners) who all work together to provide you with the care you need, when you need it.  We recommend signing up for the patient portal called "MyChart".  Sign up information is provided on this After Visit Summary.  MyChart is used to connect with patients for Virtual Visits (Telemedicine).  Patients are able to view lab/test results, encounter notes, upcoming appointments, etc.  Non-urgent messages can be sent to your provider as well.   To learn more about what you can do with MyChart, go to [email or web address].    Your next appointment:    AS NEEDED  The  format for your next appointment:   In Person  Provider:   You may see Glenetta Hew, MD or one of the following Advanced Practice Providers on your designated Care Team:    Rosaria Ferries, PA-C  Jory Sims, DNP, ANP  Cadence Kathlen Mody, NP    Other Instructions  N/A    Studies Ordered:   No orders of the defined types were placed in this encounter.    Glenetta Hew, M.D., M.S. Interventional Cardiologist   Pager # [phone] Phone # [phone] [address] [place], [place] [address]   Thank you for choosing [facility] at [facility]!!

## 2019-06-09 ENCOUNTER — Encounter: Payer: Self-pay | Admitting: Cardiology

## 2019-06-09 NOTE — Assessment & Plan Note (Signed)
Based on his coronary calcium score, I would not recommend any further evaluation with any stress testing.  Would simply continue to treat risk factors including blood pressure and lipids.  Target LDL should be less than 108 currently is on no medications. At this stage I would not recommend aspirin therapy.

## 2019-06-14 ENCOUNTER — Ambulatory Visit (AMBULATORY_SURGERY_CENTER): Payer: Self-pay

## 2019-06-14 ENCOUNTER — Other Ambulatory Visit: Payer: Self-pay

## 2019-06-14 VITALS — Temp 96.9°F | Ht 69.0 in | Wt 180.4 lb

## 2019-06-14 DIAGNOSIS — R110 Nausea: Secondary | ICD-10-CM

## 2019-06-14 DIAGNOSIS — R194 Change in bowel habit: Secondary | ICD-10-CM

## 2019-06-14 DIAGNOSIS — R109 Unspecified abdominal pain: Secondary | ICD-10-CM

## 2019-06-14 DIAGNOSIS — R197 Diarrhea, unspecified: Secondary | ICD-10-CM

## 2019-06-14 MED ORDER — PLENVU 140 G PO SOLR: 1.0000 | ORAL | 0 refills | Status: DC

## 2019-06-14 NOTE — Progress Notes (Signed)
No allergies to soy or egg Pt is not on blood thinners or diet pills Denies issues with sedation/intubation Denies atrial flutter/fib Denies constipation   Emmi instructions given to pt  Pt is aware of Covid safety and care partner requirements.  Care partner 93.6

## 2019-06-15 ENCOUNTER — Ambulatory Visit: Payer: PPO | Admitting: Internal Medicine

## 2019-06-15 ENCOUNTER — Telehealth: Payer: Self-pay | Admitting: Internal Medicine

## 2019-06-15 NOTE — Telephone Encounter (Signed)
Plenvu 154$ with [facility]-  Will get sample on 3rd flor Lot [id number] exp 02/2020- pt will pick up today or tomorrow  Lelan Pons PV

## 2019-06-23 ENCOUNTER — Encounter: Payer: Self-pay | Admitting: Internal Medicine

## 2019-06-28 ENCOUNTER — Encounter: Payer: Self-pay | Admitting: Internal Medicine

## 2019-06-28 ENCOUNTER — Ambulatory Visit (AMBULATORY_SURGERY_CENTER): Payer: PPO | Admitting: Internal Medicine

## 2019-06-28 ENCOUNTER — Other Ambulatory Visit: Payer: Self-pay

## 2019-06-28 VITALS — BP 108/75 | HR 78 | Temp 96.9°F | Resp 24 | Ht 69.0 in | Wt 180.4 lb

## 2019-06-28 DIAGNOSIS — R197 Diarrhea, unspecified: Secondary | ICD-10-CM

## 2019-06-28 DIAGNOSIS — R1030 Lower abdominal pain, unspecified: Secondary | ICD-10-CM

## 2019-06-28 DIAGNOSIS — R1310 Dysphagia, unspecified: Secondary | ICD-10-CM

## 2019-06-28 DIAGNOSIS — R634 Abnormal weight loss: Secondary | ICD-10-CM

## 2019-06-28 DIAGNOSIS — R109 Unspecified abdominal pain: Secondary | ICD-10-CM | POA: Diagnosis not present

## 2019-06-28 DIAGNOSIS — K5730 Diverticulosis of large intestine without perforation or abscess without bleeding: Secondary | ICD-10-CM

## 2019-06-28 DIAGNOSIS — R194 Change in bowel habit: Secondary | ICD-10-CM | POA: Diagnosis not present

## 2019-06-28 DIAGNOSIS — R110 Nausea: Secondary | ICD-10-CM | POA: Diagnosis not present

## 2019-06-28 DIAGNOSIS — K222 Esophageal obstruction: Secondary | ICD-10-CM

## 2019-06-28 MED ORDER — SODIUM CHLORIDE 0.9 % IV SOLN: 500.0000 mL | Freq: Once | INTRAVENOUS | Status: DC

## 2019-06-28 NOTE — Progress Notes (Signed)
pt tolerated well. VSS. awake and to recovery. Report given to RN. Bite block placed and removed with eases. Atraumatic.

## 2019-06-28 NOTE — Progress Notes (Signed)
Called to room to assist during endoscopic procedure.  Patient ID and intended procedure confirmed with present staff. Received instructions for my participation in the procedure from the performing physician.  

## 2019-06-28 NOTE — Op Note (Signed)
[facility] Patient Name: Eduardo Fuentes Procedure Date: 06/28/2019 8:44 AM MRN: [id number] Endoscopist: Docia Chuck. Henrene Pastor , MD Age: 69 Referring MD:  Date of Birth: 09-Mar-1950 Gender: Male Account #: [id number] Procedure:                Colonoscopy with biopsies Indications:              Lower abdominal pain, Change in bowel habits, loose                            stools Medicines:                Monitored Anesthesia Care Procedure:                Pre-Anesthesia Assessment:                           - Prior to the procedure, a History and Physical                            was performed, and patient medications and                            allergies were reviewed. The patient's tolerance of                            previous anesthesia was also reviewed. The risks                            and benefits of the procedure and the sedation                            options and risks were discussed with the patient.                            All questions were answered, and informed consent                            was obtained. Prior Anticoagulants: The patient has                            taken no previous anticoagulant or antiplatelet                            agents. ASA Grade Assessment: II - A patient with                            mild systemic disease. After reviewing the risks                            and benefits, the patient was deemed in                            satisfactory condition to undergo the procedure.  After obtaining informed consent, the colonoscope                            was passed under direct vision. Throughout the                            procedure, the patient's blood pressure, pulse, and                            oxygen saturations were monitored continuously. The                            Colonoscope was introduced through the anus and                            advanced to the the cecum, identified by                            appendiceal orifice and ileocecal valve. The                            terminal ileum, ileocecal valve, appendiceal                            orifice, and rectum were photographed. The quality                            of the bowel preparation was excellent. The                            colonoscopy was performed without difficulty. The                            patient tolerated the procedure well. The bowel                            preparation used was SUPREP via split dose                            instruction. Scope In: 8:58:02 AM Scope Out: 9:09:46 AM Scope Withdrawal Time: 0 hours 8 minutes 29 seconds  Total Procedure Duration: 0 hours 11 minutes 44 seconds  Findings:                 The terminal ileum appeared normal.                           Multiple diverticula were found in the sigmoid                            colon. 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  The entire examined colon appeared otherwise normal                            on direct and retroflexion views. Biopsies for                            histology were taken with a cold forceps from the                            entire colon for evaluation of microscopic colitis. Complications:            No immediate complications. Estimated blood loss:                            None. Estimated Blood Loss:     Estimated blood loss: none. Impression:               - The examined portion of the ileum was normal.                           - Diverticulosis in the sigmoid colon.                           - Radiation proctitis                           - The entire examined colon is otherwise normal on                            direct and retroflexion views. Recommendation:           - Repeat colonoscopy in 10 years for screening                            purposes.                           - Patient has a contact  number available for                            emergencies. The signs and symptoms of potential                            delayed complications were discussed with the                            patient. Return to normal activities tomorrow.                            Written discharge instructions were provided to the                            patient.                           - Resume previous diet.                           -  Continue present medications.                           - Await pathology results. Docia Chuck. Henrene Pastor, MD 06/28/2019 9:25:07 AM This report has been signed electronically.

## 2019-06-28 NOTE — Op Note (Signed)
[facility] Patient Name: Eduardo Fuentes Procedure Date: 06/28/2019 8:44 AM MRN: [id number] Endoscopist: Docia Chuck. Henrene Pastor , MD Age: 69 Referring MD:  Date of Birth: 1950-09-03 Gender: Male Account #: [id number] Procedure:                Upper GI endoscopy Indications:              Lower abdominal pain, Nausea Medicines:                Monitored Anesthesia Care Procedure:                Pre-Anesthesia Assessment:                           - Prior to the procedure, a History and Physical                            was performed, and patient medications and                            allergies were reviewed. The patient's tolerance of                            previous anesthesia was also reviewed. The risks                            and benefits of the procedure and the sedation                            options and risks were discussed with the patient.                            All questions were answered, and informed consent                            was obtained. Prior Anticoagulants: The patient has                            taken no previous anticoagulant or antiplatelet                            agents. ASA Grade Assessment: II - A patient with                            mild systemic disease. After reviewing the risks                            and benefits, the patient was deemed in                            satisfactory condition to undergo the procedure.                           After obtaining informed consent, the endoscope was  passed under direct vision. Throughout the                            procedure, the patient's blood pressure, pulse, and                            oxygen saturations were monitored continuously. The                            Endoscope was introduced through the mouth, and                            advanced to the second part of duodenum. The upper                            GI endoscopy was accomplished  without difficulty.                            The patient tolerated the procedure well. Scope In: Scope Out: Findings:                 The esophagus was normal.                           The stomach was normal. Small hiatal hernia present.                           The examined duodenum was normal.                           The cardia and gastric fundus were normal on                            retroflexion. Complications:            No immediate complications. Estimated Blood Loss:     Estimated blood loss: none. Impression:               - Normal esophagus.                           - Normal stomach.                           - Normal examined duodenum.                           - No specimens collected. Recommendation:           - Patient has a contact number available for                            emergencies. The signs and symptoms of potential                            delayed complications were discussed with the  patient. Return to normal activities tomorrow.                            Written discharge instructions were provided to the                            patient.                           - Resume previous diet.                           - Continue present medications.                           - If the patient would like, prescribe Zofran 4 mg;                            #60; take 1 every 4 hours as needed for nausea; 1                            refill Kodi Steil N. Henrene Pastor, MD 06/28/2019 9:27:36 AM This report has been signed electronically.

## 2019-06-28 NOTE — Progress Notes (Signed)
Temp by JB Vitals by CW  Pt's states no medical or surgical changes since previsit or office visit.  

## 2019-06-28 NOTE — Patient Instructions (Signed)
Diverticulosis in the colon and radiation proctitis. Biopsies taken. Await pathology for final recommendations.  Handouts on findings given to patient.    YOU HAD AN ENDOSCOPIC PROCEDURE TODAY AT [facility]:   Refer to the procedure report that was given to you for any specific questions about what was found during the examination.  If the procedure report does not answer your questions, please call your gastroenterologist to clarify.  If you requested that your care partner not be given the details of your procedure findings, then the procedure report has been included in a sealed envelope for you to review at your convenience later.  YOU SHOULD EXPECT: Some feelings of bloating in the abdomen. Passage of more gas than usual.  Walking can help get rid of the air that was put into your GI tract during the procedure and reduce the bloating. If you had a lower endoscopy (such as a colonoscopy or flexible sigmoidoscopy) you may notice spotting of blood in your stool or on the toilet paper. If you underwent a bowel prep for your procedure, you may not have a normal bowel movement for a few days.  Please Note:  You might notice some irritation and congestion in your nose or some drainage.  This is from the oxygen used during your procedure.  There is no need for concern and it should clear up in a day or so.  SYMPTOMS TO REPORT IMMEDIATELY:   Following lower endoscopy (colonoscopy or flexible sigmoidoscopy):  Excessive amounts of blood in the stool  Significant tenderness or worsening of abdominal pains  Swelling of the abdomen that is new, acute  Fever of 100F or higher   Following upper endoscopy (EGD)  Vomiting of blood or coffee ground material  New chest pain or pain under the shoulder blades  Painful or persistently difficult swallowing  New shortness of breath  Fever of 100F or higher  Black, tarry-looking stools  For urgent or emergent issues, a gastroenterologist  can be reached at any hour by calling [phone]. Do not use MyChart messaging for urgent concerns.    DIET:  We do recommend a small meal at first, but then you may proceed to your regular diet.  Drink plenty of fluids but you should avoid alcoholic beverages for 24 hours.  ACTIVITY:  You should plan to take it easy for the rest of today and you should NOT DRIVE or use heavy machinery until tomorrow (because of the sedation medicines used during the test).    FOLLOW UP: Our staff will call the number listed on your records 48-72 hours following your procedure to check on you and address any questions or concerns that you may have regarding the information given to you following your procedure. If we do not reach you, we will leave a message.  We will attempt to reach you two times.  During this call, we will ask if you have developed any symptoms of COVID 19. If you develop any symptoms (ie: fever, flu-like symptoms, shortness of breath, cough etc.) before then, please call [phone].  If you test positive for Covid 19 in the 2 weeks post procedure, please call and report this information to [place].    If any biopsies were taken you will be contacted by phone or by letter within the next 1-3 weeks.  Please call us at [phone] if you have not heard about the biopsies in 3 weeks.    SIGNATURES/CONFIDENTIALITY: You and/or your care partner have  signed paperwork which will be entered into your electronic medical record.  These signatures attest to the fact that that the information above on your After Visit Summary has been reviewed and is understood.  Full responsibility of the confidentiality of this discharge information lies with you and/or your care-partner.

## 2019-06-30 ENCOUNTER — Telehealth: Payer: Self-pay

## 2019-06-30 ENCOUNTER — Encounter: Payer: Self-pay | Admitting: Internal Medicine

## 2019-06-30 NOTE — Telephone Encounter (Signed)
  Follow up Call-  Call back number 06/28/2019 01/07/2018  Post procedure Call Back phone  # [phone]  Permission to leave phone message Yes Yes  Some recent data might be hidden     Patient questions:  Do you have a fever, pain , or abdominal swelling? No. Pain Score  0 *  Have you tolerated food without any problems? Yes.    Have you been able to return to your normal activities? Yes.    Do you have any questions about your discharge instructions: Diet   No. Medications  No. Follow up visit  No.  Do you have questions or concerns about your Care? No.  Actions: * If pain score is 4 or above: No action needed, pain <4.  1. Have you developed a fever since your procedure? no  2.   Have you had an respiratory symptoms (SOB or cough) since your procedure? no  3.   Have you tested positive for COVID 19 since your procedure no  4.   Have you had any family members/close contacts diagnosed with the COVID 19 since your procedure?  no   If yes to any of these questions please route to Joylene John, RN and Erenest Rasher, RN

## 2019-07-15 ENCOUNTER — Other Ambulatory Visit (HOSPITAL_COMMUNITY)
Admission: RE | Admit: 2019-07-15 | Discharge: 2019-07-15 | Disposition: A | Discharge status: Home / Self Care | Payer: PPO | Source: Ambulatory Visit | Attending: Pulmonary Disease | Admitting: Pulmonary Disease

## 2019-07-15 DIAGNOSIS — Z20822 Contact with and (suspected) exposure to covid-19: Secondary | ICD-10-CM | POA: Insufficient documentation

## 2019-07-15 DIAGNOSIS — Z01812 Encounter for preprocedural laboratory examination: Secondary | ICD-10-CM | POA: Insufficient documentation

## 2019-07-15 LAB — SARS CORONAVIRUS 2 (TAT 6-24 HRS): SARS Coronavirus 2: NEGATIVE

## 2019-07-18 ENCOUNTER — Ambulatory Visit (INDEPENDENT_AMBULATORY_CARE_PROVIDER_SITE_OTHER): Payer: PPO | Admitting: Internal Medicine

## 2019-07-18 ENCOUNTER — Other Ambulatory Visit: Payer: Self-pay

## 2019-07-18 DIAGNOSIS — J84112 Idiopathic pulmonary fibrosis: Secondary | ICD-10-CM

## 2019-07-18 LAB — PULMONARY FUNCTION TEST
DL/VA % pred: 83 %
DL/VA: 3.41 ml/min/mmHg/L
DLCO cor % pred: 50 %
DLCO cor: 12.67 ml/min/mmHg
DLCO unc % pred: 50 %
DLCO unc: 12.67 ml/min/mmHg
FEF 25-75 Pre: 2.23 L/sec
FEF2575-%Pred-Pre: 91 %
FEV1-%Pred-Pre: 69 %
FEV1-Pre: 2.2 L
FEV1FVC-%Pred-Pre: 112 %
FEV6-%Pred-Pre: 65 %
FEV6-Pre: 2.65 L
FEV6FVC-%Pred-Pre: 105 %
FVC-%Pred-Pre: 61 %
FVC-Pre: 2.65 L
Pre FEV1/FVC ratio: 83 %
Pre FEV6/FVC Ratio: 100 %

## 2019-07-18 NOTE — Progress Notes (Signed)
Spirometry and Dlco done today. 

## 2019-07-18 NOTE — Progress Notes (Signed)
SARS-CoV-2 test is negative.  This is good news.  Proceed forward with work-up as planned.  Billey Wojciak, FNP 

## 2019-07-20 ENCOUNTER — Other Ambulatory Visit: Payer: Self-pay

## 2019-07-20 ENCOUNTER — Other Ambulatory Visit (INDEPENDENT_AMBULATORY_CARE_PROVIDER_SITE_OTHER): Payer: PPO

## 2019-07-20 ENCOUNTER — Other Ambulatory Visit: Payer: PPO

## 2019-07-20 ENCOUNTER — Telehealth: Payer: Self-pay | Admitting: Pharmacy Technician

## 2019-07-20 ENCOUNTER — Encounter: Payer: Self-pay | Admitting: Internal Medicine

## 2019-07-20 ENCOUNTER — Ambulatory Visit: Payer: PPO | Admitting: Internal Medicine

## 2019-07-20 VITALS — BP 124/74 | HR 71 | Temp 97.7°F | Ht 69.0 in | Wt 181.8 lb

## 2019-07-20 DIAGNOSIS — T50905A Adverse effect of unspecified drugs, medicaments and biological substances, initial encounter: Secondary | ICD-10-CM | POA: Diagnosis not present

## 2019-07-20 DIAGNOSIS — J84112 Idiopathic pulmonary fibrosis: Secondary | ICD-10-CM

## 2019-07-20 DIAGNOSIS — Z5181 Encounter for therapeutic drug level monitoring: Secondary | ICD-10-CM

## 2019-07-20 DIAGNOSIS — R634 Abnormal weight loss: Secondary | ICD-10-CM | POA: Diagnosis not present

## 2019-07-20 DIAGNOSIS — R110 Nausea: Secondary | ICD-10-CM | POA: Diagnosis not present

## 2019-07-20 LAB — HEPATIC FUNCTION PANEL
ALT: 15 U/L (ref 0–53)
AST: 21 U/L (ref 0–37)
Albumin: 4 g/dL (ref 3.5–5.2)
Alkaline Phosphatase: 88 U/L (ref 39–117)
Bilirubin, Direct: 0.1 mg/dL (ref 0.0–0.3)
Total Bilirubin: 0.5 mg/dL (ref 0.2–1.2)
Total Protein: 6.9 g/dL (ref 6.0–8.3)

## 2019-07-20 NOTE — Patient Instructions (Addendum)
ICD-10-CM   1. IPF (idiopathic pulmonary fibrosis) ([place])  J84.112   2. Encounter for therapeutic drug monitoring  Z51.81   3. Drug-induced weight loss  R63.4    T50.905A   4. Nausea  R11.0     You are having progressive disease since November 2020. Roughly around 8% loss in lung function He also having side effects of nausea and weight loss from the nintedanib  Plan -Check liver function test today -Stop nintedanib/Ofev -Initiate paperwork today for pirfenidone/Esbriet -Set up a meeting with pharmacist Amber Yopp to get counseling on pirfenidone/Esbriet  Follow-up -Set up a meeting with pharmacist Amber Yopp in the next 2 weeks -In 4-6 weeks nurse practitioner visit for ILD follow-up and tolerance to pirfenidone -In 8-12 weeks do spirometry with DLCO -Return to see Dr. Chase Caller for a 30-minute visit in 8 to 12 weeks

## 2019-07-20 NOTE — Telephone Encounter (Signed)
Submitted a Prior Authorization request to [facility]  for ESBRIET Capsules via Cover My Meds. Will update once we receive a response.  (KeyMarlowe Aschoff) - [id number]

## 2019-07-20 NOTE — Progress Notes (Signed)
Patient identification verified, results of recent hepatic function panel reviewed. Results were normal. Patient verbalized understanding of results.

## 2019-07-20 NOTE — Progress Notes (Signed)
Primary Care Provider: Lujean Amel, MD Cardiologist: Glenetta Hew, MD Electrophysiologist: None  Pulmonologist: Dr. Chase Caller GI: Dr. Henrene Pastor Urology: Dr. Serita Butcher Neuro: Dr. Jannifer Franklin Psych: Dr. Carolee Rota 01/29/2018  Chief Complaint  Patient presents with  . Pulmonary Consult    self referral, abnormal CT, ? ILD from CT scan, SOB with exertion     Eduardo Fuentes presents self-referral for interstitial lung disease.  His wife Eduardo Fuentes used to be seen by me some years ago when she had surgical lung biopsy for ruling out interstitial lung disease which we did.  Patient is a non-smoker but has long suffered from acid reflux and esophageal stenosis.  He also has a sliding hiatal hernia.  He says since spring 2019 he has had insidious onset of shortness of breath that is progressive associated with some cough.  During this time he is also had worsening endogenous depression.  He was seeing a psychiatrist multiple antidepressants have failed.  Electroconvulsive therapy has been recommended and he had one on January 27, 2018 2 days ago.  During this time a chest x-ray was done as part of pre-electroconvulsive therapy evaluation and this suggested interstitial lung disease.  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  I agree with the radiologist.  The some amount of mediastinal adenopathy that is felt to be reactive.  There is also a sliding hiatal hernia.  It is described that these findings are new compared to 2014 chest x-ray.  At this point in time walking desaturation test he did not desaturate but dropped 4 points and pulse ox    [place] Integrated Comprehensive ILD Questionnaire  Symptoms: He reports shortness of breath onset gradually for the last 1 year.  It is getting worse with time.  He does have episodic dyspnea.  When he does shopping it is level 1.  When he walks on a level ground with  others of his age at this level 1 walking up stairs at this level 1 but walking at his own pace at this level 2 and taking a shower at level 3.  He does have associated arthralgia and cough.  He is not sure when the cough started.  It is present at all positions since it started it is the same as moderate in intensity.  He does cough at night.  He does bring up phlegm which is clear in color.  It gets worse when he lies down and it affects his voice and he does clear his throat.  He does not feel a tickle.  There is no wheezing.   Past Medical History : This is positive for major depression but he circled no for asthma, COPD.  Surgical no for heart failure.  Surgical no for collagen vascular disease with sleep apnea.  He did not answer for acid reflux which he does have..  Denies diabetes or thyroid disease or heart disease or pulmonary embolism or pneumonia   ROS:  .  Positive for difficulty swallowing due to his esophageal stricture history.  Does have nausea and acid reflux and snoring.  Otherwise no vomiting is positive for soreness across the body after electroconvulsive therapy 2 days ago.   FAMILY HISTORY of LUNG DISEASE: Denies family history of any pulmonary fibrosis COPD or asthma sarcoidosis   EXPOSURE HISTORY: Denies smoking cigarettes or tobacco or marijuana or cocaine.  No intravenous drug abuse.   HOME and HOBBY  DETAILS : He did not list the age of the home but is lived in this home for 25 [place].  There is no mold in the house of mildew.  The house is not damp.  Does not use a CPAP mask.  No nebulizer machine.  No use of Jacuzzi.  Nose fountain in the house.  No pet birds or gerbils.  Does not use feather pillows.  He is not sure if there is mold in the [facility] duct system.  Does not play wind instruments.  He occasionally mows the lawn   OCCUPATIONAL HISTORY (122 questions) : Positive for social gardening.  Did report that he is used feather pillows and blankets in  the past but otherwise negative including working in a dusty environment   Ko [place] (27 items): Positive for cancer chemotherapy but he does not know the details      OV 03/16/2018  Subjective:  Patient ID: Eduardo Fuentes, male , DOB: [birth year] , age 69 y.o. , MRN: [place]:[id number] , ADDRESS: [place] [address]   03/16/2018 -   Chief Complaint  Patient presents with  . Follow-up    Pt started OFEV 02/27/2018.  Pt states he has been doing okay since last visit. Denies any current complaints with the OFEV. Pt states he does have SOB which is mainy with activities.   IPF [clinical diagnosis based on classic UIP scan negative serology and age and male gender; diagnosis given February 11, 2017]  HPI Eduardo Fuentes 69 y.o. -IPF follow-up.  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  He was started on nintedanib.  He started this on February 27, 2018.  Suffice tolerating it well without any problems.  He is debating about starting pulmonary rehabilitation.  Mostly does not want to pay $20 a week for this.  The other alternative is that he can go to [facility] [employer] and exercise by himself without any co-pay but there is no trainer.  I asked him to look at rehab as a short-term thing with the help of a trainer and the cost of a trainer being $20 a week.  He then opened up to the idea of attending pulmonary rehabilitation.  He still and his wife both had questions about IPF and etiology.  Be on sliding hiatal hernia but not able to discover anything else.  His reflux is better controlled now with PPI.  In terms of her shortness of breath this is stable.  In terms of his cough this is stable.  He continues to deal with depression but is not suicidal or homicidal.  His walking desaturation test today shows stability.  Wife wanted some education material about IPF and nintedanib.         OV 04/29/2018  Subjective:  Patient ID: Eduardo Fuentes, male , DOB:  [birth year] , age 87 y.o. , MRN: [place]:[id number] , ADDRESS: [place] [address]   04/29/2018 -   Chief Complaint  Patient presents with  . Follow-up    Pt states he has had some weakness going on which he states could be due to not eating enough with the OFEV. Pt also has had some loose stools as well as nausea.    IPF [clinical diagnosis based on classic UIP scan negative serology and age and male gender; diagnosis given February 11, 2017 and on Ofev since 02/27/2018   HPI JAMILL PROCHNOW 69 y.o. -presents for follow-up  of his IPF.  He is now doing nintedanib for 2 months.  Overall stable.  Overall tolerating nintedanib really well.  He had some low appetite.  There.  Some nausea he had and that yesterday he had some diarrhea after eating pizza.  He thinks it is a pizza that caused the diarrhea not the nintedanib.  In terms of his dyspnea he stable quality of life is stable.  He said he is doing well with pulmonary rehabilitation.  Maintain his pulse ox 90% or so.  He is asking if the nintedanib is helping him in terms of disease stability.  Discussed that this might not be known fully in an individual.  His mood is better   Results for BRACE, LETOURNEAU (MRN [place]:[id number]) as of 03/16/2018 14:30  Ref. Range 01/29/2018 12:42  ASPERGILLUS FUMIGATUS Latest Ref Range: NEGATIVE  NEGATIVE  [place] Serum Latest Ref Range: NEGATIVE  NEGATIVE  Anti Nuclear Antibody(ANA) Latest Ref Range: NEGATIVE  POSITIVE (A)  ANA Pattern 1 Unknown Nuclear, Homogeneous (A)  ANA Titer 1 Latest Units: titer 0000000 (H)  Cyclic Citrullin Peptide Ab Latest Units: UNITS <16  RA Latex Turbid. Latest Ref Range: <14 IU/mL <14   ROS - per HPI  Results for NAUTICA, KREUTZER (MRN [place]:[id number]) as of 03/16/2018 14:54  Ref. Range 03/08/2018 11:50  AST Latest Ref Range: 0 - 37 U/L 23  ALT Latest Ref Range: 0 - 53 U/L 15  Total Protein Latest Ref Range: 6.0 - 8.3 g/dL 7.2    ROS - per HPI    OV 04/01/2019  Subjective:    Patient ID: Eduardo Fuentes, male , DOB: Mar 17, 1950 , age 70 y.o. , MRN: [place]:[id number] , ADDRESS: [place] [id number]   04/01/2019 -   Chief Complaint  Patient presents with  . Follow-up    Pt states hes been feeling ok but has had some nausea after breakfast and morning nap. Very little cough. SOB on exertion. Pt denies any wheezing, fever, or chills.    IPF [clinical diagnosis based on classic UIP scan negative serology and age and male gender; diagnosis given February 11, 2017 and on Ofev since 02/27/2018  HPI JUANLUIS KERO 69 y.o. -on this telephone visit reports nausea with nintedanib in the mornings.  He has a very light breakfast that has applesauce.  He does not eat much then after the breakfast at 9:00 he has some nausea that is mild to moderate in intensity.  Documented below with a question elicited a few days ago.  In terms of shortness of breath he is stable.  He is asking about exercising.  Otherwise he feels good.  At night only occasionally has nausea.  He has enough refills of Zofran but is not taking it.  I saw him a few days ago when he was in the office with his wife and at that time unofficial lung exam showed stable crackles and he looked well.  There is no evidence of ongoing depression or cognitive ability.  His memory is very sharp.     IMPRESSION: 1. The appearance of the lungs is compatible with interstitial lung disease, with a spectrum of findings considered probable usual interstitial pneumonia (UIP) per current ATS guidelines. No significant progression of disease compared to the prior study. 2. Aortic atherosclerosis, in addition to left main and 3 vessel coronary artery disease. Please note that although the presence of coronary artery calcium documents the presence of coronary artery disease, the severity of  this disease and any potential stenosis cannot be assessed on this non-gated CT examination. Assessment for potential risk factor  modification, dietary therapy or pharmacologic therapy may be warranted, if clinically indicated.  Aortic Atherosclerosis (ICD10-I70.0).   Electronically Signed   By: Vinnie Langton M.D.   On: 12/08/2018 15:10 ROS - per HPI  Results for ROYCE, SCHEIDER (MRN [place]:[id number]) as of 04/01/2019 09:43  Ref. Range 01/17/2019 12:25  AST Latest Ref Range: 0 - 37 U/L 27  ALT Latest Ref Range: 0 - 53 U/L 21     OV 07/20/2019  Subjective:  Patient ID: Eduardo Fuentes, male , DOB: 1950-05-29 , age 22 y.o. , MRN: [place]:[id number] , ADDRESS: [place] [id number]  IPF [clinical diagnosis based on classic UIP scan negative serology and age and male gender; diagnosis given February 11, 2017 and on Ofev since 02/27/2018   07/20/2019 -   Chief Complaint  Patient presents with  . Follow-up    pt is here to go over pft results. pt states nausea with the ofev mes     HPI OMARI KINNARD 69 y.o. -was stable at the time of last visit in February 2021. Because of coronary artery calcification I referred him to cardiology. Reviewed Dr. Shanon Brow Harding's notes. No indication for stress test. At that visit he had some nausea that was moderate. He then saw gastroenterologist Dr. Scarlette Shorts. Early in May 2021 this month he underwent upper endoscopy and lower endoscopy. I have reviewed the notes and visualize the results. These are normal. The nausea is felt to be due to drug. He is got moderate nausea although the dyspnea itself is same. He feels it is because of nintedanib. In fact he is also lost weight over 10 pounds since the last 1 year. He is not sure why. Nevertheless he is quite active. From a dyspnea disease standpoint he feels the same but his pulmonary function test shows decline of at least 6% and FVC and 13% and DLCO and since November 2020. He is worried about this.    SYMPTOM SCALE - ILD 03/28/2019  07/20/2019   O2 use ra ra  Shortness of Breath 0 -> 5 scale with 5 being worst (score 6 If  unable to do)   At rest 0 0  Simple tasks - showers, clothes change, eating, shaving 0 0  Household (dishes, doing bed, laundry) 1 1  Shopping 1 0  Walking level at own pace 1 1  Walking up Stairs 2 2  Total (30-36) Dyspnea Score 5 4  How bad is your cough? 1 1  How bad is your fatigue 1 1  How bad is nausea 3 2  How bad is vomiting?  0 0  How bad is diarrhea? 1 1  How bad is anxiety? 1 1  How bad is depression 1 0       01/29/2018  03/16/2018 190# 04/29/2018 193# 07/20/2019 180#  O2 used Room air rooom air Room air   Number laps completed 3 3 3    Comments about pace 250 feet x 3 laps on POD B at [employer] pace Normal pace   Resting Pulse Ox/HR 100% and 89/min 100% and 110/min 96% and 93/min 97% and 93/min  Final Pulse Ox/HR 96% and 109/min 97% and 118/min 91% and 107/min 90% and 110/min  Desaturated </= 88% no no no   Desaturated <= 3% points Yes, 4poin Yes, 3 poitns Yes, 5 points Yes, 7  points  Got Tachycardic >/= 90/min yes yes yes   Symptoms at end of test no Mild dyspnea Mild dyspnea   Miscellaneous comments no x x Likely worse    Results for MARQUEE, WAVRA (MRN [place]:[id number]) as of 07/20/2019 10:32  Ref. Range 02/11/2018 13:52 08/25/2018 11:51 01/17/2019 10:59 07/18/2019 09:01  FVC-Pre Latest Units: L 2.90 2.69 2.82 2.65  FVC-%Pred-Pre Latest Units: % 67 62 65 61   Results for VERNEST, NANO (MRN [place]:[id number]) as of 07/20/2019 10:32  Ref. Range 02/11/2018 13:52 08/25/2018 11:51 01/17/2019 10:59 07/18/2019 09:01  DLCO unc Latest Units: ml/min/mmHg  14.69 14.84 12.67  DLCO unc % pred Latest Units: %  57 58 50   ROS - per HPI     has a past medical history of Agatston CAC score, <100 (04/2018), Arthritis, Cough, Depressive disorder (10/2011), Diverticulosis, Diverticulosis of colon, Essential hypertension, Generalized anxiety disorder, GERD (gastroesophageal reflux disease), Hemorrhoid, Hiatal hernia, History of colon polyps, History of kidney stones, Idiopathic  pulmonary fibrosis ([place]), Microscopic hematuria, Nocturia, Panic attacks, Prostate cancer ([place]) (07/2012), Sinus infection, and Tinnitus.   reports that he has never smoked. He has never used smokeless tobacco.  Past Surgical History:  Procedure Laterality Date  . COLONOSCOPY  03/04/2013  . ESOPHAGOGASTRODUODENOSCOPY    . EXTRACORPOREAL SHOCK WAVE LITHOTRIPSY  YRS AGO  . PROSTATE BIOPSY    . RADIOACTIVE SEED IMPLANT N/A 02/02/2013   Procedure: RADIOACTIVE SEED IMPLANT;  Surgeon: Molli Hazard, MD;  Location: [facility];  Service: Urology;  Laterality: N/A;  . TONSILLECTOMY AND ADENOIDECTOMY  AS CHILD  . UPPER GASTROINTESTINAL ENDOSCOPY  2019    Allergies  Allergen Reactions  . Aleve [Naproxen Sodium] Anaphylaxis and Other (See Comments)    GI UPSET Patient does not remember anaphylaxis  . Hyzaar [Losartan Potassium-Hctz] Other (See Comments)    Felt bad   . Lisinopril Cough  . Demerol [Meperidine] Rash         Immunization History  Administered Date(s) Administered  . Fluad Quad(high Dose 65+) 12/30/2018  . Influenza Inj Mdck Quad Pf 11/09/2017  . PFIZER SARS-COV-2 Vaccination 04/02/2019, 04/27/2019  . Pneumococcal Conjugate-13 10/26/2015  . Pneumococcal Polysaccharide-23 10/29/2016  . Zoster Recombinat (Shingrix) 12/03/2018, 02/01/2019    Family History  Problem Relation Age of Onset  . Dementia Mother   . Anxiety disorder Mother   . Alzheimer's disease Mother   . Bone cancer Father        Metastatic prostate  . Cancer Father        prostate  . Heart attack Father 56       Referred for CABG  . Anxiety disorder Sister   . Colon cancer Neg Hx   . Esophageal cancer Neg Hx   . Rectal cancer Neg Hx   . Stomach cancer Neg Hx   . Colon polyps Neg Hx      Current Outpatient Medications:  .  clonazePAM (KLONOPIN) 1 MG tablet, Take 1 tablet at 8 am, 1/2 tablet at 2 pm and 1 tablet at bedtime for anxiety. (Patient taking differently: TAKES  2-3 TABLETS EACH DAY.), Disp: 75 tablet, Rfl: 0 .  gabapentin (NEURONTIN) 300 MG capsule, Take 600 mg by mouth 2 (two) times daily. , Disp: , Rfl:  .  hydrochlorothiazide (HYDRODIURIL) 12.5 MG tablet, Take 12.5 mg by mouth daily., Disp: , Rfl:  .  mirtazapine (REMERON) 30 MG tablet, Take 30 mg by mouth at bedtime. , Disp: , Rfl: 4 .  nortriptyline (  PAMELOR) 50 MG capsule, Take 50 mg by mouth at bedtime. , Disp: , Rfl:  .  OFEV 150 MG CAPS, TAKE 1 CAPSULE BY MOUTH TWICE DAILY, TAKE 12 HOURS APART WITH FOOD., Disp: 60 capsule, Rfl: 10 .  pantoprazole (PROTONIX) 40 MG tablet, Take 1 tablet (40 mg total) by mouth daily., Disp: 90 tablet, Rfl: 3  Current Facility-Administered Medications:  .  0.9 %  sodium chloride infusion, 500 mL, Intravenous, Once, Irene Shipper, MD      Objective:   Vitals:   07/20/19 1028  BP: 124/74  Pulse: 71  Temp: 97.7 F (36.5 C)  TempSrc: Temporal  SpO2: 94%  Weight: 181 lb 12.8 oz (82.5 kg)  Height: 5\' 9"  (1.753 m)    Estimated body mass index is 26.85 kg/m as calculated from the following:   Height as of this encounter: 5\' 9"  (1.753 m).   Weight as of this encounter: 181 lb 12.8 oz (82.5 kg).  @WEIGHTCHANGE @  [employer]   07/20/19 1028  Weight: 181 lb 12.8 oz (82.5 kg)     Physical Exam Pleasant male alert and oriented x3. Speech is normal. Bilateral Velcro crackles craniocaudal gradient. Halfway up. Mild clubbing present no stigmata of connective tissue disease. Normal heart sounds abdomen soft. No cyanosis no clubbing no edema.        Assessment:       ICD-10-CM   1. IPF (idiopathic pulmonary fibrosis) (HCC)  J84.112 Hepatic function panel  2. Encounter for therapeutic drug monitoring  Z51.81 Hepatic function panel  3. Drug-induced weight loss  R63.4 Hepatic function panel   T50.905A   4. Nausea  R11.0 Hepatic function panel       Plan:     Patient Instructions     ICD-10-CM   1. IPF (idiopathic pulmonary fibrosis) ([place])   J84.112   2. Encounter for therapeutic drug monitoring  Z51.81   3. Drug-induced weight loss  R63.4    T50.905A   4. Nausea  R11.0     You are having progressive disease since November 2020. Roughly around 8% loss in lung function He also having side effects of nausea and weight loss from the nintedanib  Plan -Check liver function test today -Stop nintedanib/Ofev -Initiate paperwork today for pirfenidone/Esbriet -Set up a meeting with pharmacist Amber Yopp to get counseling on pirfenidone/Esbriet  Follow-up -Set up a meeting with pharmacist Amber Yopp in the next 2 weeks -In 4-6 weeks nurse practitioner visit for ILD follow-up and tolerance to pirfenidone -In 8-12 weeks do spirometry with DLCO -Return to see Dr. Chase Caller for a 30-minute visit in 8 to 53 weeks     SIGNATURE    Dr. Brand Males, M.D., F.C.C.P,  Pulmonary and Critical Care Medicine Staff Physician, [place] Director - Interstitial Lung Disease  Program  Pulmonary [place] [facility] [place], [place], [address]  Pager: [phone], If no answer or between  15:00h - 7:00h: call [phone] Telephone: [phone]  11:26 AM 07/20/2019

## 2019-07-20 NOTE — Progress Notes (Signed)
Lft normal

## 2019-07-21 NOTE — Telephone Encounter (Signed)
Spoke to patient, he scheduled Esbriet New start for 08/08/19, but would like to do earlier televisit if available. Patient does not have any pharmacy restrictions.  Patient has an active [place] through 01/24/20  Pharmacy Card [id number] [phone] [id number]

## 2019-07-21 NOTE — Telephone Encounter (Signed)
Received notification from [facility] regarding a prior authorization for ESBRIET. Authorization has been APPROVED from 07/20/19 to 07/19/20.   Authorization # [id number]  Ran test claim, patient's copay is $495.51. Will look into patient assistance options.

## 2019-07-22 MED ORDER — ESBRIET 267 MG PO TABS
ORAL_TABLET | ORAL | 0 refills | Status: DC
Start: 2019-07-22 — End: 2019-08-19

## 2019-07-22 NOTE — Telephone Encounter (Signed)
Called to discuss Esbriet and specialty pharmacy process.  Patient unavailable and requested return call at [place], PharmD, [place], CPP Clinical Specialty Pharmacist (Rheumatology and Pulmonology)  07/22/2019 12:57 PM

## 2019-07-22 NOTE — Telephone Encounter (Signed)
Called patient to notify of approval and discuss Esbriet.  Patient was previously on Ofev but switched to Esbriet due to declining lung function.  Patient counseled on purpose, proper use, and potential adverse effects including nausea, vomiting, abdominal pain, GERD, weight loss, arthralgia, dizziness, and suns sensitivity/rash.  Stressed the importance of routine lab monitoring. Will monitor LFT's every month for the first 6 months of treatment then every 3 months. Will monitor CBC every 3 months.  Starting dose will be Esbriet 267 mg 1 tablet three times daily for 7 days, then 2 tablets three times daily for 7 days, then 3 tablets three times daily.  Maintenance dose will be 801 mg 1 tablet three times daily if tolerated.  Stressed the importance of taking with meals to minimize stomach upset.    Patient would like to fill with [facility].  Prescription sent and will set up shipment for 6/2 to arrive on 6/3.  All questions encouraged and answered.  Instructed patient to call with any further questions or concerns.  Mariella Saa, PharmD, [place], CPP Clinical Specialty Pharmacist (Rheumatology and Pulmonology)  07/22/2019 4:36 PM

## 2019-07-26 NOTE — Telephone Encounter (Signed)
Thanks and noted. Will close message from my end

## 2019-07-27 MED FILL — ESBRIET 267 MG TABS: 267 | 30 days supply | Qty: 207 | Fill #0

## 2019-07-28 ENCOUNTER — Telehealth: Payer: Self-pay | Admitting: Pulmonary Disease

## 2019-07-28 NOTE — Telephone Encounter (Signed)
Left message for patient to call back  

## 2019-08-02 NOTE — Telephone Encounter (Signed)
Contacted patient, Eduardo Fuentes is not longer having the symptom of congestion, will discuss cough medication at next office visit. Nothing further needed.

## 2019-08-08 ENCOUNTER — Other Ambulatory Visit: Payer: PPO

## 2019-08-19 ENCOUNTER — Other Ambulatory Visit: Payer: Self-pay | Admitting: Internal Medicine

## 2019-08-19 DIAGNOSIS — Z5181 Encounter for therapeutic drug level monitoring: Secondary | ICD-10-CM

## 2019-08-19 DIAGNOSIS — J84112 Idiopathic pulmonary fibrosis: Secondary | ICD-10-CM

## 2019-08-23 MED ORDER — ESBRIET 267 MG PO TABS: 3.0000 | ORAL_TABLET | Freq: Three times a day (TID) | ORAL | 2 refills | Status: DC

## 2019-08-23 MED FILL — ESBRIET 267 MG TABS: 267 | 30 days supply | Qty: 270 | Fill #0

## 2019-08-23 NOTE — Telephone Encounter (Addendum)
Pharmacy notified that refill was sent in for starter dose instead of maintenance dose.  Sent in prescription for Esbriet 267 mg 3 tablets 3 times daily with meals for a 30-day supply plus 2 refills. Discontinued starter dose and Ofev on medication list.    Patient due for hepatic panel at next office visit on 7/7.  Future order placed.  Standing orders for hepatic panel and cbc placed.  Mariella Saa, PharmD, [place], CPP Clinical Specialty Pharmacist (Rheumatology and Pulmonology)  08/23/2019 11:50 AM

## 2019-08-23 NOTE — Addendum Note (Signed)
Addended by: Mariella Saa C on: 08/23/2019 11:30 AM   Modules accepted: Orders

## 2019-08-23 NOTE — Addendum Note (Signed)
Addended by: Mariella Saa C on: 08/23/2019 11:55 AM   Modules accepted: Orders

## 2019-08-30 NOTE — Progress Notes (Signed)
@Patient  ID: Eduardo Fuentes, male    DOB: 11-12-1950, 69 y.o.   MRN: [id number]  Chief Complaint  Patient presents with  . Follow-up    Referring provider: Lujean Amel, MD  HPI:  69 year old male patient followed in our office for interstitial lung disease and IPF [clinical diagnosis based on classic UIP scan negative serology and age and male gender; diagnosis given February 11, 2017 and on Ofev since 02/27/2018  PMH: GERD Smoker/ Smoking History: Never Smoker  Maintenance: Esbriet  Pt of: Dr. Chase Caller  08/31/2019  - Visit   69 year old male never smoker followed in our office for IPF.  He was last seen in our office in May/2021 with Dr. Chase Caller.  At that office visit it was explained to him that he has had some progression of disease since November/2020.  Around an 8% loss in lung functioning.  Also having side effects of nausea and weight loss from Ofev.  Patient was instructed to stop Ofev as well start paperwork for Esbriet.  Patient was set up for a pharmacy team meeting for counseling on [place].  It was requested that the patient follow-up with nurse practitioner in 4 to 6 weeks as well as 8 to 12 weeks with spirometry with DLCO and a follow-up with Dr. Chase Caller in 8 to 12 weeks in a 30-minute time slot.  Patient reporting that he feels that he is doing better on Esbriet.  He feels the cough has improved.  He does still have some congestion in his throat.  He reports that Medicare does not cover Tessalon Perles.  He is wondering what other over-the-counter measures he could use for as needed management of his cough.  We will discuss this today.  Patient is still having aspects of nausea on the Esbriet but overall he feels like this is better than when he was on Ofev.  He still is able to push mow his yard this takes about 45 minutes.  He does have some right sided hip pain.  He is unsure if this was present prior to starting Esbriet.  He is just started to notice  it.  Questionaires / Pulmonary Flowsheets:   ACT:  No flowsheet data found.  MMRC: mMRC Dyspnea Scale mMRC Score  01/17/2019 0    Epworth:  No flowsheet data found.  Tests:   FENO:  No results found for: NITRICOXIDE  PFT: PFT Results Latest Ref Rng & Units 07/18/2019 01/17/2019 08/25/2018 02/11/2018  FVC-Pre L 2.65 2.82 2.69 2.90  FVC-Predicted Pre % 61 65 62 67  FVC-Post L - - - 2.52  FVC-Predicted Post % - - - 58  Pre FEV1/FVC % % 83 85 73 86  Post FEV1/FCV % % - - - 70  FEV1-Pre L 2.20 2.40 1.95 2.50  FEV1-Predicted Pre % 69 75 61 78  FEV1-Post L - - - 1.77  DLCO UNC% % 50 58 57 -  DLCO COR %Predicted % 83 88 97 -  TLC L - - - 3.41  TLC % Predicted % - - - 50  RV % Predicted % - - - 11    WALK:  SIX MIN WALK 08/31/2019 07/20/2019 01/17/2019 10/28/2018 08/30/2018 04/29/2018 04/06/2018  Supplimental Oxygen during Test? (L/min) No - No - No No -  2 Minute Oxygen Saturation % - - - 91 - - 92  2 Minute HR - - - 102 - - 110  4 Minute Oxygen Saturation % - - - 91 - - 91  4 Minute HR - - - 102 - - 110  6 Minute Oxygen Saturation % - - - 90 - - 92  6 Minute HR - - - 98 - - 111  Tech Comments: patient completed walk talking the whole time, no desats, patient tolerated well no complaints pt walked at even pace//sb Patient was able to complete 2 laps without stopping. Denied any SOB, CP or leg pain. No O2 needed during or after walk. - patient started with O2 sat of 97 on room air, and heart rate of 102 tolerated walk well, ending o2 sat at 96 with a heart rate of 100. Pt walked at a normal pace completing all required laps having complaints of mild SOB. -    Imaging: No results found.  Lab Results:  CBC    Component Value Date/Time   WBC 8.0 01/15/2018 1323   RBC 5.00 01/15/2018 1323   HGB 15.2 01/15/2018 1323   HCT 46.1 01/15/2018 1323   PLT 306 01/15/2018 1323   MCV 92.2 01/15/2018 1323   MCH 30.4 01/15/2018 1323   MCHC 33.0 01/15/2018 1323   RDW 14.0 01/15/2018 1323    LYMPHSABS 0.9 01/26/2013 1102   MONOABS 0.2 01/26/2013 1102   EOSABS 0.1 01/26/2013 1102   BASOSABS 0.0 01/26/2013 1102    BMET    Component Value Date/Time   NA 137 01/15/2018 1323   K 4.5 01/15/2018 1323   CL 102 01/15/2018 1323   CO2 28 01/15/2018 1323   GLUCOSE 90 01/15/2018 1323   BUN 16 01/15/2018 1323   CREATININE 1.18 01/15/2018 1323   CALCIUM 9.0 01/15/2018 1323   GFRNONAA >60 01/15/2018 1323   GFRAA >60 01/15/2018 1323    BNP No results found for: BNP  ProBNP No results found for: PROBNP  Specialty Problems      Pulmonary Problems   IPF (idiopathic pulmonary fibrosis) ([place])    01/20/2018-CT chest high-res-spectrum of findings compatible with basilar predominant fibrotic interstitial lung disease with mild honeycombing these findings are new compared to November 2014 chest radiograph.  Findings are consistent with UIP.   01/29/2018- hypersensitivity pneumonitis, CK, aldolase, CCP, Rh, ANA, ANA titer-trace positive ANA but otherwise autoimmune labs normal  01/29/2018- integrative comprehensive ILD questionnaire [employer] [employer] Comprehensive ILD Questionnaire  Symptoms: He reports shortness of breath onset gradually for the last 1 year.  It is getting worse with time.  He does have episodic dyspnea.  When he does shopping it is level 1.  When he walks on a level ground with others of his age at this level 1 walking up stairs at this level 1 but walking at his own pace at this level 2 and taking a shower at level 3.  He does have associated arthralgia and cough.  He is not sure when the cough started.  It is present at all positions since it started it is the same as moderate in intensity.  He does cough at night.  He does bring up phlegm which is clear in color.  It gets worse when he lies down and it affects his voice and he does clear his throat.  He does not feel a tickle.  There is no wheezing.   Past Medical History : This is positive for major depression but  he circled no for asthma, COPD.  Surgical no for heart failure.  Surgical no for collagen vascular disease with sleep apnea.  He did not answer for acid reflux which he does have..  Denies  diabetes or thyroid disease or heart disease or pulmonary embolism or pneumonia   ROS:  .  Positive for difficulty swallowing due to his esophageal stricture history.  Does have nausea and acid reflux and snoring.  Otherwise no vomiting is positive for soreness across the body after electroconvulsive therapy 2 days ago.   FAMILY HISTORY of LUNG DISEASE: Denies family history of any pulmonary fibrosis COPD or asthma sarcoidosis   EXPOSURE HISTORY: Denies smoking cigarettes or tobacco or marijuana or cocaine.  No intravenous drug abuse.   HOME and HOBBY DETAILS : He did not list the age of the home but is lived in this home for 25 years in an [place] setting.  There is no mold in the house of mildew.  The house is not damp.  Does not use a CPAP mask.  No nebulizer machine.  No use of Jacuzzi.  Nose fountain in the house.  No pet birds or gerbils.  Does not use feather pillows.  He is not sure if there is mold in the [facility] duct system.  Does not play wind instruments.  He occasionally mows the lawn   OCCUPATIONAL HISTORY (122 questions) : Positive for social gardening.  Did report that he is used feather pillows and blankets in the past but otherwise negative including working in a dusty environment   PULMONARY TOXICITY HISTORY (27 items): Positive for cancer chemotherapy but he does not know the details  02/11/2018- pulmonary function test.  FVC 2.9 (67% predicted), postbronchodilator ratio 70, FEV1 55, patient had difficulty completing pulmonary function test due to repeated coughing  08/25/2018-pulmonary function test-FVC 2.69 (62% predicted), ratio 73, FEV1 1.95 (61% predicted), DLCO 57          Allergies  Allergen Reactions  . Aleve [Naproxen Sodium] Anaphylaxis and Other (See Comments)    GI  UPSET Patient does not remember anaphylaxis  . Hyzaar [Losartan Potassium-Hctz] Other (See Comments)    Felt bad   . Lisinopril Cough  . Demerol [Meperidine] Rash         Immunization History  Administered Date(s) Administered  . Fluad Quad(high Dose 65+) 12/30/2018  . Influenza Inj Mdck Quad Pf 11/09/2017  . PFIZER SARS-COV-2 Vaccination 04/02/2019, 04/27/2019  . Pneumococcal Conjugate-13 10/26/2015  . Pneumococcal Polysaccharide-23 10/29/2016  . Zoster Recombinat (Shingrix) 12/03/2018, 02/01/2019    Past Medical History:  Diagnosis Date  . Agatston CAC score, <100 04/2018  . Arthritis    NECK  . Cough    NON-PRODUCTIVE  . Depressive disorder 10/2011   follow by dr [facility] psychiatry; has anxiety and panic disorder as well.  . Diverticulosis   . Diverticulosis of colon    Followed by Dr. Henrene Pastor  . Essential hypertension   . Generalized anxiety disorder   . GERD (gastroesophageal reflux disease)    WATCHES DIET  . Hemorrhoid   . Hiatal hernia    Describes a sliding hiatal hernia  . History of colon polyps   . History of kidney stones   . Idiopathic pulmonary fibrosis ([place])   . Microscopic hematuria   . Nocturia   . Panic attacks   . Prostate cancer ([place]) 07/2012   Dr. Arnell Sieving urology.  . Sinus infection   . Tinnitus    LEFT EAR -- CHRONIC    Tobacco History: Social History   Tobacco Use  Smoking Status Never Smoker  Smokeless Tobacco Never Used   Counseling given: Not Answered   Continue to not smoke  Outpatient Encounter Medications as  of 08/31/2019  Medication Sig  . clonazePAM (KLONOPIN) 1 MG tablet Take 1 tablet at 8 am, 1/2 tablet at 2 pm and 1 tablet at bedtime for anxiety. (Patient taking differently: TAKES 2-3 TABLETS EACH DAY.)  . gabapentin (NEURONTIN) 300 MG capsule Take 600 mg by mouth 2 (two) times daily.   . hydrochlorothiazide (HYDRODIURIL) 12.5 MG tablet Take 12.5 mg by mouth daily.  . mirtazapine (REMERON) 30 MG tablet Take  30 mg by mouth at bedtime.   . nortriptyline (PAMELOR) 50 MG capsule Take 50 mg by mouth at bedtime.   . pantoprazole (PROTONIX) 40 MG tablet Take 1 tablet (40 mg total) by mouth daily.  . Pirfenidone (ESBRIET) 267 MG TABS Take 3 tablets (801 mg total) by mouth with breakfast, with lunch, and with evening meal.   Facility-Administered Encounter Medications as of 08/31/2019  Medication  . 0.9 %  sodium chloride infusion     Review of Systems  Review of Systems  Constitutional: Negative for activity change, chills, fatigue, fever and unexpected weight change.  HENT: Negative for postnasal drip, rhinorrhea, sinus pressure, sinus pain and sore throat.   Eyes: Negative.   Respiratory: Positive for shortness of breath. Negative for cough and wheezing.   Cardiovascular: Negative for chest pain and palpitations.  Gastrointestinal: Positive for nausea. Negative for constipation, diarrhea and vomiting.  Endocrine: Negative.   Genitourinary: Negative.   Musculoskeletal: Negative.   Skin: Negative.   Neurological: Negative for dizziness and headaches.  Psychiatric/Behavioral: Negative.  Negative for dysphoric mood. The patient is not nervous/anxious.   All other systems reviewed and are negative.    Physical Exam  BP 112/72 (BP Location: Left Arm, Cuff Size: Normal)   Pulse 100   Temp 98 F (36.7 C) (Oral)   Ht 5\' 9"  (1.753 m)   Wt 184 lb 6.4 oz (83.6 kg)   SpO2 97%   BMI 27.23 kg/m   Wt Readings from Last 5 Encounters:  08/31/19 184 lb 6.4 oz (83.6 kg)  07/20/19 181 lb 12.8 oz (82.5 kg)  06/28/19 180 lb 6.4 oz (81.8 kg)  06/14/19 180 lb 6.4 oz (81.8 kg)  06/06/19 182 lb (82.6 kg)    BMI Readings from Last 5 Encounters:  08/31/19 27.23 kg/m  07/20/19 26.85 kg/m  06/28/19 26.64 kg/m  06/14/19 26.64 kg/m  06/06/19 26.88 kg/m     Physical Exam Vitals and nursing note reviewed.  Constitutional:      General: He is not in acute distress.    Appearance: Normal  appearance. He is normal weight.  HENT:     Head: Normocephalic and atraumatic.     Right Ear: Hearing, tympanic membrane, ear canal and external ear normal. There is no impacted cerumen.     Left Ear: Hearing, tympanic membrane, ear canal and external ear normal. There is no impacted cerumen.     Nose: Nose normal. No mucosal edema, congestion or rhinorrhea.     Right Turbinates: Not enlarged.     Left Turbinates: Not enlarged.     Mouth/Throat:     Mouth: Mucous membranes are dry.     Pharynx: Oropharynx is clear. No oropharyngeal exudate.  Eyes:     Pupils: Pupils are equal, round, and reactive to light.  Cardiovascular:     Rate and Rhythm: Normal rate and regular rhythm.     Pulses: Normal pulses.     Heart sounds: Normal heart sounds. No murmur heard.   Pulmonary:     Effort: Pulmonary  effort is normal.     Breath sounds: Examination of the right-middle field reveals rales. Examination of the left-middle field reveals rales. Examination of the right-lower field reveals rales. Examination of the left-lower field reveals rales. Rales (R>L ) present. No decreased breath sounds or wheezing.  Musculoskeletal:     Cervical back: Normal range of motion.     Right lower leg: No edema.     Left lower leg: No edema.  Lymphadenopathy:     Cervical: No cervical adenopathy.  Skin:    General: Skin is warm and dry.     Capillary Refill: Capillary refill takes less than 2 seconds.     Findings: No erythema or rash.  Neurological:     General: No focal deficit present.     Mental Status: He is alert and oriented to person, place, and time.     Motor: No weakness.     Coordination: Coordination normal.     Gait: Gait is intact. Gait normal.  Psychiatric:        Mood and Affect: Mood normal.        Behavior: Behavior normal. Behavior is cooperative.        Thought Content: Thought content normal.        Judgment: Judgment normal.       Assessment & Plan:   IPF (idiopathic  pulmonary fibrosis) (HCC) Plan: Continue Esbriet Lab work today Walk today in office today Spirometry with DLCO in 4 to 6 weeks with follow-up with Dr. Chase Caller  Therapeutic drug monitoring Plan: Lab work today    Return in about 2 months (around 11/01/2019), or if symptoms worsen or fail to improve, for Follow up with Dr. Purnell Shoemaker, ILD clinic - 36min slot, Follow up for PFT - 77min, Spiro with DLCO.   Lauraine Rinne, NP 08/31/2019   This appointment required 32 minutes of patient care (this includes precharting, chart review, review of results, face-to-face care, etc.).

## 2019-08-31 ENCOUNTER — Encounter: Payer: Self-pay | Admitting: Pulmonary Disease

## 2019-08-31 ENCOUNTER — Ambulatory Visit: Payer: PPO | Admitting: Pulmonary Disease

## 2019-08-31 ENCOUNTER — Other Ambulatory Visit: Payer: Self-pay

## 2019-08-31 VITALS — BP 112/72 | HR 100 | Temp 98.0°F | Ht 69.0 in | Wt 184.4 lb

## 2019-08-31 DIAGNOSIS — Z5181 Encounter for therapeutic drug level monitoring: Secondary | ICD-10-CM

## 2019-08-31 DIAGNOSIS — J84112 Idiopathic pulmonary fibrosis: Secondary | ICD-10-CM

## 2019-08-31 LAB — COMPREHENSIVE METABOLIC PANEL
ALT: 13 U/L (ref 0–53)
AST: 21 U/L (ref 0–37)
Albumin: 4.2 g/dL (ref 3.5–5.2)
Alkaline Phosphatase: 90 U/L (ref 39–117)
BUN: 23 mg/dL (ref 6–23)
CO2: 33 mEq/L — ABNORMAL HIGH (ref 19–32)
Calcium: 9.6 mg/dL (ref 8.4–10.5)
Chloride: 99 mEq/L (ref 96–112)
Creatinine, Ser: 1.42 mg/dL (ref 0.40–1.50)
GFR: 49.32 mL/min — ABNORMAL LOW (ref >60.00)
Glucose, Bld: 81 mg/dL (ref 70–99)
Potassium: 4.1 mEq/L (ref 3.5–5.1)
Sodium: 138 mEq/L (ref 135–145)
Total Bilirubin: 0.4 mg/dL (ref 0.2–1.2)
Total Protein: 7.2 g/dL (ref 6.0–8.3)

## 2019-08-31 NOTE — Assessment & Plan Note (Signed)
Plan: Continue Esbriet Lab work today Walk today in office today Spirometry with DLCO in 4 to 6 weeks with follow-up with Dr. Chase Caller

## 2019-08-31 NOTE — Patient Instructions (Signed)
You were seen today by Lauraine Rinne, NP  for:   1. IPF (idiopathic pulmonary fibrosis) ([place])  - Pulmonary function test; Future  Continue Esbriet  Lab work today  [place] today in office  Try to increase protein with your breakfast options  2. Therapeutic drug monitoring  - Comp Met (CMET); Future   We recommend today:  Orders Placed This Encounter  Procedures  . Comp Met (CMET)    Standing Status:   Future    Standing Expiration Date:   08/30/2020  . Pulmonary function test    Arlyce Harman with dlco    Standing Status:   Future    Standing Expiration Date:   08/30/2020    Scheduling Instructions:     4-8 weeks    Order Specific Question:   Where should this test be performed?    Answer:   [facility]   Orders Placed This Encounter  Procedures  . Comp Met (CMET)  . Pulmonary function test   No orders of the defined types were placed in this encounter.   Follow Up:    Return in about 2 months (around 11/01/2019), or if symptoms worsen or fail to improve, for Follow up with Dr. Purnell Shoemaker, ILD clinic - 79mn slot, Follow up for PFT - 315m, Spiro with DLCO.   Please do your part to reduce the spread of COVID-19:      Reduce your risk of any infection  and COVID19 by using the similar precautions used for avoiding the common cold or flu:  . Marland Kitchenash your hands often with soap and warm water for at least 20 seconds.  If soap and water are not readily available, use an alcohol-based hand sanitizer with at least 60% alcohol.  . If coughing or sneezing, cover your mouth and nose by coughing or sneezing into the elbow areas of your shirt or coat, into a tissue or into your sleeve (not your hands). . Langley Gauss MASK when in public  . Avoid shaking hands with others and consider head nods or verbal greetings only. . Avoid touching your eyes, nose, or mouth with unwashed hands.  . Avoid close contact with people who are sick. . Avoid places or events with large numbers of people in one  location, like concerts or sporting events. . If you have some symptoms but not all symptoms, continue to monitor at home and seek medical attention if your symptoms worsen. . If you are having a medical emergency, call 911.   [place] e-Visit: [email or web address]       [facility]: [address] [facility]: [phone]                 [facility]: [phone]   It is flu season:   >>> Best ways to protect herself from the flu: Receive the yearly flu vaccine, practice good hand hygiene washing with soap and also using hand sanitizer when available, eat a nutritious meals, get adequate rest, hydrate appropriately   Please contact the office if your symptoms worsen or you have concerns that you are not improving.   Thank you for choosing [facility] for your healthcare, and for allowing [facility] partner with you on your healthcare journey. I am thankful to be able to provide care to you today.   BrWyn QuakerNP-C

## 2019-08-31 NOTE — Assessment & Plan Note (Signed)
Plan: Lab work today 

## 2019-09-05 ENCOUNTER — Telehealth: Payer: Self-pay | Admitting: Pulmonary Disease

## 2019-09-05 NOTE — Telephone Encounter (Signed)
Okay to take Tylenol.  We will route back to the pharmacy team to see if they have additional thoughts or recommendations.  May be our pharmacy team can contact the [facility] and see what their thoughts are?Wyn Quaker, FNP

## 2019-09-05 NOTE — Telephone Encounter (Signed)
09/05/2019  Please let the patient know that I have discussed his case with the pharmacy team.  They did not find any information regarding worsened hip pain since starting Esbriet.  Based on this information I would recommend that you remain on Esbriet at this time.  Wyn Quaker, FNP

## 2019-09-05 NOTE — Telephone Encounter (Signed)
Called and spoke with patient and he states that he does not remember if he had the hip pain before when he was on the Ofev and before he started Esbriet. [facility] and [facility] to [facility] specialty pharmacy and they informed him that it is a side effect and that some people do end up getting hip pain from taking it.    He would like to know if he took tylenol if you though it would help or if there was any other recommendations?

## 2019-09-05 NOTE — Telephone Encounter (Signed)
Called and spoke with Patient.  Brian,NP recommendations given.  Patient stated understanding.  Nothing further at this time.

## 2019-09-05 NOTE — Telephone Encounter (Signed)
Pt calling back about his pain that has started since starting the esbriet. Pt can be reached at [phone].

## 2019-09-06 NOTE — Telephone Encounter (Signed)
Hip p[ain is NOT a side effect of esbriet or ofev tyically. IF it is is then is an uncommon and he will be the first in my memory recall .  Plan  - if he want to know if hip pain is from anti fibrotic then he has to stop it for few weeks and see what happens

## 2019-09-06 NOTE — Telephone Encounter (Signed)
Pt is aware of blow message and voiced his understanding.  Pt will stop anti fibrotic for a few weeks and will call back with an update.  Nothing further is needed at this time

## 2019-09-14 ENCOUNTER — Other Ambulatory Visit: Payer: Self-pay | Admitting: Internal Medicine

## 2019-09-16 DIAGNOSIS — L821 Other seborrheic keratosis: Secondary | ICD-10-CM | POA: Diagnosis not present

## 2019-09-16 DIAGNOSIS — L814 Other melanin hyperpigmentation: Secondary | ICD-10-CM | POA: Diagnosis not present

## 2019-09-16 DIAGNOSIS — L918 Other hypertrophic disorders of the skin: Secondary | ICD-10-CM | POA: Diagnosis not present

## 2019-09-16 DIAGNOSIS — D1801 Hemangioma of skin and subcutaneous tissue: Secondary | ICD-10-CM | POA: Diagnosis not present

## 2019-09-16 DIAGNOSIS — D225 Melanocytic nevi of trunk: Secondary | ICD-10-CM | POA: Diagnosis not present

## 2019-09-19 DIAGNOSIS — F3342 Major depressive disorder, recurrent, in full remission: Secondary | ICD-10-CM | POA: Diagnosis not present

## 2019-09-19 DIAGNOSIS — F341 Dysthymic disorder: Secondary | ICD-10-CM | POA: Diagnosis not present

## 2019-09-22 MED FILL — ESBRIET 267 MG TABS: 267 | 30 days supply | Qty: 270 | Fill #1

## 2019-10-03 ENCOUNTER — Other Ambulatory Visit: Payer: Self-pay | Admitting: Family Medicine

## 2019-10-03 ENCOUNTER — Other Ambulatory Visit: Payer: Self-pay

## 2019-10-03 ENCOUNTER — Ambulatory Visit
Admission: RE | Admit: 2019-10-03 | Discharge: 2019-10-03 | Disposition: A | Discharge status: Home / Self Care | Payer: PPO | Source: Ambulatory Visit | Attending: Family Medicine | Admitting: Family Medicine

## 2019-10-03 DIAGNOSIS — M25551 Pain in right hip: Secondary | ICD-10-CM

## 2019-10-03 DIAGNOSIS — K5901 Slow transit constipation: Secondary | ICD-10-CM | POA: Diagnosis not present

## 2019-10-17 DIAGNOSIS — C61 Malignant neoplasm of prostate: Secondary | ICD-10-CM | POA: Diagnosis not present

## 2019-10-17 DIAGNOSIS — N5082 Scrotal pain: Secondary | ICD-10-CM | POA: Diagnosis not present

## 2019-10-17 DIAGNOSIS — Z87442 Personal history of urinary calculi: Secondary | ICD-10-CM | POA: Diagnosis not present

## 2019-11-01 ENCOUNTER — Ambulatory Visit (INDEPENDENT_AMBULATORY_CARE_PROVIDER_SITE_OTHER): Payer: PPO | Admitting: Internal Medicine

## 2019-11-01 ENCOUNTER — Other Ambulatory Visit: Payer: Self-pay

## 2019-11-01 ENCOUNTER — Encounter: Payer: Self-pay | Admitting: Internal Medicine

## 2019-11-01 ENCOUNTER — Ambulatory Visit: Payer: PPO | Admitting: Internal Medicine

## 2019-11-01 VITALS — BP 110/66 | HR 105 | Temp 98.0°F | Ht 69.0 in | Wt 182.0 lb

## 2019-11-01 DIAGNOSIS — J84112 Idiopathic pulmonary fibrosis: Secondary | ICD-10-CM | POA: Diagnosis not present

## 2019-11-01 DIAGNOSIS — Z5181 Encounter for therapeutic drug level monitoring: Secondary | ICD-10-CM | POA: Diagnosis not present

## 2019-11-01 LAB — PULMONARY FUNCTION TEST
DL/VA % pred: 109 %
DL/VA: 4.49 ml/min/mmHg/L
DLCO cor % pred: 65 %
DLCO cor: 16.68 ml/min/mmHg
DLCO unc % pred: 65 %
DLCO unc: 16.68 ml/min/mmHg
FEF 25-75 Pre: 2.03 L/sec
FEF2575-%Pred-Pre: 83 %
FEV1-%Pred-Pre: 68 %
FEV1-Pre: 2.18 L
FEV1FVC-%Pred-Pre: 107 %
FEV6-%Pred-Pre: 66 %
FEV6-Pre: 2.71 L
FEV6FVC-%Pred-Pre: 105 %
FVC-%Pred-Pre: 63 %
FVC-Pre: 2.75 L
Pre FEV1/FVC ratio: 79 %
Pre FEV6/FVC Ratio: 100 %

## 2019-11-01 LAB — HEPATIC FUNCTION PANEL
ALT: 14 U/L (ref 0–53)
AST: 22 U/L (ref 0–37)
Albumin: 4.2 g/dL (ref 3.5–5.2)
Alkaline Phosphatase: 82 U/L (ref 39–117)
Bilirubin, Direct: 0.1 mg/dL (ref 0.0–0.3)
Total Bilirubin: 0.4 mg/dL (ref 0.2–1.2)
Total Protein: 7.2 g/dL (ref 6.0–8.3)

## 2019-11-01 MED FILL — ESBRIET 267 MG TABS: 267 | 30 days supply | Qty: 270 | Fill #2

## 2019-11-01 NOTE — Patient Instructions (Addendum)
ICD-10-CM   1. IPF (idiopathic pulmonary fibrosis) ([place])  J84.112   2. Therapeutic drug monitoring  Z51.81      You are having progressive disease since November 2020 -> May 2021. Then also due to nausea and weight loss we stopped ofev. You have been on esbriet since early June 2021  Currently lung function is stable between May 2021 and September 2021 -this is on pirfenidone  You are having some nausea this is being controlled with help of Zofran as needed  Plan -Do simple walk test today -Check liver function test today and repeat liver function test again in 1 month -Continue pirfenidone/Esbriet -but increase the spacing between dosing -Use Zofran as needed for nausea -Repeat high-resolution CT scan of the chest in 3 months [last one was in October 2020]  -Supine and prone   Follow-up -3 months to see Dr. Chase Caller face-to-face for a 30-minute visit but after high-resolution CT scan of the chest  - at at the time of follow-up we will need ILD symptom score and simple walk test

## 2019-11-01 NOTE — Progress Notes (Addendum)
Spirometry/DLCO performed today. 

## 2019-11-01 NOTE — Addendum Note (Signed)
Addended by: Vanessa Barbara on: 11/01/2019 12:30 PM   Modules accepted: Orders

## 2019-11-01 NOTE — Progress Notes (Signed)
LFT normal on esbriet   Notification of test results are managed in the following manner: If there are  any recommendations or changes to the  plan of care discussed in office today,  we will contact you and let you know what they are. If you do not hear from [place], then your results are normal and you can view them through your  MyChart account , or a letter will be sent to you. Thank you again for trusting [place] with your care,  Dr [facility].

## 2019-11-01 NOTE — Progress Notes (Signed)
Primary Care Provider: Lujean Amel, MD Cardiologist: Eduardo Hew, MD Electrophysiologist: None  Pulmonologist: Dr. Chase Fuentes GI: Dr. Henrene Fuentes Urology: Dr. Serita Fuentes Neuro: Dr. Jannifer Fuentes Psych: Dr. Carolee Fuentes 01/29/2018  Chief Complaint  Patient presents with  . Pulmonary Consult    self referral, abnormal CT, ? ILD from CT scan, SOB with exertion     Eduardo Fuentes presents self-referral for interstitial lung disease.  His wife Eduardo Fuentes for ruling out interstitial lung disease which we did.  Patient is a non-smoker but has long suffered from acid reflux and esophageal stenosis.  He also has a sliding hiatal hernia.  He says since spring 2019 he has had insidious onset of shortness of breath that is progressive associated with some cough.  During this time he is also had worsening endogenous depression.  He was seeing a psychiatrist multiple antidepressants have failed.  Electroconvulsive therapy has been recommended and he had one on January 27, 2018 2 days ago.  During this time a chest x-ray was done as part of pre-electroconvulsive therapy evaluation and this suggested interstitial lung disease.  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  I agree with the radiologist.  The some amount of mediastinal adenopathy that is felt to be reactive.  There is also a sliding hiatal hernia.  It is described that these findings are new compared to 2014 chest x-ray.  At this point in time walking desaturation test he did not desaturate but dropped 4 points and pulse ox    [place] Integrated Comprehensive ILD Questionnaire  Symptoms: He reports shortness of breath onset gradually for the last 1 year.  It is getting worse with time.  He does have episodic dyspnea.  When he does shopping it is level 1.  When he walks on a level ground  with others of his Eduardo at this level 1 walking up stairs at this level 1 but walking at his own pace at this level 2 and taking a shower at level 3.  He does have associated arthralgia and cough.  He is not sure when the cough started.  It is present at all positions since it started it is the same as moderate in intensity.  He does cough at night.  He does bring up phlegm which is clear in color.  It gets worse when he lies down and it affects his voice and he does clear his throat.  He does not feel a tickle.  There is no wheezing.   Past Medical History : This is positive for major depression but he circled no for asthma, COPD.  Surgical no for heart failure.  Surgical no for collagen vascular disease with sleep apnea.  He did not answer for acid reflux which he does have..  Denies diabetes or thyroid disease or heart disease or pulmonary embolism or pneumonia   ROS:  .  Positive for difficulty swallowing due to his esophageal stricture history.  Does have nausea and acid reflux and snoring.  Otherwise no vomiting is positive for soreness across the body after electroconvulsive therapy 2 days ago.   FAMILY HISTORY of LUNG DISEASE: Denies family history of any pulmonary fibrosis COPD or asthma sarcoidosis   EXPOSURE HISTORY: Denies smoking cigarettes or tobacco or marijuana or cocaine.  No intravenous drug abuse.  HOME and HOBBY DETAILS : He did not list the Eduardo of the home but is lived in this home for 25 years [place].  There is no mold in the house of mildew.  The house is not damp.  Does not use a CPAP mask.  No nebulizer machine.  No use of Jacuzzi.  Nose fountain in the house.  No pet birds or gerbils.  Does not use feather pillows.  He is not sure if there is mold in the [facility] duct system.  Does not play wind instruments.  He occasionally mows the lawn   OCCUPATIONAL HISTORY (122 questions) : Positive for social gardening.  Did report that he is used feather pillows and blankets  in the past but otherwise negative including working in a dusty environment   [place] (27 items): Positive for cancer chemotherapy but he does not know the details      OV 03/16/2018  Subjective:  Patient ID: Eduardo Fuentes, Eduardo Fuentes , DOB: January 09, 1951 , Eduardo 69 Fuentes. , MRN: [id number] , ADDRESS: [place] [id number]   03/16/2018 -   Chief Complaint  Patient presents with  . Follow-up    Pt started OFEV 02/27/2018.  Pt states he has been doing okay since last visit. Denies any current complaints with the OFEV. Pt states he does have SOB which is mainy with activities.   IPF [clinical diagnosis based on classic UIP scan negative serology and Eduardo and Eduardo Fuentes gender; diagnosis given February 11, 2017]  HPI Eduardo Fuentes. -IPF follow-up.  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  He was started on nintedanib.  He started this on February 27, 2018.  Suffice tolerating it well without any problems.  He is debating about starting pulmonary rehabilitation.  Mostly does not want to pay $20 a week for this.  The other alternative is that he can go [facility] [employer] and exercise by himself without any co-pay but there is no trainer.  I asked him to look at rehab as a short-term thing with the help of a trainer and the cost of a trainer being $20 a week.  He then opened up to the idea of attending pulmonary rehabilitation.  He still and his wife both had questions about IPF and etiology.  Be on sliding hiatal hernia but not able to discover anything else.  His reflux is better controlled now with PPI.  In terms of her shortness of breath this is stable.  In terms of his cough this is stable.  He continues to deal with depression but is not suicidal or homicidal.  His walking desaturation test today shows stability.  Wife wanted some education material about IPF and nintedanib.         OV 04/29/2018  Subjective:  Patient ID: Eduardo Fuentes, Eduardo Fuentes , Eduardo Fuentes , Eduardo 69 Fuentes. , MRN: [id number] , ADDRESS: [place] [id number]   04/29/2018 -   Chief Complaint  Patient presents with  . Follow-up    Pt states he has had some weakness going on which he states could be due to not eating enough with the OFEV. Pt also has had some loose stools as well as nausea.    IPF [clinical diagnosis based on classic UIP scan negative serology and Eduardo and Eduardo Fuentes gender; diagnosis given February 11, 2017 and on Ofev since 02/27/2018   HPI Eduardo Fuentes 9 Fuentes. -  presents for follow-up of his IPF.  He is now doing nintedanib for 2 months.  Overall stable.  Overall tolerating nintedanib really well.  He had some low appetite.  There.  Some nausea he had and that yesterday he had some diarrhea after eating pizza.  He thinks it is a pizza that caused the diarrhea not the nintedanib.  In terms of his dyspnea he stable quality of life is stable.  He said he is doing well with pulmonary rehabilitation.  Maintain his pulse ox 90% or so.  He is asking if the nintedanib is helping him in terms of disease stability.  Discussed that this might not be known fully in an individual.  His mood is better   Results for Eduardo Fuentes, Eduardo Fuentes (MRN [id number]) as of 03/16/2018 14:30  Ref. Range 01/29/2018 12:42  ASPERGILLUS FUMIGATUS Latest Ref Range: NEGATIVE  NEGATIVE  [place] Serum Latest Ref Range: NEGATIVE  NEGATIVE  Anti Nuclear Antibody(ANA) Latest Ref Range: NEGATIVE  POSITIVE (A)  ANA Pattern 1 Unknown Nuclear, Homogeneous (A)  ANA Titer 1 Latest Units: titer 0:94 (H)  Cyclic Citrullin Peptide Ab Latest Units: UNITS <16  RA Latex Turbid. Latest Ref Range: <14 IU/mL <14   ROS - per HPI  Results for JSIAH, MENTA (MRN [id number]) as of 03/16/2018 14:54  Ref. Range 03/08/2018 11:50  AST Latest Ref Range: 0 - 37 U/L 23  ALT Latest Ref Range: 0 - 53 U/L 15  Total Protein Latest Ref Range: 6.0 - 8.3 g/dL 7.2    ROS - per HPI    OV 04/01/2019  Subjective:   Patient ID: Eduardo Fuentes, Eduardo Fuentes , DOB: 07-26-1950 , Eduardo 46 Fuentes. , MRN: [id number] , ADDRESS: [place] [address]   04/01/2019 -   Chief Complaint  Patient presents with  . Follow-up    Pt states hes been feeling ok but has had some nausea after breakfast and morning nap. Very little cough. SOB on exertion. Pt denies any wheezing, fever, or chills.    IPF [clinical diagnosis based on classic UIP scan negative serology and Eduardo and Eduardo Fuentes gender; diagnosis given February 11, 2017 and on Ofev since 02/27/2018  HPI Eduardo Fuentes 35 Fuentes. -on this telephone visit reports nausea with nintedanib in the mornings.  He has a very light breakfast that has applesauce.  He does not eat much then after the breakfast at 9:00 he has some nausea that is mild to moderate in intensity.  Documented below with a question elicited a few days ago.  In terms of shortness of breath he is stable.  He is asking about exercising.  Otherwise he feels good.  At night only occasionally has nausea.  He has enough refills of Zofran but is not taking it.  I saw him a few days ago when he was in the office with his wife and at that time unofficial lung exam showed stable crackles and he looked well.  There is no evidence of ongoing depression or cognitive ability.  His memory is very sharp.     IMPRESSION: 1. The appearance of the lungs is compatible with interstitial lung disease, with a spectrum of findings considered probable usual interstitial pneumonia (UIP) per current ATS guidelines. No significant progression of disease compared to the prior study. 2. Aortic atherosclerosis, in addition to left main and 3 vessel coronary artery disease. Please note that although the presence of coronary artery calcium documents the presence of coronary artery disease, the  severity of this disease and any potential stenosis cannot be assessed on this non-gated CT examination. Assessment for potential risk factor  modification, dietary therapy or pharmacologic therapy may be warranted, if clinically indicated.  Aortic Atherosclerosis (ICD10-I70.0).   Electronically Signed   By: Vinnie Langton M.D.   On: 12/08/2018 15:10 ROS - per HPI  Results for BRADAN, CONGROVE (MRN [id number]) as of 04/01/2019 09:43  Ref. Range 01/17/2019 12:25  AST Latest Ref Range: 0 - 37 U/L 27  ALT Latest Ref Range: 0 - 53 U/L 21     OV 07/20/2019  Subjective:  Patient ID: Eduardo Fuentes, Eduardo Fuentes , DOB: 1950-08-17 , Eduardo 23 Fuentes. , MRN: [id number] , ADDRESS: [place] [id number]  IPF [clinical diagnosis based on classic UIP scan negative serology and Eduardo and Eduardo Fuentes gender; diagnosis given February 11, 2017 and on Ofev since 02/27/2018   07/20/2019 -   Chief Complaint  Patient presents with  . Follow-up    pt is here to go over pft results. pt states nausea with the ofev mes     HPI Eduardo Fuentes 45 Fuentes. -was stable at the time of last visit in February 2021. Because of coronary artery calcification I referred him to cardiology. Reviewed Dr. Shanon Brow Harding's notes. No indication for stress test. At that visit he had some nausea that was moderate. He then saw gastroenterologist Dr. Scarlette Shorts. Early in May 2021 this month he underwent upper endoscopy and lower endoscopy. I have reviewed the notes and visualize the results. These are normal. The nausea is felt to be due to drug. He is got moderate nausea although the dyspnea itself is same. He feels it is because of nintedanib. In fact he is also lost weight over 10 pounds since the last 1 year. He is not sure why. Nevertheless he is quite active. From a dyspnea disease standpoint he feels the same but his pulmonary function test shows decline of at least 6% and FVC and 13% and DLCO and since November 2020. He is worried about this.       OV 11/01/2019   Subjective:  Patient ID: Eduardo Fuentes, Eduardo Fuentes , DOB: [birth year], Eduardo 89 Fuentes. years. , MRN:  [id number],  ADDRESS: [place] [address] PCP  Eduardo Amel, MD Providers : Treatment Team:  Attending Provider: Brand Males, MD   Chief Complaint  Patient presents with  . Follow-up    get pft results.  cough worse,dry cough.     IPF [clinical diagnosis based on classic UIP scan negative serology and Eduardo and Eduardo Fuentes gender; diagnosis given February 11, 2017 and on Ofev since 02/27/2018.  Stopped nintedanib May 2021.  Started pirfenidone approximately June 3 or July 29, 2019.  Last CT scan of the chest mid October 2020    HPI Eduardo Fuentes 66 Fuentes. -presents for follow-up.  At last visit when he saw me face-to-face in May 2020 when I switched his nintedanib to pirfenidone.  This because of nausea and GI side effects and weight loss and also decline in lung function.  Since early June 2020 when he has been on pirfenidone.  He has some nausea with this but is well controlled with Zofran.  He stopped losing weight.  His weight is stable.  He does feel his cough at night is slightly worse than usual but otherwise his shortness of breath and activities are good.  He works out in [employer] wearing a mask.  He has had his Covid vaccine.  He has no change in his effort tolerance.  His last CT scan of the chest was in mid October 2020 and his ILD was stable.  He is asking about repeating another CT chest.  He is due for liver function test today.  He also wants to have a walk test today.      SYMPTOM SCALE - ILD 03/28/2019  07/20/2019 - 180# Stop pfev 11/01/2019 182# start esbriet  O2 use ra ra   Shortness of Breath 0 -> 5 scale with 5 being worst (score 6 If unable to do)    At rest 0 0   Simple tasks - showers, clothes change, eating, shaving 0 0   Household (dishes, doing bed, laundry) 1 1   Shopping 1 0   Walking level at own pace 1 1   Walking up Stairs 2 2   Total (30-36) Dyspnea Score 5 4   How bad is your cough? 1 1   How bad is your fatigue 1 1   How bad is nausea  3 2   How bad is vomiting?  0 0   How bad is diarrhea? 1 1   How bad is anxiety? 1 1   How bad is depression 1 0        01/29/2018  03/16/2018 190# 04/29/2018 193# 07/20/2019 180# 11/01/2019 182#  O2 used Room air rooom air Room air   room air  Number laps completed 3 3 3    three  Comments about pace 250 feet x 3 laps on POD B at [employer] pace Normal pace   normal pace  Resting Pulse Ox/HR 100% and 89/min 100% and 110/min 96% and 93/min 97% and 93/min 100% and HR 90/min  Final Pulse Ox/HR 96% and 109/min 97% and 118/min 91% and 107/min 90% and 110/min 93% and 106/minm  Desaturated </= 88% no no no    Desaturated <= 3% points Yes, 4poin Yes, 3 poitns Yes, 5 points Yes, 7 points Yes, 7 points  Got Tachycardic >/= 90/min yes yes yes  yes  Symptoms at end of test no Mild dyspnea Mild dyspnea    Miscellaneous comments no x x Likely worse stable   PFT Results Latest Ref Rng & Units 11/01/2019 07/18/2019 01/17/2019 08/25/2018 02/11/2018  FVC-Pre L 2.75 2.65 2.82 2.69 2.90  FVC-Predicted Pre % 63 61 65 62 67  FVC-Post L - - - - 2.52  FVC-Predicted Post % - - - - 58  Pre FEV1/FVC % % 79 83 85 73 86  Post FEV1/FCV % % - - - - 70  FEV1-Pre L 2.18 2.20 2.40 1.95 2.50  FEV1-Predicted Pre % 68 69 75 61 78  FEV1-Post L - - - - 1.77  DLCO uncorrected ml/min/mmHg 16.68 12.67 14.84 14.69 -  DLCO UNC% % 65 50 58 57 -  DLCO corrected ml/min/mmHg 16.68 12.67 - - -  DLCO COR %Predicted % 65 50 - - -  DLVA Predicted % 109 83 88 97 -  TLC L - - - - 3.41  TLC % Predicted % - - - - 50  RV % Predicted % - - - - 11        ROS - per HPI     has a past medical history of Agatston CAC score, <100 (04/2018), Arthritis, Cough, Depressive disorder (10/2011), Diverticulosis, Diverticulosis of colon, Essential hypertension, Generalized anxiety disorder, GERD (gastroesophageal reflux disease), Hemorrhoid, Hiatal hernia, History  of colon polyps, History of kidney stones, Idiopathic pulmonary  fibrosis ([place]), Microscopic hematuria, Nocturia, Panic attacks, Prostate cancer ([place]) (07/2012), Sinus infection, and Tinnitus.   reports that he has never smoked. He has never used smokeless tobacco.  Past Surgical History:  Procedure Laterality Date  . COLONOSCOPY  03/04/2013  . ESOPHAGOGASTRODUODENOSCOPY    . EXTRACORPOREAL SHOCK WAVE LITHOTRIPSY  YRS AGO  . PROSTATE Fuentes    . RADIOACTIVE SEED IMPLANT N/A 02/02/2013   Procedure: RADIOACTIVE SEED IMPLANT;  Surgeon: Molli Hazard, MD;  Location: [facility];  Service: Urology;  Laterality: N/A;  . TONSILLECTOMY AND ADENOIDECTOMY  AS CHILD  . UPPER GASTROINTESTINAL ENDOSCOPY  2019    Allergies  Allergen Reactions  . Aleve [Naproxen Sodium] Anaphylaxis and Other (See Comments)    GI UPSET Patient does not remember anaphylaxis  . Hyzaar [Losartan Potassium-Hctz] Other (See Comments)    Felt bad   . Lisinopril Cough  . Demerol [Meperidine] Rash         Immunization History  Administered Date(s) Administered  . Fluad Quad(high Dose 65+) 12/30/2018  . Influenza Inj Mdck Quad Pf 11/09/2017  . PFIZER SARS-COV-2 Vaccination 04/02/2019, 04/27/2019  . Pneumococcal Conjugate-13 10/26/2015  . Pneumococcal Polysaccharide-23 10/29/2016  . Zoster Recombinat (Shingrix) 12/03/2018, 02/01/2019    Family History  Problem Relation Eduardo of Onset  . Dementia Mother   . Anxiety disorder Mother   . Alzheimer's disease Mother   . Bone cancer Father        Metastatic prostate  . Cancer Father        prostate  . Heart attack Father 48       Referred for CABG  . Anxiety disorder Sister   . Colon cancer Neg Hx   . Esophageal cancer Neg Hx   . Rectal cancer Neg Hx   . Stomach cancer Neg Hx   . Colon polyps Neg Hx      Current Outpatient Medications:  .  clonazePAM (KLONOPIN) 1 MG tablet, Take 1 tablet at 8 am, 1/2 tablet at 2 pm and 1 tablet at bedtime for anxiety. (Patient taking differently: TAKES 2-3 TABLETS  EACH DAY.), Disp: 75 tablet, Rfl: 0 .  gabapentin (NEURONTIN) 300 MG capsule, Take 600 mg by mouth 2 (two) times daily. , Disp: , Rfl:  .  hydrochlorothiazide (HYDRODIURIL) 12.5 MG tablet, Take 12.5 mg by mouth daily., Disp: , Rfl:  .  mirtazapine (REMERON) 30 MG tablet, Take 30 mg by mouth at bedtime. , Disp: , Rfl: 4 .  nortriptyline (PAMELOR) 50 MG capsule, Take 50 mg by mouth at bedtime. , Disp: , Rfl:  .  ondansetron (ZOFRAN) 4 MG tablet, SMARTSIG:1-2 Tablet(s) By Mouth, Disp: , Rfl:  .  pantoprazole (PROTONIX) 40 MG tablet, TAKE 1 TABLET(40 MG) BY MOUTH DAILY, Disp: 90 tablet, Rfl: 1 .  Pirfenidone (ESBRIET) 267 MG TABS, Take 3 tablets (801 mg total) by mouth with breakfast, with lunch, and with evening meal., Disp: 270 tablet, Rfl: 2  Current Facility-Administered Medications:  .  0.9 %  sodium chloride infusion, 500 mL, Intravenous, Once, Irene Shipper, MD      Objective:   Vitals:   11/01/19 1026  BP: 110/66  Pulse: (!) 105  Temp: 98 F (36.7 C)  SpO2: 94%  Weight: 182 lb (82.6 kg)  Height: 5\' 9"  (1.753 m)    Estimated body mass index is 26.88 kg/m as calculated from the following:   Height as of this encounter:  5\' 9"  (1.753 m).   Weight as of this encounter: 182 lb (82.6 kg).  @WEIGHTCHANGE @  [employer]   11/01/19 1026  Weight: 182 lb (82.6 kg)     Physical Exam  General Appearance:    Alert, cooperative, no distress, appears stated Eduardo - yes , Deconditioned looking - no , OBESE  - no, Sitting on Wheelchair -  no  Head:    Normocephalic, without obvious abnormality, atraumatic  Eyes:    PERRL, conjunctiva/corneas clear,  Ears:    Normal TM's and external ear canals, both ears  Nose:   Nares normal, septum midline, mucosa normal, no drainage    or sinus tenderness. OXYGEN ON  - no . Patient is @ ra   Throat:   Lips, mucosa, and tongue normal; teeth and gums normal. Cyanosis on lips - no  Neck:   Supple, symmetrical, trachea midline, no adenopathy;     thyroid:  no enlargement/tenderness/nodules; no carotid   bruit or JVD  Back:     Symmetric, no curvature, ROM normal, no CVA tenderness  Lungs:     Distress - no , Wheeze no, Barrell Chest - no, Purse lip breathing - no, Crackles - faint at base   Chest Wall:    No tenderness or deformity.    Heart:    Regular rate and rhythm, S1 and S2 normal, no rub   or gallop, Murmur - no  Breast Exam:    NOT DONE  Abdomen:     Soft, non-tender, bowel sounds active all four quadrants,    no masses, no organomegaly. Visceral obesity - no  Genitalia:   NOT DONE  Rectal:   NOT DONE  Extremities:   Extremities - normal, Has Cane - no, Clubbing - no, Edema - no  Pulses:   2+ and symmetric all extremities  Skin:   Stigmata of Connective Tissue Disease - n  Lymph nodes:   Cervical, supraclavicular, and axillary nodes normal  Psychiatric:  Neurologic:   Pleasant - yes, Anxious - no, Flat affect - no  CAm-ICU - neg, Alert and Oriented x 3 - yes, Moves all 4s - yes, Speech - normal, Cognition - intact           Assessment:       ICD-10-CM   1. IPF (idiopathic pulmonary fibrosis) ([place])  J84.112   2. Therapeutic drug monitoring  Z51.81        Plan:     Patient Instructions     ICD-10-CM   1. IPF (idiopathic pulmonary fibrosis) ([place])  J84.112   2. Therapeutic drug monitoring  Z51.81      You are having progressive disease since November 2020 -> May 2021. Then also due to nausea and weight loss we stopped ofev. You have been on esbriet since early June 2021  Currently lung function is stable between May 2021 and September 2021 -this is on pirfenidone  You are having some nausea this is being controlled with help of Zofran as needed  Plan -Do simple walk test today -Check liver function test today and repeat liver function test again in 1 month -Continue pirfenidone/Esbriet -but increase the spacing between dosing -Use Zofran as needed for nausea -Repeat high-resolution CT scan of the  chest in 3 months [last one was in October 2020]  -Supine and prone   Follow-up -3 months to see Dr. Chase Fuentes face-to-face for a 30-minute visit but after high-resolution CT scan of the chest  - at at the  time of follow-up we will need ILD symptom score and simple walk test     SIGNATURE    Dr. Brand Males, M.D., F.C.C.P,  Pulmonary and Critical Care Medicine Staff Physician, [place] Director - Interstitial Lung Disease  Program  [facility] [place], [place], [address]  Pager: [phone], If no answer or between  15:00h - 7:00h: call [phone] Telephone: [phone]  11:01 AM 11/01/2019

## 2019-11-10 ENCOUNTER — Telehealth: Payer: Self-pay | Admitting: Internal Medicine

## 2019-11-10 NOTE — Telephone Encounter (Signed)
LFT normal on esbriet   Notification of test results are managed in the following manner: If there are any recommendations or changes to the plan of care discussed in office today, we will contact you and let you know what they are. If you do not hear from [place], then your results are normal and you can view them through your MyChart account , or a letter will be sent to you. Thank you again for trusting [place] with your care, Dr Duard [facility].   Called and spoke with pt letting him know the results of labwork and he verbalized understanding.nothing further needed.

## 2019-11-18 DIAGNOSIS — Z23 Encounter for immunization: Secondary | ICD-10-CM | POA: Diagnosis not present

## 2019-11-18 DIAGNOSIS — K5901 Slow transit constipation: Secondary | ICD-10-CM | POA: Diagnosis not present

## 2019-12-01 ENCOUNTER — Other Ambulatory Visit (INDEPENDENT_AMBULATORY_CARE_PROVIDER_SITE_OTHER): Payer: PPO

## 2019-12-01 DIAGNOSIS — Z5181 Encounter for therapeutic drug level monitoring: Secondary | ICD-10-CM

## 2019-12-01 DIAGNOSIS — J84112 Idiopathic pulmonary fibrosis: Secondary | ICD-10-CM

## 2019-12-01 LAB — HEPATIC FUNCTION PANEL
ALT: 13 U/L (ref 0–53)
AST: 20 U/L (ref 0–37)
Albumin: 4.1 g/dL (ref 3.5–5.2)
Alkaline Phosphatase: 84 U/L (ref 39–117)
Bilirubin, Direct: 0.1 mg/dL (ref 0.0–0.3)
Total Bilirubin: 0.5 mg/dL (ref 0.2–1.2)
Total Protein: 7.1 g/dL (ref 6.0–8.3)

## 2019-12-02 DIAGNOSIS — Z79899 Other long term (current) drug therapy: Secondary | ICD-10-CM | POA: Diagnosis not present

## 2019-12-02 DIAGNOSIS — F321 Major depressive disorder, single episode, moderate: Secondary | ICD-10-CM | POA: Diagnosis not present

## 2019-12-02 DIAGNOSIS — F411 Generalized anxiety disorder: Secondary | ICD-10-CM | POA: Diagnosis not present

## 2019-12-02 DIAGNOSIS — I10 Essential (primary) hypertension: Secondary | ICD-10-CM | POA: Diagnosis not present

## 2019-12-02 DIAGNOSIS — K5901 Slow transit constipation: Secondary | ICD-10-CM | POA: Diagnosis not present

## 2019-12-02 DIAGNOSIS — E7800 Pure hypercholesterolemia, unspecified: Secondary | ICD-10-CM | POA: Diagnosis not present

## 2019-12-02 DIAGNOSIS — I2510 Atherosclerotic heart disease of native coronary artery without angina pectoris: Secondary | ICD-10-CM | POA: Diagnosis not present

## 2019-12-02 DIAGNOSIS — Z0001 Encounter for general adult medical examination with abnormal findings: Secondary | ICD-10-CM | POA: Diagnosis not present

## 2019-12-02 DIAGNOSIS — J84112 Idiopathic pulmonary fibrosis: Secondary | ICD-10-CM | POA: Diagnosis not present

## 2019-12-03 NOTE — Progress Notes (Signed)
LFT normal on anti fibrotic.,. Continu eanti fibrotic. Will not call with resul

## 2019-12-05 ENCOUNTER — Other Ambulatory Visit: Payer: Self-pay | Admitting: Pharmacist

## 2019-12-05 ENCOUNTER — Other Ambulatory Visit: Payer: Self-pay | Admitting: Internal Medicine

## 2019-12-05 ENCOUNTER — Telehealth: Payer: Self-pay | Admitting: Internal Medicine

## 2019-12-05 DIAGNOSIS — J84112 Idiopathic pulmonary fibrosis: Secondary | ICD-10-CM

## 2019-12-05 MED FILL — ESBRIET 267 MG TABS: 267 | 30 days supply | Qty: 270 | Fill #0

## 2019-12-05 NOTE — Telephone Encounter (Signed)
Routing refill request to clinical staff

## 2019-12-06 NOTE — Telephone Encounter (Signed)
I am unable to send in prescriptions, but Mendel Ryder sent in the corrected prescription yesterday. Nothing further needed.

## 2019-12-10 ENCOUNTER — Ambulatory Visit: Payer: Self-pay | Attending: Internal Medicine

## 2019-12-10 DIAGNOSIS — Z23 Encounter for immunization: Secondary | ICD-10-CM

## 2019-12-10 NOTE — Progress Notes (Signed)
   Covid-19 Vaccination Clinic  Name:  Jaythen Hamme    MRN: [id number] DOB: 1951/02/17  12/10/2019  Ms. Quevedo was observed post Covid-19 immunization for 15 minutes without incident. She was provided with Vaccine Information Sheet and instruction to access the V-Safe system.   Ms. Bazzi was instructed to call 911 with any severe reactions post vaccine: Marland Kitchen Difficulty breathing  . Swelling of face and throat  . A fast heartbeat  . A bad rash all over body  . Dizziness and weakness

## 2019-12-27 ENCOUNTER — Telehealth: Payer: Self-pay | Admitting: Internal Medicine

## 2019-12-28 NOTE — Telephone Encounter (Signed)
Spoke to pt & made him aware we have not started scheduling our Dec CT's yet.  We will be calling him in the next few weeks with CT appt.  Nothing further needed at this time.

## 2020-01-04 MED FILL — ESBRIET 267 MG TABS: 267 | 30 days supply | Qty: 270 | Fill #1

## 2020-01-16 ENCOUNTER — Telehealth: Payer: Self-pay | Admitting: Internal Medicine

## 2020-01-16 NOTE — Telephone Encounter (Addendum)
Scheduled pt's CT for 12/14 at 10:30.  Gave him appt info.  Nothing further needed.

## 2020-01-24 ENCOUNTER — Telehealth: Payer: Self-pay | Admitting: Internal Medicine

## 2020-01-24 NOTE — Telephone Encounter (Signed)
I agree with the pharmacist that the pirfenidone/Esbriet is likely cause of weight loss in the GI symptoms   This is quite unfortunate  Plan -Stop pirfenidone/Esbriet immediately -Please ask Eduardo Fuentes his current weight and documented in the chart -We will reassess his weight when he returns for follow-up mid December 2021 and at that time I can discuss future options which include rechallenge with nintedanib or pirfenidone or clinical trials

## 2020-01-24 NOTE — Telephone Encounter (Signed)
Spoke with pt, states that since switching to Esbriet in June of this year, pt has had recurring bouts of constipation Xseveral days, then will have a day of diarrhea.  States that he has lost "several pounds" since this has been happening.   Pt denies any other new medication starts since experiencing these symptoms.  States he sees Dr. Henrene Pastor GI and had an endoscopy/colonoscopy this spring but has not seen him since.  Pt had reached out to pharmacy at [facility] (pharmacy who provides his Esbriet) and was advised that this could be caused by this medication, and was advised to call our office for further advice.  Pt states he was switched from Ofev to Esbriet d/t weight loss, but notes that he's had more weight loss on Esbriet than he has on Ofev.    MR please advise on recs.  Thanks!

## 2020-01-24 NOTE — Telephone Encounter (Signed)
atc pt X3, line rang to fast busy signal.  Wcb.  

## 2020-01-24 NOTE — Telephone Encounter (Signed)
The pt states that he has begun to take a new medication "ESBRIET"  for pulmonary fibrosis and has been getting constipated.  He will begin taking miralax daily and titrate as needed.  He will also call the pulmonologist and make them aware of the issue.  He has also been scheduled for a follow up with Dr Henrene Pastor in Jan. He will call back if needed after speaking with the pulmonologist.

## 2020-01-24 NOTE — Telephone Encounter (Signed)
Patient is returning phone call. Patient phone number is [phone].

## 2020-01-24 NOTE — Telephone Encounter (Signed)
Spoke with pt, aware of recs.   Pt states that his weight is in the low 170's.  Can tell he's lost weight by the way his clothing fits.    Forwarding to MR as FYI.

## 2020-02-07 ENCOUNTER — Ambulatory Visit (INDEPENDENT_AMBULATORY_CARE_PROVIDER_SITE_OTHER)
Admission: RE | Admit: 2020-02-07 | Discharge: 2020-02-07 | Disposition: A | Discharge status: Home / Self Care | Payer: PPO | Source: Ambulatory Visit | Attending: Internal Medicine | Admitting: Internal Medicine

## 2020-02-07 ENCOUNTER — Other Ambulatory Visit: Payer: Self-pay

## 2020-02-07 DIAGNOSIS — I2510 Atherosclerotic heart disease of native coronary artery without angina pectoris: Secondary | ICD-10-CM | POA: Diagnosis not present

## 2020-02-07 DIAGNOSIS — J84112 Idiopathic pulmonary fibrosis: Secondary | ICD-10-CM

## 2020-02-07 DIAGNOSIS — M47814 Spondylosis without myelopathy or radiculopathy, thoracic region: Secondary | ICD-10-CM | POA: Diagnosis not present

## 2020-02-07 DIAGNOSIS — J479 Bronchiectasis, uncomplicated: Secondary | ICD-10-CM | POA: Diagnosis not present

## 2020-02-09 ENCOUNTER — Other Ambulatory Visit: Payer: Self-pay

## 2020-02-09 ENCOUNTER — Ambulatory Visit: Payer: PPO | Admitting: Internal Medicine

## 2020-02-09 ENCOUNTER — Encounter: Payer: Self-pay | Admitting: Internal Medicine

## 2020-02-09 VITALS — BP 122/82 | HR 84 | Temp 97.3°F | Ht 68.0 in | Wt 189.4 lb

## 2020-02-09 DIAGNOSIS — J84112 Idiopathic pulmonary fibrosis: Secondary | ICD-10-CM

## 2020-02-09 DIAGNOSIS — Z5181 Encounter for therapeutic drug level monitoring: Secondary | ICD-10-CM

## 2020-02-09 DIAGNOSIS — R634 Abnormal weight loss: Secondary | ICD-10-CM | POA: Diagnosis not present

## 2020-02-09 DIAGNOSIS — T50905A Adverse effect of unspecified drugs, medicaments and biological substances, initial encounter: Secondary | ICD-10-CM

## 2020-02-09 NOTE — Progress Notes (Signed)
Primary Care Provider: Lujean Amel, MD Cardiologist: Glenetta Hew, MD Electrophysiologist: None  Pulmonologist: Dr. Chase Caller GI: Dr. Henrene Pastor Urology: Dr. Serita Butcher Neuro: Dr. Jannifer Franklin Psych: Dr. Carolee Rota 01/29/2018  Chief Complaint  Patient presents with   Pulmonary Consult    self referral, abnormal CT, ? ILD from CT scan, SOB with exertion     Eduardo Fuentes presents self-referral for interstitial lung disease.  His wife Eduardo Fuentes used to be seen by me some years ago when she had surgical lung biopsy for ruling out interstitial lung disease which we did.  Patient is a non-smoker but has long suffered from acid reflux and esophageal stenosis.  He also has a sliding hiatal hernia.  He says since spring 2019 he has had insidious onset of shortness of breath that is progressive associated with some cough.  During this time he is also had worsening endogenous depression.  He was seeing a psychiatrist multiple antidepressants have failed.  Electroconvulsive therapy has been recommended and he had one on January 27, 2018 2 days ago.  During this time a chest x-ray was done as part of pre-electroconvulsive therapy evaluation and this suggested interstitial lung disease.  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  I agree with the radiologist.  The some amount of mediastinal adenopathy that is felt to be reactive.  There is also a sliding hiatal hernia.  It is described that these findings are new compared to 2014 chest x-ray.  At this point in time walking desaturation test he did not desaturate but dropped 4 points and pulse ox    [employer] Integrated Comprehensive ILD Questionnaire  Symptoms: He reports shortness of breath onset gradually for the last 1 year.  It is getting worse with time.  He does have episodic dyspnea.  When he does shopping it is level 1.  When he walks on a level ground  with others of his age at this level 1 walking up stairs at this level 1 but walking at his own pace at this level 2 and taking a shower at level 3.  He does have associated arthralgia and cough.  He is not sure when the cough started.  It is present at all positions since it started it is the same as moderate in intensity.  He does cough at night.  He does bring up phlegm which is clear in color.  It gets worse when he lies down and it affects his voice and he does clear his throat.  He does not feel a tickle.  There is no wheezing.   Past Medical History : This is positive for major depression but he circled no for asthma, COPD.  Surgical no for heart failure.  Surgical no for collagen vascular disease with sleep apnea.  He did not answer for acid reflux which he does have..  Denies diabetes or thyroid disease or heart disease or pulmonary embolism or pneumonia   ROS:  .  Positive for difficulty swallowing due to his esophageal stricture history.  Does have nausea and acid reflux and snoring.  Otherwise no vomiting is positive for soreness across the body after electroconvulsive therapy 2 days ago.   FAMILY HISTORY of LUNG DISEASE: Denies family history of any pulmonary fibrosis COPD or asthma sarcoidosis   EXPOSURE HISTORY: Denies smoking cigarettes or tobacco or marijuana or cocaine.  No intravenous drug abuse.   HOME  and HOBBY DETAILS : He did not list the age of the home but is lived in this home for 25 [place].  There is no mold in the house of mildew.  The house is not damp.  Does not use a CPAP mask.  No nebulizer machine.  No use of Jacuzzi.  Nose fountain in the house.  No pet birds or gerbils.  Does not use feather pillows.  He is not sure if there is mold in the [facility] duct system.  Does not play wind instruments.  He occasionally mows the lawn   OCCUPATIONAL HISTORY (122 questions) : Positive for social gardening.  Did report that he is used feather pillows and blankets  in the past but otherwise negative including working in a dusty environment   [place] (27 items): Positive for cancer chemotherapy but he does not know the details      OV 03/16/2018  Subjective:  Patient ID: Eduardo Fuentes, Eduardo Fuentes , DOB: August 01, 1950 , age 69 y.o. , MRN: [id number] , ADDRESS: [place] [address]   03/16/2018 -   Chief Complaint  Patient presents with   Follow-up    Pt started OFEV 02/27/2018.  Pt states he has been doing okay since last visit. Denies any current complaints with the OFEV. Pt states he does have SOB which is mainy with activities.   IPF [clinical diagnosis based on classic UIP scan negative serology and age and Eduardo Fuentes gender; diagnosis given February 11, 2017]  HPI Eduardo Fuentes 69 y.o. -IPF follow-up.  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  He was started on nintedanib.  He started this on February 27, 2018.  Suffice tolerating it well without any problems.  He is debating about starting pulmonary rehabilitation.  Mostly does not want to pay $20 a week for this.  The other alternative is that he can go [facility] [employer] and exercise by himself without any co-pay but there is no trainer.  I asked him to look at rehab as a short-term thing with the help of a trainer and the cost of a trainer being $20 a week.  He then opened up to the idea of attending pulmonary rehabilitation.  He still and his wife both had questions about IPF and etiology.  Be on sliding hiatal hernia but not able to discover anything else.  His reflux is better controlled now with PPI.  In terms of her shortness of breath this is stable.  In terms of his cough this is stable.  He continues to deal with depression but is not suicidal or homicidal.  His walking desaturation test today shows stability.  Wife wanted some education material about IPF and nintedanib.         OV 04/29/2018  Subjective:  Patient ID: Eduardo Fuentes, Eduardo Fuentes , DOB:  [birth year] , age 69 y.o. , MRN: [id number] , ADDRESS: [place] [address]   04/29/2018 -   Chief Complaint  Patient presents with   Follow-up    Pt states he has had some weakness going on which he states could be due to not eating enough with the OFEV. Pt also has had some loose stools as well as nausea.    IPF [clinical diagnosis based on classic UIP scan negative serology and age and Eduardo Fuentes gender; diagnosis given February 11, 2017 and on Ofev since 02/27/2018   HPI Eduardo Fuentes 69 y.o. -presents  for follow-up of his IPF.  He is now doing nintedanib for 2 months.  Overall stable.  Overall tolerating nintedanib really well.  He had some low appetite.  There.  Some nausea he had and that yesterday he had some diarrhea after eating pizza.  He thinks it is a pizza that caused the diarrhea not the nintedanib.  In terms of his dyspnea he stable quality of life is stable.  He said he is doing well with pulmonary rehabilitation.  Maintain his pulse ox 90% or so.  He is asking if the nintedanib is helping him in terms of disease stability.  Discussed that this might not be known fully in an individual.  His mood is better   Results for JIA, MOHAMED (MRN [id number]) as of 03/16/2018 14:30  Ref. Range 01/29/2018 12:42  ASPERGILLUS FUMIGATUS Latest Ref Range: NEGATIVE  NEGATIVE  Pigeon Serum Latest Ref Range: NEGATIVE  NEGATIVE  Anti Nuclear Antibody(ANA) Latest Ref Range: NEGATIVE  POSITIVE (A)  ANA Pattern 1 Unknown Nuclear, Homogeneous (A)  ANA Titer 1 Latest Units: titer 9:38 (H)  Cyclic Citrullin Peptide Ab Latest Units: UNITS <16  RA Latex Turbid. Latest Ref Range: <14 IU/mL <14   ROS - per HPI  Results for BRIYAN, KLEVEN (MRN [id number]) as of 03/16/2018 14:54  Ref. Range 03/08/2018 11:50  AST Latest Ref Range: 0 - 37 U/L 23  ALT Latest Ref Range: 0 - 53 U/L 15  Total Protein Latest Ref Range: 6.0 - 8.3 g/dL 7.2    ROS - per HPI    OV 04/01/2019  Subjective:   Patient ID: Eduardo Fuentes, Eduardo Fuentes , DOB: February 20, 1951 , age 62 y.o. , MRN: [id number] , ADDRESS: [place] [address]   04/01/2019 -   Chief Complaint  Patient presents with   Follow-up    Pt states hes been feeling ok but has had some nausea after breakfast and morning nap. Very little cough. SOB on exertion. Pt denies any wheezing, fever, or chills.    IPF [clinical diagnosis based on classic UIP scan negative serology and age and Eduardo Fuentes gender; diagnosis given February 11, 2017 and on Ofev since 02/27/2018  HPI Eduardo Fuentes 68 y.o. -on this telephone visit reports nausea with nintedanib in the mornings.  He has a very light breakfast that has applesauce.  He does not eat much then after the breakfast at 9:00 he has some nausea that is mild to moderate in intensity.  Documented below with a question elicited a few days ago.  In terms of shortness of breath he is stable.  He is asking about exercising.  Otherwise he feels good.  At night only occasionally has nausea.  He has enough refills of Zofran but is not taking it.  I saw him a few days ago when he was in the office with his wife and at that time unofficial lung exam showed stable crackles and he looked well.  There is no evidence of ongoing depression or cognitive ability.  His memory is very sharp.     IMPRESSION: 1. The appearance of the lungs is compatible with interstitial lung disease, with a spectrum of findings considered probable usual interstitial pneumonia (UIP) per current ATS guidelines. No significant progression of disease compared to the prior study. 2. Aortic atherosclerosis, in addition to left main and 3 vessel coronary artery disease. Please note that although the presence of coronary artery calcium documents the presence of coronary artery disease, the severity  of this disease and any potential stenosis cannot be assessed on this non-gated CT examination. Assessment for potential risk factor  modification, dietary therapy or pharmacologic therapy may be warranted, if clinically indicated.  Aortic Atherosclerosis (ICD10-I70.0).   Electronically Signed   By: Vinnie Langton M.D.   On: 12/08/2018 15:10 ROS - per HPI  Results for SESAR, MADEWELL (MRN [id number]) as of 04/01/2019 09:43  Ref. Range 01/17/2019 12:25  AST Latest Ref Range: 0 - 37 U/L 27  ALT Latest Ref Range: 0 - 53 U/L 21     OV 07/20/2019  Subjective:  Patient ID: Eduardo Fuentes, Eduardo Fuentes , DOB: 24-Jul-1950 , age 79 y.o. , MRN: [id number] , ADDRESS: [place] [address]  IPF [clinical diagnosis based on classic UIP scan negative serology and age and Eduardo Fuentes gender; diagnosis given February 11, 2017 and on Ofev since 02/27/2018   07/20/2019 -   Chief Complaint  Patient presents with   Follow-up    pt is here to go over pft results. pt states nausea with the ofev mes     HPI Eduardo Fuentes 69 y.o. -was stable at the time of last visit in February 2021. Because of coronary artery calcification I referred him to cardiology. Reviewed Dr. Shanon Brow Harding's notes. No indication for stress test. At that visit he had some nausea that was moderate. He then saw gastroenterologist Dr. Scarlette Shorts. Early in May 2021 this month he underwent upper endoscopy and lower endoscopy. I have reviewed the notes and visualize the results. These are normal. The nausea is felt to be due to drug. He is got moderate nausea although the dyspnea itself is same. He feels it is because of nintedanib. In fact he is also lost weight over 10 pounds since the last 1 year. He is not sure why. Nevertheless he is quite active. From a dyspnea disease standpoint he feels the same but his pulmonary function test shows decline of at least 6% and FVC and 13% and DLCO and since November 2020. He is worried about this.       OV 11/01/2019   Subjective:  Patient ID: Eduardo Fuentes, Eduardo Fuentes , DOB: 11-26-1950, age 59 y.o. years. , MRN:  [id number],  ADDRESS: [place] [address] PCP  Lujean Amel, MD Providers : Treatment Team:  Attending Provider: Brand Males, MD   Chief Complaint  Patient presents with   Follow-up    get pft results.  cough worse,dry cough.     IPF [clinical diagnosis based on classic UIP scan negative serology and age and Eduardo Fuentes gender; diagnosis given February 11, 2017 and on Ofev since 02/27/2018.  Stopped nintedanib May 2021.  Started pirfenidone approximately June 3 or July 29, 2019.  Last CT scan of the chest mid October 2020    HPI Eduardo Fuentes 69 y.o. -presents for follow-up.  At last visit when he saw me face-to-face in May 2020 when I switched his nintedanib to pirfenidone.  This because of nausea and GI side effects and weight loss and also decline in lung function.  Since early June 2020 when he has been on pirfenidone.  He has some nausea with this but is well controlled with Zofran.  He stopped losing weight.  His weight is stable.  He does feel his cough at night is slightly worse than usual but otherwise his shortness of breath and activities are good.  He works out in [employer] wearing a mask.  He has had his Covid vaccine.  He has no change in his effort tolerance.  His last CT scan of the chest was in mid October 2020 and his ILD was stable.  He is asking about repeating another CT chest.  He is due for liver function test today.  He also wants to have a walk test today.       PFT Results Latest Ref Rng & Units 11/01/2019 07/18/2019 01/17/2019 08/25/2018 02/11/2018  FVC-Pre L 2.75 2.65 2.82 2.69 2.90  FVC-Predicted Pre % 63 61 65 62 67  FVC-Post L - - - - 2.52  FVC-Predicted Post % - - - - 58  Pre FEV1/FVC % % 79 83 85 73 86  Post FEV1/FCV % % - - - - 70  FEV1-Pre L 2.18 2.20 2.40 1.95 2.50  FEV1-Predicted Pre % 68 69 75 61 78  FEV1-Post L - - - - 1.77  DLCO uncorrected ml/min/mmHg 16.68 12.67 14.84 14.69 -  DLCO UNC% % 65 50 58 57 -  DLCO corrected  ml/min/mmHg 16.68 12.67 - - -  DLCO COR %Predicted % 65 50 - - -  DLVA Predicted % 109 83 88 97 -  TLC L - - - - 3.41  TLC % Predicted % - - - - 50  RV % Predicted % - - - - 11      OV 02/09/2020  Subjective:  Patient ID: Eduardo Fuentes, Eduardo Fuentes , DOB: [birth year] , age 4 y.o. , MRN: [id number] , ADDRESS: [place] [address] PCP Lujean Amel, MD Patient Care Team: Lujean Amel, MD as PCP - General (Family Medicine) Leonie Man, MD as PCP - Cardiology (Cardiology)  This Provider for this visit: Treatment Team:  Attending Provider: Brand Males, MD    IPF [clinical diagnosis based on classic UIP scan negative serology and age and Eduardo Fuentes gender; diagnosis given February 11, 2017   - n Ofev since 02/27/2018.  Stopped nintedanib May 2021 due to weight loss  - .  Started pirfenidone approximately June 3 or July 29, 2019. -> stopped nov 2021 due to weight loss (worse than with ofev) and GI side effects   -> wants to restart ofev 02/09/2020      Last CT scan of the chest mid October 2020 -> Dec 2021 with stabiliuty    02/09/2020 -   Chief Complaint  Patient presents with   Follow-up    ILD, IPF, doing well     HPI Eduardo Fuentes 69 y.o. -returns for follow-up. After last visit he called in with progressive weight loss and stopped his pirfenidone. Significant GI side effects and worsening weight loss.  some 2 weeks ago he stopped his pirfenidone. After that he has gained weight. His GI side effects have resolved. His wife is here with him. She says he was miserable with pirfenidone. On his CT scan of the chest his last 1 year CT scan shows stability. His recent PFTs also show stability. I did express to him that the suggested antifibrotic was likely being effective although he was extremely miserable. He wanted know his future options. Discussed both antifibrotic's of the two approved standard of care medications and his significant side effects with both  of them. After this he expressed interest to rechallenge with nintedanib. I did indicate to him that there was high probability that he would have side effects again with nintedanib. He told me that side effects with pirfenidone was significantly worse. And that  with nintedanib it wasn't that significant. He definitely wants to rechallenge himself with nintedanib. I told him I would support that and will closely monitor. He then wanted to ask about other care options. We discussed the potential for research clinical trials as a care option. Explained to him that given the fact that he has had overall progressive disease but recent stability but now intolerant to both antifibrotic that he should look at advanced phase 3 trial. Explained that there are two options both IV infusions one is called to starscape study by [place]. The other is Pamrevulmab by  [employer] which is a monoclonal antibody against connective tissue growth factor. We discussed several aspects of clinical trials. Explained to him that the known side effect profile and safety profile of this drug would be in his best interest. Explained that he could try to expedite therapeutic option with these trials but the primary purpose would be of human volunteer. We discussed the below    1. Scientific Purpose  Clinical research is designed to produce generalizable knowledge and to answer questions about the safety and efficacy of intervention(s) under study in order to determine whether or not they may be useful for the care of future patients.  2. Study Procedures  Participation in a trial may involve procedures or tests, in addition to the intervention(s) under study, that are intended only or primarily to generate scientific knowledge and that are otherwise not necessary for patient care.   3. Uncertainty  For intervention(s) under study in clinical research, there often is less knowledge and more uncertainty about the risks and benefits to  a population of trial participants than there is when a doctor offers a patient standard interventions.   4. Adherence to Protocol  Administration of the intervention(s) under study is typically based on a strict protocol with defined dose, scheduling, and use or avoidance of concurrent medications, compared to administration of standard interventions.  5. Clinician as Investigator  Clinicians who are in health care settings provide treatment; in a clinical trial setting, they are also investigating safety and efficacy of an intervention. In otherwise your doctor or nurse practitioner can be wearing 2 hats - one as care giver another as Company secretary  6. Patient as Visual merchandiser Subject  Patients participating in research trials are research subjects or volunteers. In other words participating in research is 100% voluntary and at one's own free weill. The decision to participate or not participate will NOT affect patient care and the doctor-patient relationship in any way  7. Financial Conflict of Interest Disclosure  One or more of the investigators of  the research trial might have an investment interest in [employer], [facility] the clinical trials practice through which the study is being conducted. Study sponsor pays the practice for the conduct of the study.           SYMPTOM SCALE - ILD 03/28/2019  07/20/2019 - 180# Stop pfev 11/01/2019 182# start esbriet in June 2021 02/09/2020 189# - stopped esbriet 01/24/31 due to weight loss  O2 use ra ra    Shortness of Breath 0 -> 5 scale with 5 being worst (score 6 If unable to do)     At rest 0 0  0  Simple tasks - showers, clothes change, eating, shaving 0 0  0  Household (dishes, doing bed, laundry) 1 1  1   Shopping 1 0  1  Walking level at own pace 1 1  1   Walking up Stairs 2  2  3  Total (30-36) Dyspnea Score 5 4  6   How bad is your cough? 1 1  1   How bad is your fatigue 1 1  2   How bad is nausea 3 2  2   How bad is  vomiting?  0 0  0  How bad is diarrhea? 1 1  1   How bad is anxiety? 1 1  1   How bad is depression 1 0  2       01/29/2018  03/16/2018 190# 04/29/2018 193# 07/20/2019 180# 11/01/2019 182# 02/09/2020 189#  O2 used Room air rooom air Room air   room air   Number laps completed 3 3 3    three   Comments about pace 250 feet x 3 laps on POD B at [employer] pace Normal pace   normal pace   Resting Pulse Ox/HR 100% and 89/min 100% and 110/min 96% and 93/min 97% and 93/min 100% and HR 90/min 97% and 80/,in  Final Pulse Ox/HR 96% and 109/min 97% and 118/min 91% and 107/min 90% and 110/min 93% and 106/minm 92% and 90/min  Desaturated </= 88% no no no     Desaturated <= 3% points Yes, 4poin Yes, 3 poitns Yes, 5 points Yes, 7 points Yes, 7 points Yes, 5 points  Got Tachycardic >/= 90/min yes yes yes  yes yes  Symptoms at end of test no Mild dyspnea Mild dyspnea   No dyspnea  Miscellaneous comments no x x Likely worse stable stable     CT Chest data  CT Chest High Resolution  Result Date: 02/07/2020 CLINICAL DATA:  Follow-up interstitial lung disease. EXAM: CT CHEST WITHOUT CONTRAST TECHNIQUE: Multidetector CT imaging of the chest was performed following the standard protocol without intravenous contrast. High resolution imaging of the lungs, as well as inspiratory and expiratory imaging, was performed. COMPARISON:  12/08/2018 high-resolution chest CT. FINDINGS: Cardiovascular: Normal heart size. No significant pericardial effusion/thickening. Three-vessel coronary atherosclerosis. Atherosclerotic nonaneurysmal thoracic aorta. Stable top-normal caliber main pulmonary artery (3.4 cm diameter). Mediastinum/Nodes: No discrete thyroid nodules. Unremarkable esophagus. No axillary adenopathy. Mild paratracheal adenopathy up to the 1.5 cm on the right (series 2/image 45), stable. Stable mildly enlarged 1.1 cm subcarinal node (series 2/image 70). Stable mild AP window adenopathy up to 1.3 cm  (series 2/image 52). Stable mild bilateral hilar adenopathy, poorly delineated on these noncontrast images. Lungs/Pleura: No pneumothorax. No pleural effusion. No acute consolidative airspace disease or lung masses. Stable solid 0.5 cm posterior left upper lobe nodule (series 3/image 69). No new significant pulmonary nodules. No significant lobular air trapping or evidence of tracheobronchomalacia on the expiration sequence. Extensive patchy confluent subpleural reticulation, septal thickening and ground-glass opacity throughout both lungs with associated moderate traction bronchiectasis, architectural distortion and volume loss. Mild basilar predominance to these findings. Mild honeycombing is present in the dependent lower lobes bilaterally. No compelling evidence of interval progression since 12/08/2018 CT. Findings appear slightly progressed since 01/20/2018 baseline chest CT. Upper abdomen: Subcentimeter hypodense right liver lesions are too small to characterize and are unchanged considered benign. Musculoskeletal: No aggressive appearing focal osseous lesions. Moderate thoracic spondylosis. IMPRESSION: 1. Spectrum of findings compatible with basilar predominant fibrotic interstitial lung disease with mild honeycombing and no compelling evidence of interval progression since 12/08/2018 CT. Findings appear slightly progressed since 01/20/2018 baseline chest CT. Findings are consistent with UIP per consensus guidelines: Diagnosis of Idiopathic Pulmonary Fibrosis: An Official ATS/ERS/JRS/ALAT Clinical Practice Guideline. [place], Iss  5, [id number], Oct 25 2016. 2. Stable mild mediastinal and bilateral hilar lymphadenopathy, most compatible with benign reactive adenopathy. 3. Three-vessel coronary atherosclerosis. 4. Aortic Atherosclerosis (ICD10-I70.0). Electronically Signed   By: Ilona Sorrel M.D.   On: 02/07/2020 13:50      PFT  PFT Results Latest Ref Rng & Units 11/01/2019  07/18/2019 01/17/2019 08/25/2018 02/11/2018  FVC-Pre L 2.75 2.65 2.82 2.69 2.90  FVC-Predicted Pre % 63 61 65 62 67  FVC-Post L - - - - 2.52  FVC-Predicted Post % - - - - 58  Pre FEV1/FVC % % 79 83 85 73 86  Post FEV1/FCV % % - - - - 70  FEV1-Pre L 2.18 2.20 2.40 1.95 2.50  FEV1-Predicted Pre % 68 69 75 61 78  FEV1-Post L - - - - 1.77  DLCO uncorrected ml/min/mmHg 16.68 12.67 14.84 14.69 -  DLCO UNC% % 65 50 58 57 -  DLCO corrected ml/min/mmHg 16.68 12.67 - - -  DLCO COR %Predicted % 65 50 - - -  DLVA Predicted % 109 83 88 97 -  TLC L - - - - 3.41  TLC % Predicted % - - - - 50  RV % Predicted % - - - - 11       has a past medical history of Agatston CAC score, <100 (04/2018), Arthritis, Cough, Depressive disorder (10/2011), Diverticulosis, Diverticulosis of colon, Essential hypertension, Generalized anxiety disorder, GERD (gastroesophageal reflux disease), Hemorrhoid, Hiatal hernia, History of colon polyps, History of kidney stones, 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   reports that he has never smoked. He has never used smokeless tobacco.  Past Surgical History:  Procedure Laterality Date   COLONOSCOPY  03/04/2013   ESOPHAGOGASTRODUODENOSCOPY     EXTRACORPOREAL SHOCK WAVE LITHOTRIPSY  YRS AGO   PROSTATE BIOPSY     RADIOACTIVE SEED IMPLANT N/A 02/02/2013   Procedure: RADIOACTIVE SEED IMPLANT;  Surgeon: Molli Hazard, MD;  Location: [facility];  Service: Urology;  Laterality: N/A;   TONSILLECTOMY AND ADENOIDECTOMY  AS CHILD   UPPER GASTROINTESTINAL ENDOSCOPY  2019    Allergies  Allergen Reactions   Aleve [Naproxen Sodium] Anaphylaxis and Other (See Comments)    GI UPSET Patient does not remember anaphylaxis   Hyzaar [Losartan Potassium-Hctz] Other (See Comments)    Felt bad    Lisinopril Cough   Demerol [Meperidine] Rash          Immunization History  Administered Date(s) Administered   Fluad Quad(high Dose 65+) 12/30/2018   Influenza Inj Mdck Quad Pf 11/09/2017   Influenza Split 12/05/2005, 11/27/2010, 12/19/2011, 11/26/2012   Influenza,inj,Quad PF,6+ Mos 10/29/2016, 11/09/2017, 11/18/2018, 11/18/2019   Influenza,inj,quad, With Preservative 10/25/2013, 11/27/2014, 10/26/2015   PFIZER SARS-COV-2 Vaccination 04/02/2019, 04/27/2019, 12/10/2019   Pneumococcal Conjugate-13 10/26/2015   Pneumococcal Polysaccharide-23 10/29/2016   Td 02/24/1998   Tdap 07/15/2011   Zoster 03/19/2012   Zoster Recombinat (Shingrix) 12/03/2018, 02/01/2019    Family History  Problem Relation Age of Onset   Dementia Mother    Anxiety disorder Mother    Alzheimer's disease Mother    Bone cancer Father        Metastatic prostate   Cancer Father        prostate   Heart attack Father 70       Referred for CABG   Anxiety disorder Sister    Colon cancer Neg Hx    Esophageal cancer Neg Hx  Rectal cancer Neg Hx    Stomach cancer Neg Hx    Colon polyps Neg Hx      Current Outpatient Medications:    linaclotide (LINZESS) 145 MCG CAPS capsule, 1 capsule at least 30 minutes before the first meal of the day on an empty stomach, Disp: , Rfl:    clonazePAM (KLONOPIN) 1 MG tablet, Take 1 tablet at 8 am, 1/2 tablet at 2 pm and 1 tablet at bedtime for anxiety. (Patient taking differently: TAKES 2-3 TABLETS EACH DAY.), Disp: 75 tablet, Rfl: 0   ESBRIET 267 MG TABS, TAKE 3 TABLETS (801 MG TOTAL) BY MOUTH WITH BREAKFAST, WITH LUNCH, AND WITH EVENING MEAL. (Patient not taking: Reported on 02/09/2020), Disp: 270 tablet, Rfl: 2   gabapentin (NEURONTIN) 300 MG capsule, Take 600 mg by mouth 2 (two) times daily. , Disp: , Rfl:    hydrochlorothiazide (HYDRODIURIL) 12.5 MG tablet, Take 12.5 mg by mouth daily., Disp: , Rfl:    mirtazapine (REMERON) 30 MG tablet, Take 30 mg by mouth at bedtime. , Disp: , Rfl: 4    nortriptyline (PAMELOR) 50 MG capsule, Take 50 mg by mouth at bedtime. , Disp: , Rfl:    ondansetron (ZOFRAN) 4 MG tablet, SMARTSIG:1-2 Tablet(s) By Mouth, Disp: , Rfl:    pantoprazole (PROTONIX) 40 MG tablet, TAKE 1 TABLET(40 MG) BY MOUTH DAILY, Disp: 90 tablet, Rfl: 1  Current Facility-Administered Medications:    0.9 %  sodium chloride infusion, 500 mL, Intravenous, Once, Irene Shipper, MD      Objective:   Vitals:   02/09/20 1513  BP: 122/82  Pulse: 84  Temp: (!) 97.3 F (36.3 C)  TempSrc: Oral  SpO2: 97%  Weight: 189 lb 6.4 oz (85.9 kg)  Height: 5\' 8"  (1.727 m)    Estimated body mass index is 28.8 kg/m as calculated from the following:   Height as of this encounter: 5\' 8"  (1.727 m).   Weight as of this encounter: 189 lb 6.4 oz (85.9 kg).  @WEIGHTCHANGE @  [employer]   02/09/20 1513  Weight: 189 lb 6.4 oz (85.9 kg)     Physical Exam   General: No distress. Stable. Good cognitiion Neuro: Alert and Oriented x 3. GCS 15. Speech normal Psych: Pleasant Resp:  Barrel Chest - no.  Wheeze - no, Crackles - velcro at base, No overt respiratory distress CVS: Normal heart sounds. Murmurs - no Ext: Stigmata of Connective Tissue Disease - no HEENT: Normal upper airway. PEERL +. No post nasal drip        Assessment:       ICD-10-CM   1. IPF (idiopathic pulmonary fibrosis) ([place])  J84.112   2. Therapeutic drug monitoring  Z51.81   3. Drug-induced weight loss  R63.4    T50.905A        Plan:     Patient Instructions     ICD-10-CM   1. IPF (idiopathic pulmonary fibrosis) ([place])  J84.112   2. Therapeutic drug monitoring  Z51.81   3. Drug-induced weight loss  R63.4    T50.905A     Pulmonary fibrosis overall progressive between 2019 and 2021 but is stable in the last 1 year between October 2020 and November/December 2021 suggesting the antifibrotic's in the last 1 year have helped you  However you're having significant side effects of weight loss and other GI  side effects with both antifibrotic's  It appears that pirfenidone had more side effects than nintedanib  Plan [based on shared decision making an  extensive discussion] -Restart nintedanib [respect the fact that he want to give this a try once again] -take 150 mg once daily through 02/24/2020 and then starting 02/25/2020 go up to 150 mg twice daily  -We will do a new prescription start -List pirfenidone as an allergy [weight loss and GI side effects] -Remove nintedanib from allergy list -Refer for clinical trials [we discussed extensively and will put you on consideration waiting list for IV infusion phase 3 trial)    Follow-up -4-6  weeks do liver function test -4-6 weeks return to see nurse practitioner to monitor your weight and any side effects from nintedanib -12 weeks to spirometry and DLCO -12 weeks return to see Dr. Chase Caller in 30-minute slot but after spirometry and DLCO  Results for Eduardo Fuentes, Eduardo Fuentes (MRN [id number]) as of 02/09/2020 16:12  Ref. Range 12/01/2019 10:45  Alkaline Phosphatase Latest Ref Range: 39 - 117 U/L 84  Albumin Latest Ref Range: 3.5 - 5.2 g/dL 4.1  AST Latest Ref Range: 0 - 37 U/L 20  ALT Latest Ref Range: 0 - 53 U/L 13  Total Protein Latest Ref Range: 6.0 - 8.3 g/dL 7.1  Bilirubin, Direct Latest Ref Range: 0.0 - 0.3 mg/dL 0.1  Total Bilirubin Latest Ref Range: 0.2 - 1.2 mg/dL 0.5   ( Level 05 visit: Estb 40-54 min *  in  visit type: on-site physical face to visit  in total care time and counseling or/and coordination of care by this undersigned MD - Dr Brand Males. This includes one or more of the following on this same day 02/09/2020: pre-charting, chart review, note writing, documentation discussion of test results, diagnostic or treatment recommendations, prognosis, risks and benefits of management options, instructions, education, compliance or risk-factor reduction. It excludes time spent by the [place] or office staff in the care of the patient. Actual  time 40 min)   SIGNATURE    Dr. Brand Males, M.D., F.C.C.P,  Pulmonary and Critical Care Medicine Staff Physician, [place] Director - Interstitial Lung Disease  Program  Pulmonary [place] at [place], [place], [address]  Pager: [phone], If no answer or between  15:00h - 7:00h: call [phone] Telephone: [phone]  4:11 PM 02/09/2020

## 2020-02-09 NOTE — Patient Instructions (Addendum)
ICD-10-CM   1. IPF (idiopathic pulmonary fibrosis) ([place])  J84.112   2. Therapeutic drug monitoring  Z51.81   3. Drug-induced weight loss  R63.4    T50.905A     Pulmonary fibrosis overall progressive between 2019 and 2021 but is stable in the last 1 year between October 2020 and November/December 2021 suggesting the antifibrotic's in the last 1 year have helped you  However you're having significant side effects of weight loss and other GI side effects with both antifibrotic's  It appears that pirfenidone had more side effects than nintedanib  Plan [based on shared decision making an extensive discussion] -Restart nintedanib [respect the fact that he want to give this a try once again] -take 150 mg once daily through 02/24/2020 and then starting 02/25/2020 go up to 150 mg twice daily  -We will do a new prescription start -List pirfenidone as an allergy [weight loss and GI side effects] -Remove nintedanib from allergy list -Refer for clinical trials [we discussed extensively and will put you on consideration waiting list for IV infusion phase 3 trial)  - can take a few months to get in    Follow-up -4-6  weeks do liver function test -4-6 weeks return to see nurse practitioner to monitor your weight and any side effects from nintedanib -12 weeks to spirometry and DLCO -12 weeks return to see Dr. Chase Caller in 30-minute slot but after spirometry and DLCO

## 2020-02-28 ENCOUNTER — Telehealth: Payer: Self-pay | Admitting: Internal Medicine

## 2020-02-28 MED ORDER — OFEV 150 MG PO CAPS: 150.0000 mg | ORAL_CAPSULE | Freq: Two times a day (BID) | ORAL | 3 refills | Status: DC

## 2020-02-28 NOTE — Telephone Encounter (Signed)
Pt requesting Ofev refill.  This has been sent to preferred pharmacy.  Nothing further needed at this time- will close encounter.

## 2020-03-05 ENCOUNTER — Encounter: Payer: Self-pay | Admitting: Internal Medicine

## 2020-03-05 ENCOUNTER — Ambulatory Visit: Payer: PPO | Admitting: Internal Medicine

## 2020-03-05 VITALS — BP 100/70 | HR 71 | Ht 68.0 in | Wt 178.8 lb

## 2020-03-05 DIAGNOSIS — K5900 Constipation, unspecified: Secondary | ICD-10-CM | POA: Diagnosis not present

## 2020-03-05 DIAGNOSIS — K219 Gastro-esophageal reflux disease without esophagitis: Secondary | ICD-10-CM

## 2020-03-05 DIAGNOSIS — R110 Nausea: Secondary | ICD-10-CM

## 2020-03-05 NOTE — Patient Instructions (Signed)
Take 2 tablespoons of Metamucil daily in 12 -14 ounces of water or juice daily.  Please follow up as needed

## 2020-03-07 ENCOUNTER — Encounter: Payer: Self-pay | Admitting: Internal Medicine

## 2020-03-07 NOTE — Progress Notes (Signed)
HISTORY OF PRESENT ILLNESS:  Eduardo Fuentes is a 70 y.o. male who presents today for follow-up.  Last evaluated December 2020 regarding nausea, loose stools, weight loss, GERD, and diverticulosis.  See that dictation.  He presents today complaining of alternating bowel habits.  Also ongoing chronic nausea which she feels is secondary to Ofev.  He has reflux symptoms seem to be controlled with pantoprazole.  No dysphagia.  No lower GI complaints.  He has completed his COVID vaccination series  REVIEW OF SYSTEMS:  All non-GI ROS negative cough and shortness of breath  Past Medical History:  Diagnosis Date  . Agatston CAC score, <100 04/2018  . Arthritis    NECK  . Cough    NON-PRODUCTIVE  . Depressive disorder 10/2011   follow by dr [facility] psychiatry; has anxiety and panic disorder as well.  . Diverticulosis   . Diverticulosis of colon    Followed by Dr. Henrene Pastor  . Essential hypertension   . Generalized anxiety disorder   . GERD (gastroesophageal reflux disease)    WATCHES DIET  . Hemorrhoid   . Hiatal hernia    Describes a sliding hiatal hernia  . History of colon polyps   . History of kidney stones   . 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 Microscopic hematuria   . Nocturia   . Panic attacks   . Prostate cancer ([place]) 07/2012   Dr. Arnell Sieving urology.  . Sinus infection   . Tinnitus    LEFT EAR -- CHRONIC    Past Surgical History:  Procedure Laterality Date  . COLONOSCOPY  03/04/2013  . ESOPHAGOGASTRODUODENOSCOPY    . EXTRACORPOREAL SHOCK WAVE LITHOTRIPSY  YRS AGO  . PROSTATE BIOPSY    . RADIOACTIVE SEED IMPLANT N/A 02/02/2013   Procedure: RADIOACTIVE SEED IMPLANT;  Surgeon: Molli Hazard, MD;  Location: [facility];  Service: Urology;  Laterality: N/A;  . TONSILLECTOMY AND ADENOIDECTOMY  AS CHILD  . UPPER GASTROINTESTINAL ENDOSCOPY  2019    Social History SAGE KOPERA  reports that he has never smoked. He has never used smokeless  tobacco. He reports that he does not drink alcohol and does not use drugs.  family history includes Alzheimer's disease in his mother; Anxiety disorder in his mother and sister; Bone cancer in his father; Cancer in his father; Dementia in his mother; Heart attack (age of onset: 35) in his father.  Allergies  Allergen Reactions  . Aleve [Naproxen Sodium] Anaphylaxis and Other (See Comments)    GI UPSET Patient does not remember anaphylaxis  . Hyzaar [Losartan Potassium-Hctz] Other (See Comments)    Felt bad   . Lisinopril Cough  . Demerol [Meperidine] Rash            PHYSICAL EXAMINATION: Vital signs: BP 100/70   Pulse 71   Ht 5\' 8"  (1.727 m)   Wt 178 lb 12.8 oz (81.1 kg)   SpO2 96%   BMI 27.19 kg/m   Constitutional: generally well-appearing, no acute distress Psychiatric: alert and oriented x3, cooperative Eyes: extraocular movements intact, anicteric, conjunctiva pink Mouth: oral pharynx moist, no lesions Neck: supple no lymphadenopathy Cardiovascular: heart regular rate and rhythm, no murmur Lungs: clear to auscultation bilaterally Abdomen: soft, nontender, nondistended, no obvious ascites, no peritoneal signs, normal bowel sounds, no organomegaly Rectal: Omitted Extremities: no clubbing, cyanosis, or lower extremity edema bilaterally Skin: no lesions on visible extremities Neuro: No focal deficits. No asterixis.    ASSESSMENT:  1.  Alternating bowel habits  with a tendency toward constipation. 2.  Chronic stable nausea 3.  GERD.  Controlled symptoms with PPI 4.  History of peptic stricture with prior dilation.  No recurrent dysphagia.  Last EGD with dilation in 2019 5.  Colonoscopy 2015 with diverticulosis  PLAN:  1.  Metamucil 2 tablespoons daily for irregular bowel habits 2.  Reflux precautions 3.  Continue PPI.  Pantoprazole refilled 4.  Zofran as needed for nausea.  Refilled Zofran 5.  Consider repeat screening colonoscopy 2025 6.  Routine office  follow-up 1 year

## 2020-03-08 ENCOUNTER — Ambulatory Visit: Payer: PPO | Admitting: Pulmonary Disease

## 2020-03-08 ENCOUNTER — Ambulatory Visit (INDEPENDENT_AMBULATORY_CARE_PROVIDER_SITE_OTHER): Payer: PPO | Admitting: Pulmonary Disease

## 2020-03-08 ENCOUNTER — Other Ambulatory Visit: Payer: Self-pay

## 2020-03-08 ENCOUNTER — Encounter: Payer: Self-pay | Admitting: Pulmonary Disease

## 2020-03-08 VITALS — BP 118/80 | HR 90 | Temp 97.6°F | Ht 69.0 in | Wt 176.2 lb

## 2020-03-08 DIAGNOSIS — Z5181 Encounter for therapeutic drug level monitoring: Secondary | ICD-10-CM

## 2020-03-08 DIAGNOSIS — J84112 Idiopathic pulmonary fibrosis: Secondary | ICD-10-CM | POA: Diagnosis not present

## 2020-03-08 LAB — HEPATIC FUNCTION PANEL
ALT: 20 U/L (ref 0–53)
AST: 28 U/L (ref 0–37)
Albumin: 4.2 g/dL (ref 3.5–5.2)
Alkaline Phosphatase: 110 U/L (ref 39–117)
Bilirubin, Direct: 0.1 mg/dL (ref 0.0–0.3)
Total Bilirubin: 0.5 mg/dL (ref 0.2–1.2)
Total Protein: 7.4 g/dL (ref 6.0–8.3)

## 2020-03-08 NOTE — Patient Instructions (Addendum)
You were seen today by Lauraine Rinne, NP  for:   1. IPF (idiopathic pulmonary fibrosis) ([place])  Walk today in office  Lab work today  Continue Ofev  As discussed today if it is a day where you are noticing you are not as hungry please try to supplement in between meals and high-protein boost or Ensure, do not use this as a meal replacement  Continue to remain physically active  Spirometry with DLCO breathing test to be completed in May or June 2022  2. Therapeutic drug monitoring  Lab work today  Follow Up:    Return in about 4 months (around 07/06/2020), or if symptoms worsen or fail to improve, for Follow up with Dr. Purnell Shoemaker, Follow up for PFT - 19min, [place] with DLCO, ILD clinic - 72min slot.   Notification of test results are managed in the following manner: If there are  any recommendations or changes to the  plan of care discussed in office today,  we will contact you and let you know what they are. If you do not hear from [place], then your results are normal and you can view them through your  MyChart account , or a letter will be sent to you. Thank you again for trusting [place] with your care  - Thank you, [facility]    It is flu season:   >>> Best ways to protect herself from the flu: Receive the yearly flu vaccine, practice good hand hygiene washing with soap and also using hand sanitizer when available, eat a nutritious meals, get adequate rest, hydrate appropriately       Please contact the office if your symptoms worsen or you have concerns that you are not improving.   Thank you for choosing [facility] for your healthcare, and for allowing [place] to partner with you on your healthcare journey. I am thankful to be able to provide care to you today.   Wyn Quaker FNP-C

## 2020-03-08 NOTE — Progress Notes (Signed)
@Patient  ID: Eduardo Fuentes, male    DOB: 17-Apr-1950, 70 y.o.   MRN: [id number]  Chief Complaint  Patient presents with  . Follow-up    IPF, doing well, occ dry cough    Referring provider: Lujean Amel, MD  HPI:  70 year old male patient followed in our office for interstitial lung disease and IPF [clinical diagnosis based on classic UIP scan negative serology and age and male gender; diagnosis given February 11, 2017 and on Ofev since 02/27/2018  PMH: GERD Smoker/ Smoking History: Never Smoker  Maintenance: OFEV Pt of: Dr. Chase Caller  03/08/2020  - Visit   70 year old male never smoker fonder office for IPF.  Established with Dr. Chase Caller.  Last seen in December/2021 plan of care from that office visit was as follows: Restart Ofev, take once daily through 02/24/2020, then resume 150 mg twice daily, will list esbriet As allergy due to weight loss and GI side effects.  Referred to clinical trials for waitlist for IV infusion for phase 3 trial.  Patient is doing well since last being seen.  He is at full dosing of Ofev.  He has some occasional nausea.  He has not yet been contacted by the clinical research team.  We will discuss this today.  He is up-to-date with COVID-19 vaccinations.  Walk today in office stable without any exertional hypoxemia.  Patient tolerated well on room air.  Last high-resolution CT chest imaging in December showed stability of patient's ILD when on antifibrotic's.  Questionaires / Pulmonary Flowsheets:   ACT:  No flowsheet data found.  MMRC: mMRC Dyspnea Scale mMRC Score  01/17/2019 0    Epworth:  No flowsheet data found.  Tests:   FENO:  No results found for: NITRICOXIDE  PFT: PFT Results Latest Ref Rng & Units 11/01/2019 07/18/2019 01/17/2019 08/25/2018 02/11/2018  FVC-Pre L 2.75 2.65 2.82 2.69 2.90  FVC-Predicted Pre % 63 61 65 62 67  FVC-Post L - - - - 2.52  FVC-Predicted Post % - - - - 58  Pre FEV1/FVC % % 79 83 85 73 86  Post  FEV1/FCV % % - - - - 70  FEV1-Pre L 2.18 2.20 2.40 1.95 2.50  FEV1-Predicted Pre % 68 69 75 61 78  FEV1-Post L - - - - 1.77  DLCO uncorrected ml/min/mmHg 16.68 12.67 14.84 14.69 -  DLCO UNC% % 65 50 58 57 -  DLCO corrected ml/min/mmHg 16.68 12.67 - - -  DLCO COR %Predicted % 65 50 - - -  DLVA Predicted % 109 83 88 97 -  TLC L - - - - 3.41  TLC % Predicted % - - - - 50  RV % Predicted % - - - - 11    WALK:  SIX MIN WALK 03/08/2020 11/01/2019 08/31/2019 07/20/2019 01/17/2019 10/28/2018 08/30/2018  Supplimental Oxygen during Test? (L/min) No No No - No - No  2 Minute Oxygen Saturation % - - - - - 91 -  2 Minute HR - - - - - 102 -  4 Minute Oxygen Saturation % - - - - - 91 -  4 Minute HR - - - - - 102 -  6 Minute Oxygen Saturation % - - - - - 90 -  6 Minute HR - - - - - 98 -  Tech Comments: - Walked at a fast pace, no stops for rest.  No c/o sob.  tolerated walk well. patient completed walk talking the whole time, no desats, patient tolerated well  no complaints pt walked at even pace//sb Patient was able to complete 2 laps without stopping. Denied any SOB, CP or leg pain. No O2 needed during or after walk. - patient started with O2 sat of 97 on room air, and heart rate of 102 tolerated walk well, ending o2 sat at 96 with a heart rate of 100.    Imaging: No results found.  Lab Results:  CBC    Component Value Date/Time   WBC 8.0 01/15/2018 1323   RBC 5.00 01/15/2018 1323   HGB 15.2 01/15/2018 1323   HCT 46.1 01/15/2018 1323   PLT 306 01/15/2018 1323   MCV 92.2 01/15/2018 1323   MCH 30.4 01/15/2018 1323   MCHC 33.0 01/15/2018 1323   RDW 14.0 01/15/2018 1323   LYMPHSABS 0.9 01/26/2013 1102   MONOABS 0.2 01/26/2013 1102   EOSABS 0.1 01/26/2013 1102   BASOSABS 0.0 01/26/2013 1102    BMET    Component Value Date/Time   NA 138 08/31/2019 1204   K 4.1 08/31/2019 1204   CL 99 08/31/2019 1204   CO2 33 (H) 08/31/2019 1204   GLUCOSE 81 08/31/2019 1204   BUN 23 08/31/2019 1204    CREATININE 1.42 08/31/2019 1204   CALCIUM 9.6 08/31/2019 1204   GFRNONAA >60 01/15/2018 1323   GFRAA >60 01/15/2018 1323    BNP No results found for: BNP  ProBNP No results found for: PROBNP  Specialty Problems      Pulmonary Problems   IPF (idiopathic pulmonary fibrosis) (HCC)    01/20/2018-CT chest high-res-spectrum of findings compatible with basilar predominant fibrotic interstitial lung disease with mild honeycombing these findings are new compared to November 2014 chest radiograph.  Findings are consistent with UIP.   01/29/2018- hypersensitivity pneumonitis, CK, aldolase, CCP, Rh, ANA, ANA titer-trace positive ANA but otherwise autoimmune labs normal  01/29/2018- integrative comprehensive ILD questionnaire [facility] Integrated Comprehensive ILD Questionnaire  Symptoms: He reports shortness of breath onset gradually for the last 1 year.  It is getting worse with time.  He does have episodic dyspnea.  When he does shopping it is level 1.  When he walks on a level ground with others of his age at this level 1 walking up stairs at this level 1 but walking at his own pace at this level 2 and taking a shower at level 3.  He does have associated arthralgia and cough.  He is not sure when the cough started.  It is present at all positions since it started it is the same as moderate in intensity.  He does cough at night.  He does bring up phlegm which is clear in color.  It gets worse when he lies down and it affects his voice and he does clear his throat.  He does not feel a tickle.  There is no wheezing.   Past Medical History : This is positive for major depression but he circled no for asthma, COPD.  Surgical no for heart failure.  Surgical no for collagen vascular disease with sleep apnea.  He did not answer for acid reflux which he does have..  Denies diabetes or thyroid disease or heart disease or pulmonary embolism or pneumonia   ROS:  .  Positive for difficulty swallowing due to  his esophageal stricture history.  Does have nausea and acid reflux and snoring.  Otherwise no vomiting is positive for soreness across the body after electroconvulsive therapy 2 days ago.   FAMILY HISTORY of LUNG DISEASE: Denies family history  of any pulmonary fibrosis COPD or asthma sarcoidosis   EXPOSURE HISTORY: Denies smoking cigarettes or tobacco or marijuana or cocaine.  No intravenous drug abuse.   HOME and HOBBY DETAILS : He did not list the age of the home but is lived in this home for 25 [place].  There is no mold in the house of mildew.  The house is not damp.  Does not use a CPAP mask.  No nebulizer machine.  No use of Jacuzzi.  Nose fountain in the house.  No pet birds or gerbils.  Does not use feather pillows.  He is not sure if there is mold in the [facility] duct system.  Does not play wind instruments.  He occasionally mows the lawn   OCCUPATIONAL HISTORY (122 questions) : Positive for social gardening.  Did report that he is used feather pillows and blankets in the past but otherwise negative including working in a dusty environment   PULMONARY TOXICITY HISTORY (27 items): Positive for cancer chemotherapy but he does not know the details  02/11/2018- pulmonary function test.  FVC 2.9 (67% predicted), postbronchodilator ratio 70, FEV1 55, patient had difficulty completing pulmonary function test due to repeated coughing  08/25/2018-pulmonary function test-FVC 2.69 (62% predicted), ratio 73, FEV1 1.95 (61% predicted), DLCO 57          Allergies  Allergen Reactions  . Aleve [Naproxen Sodium] Anaphylaxis and Other (See Comments)    GI UPSET Patient does not remember anaphylaxis  . Hyzaar [Losartan Potassium-Hctz] Other (See Comments)    Felt bad   . Lisinopril Cough  . Demerol [Meperidine] Rash         Immunization History  Administered Date(s) Administered  . Fluad Quad(high Dose 65+) 12/30/2018  . Influenza Inj Mdck Quad Pf 11/09/2017  .  Influenza Split 12/05/2005, 11/27/2010, 12/19/2011, 11/26/2012  . Influenza,inj,Quad PF,6+ Mos 10/29/2016, 11/09/2017, 11/18/2018, 11/18/2019  . Influenza,inj,quad, With Preservative 10/25/2013, 11/27/2014, 10/26/2015  . PFIZER SARS-COV-2 Vaccination 04/02/2019, 04/27/2019, 12/10/2019  . Pneumococcal Conjugate-13 10/26/2015  . Pneumococcal Polysaccharide-23 10/29/2016  . Td 02/24/1998  . Tdap 07/15/2011  . Zoster 03/19/2012  . Zoster Recombinat (Shingrix) 12/03/2018, 02/01/2019    Past Medical History:  Diagnosis Date  . Agatston CAC score, <100 04/2018  . Arthritis    NECK  . Cough    NON-PRODUCTIVE  . Depressive disorder 10/2011   follow by dr [facility] psychiatry; has anxiety and panic disorder as well.  . Diverticulosis   . Diverticulosis of colon    Followed by Dr. Henrene Pastor  . Essential hypertension   . Generalized anxiety disorder   . GERD (gastroesophageal reflux disease)    WATCHES DIET  . Hemorrhoid   . Hiatal hernia    Describes a sliding hiatal hernia  . History of colon polyps   . History of kidney stones   . Idiopathic pulmonary fibrosis ([facility])   . Microscopic hematuria   . Nocturia   . Panic attacks   . Prostate cancer ([place]) 07/2012   Dr. Arnell Sieving urology.  . Sinus infection   . Tinnitus    LEFT EAR -- CHRONIC    Tobacco History: Social History   Tobacco Use  Smoking Status Never Smoker  Smokeless Tobacco Never Used   Counseling given: Not Answered   Continue to not smoke  Outpatient Encounter Medications as of 03/08/2020  Medication Sig  . clonazePAM (KLONOPIN) 1 MG tablet Take 1 tablet at 8 am, 1/2 tablet at 2 pm and 1 tablet  at bedtime for anxiety. (Patient taking differently: Take 1 mg by mouth daily.)  . gabapentin (NEURONTIN) 300 MG capsule Take 600 mg by mouth 2 (two) times daily.   . hydrochlorothiazide (HYDRODIURIL) 12.5 MG tablet Take 12.5 mg by mouth daily.  . mirtazapine (REMERON) 30 MG tablet Take 30 mg by mouth at bedtime.   .  Nintedanib (OFEV) 150 MG CAPS Take 1 capsule (150 mg total) by mouth 2 (two) times daily.  . nortriptyline (PAMELOR) 50 MG capsule Take 50 mg by mouth at bedtime.   . ondansetron (ZOFRAN) 4 MG tablet Take 4 mg by mouth as needed.  . pantoprazole (PROTONIX) 40 MG tablet TAKE 1 TABLET(40 MG) BY MOUTH DAILY  . Polyethylene Glycol 3350 (MIRALAX PO) Take 1 Dose by mouth daily.   Facility-Administered Encounter Medications as of 03/08/2020  Medication  . 0.9 %  sodium chloride infusion     Review of Systems  Review of Systems  Constitutional: Positive for fatigue. Negative for activity change, chills, fever and unexpected weight change.  HENT: Negative for postnasal drip, rhinorrhea, sinus pressure, sinus pain and sore throat.   Eyes: Negative.   Respiratory: Positive for cough. Negative for shortness of breath and wheezing.   Cardiovascular: Negative for chest pain and palpitations.  Gastrointestinal: Negative for constipation, diarrhea, nausea and vomiting.  Endocrine: Negative.   Genitourinary: Negative.   Musculoskeletal: Negative.   Skin: Negative.   Neurological: Negative for dizziness and headaches.  Psychiatric/Behavioral: Negative.  Negative for dysphoric mood. The patient is not nervous/anxious.   All other systems reviewed and are negative.    Physical Exam  BP 118/80 (BP Location: Left Arm, Cuff Size: Normal)   Pulse 90   Temp 97.6 F (36.4 C) (Oral)   Ht 5\' 9"  (1.753 m)   Wt 176 lb 3.2 oz (79.9 kg)   SpO2 98%   BMI 26.02 kg/m   Wt Readings from Last 5 Encounters:  03/08/20 176 lb 3.2 oz (79.9 kg)  03/05/20 178 lb 12.8 oz (81.1 kg)  02/09/20 189 lb 6.4 oz (85.9 kg)  11/01/19 182 lb (82.6 kg)  08/31/19 184 lb 6.4 oz (83.6 kg)   Pt feels 02/09/20 weight is inaccurate. Feels it may be 179lbs.   BMI Readings from Last 5 Encounters:  03/08/20 26.02 kg/m  03/05/20 27.19 kg/m  02/09/20 28.80 kg/m  11/01/19 26.88 kg/m  08/31/19 27.23 kg/m     Physical  Exam Vitals and nursing note reviewed.  Constitutional:      General: He is not in acute distress.    Appearance: Normal appearance. He is normal weight.  HENT:     Head: Normocephalic and atraumatic.     Right Ear: Hearing and external ear normal.     Left Ear: Hearing and external ear normal.     Nose: Nose normal. No mucosal edema or rhinorrhea.     Right Turbinates: Not enlarged.     Left Turbinates: Not enlarged.     Mouth/Throat:     Mouth: Mucous membranes are dry.     Pharynx: Oropharynx is clear. No oropharyngeal exudate.  Eyes:     Pupils: Pupils are equal, round, and reactive to light.  Cardiovascular:     Rate and Rhythm: Normal rate and regular rhythm.     Pulses: Normal pulses.     Heart sounds: Normal heart sounds. No murmur heard.   Pulmonary:     Effort: Pulmonary effort is normal.     Breath sounds: Rales (Bibasilar  crackles) present. No decreased breath sounds or wheezing.  Musculoskeletal:     Cervical back: Normal range of motion.     Right lower leg: No edema.     Left lower leg: No edema.  Lymphadenopathy:     Cervical: No cervical adenopathy.  Skin:    General: Skin is warm and dry.     Capillary Refill: Capillary refill takes less than 2 seconds.     Findings: No erythema or rash.  Neurological:     General: No focal deficit present.     Mental Status: He is alert and oriented to person, place, and time.     Motor: No weakness.     Coordination: Coordination normal.     Gait: Gait is intact. Gait normal.  Psychiatric:        Mood and Affect: Mood normal.        Behavior: Behavior normal. Behavior is cooperative.        Thought Content: Thought content normal.        Judgment: Judgment normal.       Assessment & Plan:   IPF (idiopathic pulmonary fibrosis) ([place]) Plan: Lab work today Walk today in office We will schedule spirometry with DLCO in May or June 2022 Follow-up with Dr. Chase Caller in May or June/2022 with PFT Continue Ofev We  will send message to [facility] team regarding clinical trial referral  Therapeutic drug monitoring Plan: Hepatic function panel today    Return in about 4 months (around 07/06/2020), or if symptoms worsen or fail to improve, for Follow up with Dr. Purnell Shoemaker, Follow up for PFT - 16min, Spiro with DLCO, ILD clinic - 52min slot.   Lauraine Rinne, NP 03/08/2020   This appointment required 32 minutes of patient care (this includes precharting, chart review, review of results, face-to-face care, etc.).

## 2020-03-08 NOTE — Assessment & Plan Note (Signed)
Plan: Hepatic function panel today

## 2020-03-08 NOTE — Assessment & Plan Note (Signed)
Plan: Lab work today Walk today in office We will schedule spirometry with DLCO in May or June 2022 Follow-up with Dr. Chase Caller in May or June/2022 with PFT Continue Ofev We will send message to [facility] team regarding clinical trial referral

## 2020-03-09 ENCOUNTER — Other Ambulatory Visit: Payer: Self-pay | Admitting: Internal Medicine

## 2020-03-09 NOTE — Progress Notes (Signed)
Lab             03/08/20                      1202         AST          28           ALT          20           ALKPHOS      110          BILITOT      0.5          PROT         7.4          ALBUMIN      4.2           LFT normal. Will not call with result

## 2020-03-21 DIAGNOSIS — F3342 Major depressive disorder, recurrent, in full remission: Secondary | ICD-10-CM | POA: Diagnosis not present

## 2020-03-21 DIAGNOSIS — F410 Panic disorder [episodic paroxysmal anxiety] without agoraphobia: Secondary | ICD-10-CM | POA: Diagnosis not present

## 2020-04-25 ENCOUNTER — Other Ambulatory Visit: Payer: Self-pay

## 2020-04-25 ENCOUNTER — Ambulatory Visit (INDEPENDENT_AMBULATORY_CARE_PROVIDER_SITE_OTHER): Payer: PPO

## 2020-04-25 ENCOUNTER — Ambulatory Visit: Payer: PPO | Admitting: Podiatry

## 2020-04-25 DIAGNOSIS — M109 Gout, unspecified: Secondary | ICD-10-CM | POA: Diagnosis not present

## 2020-04-25 DIAGNOSIS — M779 Enthesopathy, unspecified: Secondary | ICD-10-CM | POA: Diagnosis not present

## 2020-04-25 DIAGNOSIS — M79671 Pain in right foot: Secondary | ICD-10-CM

## 2020-04-25 MED ORDER — TRIAMCINOLONE ACETONIDE 10 MG/ML IJ SUSP
10.0000 mg | Freq: Once | INTRAMUSCULAR | Status: AC
  Administered 2020-04-25: 10 mg

## 2020-04-25 NOTE — Patient Instructions (Signed)
Gout  Gout is painful swelling of your joints. Gout is a type of arthritis. It is caused by having too much uric acid in your body. Uric acid is a chemical that is made when your body breaks down substances called purines. If your body has too much uric acid, sharp crystals can form and build up in your joints. This causes pain and swelling. Gout attacks can happen quickly and be very painful (acute gout). Over time, the attacks can affect more joints and happen more often (chronic gout). What are the causes?  Too much uric acid in your blood. This can happen because: ? Your kidneys do not remove enough uric acid from your blood. ? Your body makes too much uric acid. ? You eat too many foods that are high in purines. These foods include organ meats, some seafood, and beer.  Trauma or stress. What increases the risk?  Having a family history of gout.  Being male and middle-aged.  Being male and having gone through menopause.  Being very overweight (obese).  Drinking alcohol, especially beer.  Not having enough water in the body (being dehydrated).  Losing weight too quickly.  Having an organ transplant.  Having lead poisoning.  Taking certain medicines.  Having kidney disease.  Having a skin condition called psoriasis. What are the signs or symptoms? An attack of acute gout usually happens in just one joint. The most common place is the big toe. Attacks often start at night. Other joints that may be affected include joints of the feet, ankle, knee, fingers, wrist, or elbow. Symptoms of an attack may include:  Very bad pain.  Warmth.  Swelling.  Stiffness.  Shiny, red, or purple skin.  Tenderness. The affected joint may be very painful to touch.  Chills and fever. Chronic gout may cause symptoms more often. More joints may be involved. You may also have white or yellow lumps (tophi) on your hands or feet or in other areas near your joints.   How is this  treated?  Treatment for this condition has two phases: treating an acute attack and preventing future attacks.  Acute gout treatment may include: ? NSAIDs. ? Steroids. These are taken by mouth or injected into a joint. ? Colchicine. This medicine relieves pain and swelling. It can be given by mouth or through an IV tube.  Preventive treatment may include: ? Taking small doses of NSAIDs or colchicine daily. ? Using a medicine that reduces uric acid levels in your blood. ? Making changes to your diet. You may need to see a food expert (dietitian) about what to eat and drink to prevent gout. Follow these instructions at home: During a gout attack  If told, put ice on the painful area: ? Put ice in a plastic bag. ? Place a towel between your skin and the bag. ? Leave the ice on for 20 minutes, 2-3 times a day.  Raise (elevate) the painful joint above the level of your heart as often as you can.  Rest the joint as much as possible. If the joint is in your leg, you may be given crutches.  Follow instructions from your doctor about what you cannot eat or drink.   Avoiding future gout attacks  Eat a low-purine diet. Avoid foods and drinks such as: ? Liver. ? Kidney. ? Anchovies. ? Asparagus. ? Herring. ? Mushrooms. ? Mussels. ? Beer.  Stay at a healthy weight. If you want to lose weight, talk with your doctor. Do   not lose weight too fast.  Start or continue an exercise plan as told by your doctor. Eating and drinking  Drink enough fluids to keep your pee (urine) pale yellow.  If you drink alcohol: ? Limit how much you use to:  0-1 drink a day for women.  0-2 drinks a day for men. ? Be aware of how much alcohol is in your drink. In the [place] one drink equals one 12 oz bottle of beer (355 mL), one 5 oz glass of wine (148 mL), or one 1 oz glass of hard liquor (44 mL). General instructions  Take over-the-counter and prescription medicines only as told by your doctor.  Do  not drive or use heavy machinery while taking prescription pain medicine.  Return to your normal activities as told by your doctor. Ask your doctor what activities are safe for you.  Keep all follow-up visits as told by your doctor. This is important. Contact a doctor if:  You have another gout attack.  You still have symptoms of a gout attack after 10 days of treatment.  You have problems (side effects) because of your medicines.  You have chills or a fever.  You have burning pain when you pee (urinate).  You have pain in your lower back or belly. Get help right away if:  You have very bad pain.  Your pain cannot be controlled.  You cannot pee. Summary  Gout is painful swelling of the joints.  The most common site of pain is the big toe, but it can affect other joints.  Medicines and avoiding some foods can help to prevent and treat gout attacks. This information is not intended to replace advice given to you by your health care provider. Make sure you discuss any questions you have with your health care provider. Document Revised: 09/02/2017 Document Reviewed: 09/02/2017 Elsevier Patient Education  2021 Elsevier Inc.  

## 2020-04-26 NOTE — Progress Notes (Signed)
Subjective:   Patient ID: Eduardo Fuentes, male   DOB: 70 y.o.   MRN: [id number]   HPI Patient presents stating he has developed redness and swelling around his first metatarsal joint right and its been present for 5 days and does not remember specific initial injury.  Patient does have family history with father having had gout in the past   ROS      Objective:  Physical Exam  Neurovascular status intact with inflammation pain around the first MPJ right with fluid buildup that also seems to involve the plantar sesamoidal complex with no other pathology noted     Assessment:  Inflammatory capsulitis versus sesamoiditis right with possibility for overlying gout     Plan:  H&P x-ray reviewed condition discussed.  At this point I went ahead and I did do sterile prep I injected the plantar capsule medial capsule 3 mg Dexasone Kenalog 5 mg Xylocaine and I then went ahead and educated him on gout and causes and gave him sheets the talk about it.  Patient wants to watch his diet we will see the response and decide what else may be appropriate for this patient  X-rays indicate no indications of bony encroachment appears to be soft tissue and no osteolysis noted

## 2020-04-30 ENCOUNTER — Other Ambulatory Visit: Payer: Self-pay | Admitting: Internal Medicine

## 2020-05-03 ENCOUNTER — Emergency Department (HOSPITAL_COMMUNITY): Payer: PPO

## 2020-05-03 ENCOUNTER — Encounter (HOSPITAL_COMMUNITY): Payer: Self-pay

## 2020-05-03 ENCOUNTER — Other Ambulatory Visit: Payer: Self-pay

## 2020-05-03 ENCOUNTER — Emergency Department (HOSPITAL_COMMUNITY)
Admission: EM | Admit: 2020-05-03 | Discharge: 2020-05-03 | Disposition: A | Discharge status: Home / Self Care | Payer: PPO | Attending: Emergency Medicine | Admitting: Emergency Medicine

## 2020-05-03 DIAGNOSIS — I959 Hypotension, unspecified: Secondary | ICD-10-CM | POA: Diagnosis not present

## 2020-05-03 DIAGNOSIS — Z8546 Personal history of malignant neoplasm of prostate: Secondary | ICD-10-CM | POA: Insufficient documentation

## 2020-05-03 DIAGNOSIS — I10 Essential (primary) hypertension: Secondary | ICD-10-CM | POA: Insufficient documentation

## 2020-05-03 DIAGNOSIS — Z79899 Other long term (current) drug therapy: Secondary | ICD-10-CM | POA: Insufficient documentation

## 2020-05-03 DIAGNOSIS — I4891 Unspecified atrial fibrillation: Secondary | ICD-10-CM | POA: Diagnosis not present

## 2020-05-03 DIAGNOSIS — R42 Dizziness and giddiness: Secondary | ICD-10-CM | POA: Diagnosis not present

## 2020-05-03 LAB — URINALYSIS, ROUTINE W REFLEX MICROSCOPIC
Bilirubin Urine: NEGATIVE
Glucose, UA: NEGATIVE mg/dL
Hgb urine dipstick: NEGATIVE
Ketones, ur: NEGATIVE mg/dL
Leukocytes,Ua: NEGATIVE
Nitrite: NEGATIVE
Protein, ur: NEGATIVE mg/dL
Specific Gravity, Urine: 1.012 (ref 1.005–1.030)
pH: 5 (ref 5.0–8.0)

## 2020-05-03 LAB — CBC
HCT: 47.6 % (ref 39.0–52.0)
Hemoglobin: 16.3 g/dL (ref 13.0–17.0)
MCH: 32.3 pg (ref 26.0–34.0)
MCHC: 34.2 g/dL (ref 30.0–36.0)
MCV: 94.3 fL (ref 80.0–100.0)
Platelets: 333 10*3/uL (ref 150–400)
RBC: 5.05 MIL/uL (ref 4.22–5.81)
RDW: 13.2 % (ref 11.5–15.5)
WBC: 9.6 10*3/uL (ref 4.0–10.5)
nRBC: 0 % (ref 0.0–0.2)

## 2020-05-03 LAB — BASIC METABOLIC PANEL
Anion gap: 10 (ref 5–15)
BUN: 22 mg/dL (ref 8–23)
CO2: 27 mmol/L (ref 22–32)
Calcium: 9.1 mg/dL (ref 8.9–10.3)
Chloride: 100 mmol/L (ref 98–111)
Creatinine, Ser: 1.5 mg/dL — ABNORMAL HIGH (ref 0.61–1.24)
GFR, Estimated: 50 mL/min — ABNORMAL LOW (ref >60)
Glucose, Bld: 91 mg/dL (ref 70–99)
Potassium: 4.2 mmol/L (ref 3.5–5.1)
Sodium: 137 mmol/L (ref 135–145)

## 2020-05-03 LAB — CBG MONITORING, ED: Glucose-Capillary: 98 mg/dL (ref 70–99)

## 2020-05-03 LAB — TROPONIN I (HIGH SENSITIVITY)
Troponin I (High Sensitivity): 8 ng/L (ref <18)
Troponin I (High Sensitivity): 8 ng/L (ref <18)

## 2020-05-03 LAB — D-DIMER, QUANTITATIVE: D-Dimer, Quant: 0.65 ug/mL-FEU — ABNORMAL HIGH (ref 0.00–0.50)

## 2020-05-03 MED ORDER — SODIUM CHLORIDE 0.9 % IV BOLUS
1000.0000 mL | Freq: Once | INTRAVENOUS | Status: AC
  Administered 2020-05-03: 1000 mL via INTRAVENOUS

## 2020-05-03 MED ORDER — APIXABAN 5 MG PO TABS: 5.0000 mg | ORAL_TABLET | Freq: Two times a day (BID) | ORAL | 0 refills | Status: DC

## 2020-05-03 MED ORDER — APIXABAN 5 MG PO TABS
5.0000 mg | ORAL_TABLET | Freq: Two times a day (BID) | ORAL | Status: DC
  Administered 2020-05-03: 5 mg via ORAL
  Filled 2020-05-03: qty 1

## 2020-05-03 MED ORDER — DILTIAZEM HCL 25 MG/5ML IV SOLN
20.0000 mg | Freq: Once | INTRAVENOUS | Status: AC
  Administered 2020-05-03: 20 mg via INTRAVENOUS
  Filled 2020-05-03: qty 5

## 2020-05-03 MED ORDER — DILTIAZEM HCL ER COATED BEADS 120 MG PO CP24
120.0000 mg | ORAL_CAPSULE | Freq: Once | ORAL | Status: AC
  Administered 2020-05-03: 120 mg via ORAL
  Filled 2020-05-03: qty 1

## 2020-05-03 MED ORDER — DILTIAZEM HCL ER COATED BEADS 120 MG PO CP24: 120.0000 mg | ORAL_CAPSULE | Freq: Every day | ORAL | 0 refills | Status: DC

## 2020-05-03 NOTE — Discharge Instructions (Signed)
Please follow-up with your primary doctor and cardiology.  Ideally, you should see your primary doctor tomorrow or on Monday for close recheck of your blood pressure and heart rate.  For now, I would recommend stopping your hydrochlorothiazide.  Please start taking the Cardizem and Eliquis.  Please review the attachment regarding bleeding precautions now that you have started a blood thinner.  If you have any episodes of lightheadedness, dizziness, passing out, chest pain or other new concerning symptom, please return to ER for reassessment

## 2020-05-03 NOTE — ED Provider Notes (Signed)
[facility] DEPT Provider Note   CSN: [id number] Arrival date & time: 05/03/20  1803     History Chief Complaint  Patient presents with  . Dizziness    Eduardo Fuentes is a 70 y.o. male.  Presented to the ER with concern for A. fib, dizziness.  Patient reports the past couple days he has intermittently felt somewhat lightheaded, slightly dizzy.  Denies any room spinning sensation.  Denies any episodes of passing out or near passing out.  No chest pain or palpitations.  Went to his primary care doctor today and was told he had A. fib and had orthostatic vital signs when standing.  Sent to ER for further eval.  He denies prior history of A. fib.  Not on anticoagulation.  Has history of idiopathic pulmonary fibrosis, hypertension on HCTZ.  HPI     Past Medical History:  Diagnosis Date  . Agatston CAC score, <100 04/2018  . Arthritis    NECK  . Cough    NON-PRODUCTIVE  . Depressive disorder 10/2011   follow by dr [facility] psychiatry; has anxiety and panic disorder as well.  . Diverticulosis   . Diverticulosis of colon    Followed by Dr. Henrene Pastor  . Essential hypertension   . Generalized anxiety disorder   . GERD (gastroesophageal reflux disease)    WATCHES DIET  . Hemorrhoid   . Hiatal hernia    Describes a sliding hiatal hernia  . History of colon polyps   . History of kidney stones   . Idiopathic pulmonary fibrosis ([place])   . Microscopic hematuria   . Nocturia   . Panic attacks   . Prostate cancer ([place]) 07/2012   Dr. Arnell Sieving urology.  . Sinus infection   . Tinnitus    LEFT EAR -- CHRONIC    Patient Active Problem List   Diagnosis Date Noted  . Therapeutic drug monitoring 08/31/2019  . Coronary artery calcification seen on CAT scan 05/06/2019  . Counseled about COVID-19 virus infection 01/17/2019  . IPF (idiopathic pulmonary fibrosis) ([place]) 02/09/2018  . Degeneration of lumbar intervertebral disc 10/21/2017  . Lumbar  radiculopathy 10/21/2017  . Prostate CA ([place]) 09/03/2012  . Major depressive disorder, recurrent episode ([place]) 10/29/2011  . Generalized anxiety disorder 10/29/2011  . Panic disorder with agoraphobia 10/29/2011  . Nausea 05/09/2011  . Heartburn 05/09/2011  . Dizziness 05/09/2011    Past Surgical History:  Procedure Laterality Date  . COLONOSCOPY  03/04/2013  . ESOPHAGOGASTRODUODENOSCOPY    . EXTRACORPOREAL SHOCK WAVE LITHOTRIPSY  YRS AGO  . PROSTATE BIOPSY    . RADIOACTIVE SEED IMPLANT N/A 02/02/2013   Procedure: RADIOACTIVE SEED IMPLANT;  Surgeon: Molli Hazard, MD;  Location: [facility];  Service: Urology;  Laterality: N/A;  . TONSILLECTOMY AND ADENOIDECTOMY  AS CHILD  . UPPER GASTROINTESTINAL ENDOSCOPY  2019       Family History  Problem Relation Age of Onset  . Dementia Mother   . Anxiety disorder Mother   . Alzheimer's disease Mother   . Bone cancer Father        Metastatic prostate  . Cancer Father        prostate  . Heart attack Father 72       Referred for CABG  . Anxiety disorder Sister   . Colon cancer Neg Hx   . Esophageal cancer Neg Hx   . Rectal cancer Neg Hx   . Stomach cancer Neg Hx   . Colon polyps Neg  Hx     Social History   Tobacco Use  . Smoking status: Never Smoker  . Smokeless tobacco: Never Used  Vaping Use  . Vaping Use: Never used  Substance Use Topics  . Alcohol use: No  . Drug use: No    Home Medications Prior to Admission medications   Medication Sig Start Date End Date Taking? Authorizing Provider  apixaban (ELIQUIS) 5 MG TABS tablet Take 1 tablet (5 mg total) by mouth 2 (two) times daily. 05/03/20 06/02/20 Yes Dmarcus Decicco, Ellwood Dense, MD  clonazePAM (KLONOPIN) 1 MG tablet Take 1 tablet at 8 am, 1/2 tablet at 2 pm and 1 tablet at bedtime for anxiety. Patient taking differently: Take 1 mg by mouth daily. 11/05/11  Yes Readling, Milana Huntsman, MD  diltiazem (CARDIZEM CD) 120 MG 24 hr capsule Take 1 capsule (120 mg total)  by mouth daily. 05/03/20  Yes Lucrezia Starch, MD  gabapentin (NEURONTIN) 300 MG capsule Take 600 mg by mouth 2 (two) times daily.    Yes [provider]  mirtazapine (REMERON) 30 MG tablet Take 30 mg by mouth at bedtime.  12/22/17  Yes [provider]  Nintedanib (OFEV) 150 MG CAPS Take 1 capsule (150 mg total) by mouth 2 (two) times daily. 02/28/20  Yes Brand Males, MD  nortriptyline (PAMELOR) 50 MG capsule Take 50 mg by mouth at bedtime.    Yes [provider]  pantoprazole (PROTONIX) 40 MG tablet TAKE 1 TABLET(40 MG) BY MOUTH DAILY 03/09/20  Yes Irene Shipper, MD  psyllium (METAMUCIL) 58.6 % powder Take 1 packet by mouth daily.   Yes [provider]  ondansetron (ZOFRAN) 4 MG tablet TAKE 1 TO 2 TABLETS BY MOUTH WITH OFEV Patient taking differently: Take 4 mg by mouth every 8 (eight) hours as needed for nausea or vomiting. 04/30/20   Irene Shipper, MD    Allergies    Aleve [naproxen sodium], Hyzaar [losartan potassium-hctz], Lisinopril, and Demerol [meperidine]  Review of Systems   Review of Systems  Constitutional: Negative for chills and fever.  HENT: Negative for ear pain and sore throat.   Eyes: Negative for pain and visual disturbance.  Respiratory: Negative for cough and shortness of breath.   Cardiovascular: Positive for palpitations. Negative for chest pain.  Gastrointestinal: Negative for abdominal pain and vomiting.  Genitourinary: Negative for dysuria and hematuria.  Musculoskeletal: Negative for arthralgias and back pain.  Skin: Negative for color change and rash.  Neurological: Positive for dizziness and light-headedness. Negative for seizures and syncope.  All other systems reviewed and are negative.   Physical Exam Updated Vital Signs BP 115/87   Pulse (!) 118   Temp 98.2 F (36.8 C) (Oral)   Resp (!) 29   Ht 5\' 9"  (1.753 m)   Wt 80 kg   SpO2 95%   BMI 26.05 kg/m   Physical Exam Vitals and nursing note reviewed.   Constitutional:      Appearance: He is well-developed.  HENT:     Head: Normocephalic and atraumatic.  Eyes:     Conjunctiva/sclera: Conjunctivae normal.  Cardiovascular:     Rate and Rhythm: Tachycardia present. Rhythm irregular.     Pulses: Normal pulses.     Heart sounds: No murmur heard.   Pulmonary:     Effort: Pulmonary effort is normal. No respiratory distress.     Breath sounds: Normal breath sounds.  Abdominal:     Palpations: Abdomen is soft.     Tenderness: There  is no abdominal tenderness.  Musculoskeletal:        General: No deformity or signs of injury.     Cervical back: Neck supple.  Skin:    General: Skin is warm and dry.  Neurological:     General: No focal deficit present.     Mental Status: He is alert.  Psychiatric:        Mood and Affect: Mood normal.     ED Results / Procedures / Treatments   Labs (all labs ordered are listed, but only abnormal results are displayed) Labs Reviewed  BASIC METABOLIC PANEL - Abnormal; Notable for the following components:      Result Value   Creatinine, Ser 1.50 (*)    GFR, Estimated 50 (*)    All other components within normal limits  D-DIMER, QUANTITATIVE - Abnormal; Notable for the following components:   D-Dimer, Quant 0.65 (*)    All other components within normal limits  CBC  URINALYSIS, ROUTINE W REFLEX MICROSCOPIC  CBG MONITORING, ED  TROPONIN I (HIGH SENSITIVITY)  TROPONIN I (HIGH SENSITIVITY)    EKG EKG Interpretation  Date/Time:  Thursday May 03 2020 18:10:49 EST Ventricular Rate:  136 PR Interval:    QRS Duration: 88 QT Interval:  298 QTC Calculation: 447 R Axis:   24 Text Interpretation: Atrial fibrillation Ventricular premature complex 12 Lead; [place] Confirmed by Madalyn Rob [phone]) on 05/03/2020 6:28:27 PM   Radiology DG Chest 2 View  Result Date: 05/03/2020 CLINICAL DATA:  Hypotension and dizziness EXAM: CHEST - 2 VIEW COMPARISON:  02/07/2020 FINDINGS: Cardiac shadow  is within normal limits. Peripheral fibrotic changes are noted in the lungs bilaterally similar to that seen on prior CT examination. No focal infiltrate or sizable effusion is seen. No bony abnormality is noted. IMPRESSION: Stable chronic fibrotic changes without acute abnormality. Electronically Signed   By: Inez Catalina M.D.   On: 05/03/2020 20:15    Procedures Procedures   Medications Ordered in ED Medications  apixaban (ELIQUIS) tablet 5 mg (5 mg Oral Given 05/03/20 2218)  sodium chloride 0.9 % bolus 1,000 mL (0 mLs Intravenous Stopped 05/03/20 2124)  diltiazem (CARDIZEM) injection 20 mg (20 mg Intravenous Given 05/03/20 1921)  diltiazem (CARDIZEM CD) 24 hr capsule 120 mg (120 mg Oral Given 05/03/20 2218)    ED Course  I have reviewed the triage vital signs and the nursing notes.  Pertinent labs & imaging results that were available during my care of the patient were reviewed by me and considered in my medical decision making (see chart for details).    MDM Rules/Calculators/A&P     CHA2DS2-VASc Score: 2                    70 year old male presents to ER from PCP office with concern for new onset A. fib, orthostatic vital signs.  On exam in ER, patient is well-appearing, noted to have A. fib on EKG.  Troponin x2 within normal limits, doubt ACS.  Age-adjusted D-dimer is within normal limits, no hypoxia, doubt pe. CXR negative for acute process.  His lab work is stable.  Creatinine is 1.5 but this is near baseline.  Provided fluids, single dose of IV Cardizem and observed patient.  Heart rate improved and remained 90s- low 100s.  BP remained stable.  Patient did well ambulating without difficulty and without symptoms.  Believe he is appropriate for outpatient management at present.  Reviewed anticoagulation, rate control with [facility] ER pharmacist.  Will start  Cardizem 24hr 120 Mg Daily and Eliquis 5 mg twice daily.  Recommend patient have close outpatient follow-up with his primary doctor and  cardiologist.  Instructed to call first thing tomorrow morning to get close appointments.  Reviewed return precautions in detail, discharged home.    After the discussed management above, the patient was determined to be safe for discharge.  The patient was in agreement with this plan and all questions regarding their care were answered.  ED return precautions were discussed and the patient will return to the ED with any significant worsening of condition.   Final Clinical Impression(s) / ED Diagnoses Final diagnoses:  Atrial fibrillation, unspecified type ([place])    Rx / DC Orders ED Discharge Orders         Ordered    apixaban (ELIQUIS) 5 MG TABS tablet  2 times daily        05/03/20 2305    Amb Referral to AFIB Clinic       Comments: Has seen Ellyn Hack previously, new onset afib today   05/03/20 2305    diltiazem (CARDIZEM CD) 120 MG 24 hr capsule  Daily        05/03/20 2305           Lucrezia Starch, MD 05/03/20 2354

## 2020-05-03 NOTE — ED Triage Notes (Signed)
Pt presents with c/o dizziness that started yesterday. Pt reports he was having some issues with his blood pressure being lower. Pt did go see his PCP and was orthostatic when pt stood up. Pt's PCP also believed he was in afib.

## 2020-05-03 NOTE — ED Notes (Signed)
Pt ambulated to restroom without difficulty. Pt denied dizziness and lightheadedness during ambulation.

## 2020-05-04 ENCOUNTER — Telehealth: Payer: Self-pay | Admitting: Cardiology

## 2020-05-04 ENCOUNTER — Telehealth (HOSPITAL_COMMUNITY): Payer: Self-pay

## 2020-05-04 DIAGNOSIS — I4891 Unspecified atrial fibrillation: Secondary | ICD-10-CM | POA: Diagnosis not present

## 2020-05-04 DIAGNOSIS — I10 Essential (primary) hypertension: Secondary | ICD-10-CM | POA: Diagnosis not present

## 2020-05-04 NOTE — Telephone Encounter (Signed)
Reached out to patient to schedule ED f/u. Patient stated that he wanted to schedule f/u with Dr. Ellyn Hack since that is his cardiologist. I informed him that if Dr. Ellyn Hack couldn't see him soon definitely reach out to Afib clinic and schedule appointment.

## 2020-05-04 NOTE — Telephone Encounter (Signed)
Did not need this encounter °

## 2020-05-07 ENCOUNTER — Telehealth: Payer: Self-pay | Admitting: *Deleted

## 2020-05-07 NOTE — Telephone Encounter (Signed)
Message received patient needs to see Dr Ellyn Hack or APp 1 to 2 weeks for new onset of afib.  Spoke to patient .  Patient states he went to ER  Last Thursday - afib - was given Diltiazem.  patient saw primary on 05/04/20 , Friday .   Patient was instructed to see cardiologist.  RN spoke to patient . Explain to him that it would be appropriate to see some one in Afib clinic. Dr Ellyn Hack would like for him to be establish with the Afib clinic- [facility].  Patient made aware that the clinic can do al testing and setting up procedure as necessary for afib. And that Dr Ellyn Hack has full confidence in the clinic.  patient is in agreement for appointment .  Direction and code # 1223 and phone number given. Patient verbalized understanding.

## 2020-05-08 NOTE — Telephone Encounter (Signed)
Afib Clinic sounds reasonable  Glenetta Hew, MD

## 2020-05-10 ENCOUNTER — Telehealth: Payer: Self-pay | Admitting: Internal Medicine

## 2020-05-10 DIAGNOSIS — J84112 Idiopathic pulmonary fibrosis: Secondary | ICD-10-CM

## 2020-05-10 NOTE — Telephone Encounter (Signed)
Yes, ok for routine labs prior to MR visit.   [name], DO [facility] 05/10/2020 12:18 PM

## 2020-05-10 NOTE — Telephone Encounter (Signed)
Called and spoke with patient to let him know that standing order has been set for every 3 months for LFT. His next due date would be 06/06/20. Patient expressed understanding. Nothing further needed at this time.

## 2020-05-10 NOTE — Telephone Encounter (Signed)
Called and spoke with patient. He was calling to check on getting lab orders for a LFT since he is taking Ofev. Last LFT was done back in January 2022. I checked his chart to see if he had any standing orders, he does not. I advised him that would need to get permission to place the lab order since either of them are in office today. He verbalized understanding.   BI, since neither [name] or MR are available. Are you ok with me ordering standing orders for a LFT to be checked every 3 months? I will order them under MR's name. Thanks!

## 2020-05-16 ENCOUNTER — Ambulatory Visit (HOSPITAL_COMMUNITY)
Admission: RE | Admit: 2020-05-16 | Discharge: 2020-05-16 | Disposition: A | Discharge status: Home / Self Care | Payer: PPO | Source: Ambulatory Visit | Attending: Physician Assistant | Admitting: Physician Assistant

## 2020-05-16 ENCOUNTER — Encounter (HOSPITAL_COMMUNITY): Payer: Self-pay | Admitting: Physician Assistant

## 2020-05-16 ENCOUNTER — Other Ambulatory Visit: Payer: Self-pay

## 2020-05-16 VITALS — BP 120/82 | HR 81 | Ht 69.0 in | Wt 179.8 lb

## 2020-05-16 DIAGNOSIS — I480 Paroxysmal atrial fibrillation: Secondary | ICD-10-CM | POA: Diagnosis not present

## 2020-05-16 DIAGNOSIS — Z886 Allergy status to analgesic agent status: Secondary | ICD-10-CM | POA: Insufficient documentation

## 2020-05-16 DIAGNOSIS — Z888 Allergy status to other drugs, medicaments and biological substances status: Secondary | ICD-10-CM | POA: Insufficient documentation

## 2020-05-16 DIAGNOSIS — D6869 Other thrombophilia: Secondary | ICD-10-CM | POA: Diagnosis not present

## 2020-05-16 DIAGNOSIS — Z7901 Long term (current) use of anticoagulants: Secondary | ICD-10-CM | POA: Diagnosis not present

## 2020-05-16 DIAGNOSIS — Z79899 Other long term (current) drug therapy: Secondary | ICD-10-CM | POA: Diagnosis not present

## 2020-05-16 DIAGNOSIS — J849 Interstitial pulmonary disease, unspecified: Secondary | ICD-10-CM | POA: Diagnosis not present

## 2020-05-16 DIAGNOSIS — I2510 Atherosclerotic heart disease of native coronary artery without angina pectoris: Secondary | ICD-10-CM | POA: Diagnosis not present

## 2020-05-16 DIAGNOSIS — I10 Essential (primary) hypertension: Secondary | ICD-10-CM | POA: Insufficient documentation

## 2020-05-16 NOTE — Progress Notes (Signed)
Primary Care Physician: Lujean Amel, MD Primary Cardiologist: Dr Ellyn Hack Primary Electrophysiologist: none Referring Physician: Dr Leanne Lovely ED   Eduardo Fuentes is a 70 y.o. male with a history of ILD, HTN, CAD, and atrial fibrillation who presents for consultation in the [facility].  The patient was initially diagnosed with atrial fibrillation 05/03/20 after presenting to his PCP office for dizziness. He was sent to the ED for evaluation. He was rate controlled with diltiazem and started on Eliquis for a CHADS2VASC score of 3. Patient was back in SR at follow up with his PCP the next day. There did not appear to be any specific triggers that he could identify.   Today, he denies symptoms of palpitations, chest pain, shortness of breath, orthopnea, PND, lower extremity edema, dizziness, presyncope, syncope, snoring, daytime somnolence, bleeding, or neurologic sequela. The patient is tolerating medications without difficulties and is otherwise without complaint today.    Atrial Fibrillation Risk Factors:  he does not have symptoms or diagnosis of sleep apnea. he does not have a history of rheumatic fever. he does not have a history of alcohol use.   he has a BMI of Body mass index is 26.55 kg/m.Marland Kitchen Filed Weights   05/16/20 1135  Weight: 81.6 kg    Family History  Problem Relation Age of Onset  . Dementia Mother   . Anxiety disorder Mother   . Alzheimer's disease Mother   . Bone cancer Father        Metastatic prostate  . Cancer Father        prostate  . Heart attack Father 16       Referred for CABG  . Anxiety disorder Sister   . Colon cancer Neg Hx   . Esophageal cancer Neg Hx   . Rectal cancer Neg Hx   . Stomach cancer Neg Hx   . Colon polyps Neg Hx      Atrial Fibrillation Management history:  Previous antiarrhythmic drugs: none Previous cardioversions: none Previous ablations: none CHADS2VASC score: 3 Anticoagulation  history: Eliquis   Past Medical History:  Diagnosis Date  . Agatston CAC score, <100 04/2018  . Arthritis    NECK  . Cough    NON-PRODUCTIVE  . Depressive disorder 10/2011   follow by dr [facility] psychiatry; has anxiety and panic disorder as well.  . Diverticulosis   . Diverticulosis of colon    Followed by Dr. Henrene Pastor  . Essential hypertension   . Generalized anxiety disorder   . GERD (gastroesophageal reflux disease)    WATCHES DIET  . Hemorrhoid   . Hiatal hernia    Describes a sliding hiatal hernia  . History of colon polyps   . History of kidney stones   . Idiopathic pulmonary fibrosis ([place])   . Microscopic hematuria   . Nocturia   . Panic attacks   . Prostate cancer ([place]) 07/2012   Dr. Arnell Sieving urology.  . Sinus infection   . Tinnitus    LEFT EAR -- CHRONIC   Past Surgical History:  Procedure Laterality Date  . COLONOSCOPY  03/04/2013  . ESOPHAGOGASTRODUODENOSCOPY    . EXTRACORPOREAL SHOCK WAVE LITHOTRIPSY  YRS AGO  . PROSTATE BIOPSY    . RADIOACTIVE SEED IMPLANT N/A 02/02/2013   Procedure: RADIOACTIVE SEED IMPLANT;  Surgeon: Molli Hazard, MD;  Location: [facility];  Service: Urology;  Laterality: N/A;  . TONSILLECTOMY AND ADENOIDECTOMY  AS CHILD  . UPPER GASTROINTESTINAL ENDOSCOPY  2019  Current Outpatient Medications  Medication Sig Dispense Refill  . apixaban (ELIQUIS) 5 MG TABS tablet Take 1 tablet (5 mg total) by mouth 2 (two) times daily. 60 tablet 0  . clonazePAM (KLONOPIN) 1 MG tablet Take 1 tablet at 8 am, 1/2 tablet at 2 pm and 1 tablet at bedtime for anxiety. 75 tablet 0  . diltiazem (CARDIZEM CD) 120 MG 24 hr capsule Take 1 capsule (120 mg total) by mouth daily. 30 capsule 0  . gabapentin (NEURONTIN) 300 MG capsule Take 600 mg by mouth 2 (two) times daily.     . mirtazapine (REMERON) 30 MG tablet Take 30 mg by mouth at bedtime.   4  . Nintedanib (OFEV) 150 MG CAPS Take 1 capsule (150 mg total) by mouth 2 (two) times  daily. 180 capsule 3  . nortriptyline (PAMELOR) 50 MG capsule Take 50 mg by mouth at bedtime.     . ondansetron (ZOFRAN) 4 MG tablet TAKE 1 TO 2 TABLETS BY MOUTH WITH OFEV 120 tablet 0  . pantoprazole (PROTONIX) 40 MG tablet TAKE 1 TABLET(40 MG) BY MOUTH DAILY 90 tablet 1  . polyethylene glycol (MIRALAX) 17 g packet Take 17 g by mouth daily as needed for mild constipation or moderate constipation.    . psyllium (METAMUCIL) 58.6 % powder Take 1 packet by mouth daily.     Current Facility-Administered Medications  Medication Dose Route Frequency Provider Last Rate Last Admin  . 0.9 %  sodium chloride infusion  500 mL Intravenous Once Irene Shipper, MD        Allergies  Allergen Reactions  . Aleve [Naproxen Sodium] Anaphylaxis and Other (See Comments)    GI UPSET Patient does not remember anaphylaxis  . Hyzaar [Losartan Potassium-Hctz] Other (See Comments)    Felt bad   . Lisinopril Cough  . Demerol [Meperidine] Rash         Social History   Socioeconomic History  . Marital status: Married    Spouse name: [name]  . Number of children: 0  . Years of education: Not on file  . Highest education level: Not on file  Occupational History  . Occupation: retired  Tobacco Use  . Smoking status: Never Smoker  . Smokeless tobacco: Never Used  Vaping Use  . Vaping Use: Never used  Substance and Sexual Activity  . Alcohol use: No  . Drug use: No  . Sexual activity: Never  Other Topics Concern  . 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  No caffeine.   Social Determinants of Health   Financial Resource Strain: Not on file  Food Insecurity: Not on file  Transportation Needs: Not on file  Physical Activity: Not on file  Stress: Not on file  Social Connections: Not on file  Intimate Partner Violence: Not on  file     ROS- All systems are reviewed and negative except as per the HPI above.  Physical Exam: Vitals:   05/16/20 1135  BP: 120/82  Pulse: 81  Weight: 81.6 kg  Height: 5\' 9"  (1.753 m)    GEN- The patient is a well appearing male, alert and oriented x 3 today.   Head- normocephalic, atraumatic Eyes-  Sclera clear, conjunctiva pink Ears- hearing intact Oropharynx- clear Neck- supple  Lungs- faint bibasilar crackles, normal  work of breathing Heart- Regular rate and rhythm, no murmurs, rubs or gallops  GI- soft, NT, ND, + BS Extremities- no clubbing, cyanosis, or edema MS- no significant deformity or atrophy Skin- no rash or lesion Psych- euthymic mood, full affect Neuro- strength and sensation are intact  Wt Readings from Last 3 Encounters:  05/16/20 81.6 kg  05/03/20 80 kg  03/08/20 79.9 kg    EKG today demonstrates  SR Vent. rate 81 BPM PR interval 172 ms QRS duration 80 ms QT/QTc 346/401 ms  Epic records are reviewed at length today  CHA2DS2-VASc Score = 3  The patient's score is based upon: CHF History: No HTN History: Yes Diabetes History: No Stroke History: No Vascular Disease History: Yes Age Score: 1 Gender Score: 0      ASSESSMENT AND PLAN: 1. Paroxysmal atrial fibrillation The patient's CHA2DS2-VASc score is 3, indicating a 3.2% annual risk of stroke.   General education about afib provided and questions answered. We also discussed his stroke risk and the risks and benefits of anticoagulation. Patient is back in SR. Check echocardiogram Continue Eliquis 5 mg BID Continue diltiazem 120 mg daily Could consider AAD if his arrhythmia becomes more persistent.   2. Secondary Hypercoagulable State (ICD10:  D68.69) The patient is at significant risk for stroke/thromboembolism based upon his CHA2DS2-VASc Score of 3.  Continue Apixaban (Eliquis).   3. HTN Stable, no changes today.  4. CAD Noted on CT No anginal symptoms.   Follow up in the  AF clinic in 4-6 weeks.    [facility] [address] [place], [place] [address] [phone] 05/16/2020 11:40 AM

## 2020-05-23 ENCOUNTER — Ambulatory Visit (HOSPITAL_COMMUNITY)
Admission: RE | Admit: 2020-05-23 | Discharge: 2020-05-23 | Disposition: A | Discharge status: Home / Self Care | Payer: PPO | Source: Ambulatory Visit | Attending: Physician Assistant | Admitting: Physician Assistant

## 2020-05-23 ENCOUNTER — Encounter (HOSPITAL_COMMUNITY): Payer: Self-pay | Admitting: Physician Assistant

## 2020-05-23 ENCOUNTER — Other Ambulatory Visit: Payer: Self-pay

## 2020-05-23 VITALS — BP 134/80 | HR 66 | Ht 69.0 in | Wt 178.6 lb

## 2020-05-23 DIAGNOSIS — Z8249 Family history of ischemic heart disease and other diseases of the circulatory system: Secondary | ICD-10-CM | POA: Insufficient documentation

## 2020-05-23 DIAGNOSIS — Z79899 Other long term (current) drug therapy: Secondary | ICD-10-CM | POA: Diagnosis not present

## 2020-05-23 DIAGNOSIS — I480 Paroxysmal atrial fibrillation: Secondary | ICD-10-CM | POA: Diagnosis not present

## 2020-05-23 DIAGNOSIS — J849 Interstitial pulmonary disease, unspecified: Secondary | ICD-10-CM | POA: Insufficient documentation

## 2020-05-23 DIAGNOSIS — Z7901 Long term (current) use of anticoagulants: Secondary | ICD-10-CM | POA: Insufficient documentation

## 2020-05-23 DIAGNOSIS — I10 Essential (primary) hypertension: Secondary | ICD-10-CM | POA: Diagnosis not present

## 2020-05-23 DIAGNOSIS — D6869 Other thrombophilia: Secondary | ICD-10-CM

## 2020-05-23 DIAGNOSIS — I2510 Atherosclerotic heart disease of native coronary artery without angina pectoris: Secondary | ICD-10-CM | POA: Insufficient documentation

## 2020-05-23 NOTE — Progress Notes (Signed)
Primary Care Physician: Lujean Amel, MD Primary Cardiologist: Dr Ellyn Hack Primary Electrophysiologist: none Referring Physician: Dr Leanne Lovely ED   Eduardo Fuentes is a 70 y.o. male with a history of ILD, HTN, CAD, and atrial fibrillation who presents for follow up in the [facility]. The patient was initially diagnosed with atrial fibrillation 05/03/20 after presenting to his PCP office for dizziness. He was sent to the ED for evaluation. He was rate controlled with diltiazem and started on Eliquis for a CHADS2VASC score of 3. Patient was back in SR at follow up with his PCP the next day. There did not appear to be any specific triggers that he could identify.   On follow up today, patient reports that his BP has been more labile at home with some readings in 160s/100s. He becomes very anxious about his BP. He also noted some tenderness and mild swelling on the front of his left tibia. He does not recall injuring the area but does admit to doing a lot of walking two days prior. This is resolving.   Today, he denies symptoms of palpitations, chest pain, shortness of breath, orthopnea, PND, lower extremity edema, dizziness, presyncope, syncope, snoring, daytime somnolence, bleeding, or neurologic sequela. The patient is tolerating medications without difficulties and is otherwise without complaint today.    Atrial Fibrillation Risk Factors:  he does not have symptoms or diagnosis of sleep apnea. he does not have a history of rheumatic fever. he does not have a history of alcohol use.   he has a BMI of Body mass index is 26.37 kg/m.Marland Kitchen Filed Weights   05/23/20 1359  Weight: 81 kg    Family History  Problem Relation Age of Onset  . Dementia Mother   . Anxiety disorder Mother   . Alzheimer's disease Mother   . Bone cancer Father        Metastatic prostate  . Cancer Father        prostate  . Heart attack Father 64       Referred for CABG  .  Anxiety disorder Sister   . Colon cancer Neg Hx   . Esophageal cancer Neg Hx   . Rectal cancer Neg Hx   . Stomach cancer Neg Hx   . Colon polyps Neg Hx      Atrial Fibrillation Management history:  Previous antiarrhythmic drugs: none Previous cardioversions: none Previous ablations: none CHADS2VASC score: 3 Anticoagulation history: Eliquis   Past Medical History:  Diagnosis Date  . Agatston CAC score, <100 04/2018  . Arthritis    NECK  . Cough    NON-PRODUCTIVE  . Depressive disorder 10/2011   follow by dr [facility] psychiatry; has anxiety and panic disorder as well.  . Diverticulosis   . Diverticulosis of colon    Followed by Dr. Henrene Pastor  . Essential hypertension   . Generalized anxiety disorder   . GERD (gastroesophageal reflux disease)    WATCHES DIET  . Hemorrhoid   . Hiatal hernia    Describes a sliding hiatal hernia  . History of colon polyps   . History of kidney stones   . Idiopathic pulmonary fibrosis ([place])   . Microscopic hematuria   . Nocturia   . Panic attacks   . Prostate cancer ([place]) 07/2012   Dr. Arnell Sieving urology.  . Sinus infection   . Tinnitus    LEFT EAR -- CHRONIC   Past Surgical History:  Procedure Laterality Date  . COLONOSCOPY  03/04/2013  .  ESOPHAGOGASTRODUODENOSCOPY    . EXTRACORPOREAL SHOCK WAVE LITHOTRIPSY  YRS AGO  . PROSTATE BIOPSY    . RADIOACTIVE SEED IMPLANT N/A 02/02/2013   Procedure: RADIOACTIVE SEED IMPLANT;  Surgeon: Molli Hazard, MD;  Location: [facility];  Service: Urology;  Laterality: N/A;  . TONSILLECTOMY AND ADENOIDECTOMY  AS CHILD  . UPPER GASTROINTESTINAL ENDOSCOPY  2019    Current Outpatient Medications  Medication Sig Dispense Refill  . apixaban (ELIQUIS) 5 MG TABS tablet Take 1 tablet (5 mg total) by mouth 2 (two) times daily. 60 tablet 0  . clonazePAM (KLONOPIN) 1 MG tablet Take 1 tablet at 8 am, 1/2 tablet at 2 pm and 1 tablet at bedtime for anxiety. 75 tablet 0  . diltiazem  (CARDIZEM CD) 120 MG 24 hr capsule Take 1 capsule (120 mg total) by mouth daily. 30 capsule 0  . gabapentin (NEURONTIN) 300 MG capsule Take 600 mg by mouth 2 (two) times daily.     . mirtazapine (REMERON) 30 MG tablet Take 30 mg by mouth at bedtime.   4  . Nintedanib (OFEV) 150 MG CAPS Take 1 capsule (150 mg total) by mouth 2 (two) times daily. 180 capsule 3  . nortriptyline (PAMELOR) 50 MG capsule Take 50 mg by mouth at bedtime.     . ondansetron (ZOFRAN) 4 MG tablet TAKE 1 TO 2 TABLETS BY MOUTH WITH OFEV 120 tablet 0  . pantoprazole (PROTONIX) 40 MG tablet TAKE 1 TABLET(40 MG) BY MOUTH DAILY 90 tablet 1  . polyethylene glycol (MIRALAX / GLYCOLAX) 17 g packet Take 17 g by mouth daily as needed for mild constipation or moderate constipation.    . psyllium (METAMUCIL) 58.6 % powder Take 1 packet by mouth daily.     Current Facility-Administered Medications  Medication Dose Route Frequency Provider Last Rate Last Admin  . 0.9 %  sodium chloride infusion  500 mL Intravenous Once Irene Shipper, MD        Allergies  Allergen Reactions  . Aleve [Naproxen Sodium] Anaphylaxis and Other (See Comments)    GI UPSET Patient does not remember anaphylaxis  . Hyzaar [Losartan Potassium-Hctz] Other (See Comments)    Felt bad   . Lisinopril Cough  . Demerol [Meperidine] Rash         Social History   Socioeconomic History  . Marital status: Married    Spouse name: [name]  . Number of children: 0  . Years of education: Not on file  . Highest education level: Not on file  Occupational History  . Occupation: retired  Tobacco Use  . Smoking status: Never Smoker  . Smokeless tobacco: Never Used  Vaping Use  . Vaping Use: Never used  Substance and Sexual Activity  . Alcohol use: No  . Drug use: No  . Sexual activity: Never  Other Topics Concern  . 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  No caffeine.   Social Determinants of Health   Financial Resource Strain: Not on file  Food Insecurity: Not on file  Transportation Needs: Not on file  Physical Activity: Not on file  Stress: Not on file  Social Connections: Not on file  Intimate Partner Violence: Not on file     ROS- All systems are reviewed and negative except as  per the HPI above.  Physical Exam: Vitals:   05/23/20 1359  BP: 134/80  Pulse: 66  Weight: 81 kg  Height: 5\' 9"  (1.753 m)    GEN- The patient is a well appearing male, alert and oriented x 3 today.   HEENT-head normocephalic, atraumatic, sclera clear, conjunctiva pink, hearing intact, trachea midline. Lungs- Clear to ausculation bilaterally, normal work of breathing Heart- Regular rate and rhythm, no murmurs, rubs or gallops  GI- soft, NT, ND, + BS Extremities- no clubbing, cyanosis, or edema MS- no significant deformity or atrophy Skin- no rash or lesion Psych- euthymic mood, full affect Neuro- strength and sensation are intact   Wt Readings from Last 3 Encounters:  05/23/20 81 kg  05/16/20 81.6 kg  05/03/20 80 kg    EKG not ordered today.  Epic records are reviewed at length today  CHA2DS2-VASc Score = 3  The patient's score is based upon: CHF History: No HTN History: Yes Diabetes History: No Stroke History: No Vascular Disease History: Yes Age Score: 1 Gender Score: 0      ASSESSMENT AND PLAN: 1. Paroxysmal atrial fibrillation The patient's CHA2DS2-VASc score is 3, indicating a 3.2% annual risk of stroke.   Continue Eliquis 5 mg BID Continue diltiazem 120 mg daily Could consider AAD if his arrhythmia becomes more persistent.  Echocardiogram pending.  2. Secondary Hypercoagulable State (ICD10:  D68.69) The patient is at significant risk for stroke/thromboembolism based upon his CHA2DS2-VASc Score of 3.  Continue Apixaban (Eliquis).   3.  HTN Patient brings in BP machine from home. It reads ~15-20 points higher than manual cuff. Encouraged patient to check BP only twice daily and keep a log for review. Could up titrate diltiazem if needed.   4. CAD Noted on CT No anginal symptoms.   Follow up in the AF clinic as scheduled.    [facility] [address] [place], [place] [address] [phone] 05/23/2020 2:04 PM

## 2020-05-25 DIAGNOSIS — M79662 Pain in left lower leg: Secondary | ICD-10-CM | POA: Diagnosis not present

## 2020-05-25 DIAGNOSIS — I4891 Unspecified atrial fibrillation: Secondary | ICD-10-CM | POA: Diagnosis not present

## 2020-05-25 DIAGNOSIS — S8012XA Contusion of left lower leg, initial encounter: Secondary | ICD-10-CM | POA: Diagnosis not present

## 2020-05-29 ENCOUNTER — Other Ambulatory Visit (HOSPITAL_COMMUNITY): Payer: Self-pay | Admitting: *Deleted

## 2020-05-29 MED ORDER — DILTIAZEM HCL ER COATED BEADS 120 MG PO CP24: 120.0000 mg | ORAL_CAPSULE | Freq: Every day | ORAL | 3 refills | Status: DC

## 2020-05-29 MED ORDER — APIXABAN 5 MG PO TABS: 5.0000 mg | ORAL_TABLET | Freq: Two times a day (BID) | ORAL | 3 refills | Status: DC

## 2020-06-11 ENCOUNTER — Other Ambulatory Visit: Payer: Self-pay | Admitting: Internal Medicine

## 2020-06-11 DIAGNOSIS — J84112 Idiopathic pulmonary fibrosis: Secondary | ICD-10-CM

## 2020-06-11 DIAGNOSIS — Z006 Encounter for examination for normal comparison and control in clinical research program: Secondary | ICD-10-CM

## 2020-06-12 ENCOUNTER — Other Ambulatory Visit (INDEPENDENT_AMBULATORY_CARE_PROVIDER_SITE_OTHER): Payer: PPO

## 2020-06-12 DIAGNOSIS — J84112 Idiopathic pulmonary fibrosis: Secondary | ICD-10-CM

## 2020-06-12 DIAGNOSIS — Z5181 Encounter for therapeutic drug level monitoring: Secondary | ICD-10-CM | POA: Diagnosis not present

## 2020-06-12 LAB — CBC
HCT: 47.1 % (ref 39.0–52.0)
Hemoglobin: 15.7 g/dL (ref 13.0–17.0)
MCHC: 33.3 g/dL (ref 30.0–36.0)
MCV: 94.7 fl (ref 78.0–100.0)
Platelets: 283 K/uL (ref 150.0–400.0)
RBC: 4.97 Mil/uL (ref 4.22–5.81)
RDW: 14.7 % (ref 11.5–15.5)
WBC: 6.4 K/uL (ref 4.0–10.5)

## 2020-06-12 LAB — HEPATIC FUNCTION PANEL
ALT: 17 U/L (ref 0–53)
AST: 22 U/L (ref 0–37)
Albumin: 3.8 g/dL (ref 3.5–5.2)
Alkaline Phosphatase: 91 U/L (ref 39–117)
Bilirubin, Direct: 0.1 mg/dL (ref 0.0–0.3)
Total Bilirubin: 0.5 mg/dL (ref 0.2–1.2)
Total Protein: 6.9 g/dL (ref 6.0–8.3)

## 2020-06-12 NOTE — Progress Notes (Signed)
LFT normal

## 2020-06-21 ENCOUNTER — Encounter (INDEPENDENT_AMBULATORY_CARE_PROVIDER_SITE_OTHER): Payer: PPO | Admitting: Internal Medicine

## 2020-06-21 ENCOUNTER — Encounter: Payer: PPO | Admitting: *Deleted

## 2020-06-21 ENCOUNTER — Ambulatory Visit (INDEPENDENT_AMBULATORY_CARE_PROVIDER_SITE_OTHER)
Admission: RE | Admit: 2020-06-21 | Discharge: 2020-06-21 | Disposition: A | Discharge status: Home / Self Care | Payer: Self-pay | Source: Ambulatory Visit | Attending: Internal Medicine | Admitting: Internal Medicine

## 2020-06-21 ENCOUNTER — Other Ambulatory Visit: Payer: Self-pay

## 2020-06-21 DIAGNOSIS — Z006 Encounter for examination for normal comparison and control in clinical research program: Secondary | ICD-10-CM

## 2020-06-21 DIAGNOSIS — J84112 Idiopathic pulmonary fibrosis: Secondary | ICD-10-CM

## 2020-06-21 DIAGNOSIS — J8410 Pulmonary fibrosis, unspecified: Secondary | ICD-10-CM | POA: Diagnosis not present

## 2020-06-21 DIAGNOSIS — M47814 Spondylosis without myelopathy or radiculopathy, thoracic region: Secondary | ICD-10-CM | POA: Diagnosis not present

## 2020-06-21 DIAGNOSIS — I2510 Atherosclerotic heart disease of native coronary artery without angina pectoris: Secondary | ICD-10-CM | POA: Diagnosis not present

## 2020-06-21 DIAGNOSIS — J479 Bronchiectasis, uncomplicated: Secondary | ICD-10-CM | POA: Diagnosis not present

## 2020-06-21 NOTE — Research (Signed)
Title: A randomized, double-blind, multicenter, dose-ranging, placebo-controlled Phase 2a evaluation of the safety, tolerability and pharmacokinetics of [id number] in participants with idiopathic pulmonary fibrosis (IPF)   Patients will be randomized in a 3:1 ratio to one of two treatment arms:  320 mg [id number] or placebo.   Protocol #: [id number], Clinical Trials EUDRACT# [id number]  ** Sponsor [employer] [email or web address]   ([place] [address])   Protocol Version Amendment 3 (ver.3.0) for 06/21/2020 Date of 19OCT2021  (double checked yes) Consent Version 5.1 dated 11Jan2022, approved  for 06/21/2020 date of  (double checked yes) Investigator Brochure Version 5.0  Addendum for 06/21/2020 date of 21JAN2022   Key Inclusion Criteria:   Age >/= 71 years old Confident diagnosis of IPF  up to 5 years. If IPF diagnosis is within > 3 to = 5 years at screening, the participant must have evidence of progression within the last 24 months, as defined by decline in FVC percent predicted based on a relative decline of = 5% FVC% pred >/=45%  DLCO (hgb-adjusted) >/=30% Treatment with nintedanib or pirfenidone allowed, But must have stable dose =3 months before Screening Visit and remain unchanged during the study   Key Exclusion Criteria:   Receiving any non-approved agent intended for treatment of fibrosis in IPF FEV1 over the FVC ratio < 0.7 at screening Active infection,  that can affect FVC measurement during Screening or  Randomization Extent of emphysema > fibrotic changes on most recent HRCT IPF exacerbation within 6 months of Screening  Severe pulmonary hypertension Smoking of any kind w/in 3 months of Screening or unwilling to avoid smoking throughout the study Renal impairment or end-stage kidney disease requiring dialysis History of unstable /deteriorating cardiac/pulmonary disease (other than IPF) w/in 6 months of Screening Any ongoing known malignancy, except  for localized cancers (i.e. basal cell carcinoma) Medical or surgical condition known to affect drug absorption Surgeries scheduled during the study period    Side-Effects: [id number] was generally well tolerated in over 280 healthy participants with no clinical safety concerns or dose-limiting toxicities observed for single doses up to $Rem'640mg'qgHD$  and multiple doses up to $Rem'320mg'AutP$  QD administered for up to 14 days. The major side-effects reported have been headache (mild) and constipation (mild) in 3-76% of subjects. Other side effects, in 1-2% of subjects, were mild to moderate, with fever being the only severe side-effect reported.  Mechanism: Selective dual aV6 and aV1 integrin inhibitor with antifibrotic activity. [id number] prevents aV6 and aV1 from binding to the RGD sequence of latency-associated peptide (LAP), this blocks the release of activated TGF-1, thereby blocking the activation of pro-fibrotic pathways. Additionally, [id number] has been shown to reduce collagen synthesis and gene expression in human IPF tissue.   Interactions: Acts as a substrate of P-gp, OATP1B1, OATP1B3, OAT3 Inhibitors or inducers of these enzymes may affect the intestinal absorption, hepatic uptake or renal uptake of [id number] Metabolized by CYP3A4, CYP2C9, CYP2C19 Potent inhibitors or inducers of these enzymes should be avoided Each CYP enzyme metabolizes < 10% of the parent compound Since multiple CYP enzymes are involved, clinically relevant drug-drug interactions are less likely, as there are other alternative pathways A potent inhibitor of multiple pathways should be used with caution as that has the potential to elicit a clinically relevant drug-drug interaction i.e. medication that inhibits CYP3A4, CYP2C9, CYP2C19 [id number] is not an inducer of CYP1A2, CYP2B6, or CYP3A4 Showed moderate inhibition of P-gp and MATE1, substrates of P-gp and MATE1 may  have increased exposure when coadministered with  [id number] Metabolized  by extrahepatic enzymes: CYP1A1, UGT1A7 Clinical relevance relating to drug interactions is unknown Stable when incubated with human recombinant MAO-A and MAO-B [id number] is not an inhibitor or inducer of CYP isoforms and may be co-administered with substrates of CYP isoforms without risk of DDI.  Interactions with specific drugs: Potential for increased exposure of nintedanib Recommend separating time of administration of [id number] and nintedanib Drug interactions with pirfenidone are not expected Use of corticosteroids should be discussed beforehand with the medical monitor   Clinical drug interactions studies have not been performed at this time and therefore guidance is based on the in-vitro studies  Disallowed Medications/Foods/Supplements: Fluconazole Digoxin Rifampin Grapefruit Grapefruit-containing foods and beverages St. John's Wort   Pharmacokinetics: Tmax : 2-3 hours Half-life: approximately 48 hours Steady state reached in 5-7 days Minimal renal clearance (8.5% of drug is cleared by the kidneys)    Administration [phone] should be administered on an empty stomach May be administered via feeding tube   Safety:   Edition Addendum to Edition 5.0 - 16 March 2020   Animal studies: No effect on neurobehavior No effect on respiratory parameters No effects on blood pressure Showed an increase in QT and QTc The high dose 400 mg/kg showed an increase in mean heart rate   Cardiovascular effects in mice after administration of [id number]: Dose-dependent increases in blood pressure Increase in PR and QRS duration Increase in arrhythmias  Reproductive animal toxicology studies at the low and medium doses, 150 and 300 mg/kg/day, revealed no impact on: maternal survival, fetal body weight, food parameters, uterine and ovarian parameters, no external malformations  When the high dose 600 mg/kg/day was tested in rabbits, the rabbits displayed  increased morbidities and increased abortions   Phase 1 studies:   Type of TEAE [id number] n=32 Placebo n=14 GI disorders 7 (22%) 0 Infections 3 (9%) 0 Musculoskeletal 1 (3%) 0 Nervous system disorders 2 (6%) 0   Most common treatment-emergent adverse event (TEAE):  headache  The most common drug-related adverse events were: nausea, diarrhea, abdominal distension. These occurred in 12.5% of participants  There were no deaths or serious adverse events in any of the Phase I studies There were no clinically meaningful changes in vital signs, telemetry or laboratory parameters [phone] did not display any clinically significant effect on ECG (iIncluding Qtc, HR, RR, PR and QRS)  Study [id number] All TEAEs were mild and deemed unrelated to study drug One TEAE, viral infection, led to discontinuation of IP No deaths nor SAEs  As of the 25 November 2019 cutoff date, preliminary data from ongoing and discontinued Phase 2a studies suggest no new safety concerns.  All SAE's reported to date were considered unrelated to study drug by the investigator. All treatment-emergent SAE's were known manifestations of the underlying  diseases (IPF, PSC or ARDS) and considered by the investigator to be most likely due to disease progresssion.  Part D of the study will evaluate approximately 28 participants at the 320 mg dose for at least 24 weeks to further characterize the safety profile of [id number]-31497. Ongoing safety and FVC assessments will continue until the last participant reaches 24 weeks of the study; therefore, safety and FVC assessments may extend to 48 weeks in participants randomized earlier in the study.  Clinical Research Coordinator / Research RN note : This visit for Subject Eduardo Fuentes with DOB: 18-Jun-1950 on 06/21/2020 for the above protocol is Visit #1 and is for purpose of research.   The consent for this encounter is under Protocol Version  Amendment 3.0 , Agricultural engineer Edition Version 5 Addendum, Consent Version 5.1 and is currently IRB approved.   Subject expressed continued interest and consent in continuing as a study subject. Subject confirmed that there was  no change in contact information (e.g. address, telephone, email). Subject thanked for participation in research and contribution to science.  In this visit 06/21/2020 the subject will be evaluated by investigator named Dr. Chase Caller . This research coordinator has verified that the investigator is up to date with his/her training logs.  Because this visit is a key visit of screening, this visit is under direct supervision of the PI Dr. Chase Caller . This PI is available for this visit.   The subject was allowed ample time to review the informed consent form and all questions were answered. No study related procedures were performed prior to the consent being signed. The consent was signed and the PI Dr. Chase Caller was present for the consent process. Refer to the Informed Consent Process Documentation Checklist for details of the consent process.After the consent was signed, all study visit 1 procedures and assessments were completed per the above mentioned protocol. Refer to the subject's study binder for further details of the visit. If all study criteria are met, the subject will return within the next 28 days for study visit 2.  Signed by Hale Drone, MS, [place] Coordinator  [facility]  [place], [place] 3:39 PM 06/21/2020

## 2020-06-22 ENCOUNTER — Ambulatory Visit (HOSPITAL_COMMUNITY)
Admission: RE | Admit: 2020-06-22 | Discharge: 2020-06-22 | Disposition: A | Discharge status: Home / Self Care | Payer: PPO | Source: Ambulatory Visit | Attending: Physician Assistant | Admitting: Physician Assistant

## 2020-06-22 DIAGNOSIS — I480 Paroxysmal atrial fibrillation: Secondary | ICD-10-CM | POA: Insufficient documentation

## 2020-06-22 DIAGNOSIS — J8410 Pulmonary fibrosis, unspecified: Secondary | ICD-10-CM | POA: Diagnosis not present

## 2020-06-22 DIAGNOSIS — I517 Cardiomegaly: Secondary | ICD-10-CM | POA: Insufficient documentation

## 2020-06-22 HISTORY — PX: TRANSTHORACIC ECHOCARDIOGRAM: SHX275

## 2020-06-22 LAB — ECHOCARDIOGRAM COMPLETE
AR max vel: 2.38 cm2
AV Area VTI: 2.39 cm2
AV Area mean vel: 2.05 cm2
AV Mean grad: 2 mmHg
AV Peak grad: 3.3 mmHg
Ao pk vel: 0.91 m/s
Area-P 1/2: 1.76 cm2
MV VTI: 2.05 cm2
S' Lateral: 2.4 cm

## 2020-06-22 NOTE — Patient Instructions (Signed)
ICD-10-CM   1. Research study patient  Z00.6   2. IPF (idiopathic pulmonary fibrosis) (HCC)  J84.112     

## 2020-06-22 NOTE — Progress Notes (Signed)
  Echocardiogram 2D Echocardiogram has been performed.  Eduardo Fuentes 06/22/2020, 11:04 AM

## 2020-06-22 NOTE — Progress Notes (Signed)
Title: A randomized, double-blind, multicenter, dose-ranging, placebo-controlled Phase 2a evaluation of the safety, tolerability and pharmacokinetics of [id number] in participants with idiopathic pulmonary fibrosis (IPF)   Patients will be randomized in a 3:1 ratio to one of two treatment arms:  320 mg [id number] or placebo.   Protocol #: [id number], Clinical Trials EUDRACT# [id number]  ** Sponsor [employer] [email or web address]   ([place] [address])   Key Inclusion Criteria:   Age >/= 71 years old Confident diagnosis of IPF  up to 5 years. If IPF diagnosis is within > 3 to = 5 years at screening, the participant must have evidence of progression within the last 24 months, as defined by decline in FVC percent predicted based on a relative decline of = 5% FVC% pred >/=45%  DLCO (hgb-adjusted) >/=30% Treatment with nintedanib or pirfenidone allowed, But must have stable dose =3 months before Screening Visit and remain unchanged during the study   Key Exclusion Criteria:   Receiving any non-approved agent intended for treatment of fibrosis in IPF FEV1 over the FVC ratio < 0.7 at screening Active infection,  that can affect FVC measurement during Screening or  Randomization Extent of emphysema > fibrotic changes on most recent HRCT IPF exacerbation within 6 months of Screening  Severe pulmonary hypertension Smoking of any kind w/in 3 months of Screening or unwilling to avoid smoking throughout the study Renal impairment or end-stage kidney disease requiring dialysis History of unstable /deteriorating cardiac/pulmonary disease (other than IPF) w/in 6 months of Screening Any ongoing known malignancy, except for localized cancers (i.e. basal cell carcinoma) Medical or surgical condition known to affect drug absorption Surgeries scheduled during the study period    Side-Effects: [id number] was generally well tolerated in over 280 healthy participants with no  clinical safety concerns or dose-limiting toxicities observed for single doses up to 640mg  and multiple doses up to 320mg  QD administered for up to 14 days. The major side-effects reported have been headache (mild) and constipation (mild) in 3-76% of subjects. Other side effects, in 1-2% of subjects, were mild to moderate, with fever being the only severe side-effect reported.  Mechanism: Selective dual aV6 and aV1 integrin inhibitor with antifibrotic activity. [id number] prevents aV6 and aV1 from binding to the RGD sequence of latency-associated peptide (LAP), this blocks the release of activated TGF-1, thereby blocking the activation of pro-fibrotic pathways. Additionally, ZWC-58527 has been shown to reduce collagen synthesis and gene expression in human IPF tissue.   Interactions: Acts as a substrate of P-gp, OATP1B1, OATP1B3, OAT3 Inhibitors or inducers of these enzymes may affect the intestinal absorption, hepatic uptake or renal uptake of [id number]-42353 Metabolized by CYP3A4, CYP2C9, CYP2C19 Potent inhibitors or inducers of these enzymes should be avoided Each CYP enzyme metabolizes < 10% of the parent compound Since multiple CYP enzymes are involved, clinically relevant drug-drug interactions are less likely, as there are other alternative pathways A potent inhibitor of multiple pathways should be used with caution as that has the potential to elicit a clinically relevant drug-drug interaction i.e. medication that inhibits CYP3A4, CYP2C9, CYP2C19 [id number] is not an inducer of CYP1A2, CYP2B6, or CYP3A4 Showed moderate inhibition of P-gp and MATE1, substrates of P-gp and MATE1 may  have increased exposure when coadministered with [id number] Metabolized by extrahepatic enzymes: CYP1A1, UGT1A7 Clinical relevance relating to drug interactions is unknown Stable when incubated with human recombinant MAO-A and MAO-B [id number] is not an inhibitor or inducer of CYP isoforms and may be co-administered  with substrates of CYP isoforms without  risk of DDI.  Interactions with specific drugs: Potential for increased exposure of nintedanib Recommend separating time of administration of [id number]-06269 and nintedanib Drug interactions with pirfenidone are not expected Use of corticosteroids should be discussed beforehand with the medical monitor   Clinical drug interactions studies have not been performed at this time and therefore guidance is based on the in-vitro studies  Disallowed Medications/Foods/Supplements: Fluconazole Digoxin Rifampin Grapefruit Grapefruit-containing foods and beverages St. John's Wort   Pharmacokinetics: Tmax : 2-3 hours Half-life: approximately 48 hours Steady state reached in 5-7 days Minimal renal clearance (8.5% of drug is cleared by the kidneys)    Administration [phone] should be administered on an empty stomach May be administered via feeding tube   Safety:   Edition Addendum to Edition 5.0 - 16 March 2020   Animal studies: No effect on neurobehavior No effect on respiratory parameters No effects on blood pressure Showed an increase in QT and QTc The high dose 400 mg/kg showed an increase in mean heart rate   Cardiovascular effects in mice after administration of [id number]: Dose-dependent increases in blood pressure Increase in PR and QRS duration Increase in arrhythmias  Reproductive animal toxicology studies at the low and medium doses, 150 and 300 mg/kg/day, revealed no impact on: maternal survival, fetal body weight, food parameters, uterine and ovarian parameters, no external malformations  When the high dose 600 mg/kg/day was tested in rabbits, the rabbits displayed increased morbidities and increased abortions   Phase 1 studies:   Type of TEAE [id number] n=32 Placebo n=14 GI disorders 7 (22%) 0 Infections 3 (9%) 0 Musculoskeletal 1 (3%) 0 Nervous system disorders 2 (6%) 0   Most common treatment-emergent adverse  event (TEAE):  headache  The most common drug-related adverse events were: nausea, diarrhea, abdominal distension. These occurred in 12.5% of participants  There were no deaths or serious adverse events in any of the Phase I studies There were no clinically meaningful changes in vital signs, telemetry or laboratory parameters [phone] did not display any clinically significant effect on ECG (iIncluding Qtc, HR, RR, PR and QRS)  Study [id number] All TEAEs were mild and deemed unrelated to study drug One TEAE, viral infection, led to discontinuation of IP No deaths nor SAEs  As of the 25 November 2019 cutoff date, preliminary data from ongoing and discontinued Phase 2a studies suggest no new safety concerns.  All SAE's reported to date were considered unrelated to study drug by the investigator. All treatment-emergent SAE's were known manifestations of the underlying  diseases (IPF, PSC or ARDS) and considered by the investigator to be most likely due to disease progresssion.  Part D of the study will evaluate approximately 28 participants at the 320 mg dose for at least 24 weeks to further characterize the safety profile of [id number]-42353. Ongoing safety and FVC assessments will continue until the last participant reaches 24 weeks of the study; therefore, safety and FVC assessments may extend to 48 weeks in participants randomized earlier in the study.      xxxxxxxxxxxxxxxxxx  This visit for Subject MONTREAL STEIDLE with DOB: 06-17-1950 on 06/22/2020 for the above protocol is Visit/Encounter # consent  and is for purpose of research . Subject/LAR expressed continued interest and consent in continuing as a study subject. Subject thanked for participation in research and contribution to science.    Patient consented.  All study procedures done only after consent.  Exam done after consent.  Proceed per research protocol    SIGNATURE  Dr. Brand Males, M.D., F.C.C.P,  ACRP-CPI Pulmonary and Critical Care Medicine Research Investigator, [facility] Staff Physician, [place] Director - Interstitial Lung Disease  Program  [facility] [place], [place], [address]   Pager: [phone], If no answer  OR between  19:00-7:00h: page [phone] Telephone (research): [phone]  10:11 PM 06/22/2020   10:11 PM 06/22/2020

## 2020-06-26 ENCOUNTER — Encounter (HOSPITAL_COMMUNITY): Payer: Self-pay | Admitting: Physician Assistant

## 2020-06-26 ENCOUNTER — Ambulatory Visit (HOSPITAL_COMMUNITY)
Admission: RE | Admit: 2020-06-26 | Discharge: 2020-06-26 | Disposition: A | Discharge status: Home / Self Care | Payer: PPO | Source: Ambulatory Visit | Attending: Physician Assistant | Admitting: Physician Assistant

## 2020-06-26 ENCOUNTER — Other Ambulatory Visit: Payer: Self-pay

## 2020-06-26 VITALS — BP 134/82 | HR 74 | Ht 69.0 in | Wt 178.6 lb

## 2020-06-26 DIAGNOSIS — I2510 Atherosclerotic heart disease of native coronary artery without angina pectoris: Secondary | ICD-10-CM | POA: Diagnosis not present

## 2020-06-26 DIAGNOSIS — I480 Paroxysmal atrial fibrillation: Secondary | ICD-10-CM

## 2020-06-26 DIAGNOSIS — I10 Essential (primary) hypertension: Secondary | ICD-10-CM | POA: Insufficient documentation

## 2020-06-26 DIAGNOSIS — Z79899 Other long term (current) drug therapy: Secondary | ICD-10-CM | POA: Diagnosis not present

## 2020-06-26 DIAGNOSIS — Z7901 Long term (current) use of anticoagulants: Secondary | ICD-10-CM | POA: Diagnosis not present

## 2020-06-26 DIAGNOSIS — D6869 Other thrombophilia: Secondary | ICD-10-CM

## 2020-06-26 NOTE — Progress Notes (Signed)
Primary Care Physician: Lujean Amel, MD Primary Cardiologist: Dr Ellyn Hack Primary Electrophysiologist: none Referring Physician: Dr Leanne Lovely ED   Eduardo Fuentes is a 70 y.o. male with a history of ILD, HTN, CAD, and atrial fibrillation who presents for follow up in the [facility]. The patient was initially diagnosed with atrial fibrillation 05/03/20 after presenting to his PCP office for dizziness. He was sent to the ED for evaluation. He was rate controlled with diltiazem and started on Eliquis for a CHADS2VASC score of 3. Patient was back in SR at follow up with his PCP the next day. There did not appear to be any specific triggers that he could identify.   On follow up today, patient reports he has done well since his last visit. He denies any dizziness or tachypalpitations. Echo showed EF 60-65%, mod asymmetric basal septal hypertrophy, and mild aortic dilation 40 mm. He denies any bleeding issues on anticoagulation.   Today, he denies symptoms of palpitations, chest pain, shortness of breath, orthopnea, PND, lower extremity edema, dizziness, presyncope, syncope, snoring, daytime somnolence, bleeding, or neurologic sequela. The patient is tolerating medications without difficulties and is otherwise without complaint today.    Atrial Fibrillation Risk Factors:  he does not have symptoms or diagnosis of sleep apnea. he does not have a history of rheumatic fever. he does not have a history of alcohol use.   he has a BMI of Body mass index is 26.37 kg/m.Marland Kitchen Filed Weights   06/26/20 1032  Weight: 81 kg    Family History  Problem Relation Age of Onset  . Dementia Mother   . Anxiety disorder Mother   . Alzheimer's disease Mother   . Bone cancer Father        Metastatic prostate  . Cancer Father        prostate  . Heart attack Father 75       Referred for CABG  . Anxiety disorder Sister   . Colon cancer Neg Hx   . Esophageal cancer Neg  Hx   . Rectal cancer Neg Hx   . Stomach cancer Neg Hx   . Colon polyps Neg Hx      Atrial Fibrillation Management history:  Previous antiarrhythmic drugs: none Previous cardioversions: none Previous ablations: none CHADS2VASC score: 3 Anticoagulation history: Eliquis   Past Medical History:  Diagnosis Date  . Agatston CAC score, <100 04/2018  . Arthritis    NECK  . Cough    NON-PRODUCTIVE  . Depressive disorder 10/2011   follow by dr [facility] psychiatry; has anxiety and panic disorder as well.  . Diverticulosis   . Diverticulosis of colon    Followed by Dr. Henrene Pastor  . Essential hypertension   . Generalized anxiety disorder   . GERD (gastroesophageal reflux disease)    WATCHES DIET  . Hemorrhoid   . Hiatal hernia    Describes a sliding hiatal hernia  . History of colon polyps   . History of kidney stones   . Idiopathic pulmonary fibrosis ([place])   . Microscopic hematuria   . Nocturia   . Panic attacks   . Prostate cancer ([place]) 07/2012   Dr. Arnell Sieving urology.  . Sinus infection   . Tinnitus    LEFT EAR -- CHRONIC   Past Surgical History:  Procedure Laterality Date  . COLONOSCOPY  03/04/2013  . ESOPHAGOGASTRODUODENOSCOPY    . EXTRACORPOREAL SHOCK WAVE LITHOTRIPSY  YRS AGO  . PROSTATE BIOPSY    .  RADIOACTIVE SEED IMPLANT N/A 02/02/2013   Procedure: RADIOACTIVE SEED IMPLANT;  Surgeon: Molli Hazard, MD;  Location: [facility];  Service: Urology;  Laterality: N/A;  . TONSILLECTOMY AND ADENOIDECTOMY  AS CHILD  . UPPER GASTROINTESTINAL ENDOSCOPY  2019    Current Outpatient Medications  Medication Sig Dispense Refill  . apixaban (ELIQUIS) 5 MG TABS tablet Take 1 tablet (5 mg total) by mouth 2 (two) times daily. 60 tablet 3  . clonazePAM (KLONOPIN) 1 MG tablet Take 1 tablet at 8 am, 1/2 tablet at 2 pm and 1 tablet at bedtime for anxiety. 75 tablet 0  . diltiazem (CARDIZEM CD) 120 MG 24 hr capsule Take 1 capsule (120 mg total) by mouth  daily. 30 capsule 3  . gabapentin (NEURONTIN) 300 MG capsule Take 600 mg by mouth 2 (two) times daily.     . mirtazapine (REMERON) 30 MG tablet Take 30 mg by mouth at bedtime.   4  . Nintedanib (OFEV) 150 MG CAPS Take 1 capsule (150 mg total) by mouth 2 (two) times daily. 180 capsule 3  . nortriptyline (PAMELOR) 25 MG capsule Take 25 mg by mouth at bedtime.    . ondansetron (ZOFRAN) 4 MG tablet TAKE 1 TO 2 TABLETS BY MOUTH WITH OFEV 120 tablet 0  . pantoprazole (PROTONIX) 40 MG tablet TAKE 1 TABLET(40 MG) BY MOUTH DAILY 90 tablet 1  . polyethylene glycol (MIRALAX / GLYCOLAX) 17 g packet Take 17 g by mouth daily as needed for mild constipation or moderate constipation.    . psyllium (METAMUCIL) 58.6 % powder Take 1 packet by mouth daily.     Current Facility-Administered Medications  Medication Dose Route Frequency Provider Last Rate Last Admin  . 0.9 %  sodium chloride infusion  500 mL Intravenous Once Irene Shipper, MD        Allergies  Allergen Reactions  . Aleve [Naproxen Sodium] Anaphylaxis and Other (See Comments)    GI UPSET Patient does not remember anaphylaxis  . Hyzaar [Losartan Potassium-Hctz] Other (See Comments)    Felt bad   . Lisinopril Cough  . Demerol [Meperidine] Rash         Social History   Socioeconomic History  . Marital status: Married    Spouse name: [name]  . Number of children: 0  . Years of education: Not on file  . Highest education level: Not on file  Occupational History  . Occupation: retired  Tobacco Use  . Smoking status: Never Smoker  . Smokeless tobacco: Never Used  Vaping Use  . Vaping Use: Never used  Substance and Sexual Activity  . Alcohol use: No  . Drug use: No  . Sexual activity: Never  Other Topics Concern  . 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  No  caffeine.   Social Determinants of Health   Financial Resource Strain: Not on file  Food Insecurity: Not on file  Transportation Needs: Not on file  Physical Activity: Not on file  Stress: Not on file  Social Connections: Not on file  Intimate Partner Violence: Not on file     ROS- All systems are reviewed and negative except as per the HPI above.  Physical Exam: Vitals:   06/26/20 1032  BP: 134/82  Pulse: 74  Weight: 81  kg  Height: 5\' 9"  (1.753 m)    GEN- The patient is a well appearing male, alert and oriented x 3 today.  HEENT-head normocephalic, atraumatic, sclera clear, conjunctiva pink, hearing intact, trachea midline. Lungs- Clear to ausculation bilaterally, normal work of breathing Heart- Regular rate and rhythm, no murmurs, rubs or gallops  GI- soft, NT, ND, + BS Extremities- no clubbing, cyanosis, or edema MS- no significant deformity or atrophy Skin- no rash or lesion Psych- euthymic mood, full affect Neuro- strength and sensation are intact   Wt Readings from Last 3 Encounters:  06/26/20 81 kg  05/23/20 81 kg  05/16/20 81.6 kg    EKG today demonstrates SR Vent. rate 74 BPM PR interval 164 ms QRS duration 82 ms QT/QTcB 348/386 ms  Echo 06/22/20 demonstrated 1. Left ventricular ejection fraction, by estimation, is 60 to 65%. The  left ventricle has normal function. The left ventricle has no regional  wall motion abnormalities. There is moderate asymmetric left ventricular hypertrophy of the basal-septal segment. Left ventricular diastolic parameters are consistent with Grade I  diastolic dysfunction (impaired relaxation).  2. Right ventricular systolic function is mildly reduced. The right  ventricular size is normal.  3. The mitral valve is normal in structure. Trivial mitral valve  regurgitation. No evidence of mitral stenosis.  4. The aortic valve is tricuspid. Aortic valve regurgitation is not  visualized. Mild aortic valve sclerosis is  present, with no evidence of  aortic valve stenosis.  5. Aortic dilatation noted. There is mild dilatation of the aortic root,  measuring 40 mm.   Epic records are reviewed at length today  CHA2DS2-VASc Score = 3  The patient's score is based upon: CHF History: No HTN History: Yes Diabetes History: No Stroke History: No Vascular Disease History: Yes Age Score: 1 Gender Score: 0      ASSESSMENT AND PLAN: 1. Paroxysmal atrial fibrillation The patient's CHA2DS2-VASc score is 3, indicating a 3.2% annual risk of stroke.   Patient appears to be maintaining SR. Continue Eliquis 5 mg BID Continue diltiazem 120 mg daily Could consider AAD if his arrhythmia becomes more persistent.   2. Secondary Hypercoagulable State (ICD10:  D68.69) The patient is at significant risk for stroke/thromboembolism based upon his CHA2DS2-VASc Score of 3.  Continue Apixaban (Eliquis).   3. HTN Stable, no changes today.  4. CAD Noted on CT No anginal symptoms.  5. LVH Moderate asymmetric left ventricular hypertrophy of the basal-septal segment. Will have him follow up with primary cardiologist.   6. 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   Follow up with Dr Ellyn Hack in 2-3 months to reestablish care. AF clinic in 6 months.    [facility] [address] [place], [place] [address] [phone] 06/26/2020 10:38 AM

## 2020-07-12 DIAGNOSIS — N1831 Chronic kidney disease, stage 3a: Secondary | ICD-10-CM | POA: Diagnosis not present

## 2020-07-16 ENCOUNTER — Encounter: Payer: PPO | Admitting: Internal Medicine

## 2020-07-16 ENCOUNTER — Telehealth: Payer: Self-pay | Admitting: Internal Medicine

## 2020-07-16 NOTE — Telephone Encounter (Signed)
Called and spoke with patient who stated he would like to know if Dr Chase Caller recommends pt to get second covid booster shot or wait.  Patient had original Therapist, music) COVID vaccines on 04/02/2019 & 04/27/2019 and got the 1st booster shot on 12/10/2019.   Dr Chase Caller please advise as to whether patient should go ahead and get 2nd COVID booster shot or wait.

## 2020-07-16 NOTE — Telephone Encounter (Signed)
Called and spoke with pt and spouse letting them know the info stated by MR and they both verbalized understanding. Nothing further needed.

## 2020-07-16 NOTE — Telephone Encounter (Signed)
The cdc recommmends it. The evidence that is super effective against the current strain esp after having had 3 shots, is so so. It works but is transient. So, is upto him. If he is unsure - can come in and do covid IgG and if negative might make a stronger case to get the 4th shot now

## 2020-08-28 NOTE — Progress Notes (Signed)
Cardiology Office Note:    Date:  08/29/2020   ID:  Eduardo Fuentes, DOB May 16, 1950, MRN [id number]  PCP:  Lujean Amel, MD Referring MD: Lujean Amel, [facility]  Cardiologist:  Glenetta Hew, MD   Reason for visit: 2 month follow-up  History of Present Illness:    Eduardo Fuentes is a 70 y.o. male with a hx of ILD, HTN, CAD, and atrial fibrillation diagnosed 04/2020.  Patient was last seen by the A-fib clinic in 06/2020 and was noted to be in NSR.    Today, he comes alone to clinic.  He states he is doing well from a CV standpoint.  He mentions his ILD/pulmonary fibrosis (states it's mild-mod) that was caught on chest x-ray.  He is currently on Ofev.  His hope is to stay off oxygen as long as possible.  He is still able to push mow and walk on the treadmill at [employer] for 25 minutes before he will feel a little short of breath.  He denies chest pain, shortness of breath at rest, syncope, orthopnea, PND or significant pedal edema.    He mentions he was diagnosed with A-fib in March.  He was on the treadmill and noticed his HR was 170.  He felt "crummy" and lightheaded.  No palpitations.  He was admitted and was started on diltiazem.  No further "crumminess"/lightheadedness feeling since then.     Past Medical History:  Diagnosis Date   Agatston CAC score, <100 04/2018   Arthritis    NECK   Cough    NON-PRODUCTIVE   Depressive disorder 10/2011   follow by dr [facility] psychiat[facility]; has anxiety and panic disorder as well.   Diverticulosis    Diverticulosis of colon    Followed by Dr. Henrene Pastor   Essential hypertension    Generalized anxiety disorder    GERD (gastroesophageal reflux disease)    WATCHES DIET   Hemorrhoid    Hiatal hernia    Describes a sliding hiatal hernia   History of colon polyps    History of kidney stones    Idiopathic pulmonary fibrosis ([place])    Microscopic hematuria    Nocturia    Panic attacks    Prostate cancer ([place])  07/2012   Dr. Arnell Sieving urology.   Sinus infection    Tinnitus    LEFT EAR -- CHRONIC    Past Surgical History:  Procedure Laterality Date   COLONOSCOPY  03/04/2013   ESOPHAGOGASTRODUODENOSCOPY     EXTRACORPOREAL SHOCK WAVE LITHOTRIPSY  YRS AGO   PROSTATE BIOPSY     RADIOACTIVE SEED IMPLANT N/A 02/02/2013   Procedure: RADIOACTIVE SEED IMPLANT;  Surgeon: Molli Hazard, MD;  Location[facility];  Service: Urology;  Laterality: N/A;   TONSILLECTOMY AND ADENOIDECTOMY  AS CHILD   UPPER GASTROINTESTINAL ENDOSCOPY  2019    Current Medications: Current Meds  Medication Sig   clonazePAM (KLONOPIN) 1 MG tablet Take 1 tablet at 8 am, 1/2 tablet at 2 pm and 1 tablet at bedtime for anxiety.   diltiazem (CARDIZEM CD) 120 MG 24 hr capsule Take 1 capsule (120 mg total) by mouth daily.   gabapentin (NEURONTIN) 300 MG capsule Take 600 mg by mouth 2 (two) times daily.    mirtazapine (REMERON) 30 MG tablet Take 30 mg by mouth at bedtime.    Nintedanib (OFEV) 150 MG CAPS Take 1 capsule (150 mg total) by mouth 2 (two) times daily.  nortriptyline (PAMELOR) 25 MG capsule Take 25 mg by mouth at bedtime.   ondansetron (ZOFRAN) 4 MG tablet TAKE 1 TO 2 TABLETS BY MOUTH WITH OFEV   pantoprazole (PROTONIX) 40 MG tablet TAKE 1 TABLET(40 MG) BY MOUTH DAILY   polyethylene glycol (MIRALAX / GLYCOLAX) 17 g packet Take 17 g by mouth daily as needed for mild constipation or moderate constipation.   psyllium (METAMUCIL) 58.6 % powder Take 1 packet by mouth daily.   rosuvastatin (CRESTOR) 20 MG tablet Take 1 tablet (20 mg total) by mouth daily.   Current Facility-Administered Medications for the 08/29/20 encounter (Office Visit) with Warren Lacy, PA-C  Medication   0.9 %  sodium chloride infusion     Allergies:   Aleve [naproxen sodium], Hyzaar [losartan potassium-hctz], Lisinopril, and Demerol [meperidine]   Social History   Socioeconomic History   Marital status:  Married    Spouse name: [name]   Number of children: 0   Years of education: Not on file   Highest education level: Not on file  Occupational History   Occupation: retired  Tobacco Use   Smoking status: Never   Smokeless tobacco: Never  Vaping Use   Vaping Use: Never used  Substance and Sexual Activity   Alcohol use: No   Drug use: No   Sexual activity: Never  Other Topics Concern   Not on file  Social History Narrative   Pt lives in single story home with is wife   12th grade education   Retired Programmer, systems from [employer] ;   Maybe 2-3 alcoholic beverages a month.  No caffeine.   Social Determinants of Health   Financial Resource Strain: Not on file  Food Insecurity: Not on file  Transportation Needs: Not on file  Physical Activity: Not on file  Stress: Not on file  Social Connections: Not on file     Family History: The patient's family history includes Alzheimer's disease in his mother; Anxiety disorder in his mother and sister; Bone cancer in his father; Cancer in his father; Dementia in his mother; Heart attack (age of onset: 53) in his father. There is no history of Colon cancer, Esophageal cancer, Rectal cancer, Stomach cancer, or Colon polyps.  ROS:   Please see the history of present illness.     EKGs/Labs/Other Studies Reviewed:    EKG:  The ekg ordered today demonstrates NSR with PVCs, HR 66, QRS 78ms, QT 360.  Recent Labs: 05/03/2020: BUN 22; Creatinine, Ser 1.50; Potassium 4.2; Sodium 137 06/12/2020: ALT 17; Hemoglobin 15.7; Platelets 283.0  Recent Lipid Panel    Component Value Date/Time   CHOL 162 10/29/2011 0655   TRIG 94 10/29/2011 0655   HDL 42 10/29/2011 0655   CHOLHDL 3.9 10/29/2011 0655   VLDL 19 10/29/2011 0655   LDLCALC 101 (H) 10/29/2011 0655    Physical Exam:    VS:  BP 120/70 (BP Location: Left Arm)   Pulse 77   Ht 5\' 9"  (1.753 m)   Wt 177 lb 3.2 oz (80.4 kg)   SpO2 93%   BMI 26.17 kg/m     Wt  Readings from Last 3 Encounters:  08/29/20 177 lb 3.2 oz (80.4 kg)  06/26/20 178 lb 9.6 oz (81 kg)  05/23/20 178 lb 9.6 oz (81 kg)     GEN:  Well nourished, well developed in no acute distress HEENT: Normal NECK: No JVD; No carotid bruits CARDIAC: RRR with ectopics, no murmurs, rubs, gallops RESPIRATORY:  Clear to auscultation without rales, wheezing or rhonchi  ABDOMEN: Soft, non-tender, non-distended MUSCULOSKELETAL: No edema; No deformity  SKIN: Warm and dry NEUROLOGIC:  Alert and oriented PSYCHIATRIC:  Normal affect   ASSESSMENT AND PLAN   1. Paroxysmal atrial fibrillation (HCC) - EKG shows NSR with PVCs - Continue Eliquis with CHADSVASC of 3.  Pt is concerned about his medication donut hole that may be coming up soon - given 2 week sample & offered to fill out patient assistance form when he is in the donut hole.   - Continue diltiazem for rate control.  2. Hyperlipidemia/ Coronary artery calcification seen on CAT scan - Three-vessel coronary atherosclerosis on CT chest 05/2020.  No angina, no coronary evaluation warranted. - LDL 106 in 11/2019 - Recommend starting Crestor 20mg  daily.  Check lipids in 2 months.   - No aspirin given already on Eliquis  3. Aortic root dilation (HCC) - Aortic root measured 48mm on echo 05/2020 - Consider repeat echo in 2 years.  Disposition: Follow-up as scheduled with Dr. Ellyn Hack in 2 months.  F/U lipids in 2 months with Dr. Ellyn Hack or at PCP annual physical.        Medication Adjustments/Labs and Tests Ordered: Current medicines are reviewed at length with the patient today.  Concerns regarding medicines are outlined above.  Orders Placed This Encounter  Procedures   Lipid panel   EKG 12-Lead   Meds ordered this encounter  Medications   rosuvastatin (CRESTOR) 20 MG tablet    Sig: Take 1 tablet (20 mg total) by mouth daily.    Dispense:  90 tablet    Refill:  3    Patient Instructions  Medication Instructions:  Start Crestor 20  mg ( Take 1 tablet Daily) *If you need a refill on your cardiac medications before your next appointment, please call your pharmacy*   Lab Work: Lipid Panel ( To Be Done In 2 Months) If you have labs (blood work) drawn today and your tests are completely normal, you will receive your results only by: [place] (if you have MyChart) OR A paper copy in the mail If you have any lab test that is abnormal or we need to change your treatment, we will call you to review the results.   Testing/Procedures: No Testing   Follow-Up: At [facility], you and your health needs are our priority.  As part of our continuing mission to provide you with exceptional heart care, we have created designated Provider Care Teams.  These Care Teams include your primary Cardiologist (physician) and Advanced Practice Providers (APPs -  Physician Assistants and Nurse Practitioners) who all work together to provide you with the care you need, when you need it.  We recommend signing up for the patient portal called "MyChart".  Sign up information is provided on this After Visit Summary.  MyChart is used to connect with patients for Virtual Visits (Telemedicine).  Patients are able to view lab/test results, encounter notes, upcoming appointments, etc.  Non-urgent messages can be sent to your provider as well.   To learn more about what you can do with MyChart, go to [email or web address].    Your next appointment:   October 31, 2020 1:40 PM  The format for your next appointment:   In Person  Provider:   Glenetta Hew, MD       Signed, Warren Lacy, PA-C  08/29/2020 11:49 AM    [place]

## 2020-08-29 ENCOUNTER — Other Ambulatory Visit: Payer: Self-pay

## 2020-08-29 ENCOUNTER — Encounter: Payer: Self-pay | Admitting: Medical

## 2020-08-29 ENCOUNTER — Ambulatory Visit: Payer: PPO | Admitting: Physician Assistant

## 2020-08-29 VITALS — BP 120/70 | HR 77 | Ht 69.0 in | Wt 177.2 lb

## 2020-08-29 DIAGNOSIS — I2510 Atherosclerotic heart disease of native coronary artery without angina pectoris: Secondary | ICD-10-CM | POA: Diagnosis not present

## 2020-08-29 DIAGNOSIS — I480 Paroxysmal atrial fibrillation: Secondary | ICD-10-CM | POA: Diagnosis not present

## 2020-08-29 DIAGNOSIS — I77810 Thoracic aortic ectasia: Secondary | ICD-10-CM

## 2020-08-29 DIAGNOSIS — E785 Hyperlipidemia, unspecified: Secondary | ICD-10-CM | POA: Diagnosis not present

## 2020-08-29 DIAGNOSIS — E782 Mixed hyperlipidemia: Secondary | ICD-10-CM

## 2020-08-29 MED ORDER — ROSUVASTATIN CALCIUM 20 MG PO TABS: 20.0000 mg | ORAL_TABLET | Freq: Every day | ORAL | 3 refills | Status: AC

## 2020-08-29 NOTE — Patient Instructions (Signed)
Medication Instructions:  Start Crestor 20 mg ( Take 1 tablet Daily) *If you need a refill on your cardiac medications before your next appointment, please call your pharmacy*   Lab Work: Lipid Panel ( To Be Done In 2 Months) If you have labs (blood work) drawn today and your tests are completely normal, you will receive your results only by: [place] (if you have MyChart) OR A paper copy in the mail If you have any lab test that is abnormal or we need to change your treatment, we will call you to review the results.   Testing/Procedures: No Testing   Follow-Up: At [facility], you and your health needs are our priority.  As part of our continuing mission to provide you with exceptional heart care, we have created designated Provider Care Teams.  These Care Teams include your primary Cardiologist (physician) and Advanced Practice Providers (APPs -  Physician Assistants and Nurse Practitioners) who all work together to provide you with the care you need, when you need it.  We recommend signing up for the patient portal called "MyChart".  Sign up information is provided on this After Visit Summary.  MyChart is used to connect with patients for Virtual Visits (Telemedicine).  Patients are able to view lab/test results, encounter notes, upcoming appointments, etc.  Non-urgent messages can be sent to your provider as well.   To learn more about what you can do with MyChart, go to [email or web address].    Your next appointment:   October 31, 2020 1:40 PM  The format for your next appointment:   In Person  Provider:   Glenetta Hew, MD

## 2020-09-03 ENCOUNTER — Telehealth: Payer: Self-pay | Admitting: *Deleted

## 2020-09-03 NOTE — Telephone Encounter (Signed)
Patient is calling and wants to know if soaking in epsom salts and warm water will help with his possible gout flare up, is having soreness, swelling,redness underneath and around the toes. He has an scheduled appointment on 09/06/20.  Returned call to patient and explained that soaking feet in epsom salts can possible help with the soreness.

## 2020-09-05 ENCOUNTER — Other Ambulatory Visit: Payer: Self-pay | Admitting: Internal Medicine

## 2020-09-06 ENCOUNTER — Other Ambulatory Visit: Payer: Self-pay

## 2020-09-06 ENCOUNTER — Ambulatory Visit: Payer: PPO | Admitting: Podiatry

## 2020-09-06 DIAGNOSIS — M779 Enthesopathy, unspecified: Secondary | ICD-10-CM | POA: Diagnosis not present

## 2020-09-06 DIAGNOSIS — M109 Gout, unspecified: Secondary | ICD-10-CM

## 2020-09-06 MED ORDER — TRIAMCINOLONE ACETONIDE 10 MG/ML IJ SUSP
10.0000 mg | Freq: Once | INTRAMUSCULAR | Status: AC
  Administered 2020-09-06: 10 mg

## 2020-09-06 NOTE — Patient Instructions (Signed)
Gout Gout is painful swelling of your joints. Gout is a type of arthritis. It is caused by having too much uric acid in your body. Uric acid is a chemical that is made when your body breaks down substances called purines. If your body has too much uric acid, sharp crystals can form and build up in your joints. This causes pain and swelling. Gout attacks can happen quickly and be very painful (acute gout). Over time, the attacks can affect more joints and happen more often (chronic gout). What are the causes? Too much uric acid in your blood. This can happen because: Your kidneys do not remove enough uric acid from your blood. Your body makes too much uric acid. You eat too many foods that are high in purines. These foods include organ meats, some seafood, and beer. Trauma or stress. What increases the risk? Having a family history of gout. Being male and middle-aged. Being male and having gone through menopause. Being very overweight (obese). Drinking alcohol, especially beer. Not having enough water in the body (being dehydrated). Losing weight too quickly. Having an organ transplant. Having lead poisoning. Taking certain medicines. Having kidney disease. Having a skin condition called psoriasis. What are the signs or symptoms? An attack of acute gout usually happens in just one joint. The most common place is the big toe. Attacks often start at night. Other joints that may be affected include joints of the feet, ankle, knee, fingers, wrist, or elbow. Symptoms of an attack may include: Very bad pain. Warmth. Swelling. Stiffness. Shiny, red, or purple skin. Tenderness. The affected joint may be very painful to touch. Chills and fever. Chronic gout may cause symptoms more often. More joints may be involved. You may also have white or yellow lumps (tophi) on your hands or feet or in other areas near your joints. How is this treated? Treatment for this condition has two phases:  treating an acute attack and preventing future attacks. Acute gout treatment may include: NSAIDs. Steroids. These are taken by mouth or injected into a joint. Colchicine. This medicine relieves pain and swelling. It can be given by mouth or through an IV tube. Preventive treatment may include: Taking small doses of NSAIDs or colchicine daily. Using a medicine that reduces uric acid levels in your blood. Making changes to your diet. You may need to see a food expert (dietitian) about what to eat and drink to prevent gout. Follow these instructions at home: During a gout attack  If told, put ice on the painful area: Put ice in a plastic bag. Place a towel between your skin and the bag. Leave the ice on for 20 minutes, 2-3 times a day. Raise (elevate) the painful joint above the level of your heart as often as you can. Rest the joint as much as possible. If the joint is in your leg, you may be given crutches. Follow instructions from your doctor about what you cannot eat or drink. Avoiding future gout attacks Eat a low-purine diet. Avoid foods and drinks such as: Liver. Kidney. Anchovies. Asparagus. Herring. Mushrooms. Mussels. Beer. Stay at a healthy weight. If you want to lose weight, talk with your doctor. Do not lose weight too fast. Start or continue an exercise plan as told by your doctor. Eating and drinking Drink enough fluids to keep your pee (urine) pale yellow. If you drink alcohol: Limit how much you use to: 0-1 drink a day for women. 0-2 drinks a day for men. Be aware of   how much alcohol is in your drink. In the [place] one drink equals one 12 oz bottle of beer (355 mL), one 5 oz glass of wine (148 mL), or one 1 oz glass of hard liquor (44 mL). General instructions Take over-the-counter and prescription medicines only as told by your doctor. Do not drive or use heavy machinery while taking prescription pain medicine. Return to your normal activities as told by your  doctor. Ask your doctor what activities are safe for you. Keep all follow-up visits as told by your doctor. This is important. Contact a doctor if: You have another gout attack. You still have symptoms of a gout attack after 10 days of treatment. You have problems (side effects) because of your medicines. You have chills or a fever. You have burning pain when you pee (urinate). You have pain in your lower back or belly. Get help right away if: You have very bad pain. Your pain cannot be controlled. You cannot pee. Summary Gout is painful swelling of the joints. The most common site of pain is the big toe, but it can affect other joints. Medicines and avoiding some foods can help to prevent and treat gout attacks. This information is not intended to replace advice given to you by your health care provider. Make sure you discuss any questions you have with your health care provider. Document Revised: 09/02/2017 Document Reviewed: 09/02/2017 Elsevier Patient Education  2022 Elsevier Inc.  

## 2020-09-09 NOTE — Progress Notes (Signed)
Subjective:   Patient ID: Eduardo Fuentes, male   DOB: 70 y.o.   MRN: [id number]   HPI Patient presents stating that he has had a another big flareup of his right big toe joint and is interested in diet and foods that he should prevent and not use because of these flareups this is the second 1 he is having   ROS      Objective:  Physical Exam  Neurovascular status intact with inflammation fluid around the first MPJ right foot that is been present for around 4 days with redness around the area localized.  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  He like to avoid that we will try to avoid that currently

## 2020-09-11 DIAGNOSIS — F3342 Major depressive disorder, recurrent, in full remission: Secondary | ICD-10-CM | POA: Diagnosis not present

## 2020-09-11 DIAGNOSIS — F410 Panic disorder [episodic paroxysmal anxiety] without agoraphobia: Secondary | ICD-10-CM | POA: Diagnosis not present

## 2020-09-18 ENCOUNTER — Telehealth: Payer: Self-pay | Admitting: Internal Medicine

## 2020-09-18 NOTE — Telephone Encounter (Signed)
Patient called states he is having issues with bowel movements and having to go every few minutes multiple times a day please advise.

## 2020-09-19 NOTE — Telephone Encounter (Signed)
Pt reports he has been having diarrhea that will come on fairly quickly after he eats and there is some urgency. He has not tried any Imodium. Pt is scheduled to see Dr. Henrene Pastor but would like to be seen sooner. Pt scheduled to see Tye Savoy NP 09/27/20'@1'$ :30pm. Pt is aware of appt and may try imodium otc in the meantime.

## 2020-09-28 ENCOUNTER — Other Ambulatory Visit (HOSPITAL_COMMUNITY): Payer: Self-pay | Admitting: Physician Assistant

## 2020-10-01 ENCOUNTER — Ambulatory Visit (HOSPITAL_COMMUNITY)
Admission: RE | Admit: 2020-10-01 | Discharge: 2020-10-01 | Disposition: A | Discharge status: Home / Self Care | Payer: PPO | Source: Ambulatory Visit | Attending: Physician Assistant | Admitting: Physician Assistant

## 2020-10-01 ENCOUNTER — Other Ambulatory Visit: Payer: Self-pay

## 2020-10-01 ENCOUNTER — Encounter (HOSPITAL_COMMUNITY): Payer: Self-pay | Admitting: Physician Assistant

## 2020-10-01 VITALS — BP 130/80 | HR 54 | Ht 69.0 in | Wt 178.4 lb

## 2020-10-01 DIAGNOSIS — Z888 Allergy status to other drugs, medicaments and biological substances status: Secondary | ICD-10-CM | POA: Diagnosis not present

## 2020-10-01 DIAGNOSIS — I10 Essential (primary) hypertension: Secondary | ICD-10-CM | POA: Diagnosis not present

## 2020-10-01 DIAGNOSIS — Z8249 Family history of ischemic heart disease and other diseases of the circulatory system: Secondary | ICD-10-CM | POA: Insufficient documentation

## 2020-10-01 DIAGNOSIS — Z7901 Long term (current) use of anticoagulants: Secondary | ICD-10-CM | POA: Insufficient documentation

## 2020-10-01 DIAGNOSIS — I480 Paroxysmal atrial fibrillation: Secondary | ICD-10-CM | POA: Diagnosis not present

## 2020-10-01 DIAGNOSIS — Z79899 Other long term (current) drug therapy: Secondary | ICD-10-CM | POA: Diagnosis not present

## 2020-10-01 DIAGNOSIS — D6869 Other thrombophilia: Secondary | ICD-10-CM | POA: Diagnosis not present

## 2020-10-01 DIAGNOSIS — Z886 Allergy status to analgesic agent status: Secondary | ICD-10-CM | POA: Diagnosis not present

## 2020-10-01 DIAGNOSIS — I2510 Atherosclerotic heart disease of native coronary artery without angina pectoris: Secondary | ICD-10-CM | POA: Diagnosis not present

## 2020-10-01 DIAGNOSIS — Z8709 Personal history of other diseases of the respiratory system: Secondary | ICD-10-CM | POA: Insufficient documentation

## 2020-10-01 MED ORDER — APIXABAN 5 MG PO TABS
5.0000 mg | ORAL_TABLET | Freq: Two times a day (BID) | ORAL | 11 refills | Status: DC
Start: 2020-10-01 — End: 2020-10-31

## 2020-10-01 NOTE — Progress Notes (Signed)
Primary Care Physician: Lujean Amel, MD Primary Cardiologist: Dr Ellyn Hack Primary Electrophysiologist: none Referring Physician: Dr Leanne Lovely ED   Eduardo Fuentes is a 70 y.o. male with a history of ILD, HTN, CAD, and atrial fibrillation who presents for follow up in the [facility]. The patient was initially diagnosed with atrial fibrillation 05/03/20 after presenting to his PCP office for dizziness. He was sent to the ED for evaluation. He was rate controlled with diltiazem and started on Eliquis for a CHADS2VASC score of 3. Patient was back in SR at follow up with his PCP the next day. There did not appear to be any specific triggers that he could identify.   On follow up today, patient reports that 2-3 days last week he felt more tired and SOB. He was concerned that he was back in afib. ECG today shows SR. He did start to feel better over the weekend. There were no triggers that he could identify.   Today, he denies symptoms of palpitations, chest pain, orthopnea, PND, lower extremity edema, dizziness, presyncope, syncope, snoring, daytime somnolence, bleeding, or neurologic sequela. The patient is tolerating medications without difficulties and is otherwise without complaint today.    Atrial Fibrillation Risk Factors:  he does not have symptoms or diagnosis of sleep apnea. he does not have a history of rheumatic fever. he does not have a history of alcohol use.   he has a BMI of Body mass index is 26.35 kg/m.Marland Kitchen Filed Weights   10/01/20 1005  Weight: 80.9 kg    Family History  Problem Relation Age of Onset   Dementia Mother    Anxiety disorder Mother    Alzheimer's disease Mother    Bone cancer Father        Metastatic prostate   Cancer Father        prostate   Heart attack Father 75       Referred for CABG   Anxiety disorder Sister    Colon cancer Neg Hx    Esophageal cancer Neg Hx    Rectal cancer Neg Hx    Stomach cancer Neg  Hx    Colon polyps Neg Hx      Atrial Fibrillation Management history:  Previous antiarrhythmic drugs: none Previous cardioversions: none Previous ablations: none CHADS2VASC score: 3 Anticoagulation history: Eliquis   Past Medical History:  Diagnosis Date   Agatston CAC score, <100 04/2018   Arthritis    NECK   Cough    NON-PRODUCTIVE   Depressive disorder 10/2011   follow by dr [facility] psychiatry; has anxiety and panic disorder as well.   Diverticulosis    Diverticulosis of colon    Followed by Dr. Henrene Pastor   Essential hypertension    Generalized anxiety disorder    GERD (gastroesophageal reflux disease)    WATCHES DIET   Hemorrhoid    Hiatal hernia    Describes a sliding hiatal hernia   History of colon polyps    History of kidney stones    Idiopathic pulmonary fibrosis ([place])    Microscopic hematuria    Nocturia    Panic attacks    Prostate cancer ([place]) 07/2012   Dr. Arnell Sieving urology.   Sinus infection    Tinnitus    LEFT EAR -- CHRONIC   Past Surgical History:  Procedure Laterality Date   COLONOSCOPY  03/04/2013   ESOPHAGOGASTRODUODENOSCOPY     EXTRACORPOREAL SHOCK WAVE LITHOTRIPSY  YRS AGO   PROSTATE BIOPSY  RADIOACTIVE SEED IMPLANT N/A 02/02/2013   Procedure: RADIOACTIVE SEED IMPLANT;  Surgeon: Molli Hazard, MD;  Location: [facility];  Service: Urology;  Laterality: N/A;   TONSILLECTOMY AND ADENOIDECTOMY  AS CHILD   UPPER GASTROINTESTINAL ENDOSCOPY  2019    Current Outpatient Medications  Medication Sig Dispense Refill   clonazePAM (KLONOPIN) 1 MG tablet Take 1 tablet at 8 am, 1/2 tablet at 2 pm and 1 tablet at bedtime for anxiety. 75 tablet 0   diltiazem (CARDIZEM CD) 120 MG 24 hr capsule TAKE 1 CAPSULE(120 MG) BY MOUTH DAILY 30 capsule 4   gabapentin (NEURONTIN) 300 MG capsule Take 600 mg by mouth 2 (two) times daily.      mirtazapine (REMERON) 30 MG tablet Take 30 mg by mouth at bedtime.   4   Nintedanib (OFEV) 150  MG CAPS Take 1 capsule (150 mg total) by mouth 2 (two) times daily. 180 capsule 3   ondansetron (ZOFRAN) 4 MG tablet TAKE 1 TO 2 TABLETS BY MOUTH WITH OFEV 120 tablet 0   pantoprazole (PROTONIX) 40 MG tablet TAKE 1 TABLET(40 MG) BY MOUTH DAILY 90 tablet 1   polyethylene glycol (MIRALAX / GLYCOLAX) 17 g packet Take 17 g by mouth daily as needed for mild constipation or moderate constipation.     psyllium (METAMUCIL) 58.6 % powder Take 1 packet by mouth daily.     rosuvastatin (CRESTOR) 20 MG tablet Take 1 tablet (20 mg total) by mouth daily. 90 tablet 3   apixaban (ELIQUIS) 5 MG TABS tablet Take 1 tablet (5 mg total) by mouth 2 (two) times daily. 60 tablet 11   nortriptyline (PAMELOR) 25 MG capsule Take 25 mg by mouth at bedtime. (Patient not taking: Reported on 10/01/2020)     Current Facility-Administered Medications  Medication Dose Route Frequency Provider Last Rate Last Admin   0.9 %  sodium chloride infusion  500 mL Intravenous Once Irene Shipper, MD        Allergies  Allergen Reactions   Aleve [Naproxen Sodium] Anaphylaxis and Other (See Comments)    GI UPSET Patient does not remember anaphylaxis   Hyzaar [Losartan Potassium-Hctz] Other (See Comments)    Felt bad    Lisinopril Cough   Demerol [Meperidine] Rash         Social History   Socioeconomic History   Marital status: Married    Spouse name: [name]   Number of children: 0   Years of education: Not on file   Highest education level: Not on file  Occupational History   Occupation: retired  Tobacco Use   Smoking status: Never   Smokeless tobacco: Never  Vaping Use   Vaping Use: Never used  Substance and Sexual Activity   Alcohol use: No   Drug use: No   Sexual activity: Never  Other Topics Concern   Not on file  Social History Narrative   Pt lives in single story home with is wife   12th grade education   Retired Programmer, systems from [employer] ;   Maybe 2-3 alcoholic beverages a month.   No caffeine.   Social Determinants of Health   Financial Resource Strain: Not on file  Food Insecurity: Not on file  Transportation Needs: Not on file  Physical Activity: Not on file  Stress: Not on file  Social Connections: Not on file  Intimate Partner Violence: Not on file     ROS- All systems are reviewed and negative except as  per the HPI above.  Physical Exam: Vitals:   10/01/20 1005  BP: 130/80  Pulse: (!) 54  Weight: 80.9 kg  Height: '5\' 9"'$  (1.753 m)    GEN- The patient is a well appearing male, alert and oriented x 3 today.   HEENT-head normocephalic, atraumatic, sclera clear, conjunctiva pink, hearing intact, trachea midline. Lungs- Clear to ausculation bilaterally, normal work of breathing Heart- Regular rate and rhythm, bradycardia, no murmurs, rubs or gallops  GI- soft, NT, ND, + BS Extremities- no clubbing, cyanosis, or edema MS- no significant deformity or atrophy Skin- no rash or lesion Psych- euthymic mood, full affect Neuro- strength and sensation are intact   Wt Readings from Last 3 Encounters:  10/01/20 80.9 kg  08/29/20 80.4 kg  06/26/20 81 kg    EKG today demonstrates SB Vent. rate 54 BPM PR interval 114 ms QRS duration 84 ms QT/QTcB 400/379 ms  Echo 06/22/20 demonstrated 1. Left ventricular ejection fraction, by estimation, is 60 to 65%. The  left ventricle has normal function. The left ventricle has no regional  wall motion abnormalities. There is moderate asymmetric left ventricular hypertrophy of the basal-septal segment. Left ventricular diastolic parameters are consistent with Grade I  diastolic dysfunction (impaired relaxation).   2. Right ventricular systolic function is mildly reduced. The right  ventricular size is normal.   3. The mitral valve is normal in structure. Trivial mitral valve  regurgitation. No evidence of mitral stenosis.   4. The aortic valve is tricuspid. Aortic valve regurgitation is not  visualized. Mild  aortic valve sclerosis is present, with no evidence of  aortic valve stenosis.   5. Aortic dilatation noted. There is mild dilatation of the aortic root,  measuring 40 mm.   Epic records are reviewed at length today  CHA2DS2-VASc Score = 3  The patient's score is based upon: CHF History: No HTN History: Yes Diabetes History: No Stroke History: No Vascular Disease History: Yes Age Score: 1 Gender Score: 0      ASSESSMENT AND PLAN: 1. Paroxysmal atrial fibrillation The patient's CHA2DS2-VASc score is 3, indicating a 3.2% annual risk of stroke.   Patient in [place]. Can consider AAD if his symptoms become more persistent. Unclear if his tiredness/SOB was related to afib vs ILD. Continue Eliquis 5 mg BID Continue diltiazem 120 mg daily  2. Secondary Hypercoagulable State (ICD10:  D68.69) The patient is at significant risk for stroke/thromboembolism based upon his CHA2DS2-VASc Score of 3.  Continue Apixaban (Eliquis).   3. HTN Stable, no changes today.  4. CAD Noted on CT No anginal symptoms.   Follow up with Dr Ellyn Hack as scheduled. AF clinic in 6 months.    [facility] [address] [place], [place] [address] [phone] 10/01/2020 9:09 PM

## 2020-10-09 DIAGNOSIS — C61 Malignant neoplasm of prostate: Secondary | ICD-10-CM | POA: Diagnosis not present

## 2020-10-11 DIAGNOSIS — M5416 Radiculopathy, lumbar region: Secondary | ICD-10-CM | POA: Diagnosis not present

## 2020-10-16 DIAGNOSIS — C61 Malignant neoplasm of prostate: Secondary | ICD-10-CM | POA: Diagnosis not present

## 2020-10-16 DIAGNOSIS — N5082 Scrotal pain: Secondary | ICD-10-CM | POA: Diagnosis not present

## 2020-10-24 ENCOUNTER — Encounter: Payer: Self-pay | Admitting: Internal Medicine

## 2020-10-24 ENCOUNTER — Ambulatory Visit (INDEPENDENT_AMBULATORY_CARE_PROVIDER_SITE_OTHER): Payer: PPO | Admitting: Internal Medicine

## 2020-10-24 VITALS — BP 124/72 | HR 66 | Ht 69.0 in | Wt 176.0 lb

## 2020-10-24 DIAGNOSIS — K219 Gastro-esophageal reflux disease without esophagitis: Secondary | ICD-10-CM | POA: Diagnosis not present

## 2020-10-24 DIAGNOSIS — R197 Diarrhea, unspecified: Secondary | ICD-10-CM

## 2020-10-24 DIAGNOSIS — K5900 Constipation, unspecified: Secondary | ICD-10-CM

## 2020-10-24 DIAGNOSIS — R194 Change in bowel habit: Secondary | ICD-10-CM | POA: Diagnosis not present

## 2020-10-24 DIAGNOSIS — R110 Nausea: Secondary | ICD-10-CM

## 2020-10-24 NOTE — Patient Instructions (Signed)
If you are age 71 or older, your body mass index should be between 23-30. Your Body mass index is 25.99 kg/m. If this is out of the aforementioned range listed, please consider follow up with your Primary Care Provider.  If you are age 50 or younger, your body mass index should be between 19-25. Your Body mass index is 25.99 kg/m. If this is out of the aformentioned range listed, please consider follow up with your Primary Care Provider.   __________________________________________________________  The [facility] providers would like to encourage you to use [facility] to communicate with providers for non-urgent requests or questions.  Due to long hold times on the telephone, sending your provider a message by [facility] may be a faster and more efficient way to get a response.  Please allow 48 business hours for a response.  Please remember that this is for non-urgent requests.   Take 2 heaping tablespoons of Metamucil a day.  Take 1 over the counter Colace at bedtime.  Please follow up as needed

## 2020-10-24 NOTE — Progress Notes (Signed)
HISTORY OF PRESENT ILLNESS:  Eduardo Fuentes is a 70 y.o. male with past medical history as listed below who presents today for follow-up regarding problems with constipation alternating with diarrhea.  He was last seen in this office March 05, 2018 regarding alternating bowel habits, chronic nausea, and GERD.  See that dictation.  He presents today for follow-up.  He is accompanied by his wife.  He tells me that he typically goes 3 or 4 days without a bowel movement and will take a dose of MiraLAX.  Thereafter he will have explosive bowel movement which is somewhat loose.  This pattern alternates.  He has been using about a teaspoon of Metamucil daily.  He will also use Imodium for loose stools.  He tells me that he takes MiraLAX 4 or 5 times per month.  Imodium about 2 times per month.  He continues on Ofev.  He did undergo colonoscopy and upper endoscopy May 2021.  Upper endoscopy was normal.  Colonoscopy revealed sigmoid diverticulosis, radiation proctitis.  The exam was otherwise normal including the ileum.  Random colon biopsies were normal.  He is otherwise doing well.  His lung disease is stable.  He is to have follow-up pulmonary function tests shortly.  CBC from April 2022 shows hemoglobin 15.7.  Normal liver test.  REVIEW OF SYSTEMS:  All non-GI ROS negative unless otherwise stated in the HPI.  Past Medical History:  Diagnosis Date   Agatston CAC score, <100 04/2018   Arthritis    NECK   Cough    NON-PRODUCTIVE   Depressive disorder 10/2011   follow by dr [facility] psychiatry; has anxiety and panic disorder as well.   Diverticulosis    Diverticulosis of colon    Followed by Dr. Henrene Pastor   Essential hypertension    Generalized anxiety disorder    GERD (gastroesophageal reflux disease)    WATCHES DIET   Hemorrhoid    Hiatal hernia    Describes a sliding hiatal hernia   History of colon polyps    History of kidney stones    Idiopathic pulmonary fibrosis ([place])    Microscopic hematuria     Nocturia    Panic attacks    Prostate cancer ([place]) 07/2012   Dr. Arnell Sieving urology.   Sinus infection    Tinnitus    LEFT EAR -- CHRONIC    Past Surgical History:  Procedure Laterality Date   COLONOSCOPY  03/04/2013   ESOPHAGOGASTRODUODENOSCOPY     EXTRACORPOREAL SHOCK WAVE LITHOTRIPSY  YRS AGO   PROSTATE BIOPSY     RADIOACTIVE SEED IMPLANT N/A 02/02/2013   Procedure: RADIOACTIVE SEED IMPLANT;  Surgeon: Molli Hazard, MD;  Location: [facility];  Service: Urology;  Laterality: N/A;   TONSILLECTOMY AND ADENOIDECTOMY  AS CHILD   UPPER GASTROINTESTINAL ENDOSCOPY  2019    Social History Eduardo Fuentes  reports that he has never smoked. He has never used smokeless tobacco. He reports that he does not drink alcohol and does not use drugs.  family history includes Alzheimer's disease in his mother; Anxiety disorder in his mother and sister; Bone cancer in his father; Cancer in his father; Dementia in his mother; Heart attack (age of onset: 71) in his father.  Allergies  Allergen Reactions   Aleve [Naproxen Sodium] Anaphylaxis and Other (See Comments)    GI UPSET Patient does not remember anaphylaxis   Hyzaar [Losartan Potassium-Hctz] Other (See Comments)    Felt bad    Lisinopril Cough  Demerol [Meperidine] Rash            PHYSICAL EXAMINATION: Vital signs: BP 124/72   Pulse 66   Ht '5\' 9"'$  (1.753 m)   Wt 176 lb (79.8 kg)   BMI 25.99 kg/m   Constitutional: generally well-appearing, no acute distress Psychiatric: alert and oriented x3, cooperative Eyes: extraocular movements intact, anicteric, conjunctiva pink Mouth: oral pharynx moist, no lesions Neck: supple no lymphadenopathy Cardiovascular: heart regular rate and rhythm, no murmur Lungs: clear to auscultation bilaterally Abdomen: soft, nontender, nondistended, no obvious ascites, no peritoneal signs, normal bowel sounds, no organomegaly Rectal: Omitted Extremities: no clubbing,  cyanosis, or lower extremity edema bilaterally Skin: no lesions on visible extremities Neuro: No focal deficits.  Cranial nerves intact  ASSESSMENT:  1.  Alternating bowel habits with a tendency toward constipation 2.  GERD with history of peptic stricture.  Asymptomatic post dilation on PPI 3.  Colonoscopy and upper endoscopy performed May 2021 as described.   PLAN:  1.  Recommend Metamucil 2 heaping tablespoons in 14 ounces of water or juice daily 2.  Colace take 1 at night 3.  MiraLAX as needed for severe constipation 4.  Routine GI follow-up 1 year.  Contact the office in the interim for any questions or problems

## 2020-10-30 DIAGNOSIS — I2510 Atherosclerotic heart disease of native coronary artery without angina pectoris: Secondary | ICD-10-CM | POA: Diagnosis not present

## 2020-10-30 DIAGNOSIS — I77810 Thoracic aortic ectasia: Secondary | ICD-10-CM | POA: Diagnosis not present

## 2020-10-30 DIAGNOSIS — I10 Essential (primary) hypertension: Secondary | ICD-10-CM | POA: Diagnosis not present

## 2020-10-30 DIAGNOSIS — I480 Paroxysmal atrial fibrillation: Secondary | ICD-10-CM | POA: Diagnosis not present

## 2020-10-30 LAB — LIPID PANEL
Chol/HDL Ratio: 2.9 ratio (ref 0.0–5.0)
Cholesterol, Total: 91 mg/dL — ABNORMAL LOW (ref 100–199)
HDL: 31 mg/dL — ABNORMAL LOW (ref >39)
LDL Chol Calc (NIH): 45 mg/dL (ref 0–99)
Triglycerides: 72 mg/dL (ref 0–149)
VLDL Cholesterol Cal: 15 mg/dL (ref 5–40)

## 2020-10-31 ENCOUNTER — Ambulatory Visit: Payer: PPO | Admitting: Cardiology

## 2020-10-31 ENCOUNTER — Encounter: Payer: Self-pay | Admitting: Cardiology

## 2020-10-31 ENCOUNTER — Other Ambulatory Visit: Payer: Self-pay

## 2020-10-31 VITALS — BP 98/72 | HR 71 | Ht 69.0 in | Wt 175.0 lb

## 2020-10-31 DIAGNOSIS — I2510 Atherosclerotic heart disease of native coronary artery without angina pectoris: Secondary | ICD-10-CM | POA: Diagnosis not present

## 2020-10-31 DIAGNOSIS — E785 Hyperlipidemia, unspecified: Secondary | ICD-10-CM

## 2020-10-31 DIAGNOSIS — I480 Paroxysmal atrial fibrillation: Secondary | ICD-10-CM | POA: Diagnosis not present

## 2020-10-31 DIAGNOSIS — Z7901 Long term (current) use of anticoagulants: Secondary | ICD-10-CM

## 2020-10-31 MED ORDER — APIXABAN 5 MG PO TABS
5.0000 mg | ORAL_TABLET | Freq: Two times a day (BID) | ORAL | 0 refills | Status: DC
Start: 2020-10-31 — End: 2020-11-22

## 2020-10-31 NOTE — Progress Notes (Signed)
Primary Care Provider: Lujean Amel, MD Cardiologist: Glenetta Hew, MD Electrophysiologist: None  Clinic Note: Chief Complaint  Patient presents with   Follow-up    Doing well.  No anginal or CHF symptoms   Atrial Fibrillation    He thinks he may have had 1 episode last week where he felt more short of breath on exertion with some weakness and tiredness, but nothing prolonged more than a few hours.  Does not feel irregular heartbeats   ===================================  ASSESSMENT/PLAN   Problem List Items Addressed This Visit       Cardiology Problems   Coronary artery calcification seen on CAT scan (Chronic)    Agonist and score less than 100. No active anginal symptoms.  Seems to tolerate A. fib relatively well.  Clearly no aspirin as he is on Eliquis. On statin and calcium channel blocker      Relevant Medications   apixaban (ELIQUIS) 5 MG TABS tablet   Paroxysmal atrial fibrillation (HCC) CHA2VASC2 = 3; on Eliiquis - Primary (Chronic)    Clearly having intermittent episodes of PAF, but nothing prolonged.  For the most part seems to be pretty well rate controlled with low-dose diltiazem.  Certainly do not have blood pressure room to titrate further.  No bleeding issues with apixaban.      Relevant Medications   apixaban (ELIQUIS) 5 MG TABS tablet   Other Relevant Orders   EKG 12-Lead (Completed)   Hyperlipidemia with target LDL less than 100 (Chronic)    On 20 mg rosuvastatin.  Most recent lipids showed LDL of 106.  Not quite we would like to be.  Continue to monitor.  Thankfully, his coronary calcium was not significantly elevated.      Relevant Medications   apixaban (ELIQUIS) 5 MG TABS tablet     Other   Current use of long term anticoagulation (Chronic)    PAF with CHA2DS2-VASc score of 3. ->  On standing dose of Eliquis.  Okay to hold Eliquis 24-48 hours for minor procedures and up to 72 hours for higher risk procedures.       ===================================  HPI:    Eduardo Fuentes is a 70 y.o. male with a PMH of ILD (Pulm Fibrosis)+, Coronary Calcium (Agatston Score 88) , HTN, & recent Dx of PAF (Dx March 2022 -> CHADS2VASC 3) who presents today for 1 month follow-up.  My last saw Som in April 2021, it was a follow-up for incidental finding of coronary calcium seen on CT scan.  We did a coronary calcium score revealing an Agatston score of 88.  He was doing well with no symptoms whatsoever chest pain or pressure with rest or exertion.  Baseline exertional dyspnea from his pulmonary fibrosis but no recurrence.  Only noted a slow progression in dyspnea with some nighttime coughing only.  Plan was to target LDL less than 100, but okay did not treat with aspirin.  He was referred to the A. fib clinic-seen in March 2022 after PCP office visit revealing the patient being in A. fib with controlled rate.  Was started on Eliquis and diltiazem.  Follow-up EKG at PCPs office revealed restoration of sinus rhythm. => He was evaluated with an echocardiogram (reviewed below).  Was noted to have some labile blood pressures.  Eduardo Fuentes was last seen on October 01, 2020 by Mr. Fenton, PA in the A. fib clinic noting that at least 2 or 3 times in the preceding week he has had some more  fatigue and dyspnea.  Was concerned that he was back in A. fib.  Thankfully, the EKG in the clinic revealed sinus rhythm. Plan was to continue with rate control and only consider AAD if symptomatic A. fib episodes become more frequent.  Recent Hospitalizations: None  Reviewed  CV studies:    The following studies were reviewed today: (if available, images/films reviewed: From Epic Chart or Care Everywhere) - Epic records are reviewed at length today Echo 06/22/2020: LVEF 60-65%. No RWMA. Moderate Basal0-Septal Asymmetric LVH. Gr1 DD. Normal RV size & Fxn with normal RVP/RAP.  Mild AoV Sclerosis w/o Stensosis. Ao Root 40 mm (mild dilation)     Interval History:   Eduardo Fuentes returns here today for a 1 month follow-up with his "primary cardiologist ".  He seems to doing pretty well.  It sounds like he is not always aware of whether he is or is not in A. fib.  He does not feel irregular heartbeats.  When he does note his just feeling little bit of dyspnea worsening exertional dyspnea with really weak and fatigued.  He thinks he may have had a brief episode this past week.  But otherwise he seems to be doing fairly well.  Has not noted any prolonged episodes.  He is actually exercising relatively frequently now least 2 to 3 days a week he works out on a treadmill walking for 25 to 5mnutes.  He also does yard work (avoiding the heat of the day)..  He does indicate that he is not as energetic as he once was, but does [place] any significant fatigue.  CV Review of Symptoms (Summary) Cardiovascular ROS: positive for - No change from baseline dyspnea/exertional dyspnea with exception of a few intermittent episodes of worsening fatigue and short of breath that lasts a few hours. negative for - chest pain, edema, orthopnea, paroxysmal nocturnal dyspnea, rapid heart rate, or lightheadedness or dizziness, syncope/near syncope or TIA/amaurosis fugax, claudication  REVIEWED OF SYSTEMS   Review of Systems  Constitutional:  Negative for malaise/fatigue (Only intermittent spells which may be related to A. fib.) and weight loss.  HENT:  Negative for congestion and nosebleeds.   Respiratory:  Positive for cough (Chronic, nonproductive). Negative for shortness of breath and wheezing.   Cardiovascular:        Per HPI  Gastrointestinal:  Negative for blood in stool and melena.  Genitourinary:  Negative for hematuria.  Musculoskeletal:  Negative for joint pain and myalgias.  Neurological:  Negative for dizziness, weakness and headaches.  Psychiatric/Behavioral: Negative.     I have reviewed and (if needed) personally updated the patient's problem  list, medications, allergies, past medical and surgical history, social and family history.   PAST MEDICAL HISTORY   Past Medical History:  Diagnosis Date   Agatston CAC score, <100 04/2018   Arthritis    NECK   Cough    NON-PRODUCTIVE   Depressive disorder 10/2011   follow by dr [facility]; has anxiety and panic disorder as well.   Diverticulosis    Diverticulosis of colon    Followed by Dr. PHenrene Pastor  Essential hypertension    Generalized anxiety disorder    GERD (gastroesophageal reflux disease)    WATCHES DIET   Hemorrhoid    Hiatal hernia    Describes a sliding hiatal hernia   History of colon polyps    History of kidney stones    Idiopathic pulmonary fibrosis ([place]    Microscopic hematuria    Nocturia  Panic attacks    Prostate cancer ([place]) 07/2012   Dr. Arnell Sieving urology.   Sinus infection    Tinnitus    LEFT EAR -- CHRONIC    PAST SURGICAL HISTORY   Past Surgical History:  Procedure Laterality Date   COLONOSCOPY  03/04/2013   ESOPHAGOGASTRODUODENOSCOPY     EXTRACORPOREAL SHOCK WAVE LITHOTRIPSY  YRS AGO   PROSTATE BIOPSY     RADIOACTIVE SEED IMPLANT N/A 02/02/2013   Procedure: RADIOACTIVE SEED IMPLANT;  Surgeon: Molli Hazard, MD;  Location: [facility];  Service: Urology;  Laterality: N/A;   TONSILLECTOMY AND ADENOIDECTOMY  AS CHILD   TRANSTHORACIC ECHOCARDIOGRAM  06/22/2020   LVEF 60-65%. No RWMA. Moderate Basal0-Septal Asymmetric LVH. Gr1 DD. Normal RV size & Fxn with normal RVP/RAP.  Mild AoV Sclerosis w/o Stensosis. Ao Root 40 mm (mild dilation)   UPPER GASTROINTESTINAL ENDOSCOPY  2019    Immunization History  Administered Date(s) Administered   Fluad Quad(high Dose 65+) 12/30/2018   Influenza Inj Mdck Quad Pf 11/09/2017   Influenza Split 12/05/2005, 11/27/2010, 12/19/2011, 11/26/2012   Influenza,inj,Quad PF,6+ Mos 10/29/2016, 11/09/2017, 11/18/2018, 11/18/2019   Influenza,inj,quad, With Preservative 10/25/2013,  11/27/2014, 10/26/2015   PFIZER(Purple Top)SARS-COV-2 Vaccination 04/02/2019, 04/27/2019, 12/10/2019, 07/27/2020   Pneumococcal Conjugate-13 10/26/2015   Pneumococcal Polysaccharide-23 10/29/2016   Td 02/24/1998   Tdap 07/15/2011   Zoster Recombinat (Shingrix) 12/03/2018, 02/01/2019   Zoster, Live 03/19/2012    MEDICATIONS/ALLERGIES   Current Meds  Medication Sig   apixaban (ELIQUIS) 5 MG TABS tablet Take 1 tablet (5 mg total) by mouth 2 (two) times daily.   clonazePAM (KLONOPIN) 1 MG tablet Take 1 tablet at 8 am, 1/2 tablet at 2 pm and 1 tablet at bedtime for anxiety.   diltiazem (CARDIZEM CD) 120 MG 24 hr capsule TAKE 1 CAPSULE(120 MG) BY MOUTH DAILY   gabapentin (NEURONTIN) 300 MG capsule Take 600 mg by mouth 2 (two) times daily.    mirtazapine (REMERON) 30 MG tablet Take 30 mg by mouth at bedtime.    Nintedanib (OFEV) 150 MG CAPS Take 1 capsule (150 mg total) by mouth 2 (two) times daily.   ondansetron (ZOFRAN) 4 MG tablet TAKE 1 TO 2 TABLETS BY MOUTH WITH OFEV   pantoprazole (PROTONIX) 40 MG tablet TAKE 1 TABLET(40 MG) BY MOUTH DAILY   psyllium (METAMUCIL) 58.6 % powder Take 1 packet by mouth as needed.   rosuvastatin (CRESTOR) 20 MG tablet Take 1 tablet (20 mg total) by mouth daily.   Current Facility-Administered Medications for the 10/31/20 encounter (Office Visit) with Leonie Man, MD  Medication   0.9 %  sodium chloride infusion    Allergies  Allergen Reactions   Aleve [Naproxen Sodium] Anaphylaxis and Other (See Comments)    GI UPSET Patient does not remember anaphylaxis   Hyzaar [Losartan Potassium-Hctz] Other (See Comments)    Felt bad    Lisinopril Cough   Demerol [Meperidine] Rash         SOCIAL HISTORY/FAMILY HISTORY   Reviewed in Epic:  Pertinent findings:  Social History   Tobacco Use   Smoking status: Never   Smokeless tobacco: Never  Vaping Use   Vaping Use: Never used  Substance Use Topics   Alcohol use: No   Drug use: No   Social  History   Social History Narrative   Pt lives in single story home with is wife   12th grade education   Retired Programmer, systems from [employer] ;  Maybe 2-3 alcoholic beverages a month.  No caffeine.    OBJCTIVE -PE, EKG, labs   Wt Readings from Last 3 Encounters:  10/31/20 175 lb (79.4 kg)  10/24/20 176 lb (79.8 kg)  10/01/20 178 lb 6.4 oz (80.9 kg)    Physical Exam: BP 98/72   Pulse 71   Ht '5\' 9"'$  (1.753 m)   Wt 175 lb (79.4 kg)   SpO2 93%   BMI 25.84 kg/m  Physical Exam Vitals reviewed.  Constitutional:      General: He is not in acute distress.    Appearance: Normal appearance. He is normal weight. He is not ill-appearing (Well-nourished, well-groomed.) or toxic-appearing.  Neck:     Vascular: No carotid bruit or JVD.  Cardiovascular:     Rate and Rhythm: Normal rate and regular rhythm. Occasional Extrasystoles are present.    Chest Wall: PMI is not displaced.     Pulses: Normal pulses.     Heart sounds: S1 normal and S2 normal. Heart sounds are distant.    No friction rub. No gallop.  Pulmonary:     Effort: Pulmonary effort is normal. No respiratory distress.     Breath sounds: No wheezing, rhonchi (Mild basal rhonchi with diffuse faint interstitial sounds) or rales.  Chest:     Chest wall: No tenderness.  Musculoskeletal:        General: Swelling (Trivial ankle swelling) present. Normal range of motion.     Cervical back: Normal range of motion.  Skin:    General: Skin is warm and dry.     Coloration: Skin is not pale.  Neurological:     General: No focal deficit present.     Mental Status: He is alert and oriented to person, place, and time.     Motor: No weakness.  Psychiatric:        Mood and Affect: Mood normal.        Behavior: Behavior normal.        Thought Content: Thought content normal.        Judgment: Judgment normal.     Adult ECG Report  Rate: 71 ;  Rhythm: normal sinus rhythm, premature atrial contractions  (PAC), and left atrial abnormality.  Otherwise normal axis, intervals and durations. ;   Narrative Interpretation: Stable  Recent Labs: Reviewed Lab Results  Component Value Date   CHOL 91 (L) 10/30/2020   HDL 31 (L) 10/30/2020   LDLCALC 45 10/30/2020   TRIG 72 10/30/2020   CHOLHDL 2.9 10/30/2020   Lab Results  Component Value Date   CREATININE 1.50 (H) 05/03/2020   BUN 22 05/03/2020   NA 137 05/03/2020   K 4.2 05/03/2020   CL 100 05/03/2020   CO2 27 05/03/2020   CBC Latest Ref Rng & Units 06/12/2020 05/03/2020 01/15/2018  WBC 4.0 - 10.5 K/uL 6.4 9.6 8.0  Hemoglobin 13.0 - 17.0 g/dL 15.7 16.3 15.2  Hematocrit 39.0 - 52.0 % 47.1 47.6 46.1  Platelets 150.0 - 400.0 K/uL 283.0 333 306    No results found for: HGBA1C Lab Results  Component Value Date   TSH 1.730 10/29/2011    ==================================================  COVID-19 Education: The signs and symptoms of COVID-19 were discussed with the patient and how to seek care for testing (follow up with PCP or arrange E-visit).    I spent a total of 18 minutes with the patient spent in direct patient consultation.  Additional time spent with chart review  / charting (studies, outside notes, etc):  15 min Total Time: 33 min  Current medicines are reviewed at length with the patient today.  (+/- concerns) none  This visit occurred during the SARS-CoV-2 public health emergency.  Safety protocols were in place, including screening questions prior to the visit, additional usage of staff PPE, and extensive cleaning of exam room while observing appropriate contact time as indicated for disinfecting solutions.  Notice: This dictation was prepared with Dragon dictation along with smaller phrase technology. Any transcriptional errors that result from this process are unintentional and may not be corrected upon review.  Patient Instructions / Medication Changes & Studies & Tests Ordered   Patient Instructions  Medication  Instructions:  No changes  *If you need a refill on your cardiac medications before your next appointment, please call your pharmacy*  Other Instructions  If you have breakthrough atrial fibrillation  contact office for further advice   Lab Work: Not needed    Testing/Procedures:  Not needed  Follow-Up: At [facility], you and your health needs are our priority.  As part of our continuing mission to provide you with exceptional heart care, we have created designated Provider Care Teams.  These Care Teams include your primary Cardiologist (physician) and Advanced Practice Providers (APPs -  Physician Assistants and Nurse Practitioners) who all work together to provide you with the care you need, when you need it.   .  Your next appointment:   12 month(s)  The format for your next appointment:   In Person  Provider:   You may see Glenetta Hew, MD    Studies Ordered:   Orders Placed This Encounter  Procedures   EKG 12-Lead     Glenetta Hew, M.D., M.S. Interventional Cardiologist   Pager # [phone] Phone # [phone] [address]. [place], [place] [address]   Thank you for choosing [facility] at [facility]!!

## 2020-10-31 NOTE — Patient Instructions (Addendum)
Medication Instructions:  No changes  *If you need a refill on your cardiac medications before your next appointment, please call your pharmacy*  Other Instructions  If you have breakthrough atrial fibrillation  contact office for further advice   Lab Work: Not needed    Testing/Procedures:  Not needed  Follow-Up: At [facility], you and your health needs are our priority.  As part of our continuing mission to provide you with exceptional heart care, we have created designated Provider Care Teams.  These Care Teams include your primary Cardiologist (physician) and Advanced Practice Providers (APPs -  Physician Assistants and Nurse Practitioners) who all work together to provide you with the care you need, when you need it.   .  Your next appointment:   12 month(s)  The format for your next appointment:   In Person  Provider:   You may see Glenetta Hew, MD

## 2020-11-01 ENCOUNTER — Telehealth: Payer: Self-pay

## 2020-11-01 NOTE — Telephone Encounter (Addendum)
Spoke with patient regarding blood test reuslts.----- Message from Warren Lacy, PA-C sent at 11/01/2020 10:33 AM EDT ----- Cholesterol is now at goal (LDL <70) with the start Crestor. Your bad cholesterol went down from 106 to 45.  This is fantastic.  Continue medication.

## 2020-11-03 ENCOUNTER — Encounter: Payer: Self-pay | Admitting: Cardiology

## 2020-11-03 NOTE — Assessment & Plan Note (Signed)
Clearly having intermittent episodes of PAF, but nothing prolonged.  For the most part seems to be pretty well rate controlled with low-dose diltiazem.  Certainly do not have blood pressure room to titrate further.  No bleeding issues with apixaban.

## 2020-11-03 NOTE — Assessment & Plan Note (Signed)
On 20 mg rosuvastatin.  Most recent lipids showed LDL of 106.  Not quite we would like to be.  Continue to monitor.  Thankfully, his coronary calcium was not significantly elevated.

## 2020-11-03 NOTE — Assessment & Plan Note (Signed)
Agonist and score less than 100. No active anginal symptoms.  Seems to tolerate A. fib relatively well.  Clearly no aspirin as he is on Eliquis. On statin and calcium channel blocker

## 2020-11-03 NOTE — Assessment & Plan Note (Signed)
PAF with CHA2DS2-VASc score of 3. ->  On standing dose of Eliquis.   Okay to hold Eliquis 24-48 hours for minor procedures and up to 72 hours for higher risk procedures.

## 2020-11-08 ENCOUNTER — Ambulatory Visit: Payer: PPO | Admitting: Internal Medicine

## 2020-11-08 ENCOUNTER — Encounter: Payer: Self-pay | Admitting: Internal Medicine

## 2020-11-08 ENCOUNTER — Other Ambulatory Visit: Payer: Self-pay

## 2020-11-08 ENCOUNTER — Ambulatory Visit (INDEPENDENT_AMBULATORY_CARE_PROVIDER_SITE_OTHER): Payer: PPO | Admitting: Internal Medicine

## 2020-11-08 VITALS — BP 104/64 | HR 73 | Temp 98.0°F | Ht 69.0 in | Wt 175.0 lb

## 2020-11-08 DIAGNOSIS — J84112 Idiopathic pulmonary fibrosis: Secondary | ICD-10-CM

## 2020-11-08 DIAGNOSIS — R634 Abnormal weight loss: Secondary | ICD-10-CM | POA: Diagnosis not present

## 2020-11-08 DIAGNOSIS — Z5181 Encounter for therapeutic drug level monitoring: Secondary | ICD-10-CM | POA: Diagnosis not present

## 2020-11-08 DIAGNOSIS — T50905A Adverse effect of unspecified drugs, medicaments and biological substances, initial encounter: Secondary | ICD-10-CM

## 2020-11-08 LAB — PULMONARY FUNCTION TEST
DL/VA % pred: 94 %
DL/VA: 3.85 ml/min/mmHg/L
DLCO cor % pred: 61 %
DLCO cor: 15.56 ml/min/mmHg
DLCO unc % pred: 61 %
DLCO unc: 15.56 ml/min/mmHg
FEF 25-75 Pre: 1.62 L/sec
FEF2575-%Pred-Pre: 68 %
FEV1-%Pred-Pre: 56 %
FEV1-Pre: 1.77 L
FEV1FVC-%Pred-Pre: 101 %
FEV6-%Pred-Pre: 59 %
FEV6-Pre: 2.38 L
FEV6FVC-%Pred-Pre: 106 %
FVC-%Pred-Pre: 55 %
FVC-Pre: 2.38 L
Pre FEV1/FVC ratio: 74 %
Pre FEV6/FVC Ratio: 100 %

## 2020-11-08 LAB — CBC WITH DIFFERENTIAL/PLATELET
Basophils Absolute: 0 10*3/uL (ref 0.0–0.1)
Basophils Relative: 0.4 % (ref 0.0–3.0)
Eosinophils Absolute: 0.1 10*3/uL (ref 0.0–0.7)
Eosinophils Relative: 1.5 % (ref 0.0–5.0)
HCT: 42.2 % (ref 39.0–52.0)
Hemoglobin: 14.2 g/dL (ref 13.0–17.0)
Lymphocytes Relative: 24 % (ref 12.0–46.0)
Lymphs Abs: 1.6 10*3/uL (ref 0.7–4.0)
MCHC: 33.7 g/dL (ref 30.0–36.0)
MCV: 96.4 fl (ref 78.0–100.0)
Monocytes Absolute: 0.6 10*3/uL (ref 0.1–1.0)
Monocytes Relative: 8.5 % (ref 3.0–12.0)
Neutro Abs: 4.3 10*3/uL (ref 1.4–7.7)
Neutrophils Relative %: 65.6 % (ref 43.0–77.0)
Platelets: 224 10*3/uL (ref 150.0–400.0)
RBC: 4.38 Mil/uL (ref 4.22–5.81)
RDW: 14.3 % (ref 11.5–15.5)
WBC: 6.5 10*3/uL (ref 4.0–10.5)

## 2020-11-08 NOTE — Addendum Note (Signed)
Addended by: Suzzanne Cloud E on: 11/08/2020 03:47 PM   Modules accepted: Orders

## 2020-11-08 NOTE — Progress Notes (Signed)
Primary Care Provider: Lujean Amel, MD Cardiologist: Glenetta Hew, MD Electrophysiologist: None  Pulmonologist: Dr. Chase Caller GI: Dr. Henrene Pastor Urology: Dr. Serita Butcher Neuro: Dr. Jannifer Franklin Psych: Dr. Carolee Rota 01/29/2018  Chief Complaint  Patient presents with   Pulmonary Consult    self referral, abnormal CT, ? ILD from CT scan, SOB with exertion     Eduardo Fuentes presents self-referral for interstitial lung disease.  His wife Eduardo Fuentes used to be seen by me some years ago when she had surgical lung biopsy for ruling out interstitial lung disease which we did.  Patient is a non-smoker but has long suffered from acid reflux and esophageal stenosis.  He also has a sliding hiatal hernia.  He says since spring 2019 he has had insidious onset of shortness of breath that is progressive associated with some cough.  During this time he is also had worsening endogenous depression.  He was seeing a psychiatrist multiple antidepressants have failed.  Electroconvulsive therapy has been recommended and he had one on January 27, 2018 2 days ago.  During this time a chest x-ray was done as part of pre-electroconvulsive therapy evaluation and this suggested interstitial lung disease.  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  I agree with the radiologist.  The some amount of mediastinal adenopathy that is felt to be reactive.  There is also a sliding hiatal hernia.  It is described that these findings are new compared to 2014 chest x-ray.  At this point in time walking desaturation test he did not desaturate but dropped 4 points and pulse ox    [place] Integrated Comprehensive ILD Questionnaire  Symptoms: He reports shortness of breath onset gradually for the last 1 year.  It is getting worse with time.  He does have episodic dyspnea.  When he does shopping it is level 1.  When he walks on a level ground  with others of his age at this level 1 walking up stairs at this level 1 but walking at his own pace at this level 2 and taking a shower at level 3.  He does have associated arthralgia and cough.  He is not sure when the cough started.  It is present at all positions since it started it is the same as moderate in intensity.  He does cough at night.  He does bring up phlegm which is clear in color.  It gets worse when he lies down and it affects his voice and he does clear his throat.  He does not feel a tickle.  There is no wheezing.   Past Medical History : This is positive for major depression but he circled no for asthma, COPD.  Surgical no for heart failure.  Surgical no for collagen vascular disease with sleep apnea.  He did not answer for acid reflux which he does have..  Denies diabetes or thyroid disease or heart disease or pulmonary embolism or pneumonia   ROS:  .  Positive for difficulty swallowing due to his esophageal stricture history.  Does have nausea and acid reflux and snoring.  Otherwise no vomiting is positive for soreness across the body after electroconvulsive therapy 2 days ago.   FAMILY HISTORY of LUNG DISEASE: Denies family history of any pulmonary fibrosis COPD or asthma sarcoidosis   EXPOSURE HISTORY: Denies smoking cigarettes or tobacco or marijuana or cocaine.  No intravenous drug abuse.  HOME and HOBBY DETAILS : He did not list the age of the home but is lived in this home for 25 [place].  There is no mold in the house of mildew.  The house is not damp.  Does not use a CPAP mask.  No nebulizer machine.  No use of Jacuzzi.  Nose fountain in the house.  No pet birds or gerbils.  Does not use feather pillows.  He is not sure if there is mold in the [facility] duct system.  Does not play wind instruments.  He occasionally mows the lawn   OCCUPATIONAL HISTORY (122 questions) : Positive for social gardening.  Did report that he is used feather pillows and blankets  in the past but otherwise negative including working in a dusty environment   [place] (27 items): Positive for cancer chemotherapy but he does not know the details      OV 03/16/2018  Subjective:  Patient ID: Eduardo Fuentes, male , DOB: 31-Mar-1950 , age 70 y.o. , MRN: [id number] , ADDRESS: [place] [address]   03/16/2018 -   Chief Complaint  Patient presents with   Follow-up    Pt started OFEV 02/27/2018.  Pt states he has been doing okay since last visit. Denies any current complaints with the OFEV. Pt states he does have SOB which is mainy with activities.   IPF [clinical diagnosis based on classic UIP scan negative serology and age and male gender; diagnosis given February 11, 2017]  HPI Eduardo Fuentes 70 y.o. -IPF follow-up.  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  He was started on nintedanib.  He started this on February 27, 2018.  Suffice tolerating it well without any problems.  He is debating about starting pulmonary rehabilitation.  Mostly does not want to pay $20 a week for this.  The other alternative is that he can go to [employer] and exercise by himself without any co-pay but there is no trainer.  I asked him to look at rehab as a short-term thing with the help of a trainer and the cost of a trainer being $20 a week.  He then opened up to the idea of attending pulmonary rehabilitation.  He still and his wife both had questions about IPF and etiology.  Be on sliding hiatal hernia but not able to discover anything else.  His reflux is better controlled now with PPI.  In terms of her shortness of breath this is stable.  In terms of his cough this is stable.  He continues to deal with depression but is not suicidal or homicidal.  His walking desaturation test today shows stability.  Wife wanted some education material about IPF and nintedanib.         OV 04/29/2018  Subjective:  Patient ID: Eduardo Fuentes, male , DOB:  05/04/1950 , age 2 y.o. , MRN: [id number] , ADDRESS: [place] [address]   04/29/2018 -   Chief Complaint  Patient presents with   Follow-up    Pt states he has had some weakness going on which he states could be due to not eating enough with the OFEV. Pt also has had some loose stools as well as nausea.    IPF [clinical diagnosis based on classic UIP scan negative serology and age and male gender; diagnosis given February 11, 2017 and on Ofev since 02/27/2018   HPI Eduardo Fuentes 70 y.o. -  presents for follow-up of his IPF.  He is now doing nintedanib for 2 months.  Overall stable.  Overall tolerating nintedanib really well.  He had some low appetite.  There.  Some nausea he had and that yesterday he had some diarrhea after eating pizza.  He thinks it is a pizza that caused the diarrhea not the nintedanib.  In terms of his dyspnea he stable quality of life is stable.  He said he is doing well with pulmonary rehabilitation.  Maintain his pulse ox 90% or so.  He is asking if the nintedanib is helping him in terms of disease stability.  Discussed that this might not be known fully in an individual.  His mood is better   Results for Eduardo Fuentes, Eduardo Fuentes (MRN [place]:[id number]) as of 03/16/2018 14:30  Ref. Range 01/29/2018 12:42  ASPERGILLUS FUMIGATUS Latest Ref Range: NEGATIVE  NEGATIVE  [place] Serum Latest Ref Range: NEGATIVE  NEGATIVE  Anti Nuclear Antibody(ANA) Latest Ref Range: NEGATIVE  POSITIVE (A)  ANA Pattern 1 Unknown Nuclear, Homogeneous (A)  ANA Titer 1 Latest Units: titer 0000000 (H)  Cyclic Citrullin Peptide Ab Latest Units: UNITS <16  RA Latex Turbid. Latest Ref Range: <14 IU/mL <14   ROS - per HPI  Results for Eduardo Fuentes, Eduardo Fuentes (MRN [place]:[id number]) as of 03/16/2018 14:54  Ref. Range 03/08/2018 11:50  AST Latest Ref Range: 0 - 37 U/L 23  ALT Latest Ref Range: 0 - 53 U/L 15  Total Protein Latest Ref Range: 6.0 - 8.3 g/dL 7.2    ROS - per HPI    OV 04/01/2019  Subjective:   Patient ID: Eduardo Fuentes, male , DOB: 11-21-1950 , age 43 y.o. , MRN: [place]:[id number] , ADDRESS: [place] [address]   04/01/2019 -   Chief Complaint  Patient presents with   Follow-up    Pt states hes been feeling ok but has had some nausea after breakfast and morning nap. Very little cough. SOB on exertion. Pt denies any wheezing, fever, or chills.    IPF [clinical diagnosis based on classic UIP scan negative serology and age and male gender; diagnosis given February 11, 2017 and on Ofev since 02/27/2018  HPI Eduardo Fuentes 70 y.o. -on this telephone visit reports nausea with nintedanib in the mornings.  He has a very light breakfast that has applesauce.  He does not eat much then after the breakfast at 9:00 he has some nausea that is mild to moderate in intensity.  Documented below with a question elicited a few days ago.  In terms of shortness of breath he is stable.  He is asking about exercising.  Otherwise he feels good.  At night only occasionally has nausea.  He has enough refills of Zofran but is not taking it.  I saw him a few days ago when he was in the office with his wife and at that time unofficial lung exam showed stable crackles and he looked well.  There is no evidence of ongoing depression or cognitive ability.  His memory is very sharp.     IMPRESSION: 1. The appearance of the lungs is compatible with interstitial lung disease, with a spectrum of findings considered probable usual interstitial pneumonia (UIP) per current ATS guidelines. No significant progression of disease compared to the prior study. 2. Aortic atherosclerosis, in addition to left main and 3 vessel coronary artery disease. Please note that although the presence of coronary artery calcium documents the presence of coronary artery disease, the  severity of this disease and any potential stenosis cannot be assessed on this non-gated CT examination. Assessment for potential risk factor  modification, dietary therapy or pharmacologic therapy may be warranted, if clinically indicated.   Aortic Atherosclerosis (ICD10-I70.0).     Electronically Signed   By: Vinnie Langton M.D.   On: 12/08/2018 15:10 ROS - per HPI  Results for Eduardo Fuentes, Eduardo Fuentes (MRN [place]:[id number]) as of 04/01/2019 09:43  Ref. Range 01/17/2019 12:25  AST Latest Ref Range: 0 - 37 U/L 27  ALT Latest Ref Range: 0 - 53 U/L 21     OV 07/20/2019  Subjective:  Patient ID: Eduardo Fuentes, male , DOB: Sep 15, 1950 , age 47 y.o. , MRN: [place]:[id number] , ADDRESS: [place] [address]  IPF [clinical diagnosis based on classic UIP scan negative serology and age and male gender; diagnosis given February 11, 2017 and on Ofev since 02/27/2018   07/20/2019 -   Chief Complaint  Patient presents with   Follow-up    pt is here to go over pft results. pt states nausea with the ofev mes     HPI Eduardo Fuentes 70 y.o. -was stable at the time of last visit in February 2021. Because of coronary artery calcification I referred him to cardiology. Reviewed Dr. Shanon Brow Harding's notes. No indication for stress test. At that visit he had some nausea that was moderate. He then saw gastroenterologist Dr. Scarlette Shorts. Early in May 2021 this month he underwent upper endoscopy and lower endoscopy. I have reviewed the notes and visualize the results. These are normal. The nausea is felt to be due to drug. He is got moderate nausea although the dyspnea itself is same. He feels it is because of nintedanib. In fact he is also lost weight over 10 pounds since the last 1 year. He is not sure why. Nevertheless he is quite active. From a dyspnea disease standpoint he feels the same but his pulmonary function test shows decline of at least 6% and FVC and 13% and DLCO and since November 2020. He is worried about this.       OV 11/01/2019   Subjective:  Patient ID: Eduardo Fuentes, male , DOB: 05-10-1950, age 62 y.o. years. , MRN: [place]:[id number],   ADDRESS: [place] [address] PCP  Lujean Amel, MD Providers : Treatment Team:  Attending Provider: Brand Males, MD   Chief Complaint  Patient presents with   Follow-up    get pft results.  cough worse,dry cough.     IPF [clinical diagnosis based on classic UIP scan negative serology and age and male gender; diagnosis given February 11, 2017 and on Ofev since 02/27/2018.  Stopped nintedanib May 2021.  Started pirfenidone approximately June 3 or July 29, 2019.  Last CT scan of the chest mid October 2020    HPI Eduardo Fuentes 70 y.o. -presents for follow-up.  At last visit when he saw me face-to-face in May 2020 when I switched his nintedanib to pirfenidone.  This because of nausea and GI side effects and weight loss and also decline in lung function.  Since early June 2020 when he has been on pirfenidone.  He has some nausea with this but is well controlled with Zofran.  He stopped losing weight.  His weight is stable.  He does feel his cough at night is slightly worse than usual but otherwise his shortness of breath and activities are good.  He works out in [employer]  wearing a mask.  He has had his Covid vaccine.  He has no change in his effort tolerance.  His last CT scan of the chest was in mid October 2020 and his ILD was stable.  He is asking about repeating another CT chest.  He is due for liver function test today.  He also wants to have a walk test today.     OV 02/09/2020  Subjective:  Patient ID: Eduardo Fuentes, male , DOB: 1950/08/27 , age 65 y.o. , MRN: [place]:[id number] , ADDRESS: [place] [address] PCP Lujean Amel, MD Patient Care Team: Lujean Amel, MD as PCP - General (Family Medicine) Leonie Man, MD as PCP - Cardiology (Cardiology)  This Provider for this visit: Treatment Team:  Attending Provider: Brand Males, MD      02/09/2020 -   Chief Complaint  Patient presents with   Follow-up    ILD, IPF, doing well      HPI Eduardo Fuentes 70 y.o. -returns for follow-up. After last visit he called in with progressive weight loss and stopped his pirfenidone. Significant GI side effects and worsening weight loss.  some 2 weeks ago he stopped his pirfenidone. After that he has gained weight. His GI side effects have resolved. His wife is here with him. She says he was miserable with pirfenidone. On his CT scan of the chest his last 1 year CT scan shows stability. His recent PFTs also show stability. I did express to him that the suggested antifibrotic was likely being effective although he was extremely miserable. He wanted know his future options. Discussed both antifibrotic's of the two approved standard of care medications and his significant side effects with both of them. After this he expressed interest to rechallenge with nintedanib. I did indicate to him that there was high probability that he would have side effects again with nintedanib. He told me that side effects with pirfenidone was significantly worse. And that with nintedanib it wasn't that significant. He definitely wants to rechallenge himself with nintedanib. I told him I would support that and will closely monitor. He then wanted to ask about other care options. We discussed the potential for research clinical trials as a care option. Explained to him that given the fact that he has had overall progressive disease but recent stability but now intolerant to both antifibrotic that he should look at advanced phase 3 trial. Explained that there are two options both IV infusions one is called to starscape study by [place]. The other is Pamrevulmab by  [employer] which is a monoclonal antibody against connective tissue growth factor. We discussed several aspects of clinical trials. Explained to him that the known side effect profile and safety profile of this drug would be in his best interest. Explained that he could try to expedite therapeutic option with  these trials but the primary purpose would be of human volunteer. We discussed the below         CT Chest data  CT Chest High Resolution  Result Date: 02/07/2020 CLINICAL DATA:  Follow-up interstitial lung disease. EXAM: CT CHEST WITHOUT CONTRAST TECHNIQUE: Multidetector CT imaging of the chest was performed following the standard protocol without intravenous contrast. High resolution imaging of the lungs, as well as inspiratory and expiratory imaging, was performed. COMPARISON:  12/08/2018 high-resolution chest CT. FINDINGS: Cardiovascular: Normal heart size. No significant pericardial effusion/thickening. Three-vessel coronary atherosclerosis. Atherosclerotic nonaneurysmal thoracic aorta. Stable top-normal caliber main pulmonary artery (3.4 cm diameter). Mediastinum/Nodes: No  discrete thyroid nodules. Unremarkable esophagus. No axillary adenopathy. Mild paratracheal adenopathy up to the 1.5 cm on the right (series 2/image 45), stable. Stable mildly enlarged 1.1 cm subcarinal node (series 2/image 70). Stable mild AP window adenopathy up to 1.3 cm (series 2/image 52). Stable mild bilateral hilar adenopathy, poorly delineated on these noncontrast images. Lungs/Pleura: No pneumothorax. No pleural effusion. No acute consolidative airspace disease or lung masses. Stable solid 0.5 cm posterior left upper lobe nodule (series 3/image 69). No new significant pulmonary nodules. No significant lobular air trapping or evidence of tracheobronchomalacia on the expiration sequence. Extensive patchy confluent subpleural reticulation, septal thickening and ground-glass opacity throughout both lungs with associated moderate traction bronchiectasis, architectural distortion and volume loss. Mild basilar predominance to these findings. Mild honeycombing is present in the dependent lower lobes bilaterally. No compelling evidence of interval progression since 12/08/2018 CT. Findings appear slightly progressed since  01/20/2018 baseline chest CT. Upper abdomen: Subcentimeter hypodense right liver lesions are too small to characterize and are unchanged considered benign. Musculoskeletal: No aggressive appearing focal osseous lesions. Moderate thoracic spondylosis. IMPRESSION: 1. Spectrum of findings compatible with basilar predominant fibrotic interstitial lung disease with mild honeycombing and no compelling evidence of interval progression since 12/08/2018 CT. Findings appear slightly progressed since 01/20/2018 baseline chest CT. Findings are consistent with UIP per consensus guidelines: Diagnosis of Idiopathic Pulmonary Fibrosis: An Official ATS/ERS/JRS/ALAT Clinical Practice Guideline. [place], Iss 5, [phone], Oct 25 2016. 2. Stable mild mediastinal and bilateral hilar lymphadenopathy, most compatible with benign reactive adenopathy. 3. Three-vessel coronary atherosclerosis. 4. Aortic Atherosclerosis (ICD10-I70.0). Electronically Signed   By: Ilona Sorrel M.D.   On: 02/07/2020 13:50          OV 11/08/2020  Subjective:  Patient ID: Eduardo Fuentes, male , DOB: 1950/10/21 , age 76 y.o. , MRN: [place]:[id number] , ADDRESS: [place] [address] PCP Lujean Amel, MD Patient Care Team: Lujean Amel, MD as PCP - General (Family Medicine) Leonie Man, MD as PCP - Cardiology (Cardiology)  This Provider for this visit: Treatment Team:  Attending Provider: Brand Males, MD    11/08/2020 -   Chief Complaint  Patient presents with   Follow-up    PFT  performed today.  Pt states he has been doing okay since last visit. States breathing is about the same.    IPF [clinical diagnosis based on classic UIP scan negative serology and age and male gender; diagnosis given February 11, 2017   -  Ofev since 02/27/2018.  Stopped nintedanib May 2021 due to weight loss  - .  Started pirfenidone approximately June 3 or July 29, 2019. -> stopped nov 2021 due to weight loss (worse  than with ofev) and GI side effects   -> wrestart ofev 02/09/2020   Overall progressive between 2019 and 2021 but is stable in the last 1 year between October 2020 and November/December 2021 -> stable April 2022   HPI Eduardo Fuentes 70 y.o. -returns for follow-up.  I personally saw him only the end of last year.  After that in spring 2022 I saw him for research visit.  He screened fail that study [place] because of concomitant medication of Cardizem.  At this point in time he saying is stable.  He goes to the gym and walks 30 minutes few times a week.  He mows the yard.  He gets mild shortness of breath but he says this is all stable.  He really  has not noticed any decline in shortness of breath or cough.  He is also not feeling his weight loss although objectively he has lost weight.  He feels he is tolerating the nintedanib quite well.  On walking desaturation test he dropped 8 points.  This is more than before.  I asked him about the symptoms many different ways but overall it seems like symptom wise he is stable.  He is interested in looking at another clinical trial but wants to ensure that he will be able to continue standard of care therapy.  I have told him that surgical transplant research trials.        SYMPTOM SCALE - ILD 03/28/2019  07/20/2019 - 180# Stop pfev 11/01/2019 182# start esbriet in June 2021 02/09/2020 189# - stopped esbriet 01/24/31 due to weight loss 11/08/2020 175  O2 use ra ra     Shortness of Breath 0 -> 5 scale with 5 being worst (score 6 If unable to do)      At rest 0 0  0 0  Simple tasks - showers, clothes change, eating, shaving 0 0  0 0  Household (dishes, doing bed, laundry) '1 1  1 1  '$ Shopping 1 0  1 1  Walking level at own pace '1 1  1 1  '$ Walking up Stairs '2 2  3 2  '$ Total (30-36) Dyspnea Score '5 4  6 5  '$ How bad is your cough? '1 1  1 1  '$ How bad is your fatigue '1 1  2 2  '$ How bad is nausea '3 2  2 1  '$ How bad is vomiting?  0 0  0 0  How bad is diarrhea? '1 1   1 2  '$ How bad is anxiety? '1 1  1 1  '$ How bad is depression 1 0  2 1       01/29/2018  03/16/2018 190# 04/29/2018 193# 07/20/2019 180# 11/01/2019 182# 02/09/2020 189# 11/08/2020 175#  O2 used Room air rooom air Room air   room air  ra  Number laps completed '3 3 3   '$ three    Comments about pace 250 feet x 3 laps on POD B at [employer] pace Normal pace   normal pace    Resting Pulse Ox/HR 100% and 89/min 100% and 110/min 96% and 93/min 97% and 93/min 100% and HR 90/min 97% and 80/,in 98% and 73  Final Pulse Ox/HR 96% and 109/min 97% and 118/min 91% and 107/min 90% and 110/min 93% and 106/minm 92% and 90/min 90% and 89  Desaturated </= 88% no no no    no  Desaturated <= 3% points Yes, 4poin Yes, 3 poitns Yes, 5 points Yes, 7 points Yes, 7 points Yes, 5 points Yes, 8 points  Got Tachycardic >/= 90/min yes yes yes  yes yes no  Symptoms at end of test no Mild dyspnea Mild dyspnea   No dyspnea Very mild dyspnea  Miscellaneous comments no x x Likely worse stable stable Weight loss    PFT  PFT Results Latest Ref Rng & Units 11/08/2020 11/01/2019 07/18/2019 01/17/2019 08/25/2018 02/11/2018  FVC-Pre L 2.38 2.75 2.65 2.82 2.69 2.90  FVC-Predicted Pre % 55 63 61 65 62 67  FVC-Post L - - - - - 2.52  FVC-Predicted Post % - - - - - 58  Pre FEV1/FVC % % 74 79 83 85 73 86  Post FEV1/FCV % % - - - - - 70  FEV1-Pre L  1.77 2.18 2.20 2.40 1.95 2.50  FEV1-Predicted Pre % 56 68 69 75 61 78  FEV1-Post L - - - - - 1.77  DLCO uncorrected ml/min/mmHg 15.56 16.68 12.67 14.84 14.69 -  DLCO UNC% % 61 65 50 58 57 -  DLCO corrected ml/min/mmHg 15.56 16.68 12.67 - - -  DLCO COR %Predicted % 61 65 50 - - -  DLVA Predicted % 94 109 83 88 97 -  TLC L - - - - - 3.41  TLC % Predicted % - - - - - 50  RV % Predicted % - - - - - 11    IMPRESSION: HRCT APril 2022 - research 1. Spectrum of findings compatible with fibrotic interstitial lung disease with mild honeycombing and slight basilar predominance.  No appreciable interval progression since 02/07/2020 CT. Findings have progressed since baseline 01/20/2018 CT. Findings are consistent with UIP per consensus guidelines: Diagnosis of Idiopathic Pulmonary Fibrosis: An Official ATS/ERS/JRS/ALAT Clinical Practice Guideline. [place], Iss 5, [phone], Oct 25 2016. 2. Stable chronic mediastinal and bilateral hilar lymphadenopathy, most compatible with benign reactive adenopathy. 3. Three-vessel coronary atherosclerosis. 4. Aortic Atherosclerosis (ICD10-I70.0).     Electronically Signed   By: Ilona Sorrel M.D.   On: 06/22/2020 08:46     has a past medical history of Agatston CAC score, <100 (04/2018), Arthritis, Cough, Depressive disorder (10/2011), Diverticulosis, Diverticulosis of colon, Essential hypertension, Generalized anxiety disorder, GERD (gastroesophageal reflux disease), Hemorrhoid, Hiatal hernia, History of colon polyps, History of kidney stones, Idiopathic pulmonary fibrosis ([place]), Microscopic hematuria, Nocturia, Panic attacks, Prostate cancer ([place]) (07/2012), Sinus infection, and Tinnitus.   reports that he has never smoked. He has never used smokeless tobacco.  Past Surgical History:  Procedure Laterality Date   COLONOSCOPY  03/04/2013   ESOPHAGOGASTRODUODENOSCOPY     EXTRACORPOREAL SHOCK WAVE LITHOTRIPSY  YRS AGO   PROSTATE BIOPSY     RADIOACTIVE SEED IMPLANT N/A 02/02/2013   Procedure: RADIOACTIVE SEED IMPLANT;  Surgeon: Molli Hazard, MD;  Location: [facility];  Service: Urology;  Laterality: N/A;   TONSILLECTOMY AND ADENOIDECTOMY  AS CHILD   TRANSTHORACIC ECHOCARDIOGRAM  06/22/2020   LVEF 60-65%. No RWMA. Moderate Basal0-Septal Asymmetric LVH. Gr1 DD. Normal RV size & Fxn with normal RVP/RAP.  Mild AoV Sclerosis w/o Stensosis. Ao Root 40 mm (mild dilation)   UPPER GASTROINTESTINAL ENDOSCOPY  2019    Allergies  Allergen Reactions   Aleve [Naproxen Sodium] Anaphylaxis  and Other (See Comments)    GI UPSET Patient does not remember anaphylaxis   Hyzaar [Losartan Potassium-Hctz] Other (See Comments)    Felt bad    Lisinopril Cough   Demerol [Meperidine] Rash         Immunization History  Administered Date(s) Administered   Fluad Quad(high Dose 65+) 12/30/2018   Influenza Inj Mdck Quad Pf 11/09/2017   Influenza Split 12/05/2005, 11/27/2010, 12/19/2011, 11/26/2012   Influenza,inj,Quad PF,6+ Mos 10/29/2016, 11/09/2017, 11/18/2018, 11/18/2019   Influenza,inj,quad, With Preservative 10/25/2013, 11/27/2014, 10/26/2015   PFIZER(Purple Top)SARS-COV-2 Vaccination 04/02/2019, 04/27/2019, 12/10/2019, 07/27/2020   Pneumococcal Conjugate-13 10/26/2015   Pneumococcal Polysaccharide-23 10/29/2016   Td 02/24/1998   Tdap 07/15/2011   Zoster Recombinat (Shingrix) 12/03/2018, 02/01/2019   Zoster, Live 03/19/2012    Family History  Problem Relation Age of Onset   Dementia Mother    Anxiety disorder Mother    Alzheimer's disease Mother    Bone cancer Father        Metastatic prostate  Cancer Father        prostate   Heart attack Father 16       Referred for CABG   Anxiety disorder Sister    Colon cancer Neg Hx    Esophageal cancer Neg Hx    Rectal cancer Neg Hx    Stomach cancer Neg Hx    Colon polyps Neg Hx      Current Outpatient Medications:    apixaban (ELIQUIS) 5 MG TABS tablet, Take 1 tablet (5 mg total) by mouth 2 (two) times daily., Disp: 28 tablet, Rfl: 0   clonazePAM (KLONOPIN) 1 MG tablet, Take 1 tablet at 8 am, 1/2 tablet at 2 pm and 1 tablet at bedtime for anxiety., Disp: 75 tablet, Rfl: 0   diltiazem (CARDIZEM CD) 120 MG 24 hr capsule, TAKE 1 CAPSULE(120 MG) BY MOUTH DAILY, Disp: 30 capsule, Rfl: 4   gabapentin (NEURONTIN) 300 MG capsule, Take 600 mg by mouth 2 (two) times daily. , Disp: , Rfl:    mirtazapine (REMERON) 30 MG tablet, Take 30 mg by mouth at bedtime. , Disp: , Rfl: 4   Nintedanib (OFEV) 150 MG CAPS, Take 1 capsule (150 mg  total) by mouth 2 (two) times daily., Disp: 180 capsule, Rfl: 3   ondansetron (ZOFRAN) 4 MG tablet, TAKE 1 TO 2 TABLETS BY MOUTH WITH OFEV, Disp: 120 tablet, Rfl: 0   pantoprazole (PROTONIX) 40 MG tablet, TAKE 1 TABLET(40 MG) BY MOUTH DAILY, Disp: 90 tablet, Rfl: 1   psyllium (METAMUCIL) 58.6 % powder, Take 1 packet by mouth as needed., Disp: , Rfl:    rosuvastatin (CRESTOR) 20 MG tablet, Take 1 tablet (20 mg total) by mouth daily., Disp: 90 tablet, Rfl: 3  Current Facility-Administered Medications:    0.9 %  sodium chloride infusion, 500 mL, Intravenous, Once, Irene Shipper, MD      Objective:   Vitals:   11/08/20 1450  BP: 104/64  Pulse: 73  Temp: 98 F (36.7 C)  TempSrc: Oral  SpO2: 98%  Weight: 175 lb (79.4 kg)  Height: '5\' 9"'$  (1.753 m)    Estimated body mass index is 25.84 kg/m as calculated from the following:   Height as of this encounter: '5\' 9"'$  (1.753 m).   Weight as of this encounter: 175 lb (79.4 kg).  '@WEIGHTCHANGE'$ @  [employer]   11/08/20 1450  Weight: 175 lb (79.4 kg)     Physical Exam    General: No distress. Looks sell Neuro: Alert and Oriented x 3. GCS 15. Speech normal Psych: Pleasant Resp:  Barrel Chest - no.  Wheeze - no, Crackles - yes at base, No overt respiratory distress CVS: Normal heart sounds. Murmurs - no Ext: Stigmata of Connective Tissue Disease - no HEENT: Normal upper airway. PEERL +. No post nasal drip        Assessment:       ICD-10-CM   1. IPF (idiopathic pulmonary fibrosis) (HCC)  J84.112 Hepatic function panel    CBC with Differential/Platelet    Basic metabolic panel    Pulmonary function test    2. Encounter for therapeutic drug monitoring  Z51.81 Hepatic function panel    CBC with Differential/Platelet    Basic metabolic panel    3. Drug-induced weight loss  R63.4 Hepatic function panel   T50.905A CBC with Differential/Platelet    Basic metabolic panel         Plan:     Patient Instructions      ICD-10-CM  1. IPF (idiopathic pulmonary fibrosis) ([place])  J84.112     2. Encounter for therapeutic drug monitoring  Z51.81     3. Drug-induced weight loss  R63.4    T50.905A      Subjectively stable but pulmonary function test suggests a slight decline in lung function There is some ongoing weight loss because of the nintedanib but overall the nintedanib seems to be helping you  Plan - Check liver function test, chemistry and CBC - In 3-4 months do spirometry and DLCO -We will ask the research team to evaluate you further clinical trials as a care option  -But it could be a 1-2 months before they reach out to you  Follow-up - Return in 3-4 months to see Dr. Chase Caller but after breathing test  -ILD symptom score and simple walking desaturation test at follow-up     SIGNATURE    Dr. Brand Males, M.D., F.C.C.P,  Pulmonary and Critical Care Medicine Staff Physician, [place] Director - Interstitial Lung Disease  Program  Pulmonary [place] [facility] [place], [place], [address]  Pager: [phone], If no answer or between  15:00h - 7:00h: call [phone] Telephone: [phone]  3:44 PM 11/08/2020

## 2020-11-08 NOTE — Patient Instructions (Signed)
Spirometry and DLCO performed today. °

## 2020-11-08 NOTE — Patient Instructions (Addendum)
ICD-10-CM   1. IPF (idiopathic pulmonary fibrosis) ([place])  J84.112     2. Encounter for therapeutic drug monitoring  Z51.81     3. Drug-induced weight loss  R63.4    T50.905A      Subjectively stable but pulmonary function test suggests a slight decline in lung function There is some ongoing weight loss because of the nintedanib but overall the nintedanib seems to be helping you  Plan - Check liver function test, chemistry and CBC - In 3-4 months do spirometry and DLCO -We will ask the research team to evaluate you further clinical trials as a care option  -But it could be a 1-2 months before they reach out to you  Follow-up - Return in 3-4 months to see Dr. Chase Caller but after breathing test  -ILD symptom score and simple walking desaturation test at follow-up

## 2020-11-08 NOTE — Progress Notes (Deleted)
Spirometry and DLCO performed today. °

## 2020-11-08 NOTE — Progress Notes (Signed)
Spirometry and DLCO performed today. °

## 2020-11-09 LAB — HEPATIC FUNCTION PANEL
ALT: 13 U/L (ref 0–53)
AST: 22 U/L (ref 0–37)
Albumin: 3.7 g/dL (ref 3.5–5.2)
Alkaline Phosphatase: 70 U/L (ref 39–117)
Bilirubin, Direct: 0.1 mg/dL (ref 0.0–0.3)
Total Bilirubin: 0.6 mg/dL (ref 0.2–1.2)
Total Protein: 6.6 g/dL (ref 6.0–8.3)

## 2020-11-09 LAB — BASIC METABOLIC PANEL
BUN: 14 mg/dL (ref 6–23)
CO2: 27 mEq/L (ref 19–32)
Calcium: 8.9 mg/dL (ref 8.4–10.5)
Chloride: 108 mEq/L (ref 96–112)
Creatinine, Ser: 1.44 mg/dL (ref 0.40–1.50)
GFR: 49.08 mL/min — ABNORMAL LOW (ref >60.00)
Glucose, Bld: 79 mg/dL (ref 70–99)
Potassium: 4.6 mEq/L (ref 3.5–5.1)
Sodium: 140 mEq/L (ref 135–145)

## 2020-11-12 ENCOUNTER — Telehealth: Payer: Self-pay | Admitting: Podiatry

## 2020-11-12 NOTE — Telephone Encounter (Signed)
Patient has right foot gout flare out, wanted to know if Dr. Paulla Dolly can send in medication or does he need to be seen

## 2020-11-14 ENCOUNTER — Other Ambulatory Visit: Payer: Self-pay

## 2020-11-14 ENCOUNTER — Ambulatory Visit (INDEPENDENT_AMBULATORY_CARE_PROVIDER_SITE_OTHER): Payer: PPO | Admitting: Podiatry

## 2020-11-14 DIAGNOSIS — M779 Enthesopathy, unspecified: Secondary | ICD-10-CM

## 2020-11-14 DIAGNOSIS — M109 Gout, unspecified: Secondary | ICD-10-CM

## 2020-11-14 MED ORDER — ALLOPURINOL 100 MG PO TABS: 100.0000 mg | ORAL_TABLET | Freq: Every day | ORAL | 6 refills | Status: DC

## 2020-11-14 MED ORDER — TRIAMCINOLONE ACETONIDE 10 MG/ML IJ SUSP: 10.0000 mg | Freq: Once | INTRAMUSCULAR | Status: DC

## 2020-11-14 NOTE — Progress Notes (Signed)
Subjective:   Patient ID: Eduardo Fuentes, male   DOB: 70 y.o.   MRN: [id number]   HPI Patient presents stating that he is started develop swelling and pain began around his right big toe joint and is concerned about gout and would like to start medicine and discuss   ROS      Objective:  Physical Exam  Neurovascular status intact with inflammation around the first MPJ right fluid buildup moderate reduction of motion and approximate fourth such attack since the beginning of the year     Assessment:  Probability of inflammatory capsulitis around the first MPJ right which also may have something to do with structure of the joint surface along with probability for gout that is not responding conservatively and to do changes     Plan:  Repeated again the importance of making good food choices and went ahead we will get a start him on allopurinol with education given today and I want him to check his uric acid level in 6 months.  Due to the intense discomfort I did do sterile prep and injected around the first MPJ 3 mg Kenalog dexamethasone 5 mg Xylocaine and reappoint as needed

## 2020-11-20 DIAGNOSIS — M5416 Radiculopathy, lumbar region: Secondary | ICD-10-CM | POA: Diagnosis not present

## 2020-11-22 ENCOUNTER — Other Ambulatory Visit (HOSPITAL_COMMUNITY): Payer: Self-pay

## 2020-11-22 MED ORDER — APIXABAN 5 MG PO TABS
5.0000 mg | ORAL_TABLET | Freq: Two times a day (BID) | ORAL | 0 refills | Status: DC
Start: 2020-11-22 — End: 2021-04-25

## 2020-11-23 NOTE — Progress Notes (Signed)
Pulmonary function testing results discussed with patient at office visit with Dr. Chase Caller on 11/08/2020.  Wyn Quaker, FNP

## 2020-11-28 ENCOUNTER — Telehealth (HOSPITAL_COMMUNITY): Payer: Self-pay | Admitting: *Deleted

## 2020-11-28 ENCOUNTER — Encounter (HOSPITAL_COMMUNITY): Payer: Self-pay

## 2020-11-28 NOTE — Telephone Encounter (Signed)
Patient approved for Eliquis patient assistance through 02/23/21. Application # [id number]

## 2020-12-10 DIAGNOSIS — E7800 Pure hypercholesterolemia, unspecified: Secondary | ICD-10-CM | POA: Diagnosis not present

## 2020-12-10 DIAGNOSIS — Z23 Encounter for immunization: Secondary | ICD-10-CM | POA: Diagnosis not present

## 2020-12-10 DIAGNOSIS — N1831 Chronic kidney disease, stage 3a: Secondary | ICD-10-CM | POA: Diagnosis not present

## 2020-12-10 DIAGNOSIS — I10 Essential (primary) hypertension: Secondary | ICD-10-CM | POA: Diagnosis not present

## 2020-12-10 DIAGNOSIS — Z0001 Encounter for general adult medical examination with abnormal findings: Secondary | ICD-10-CM | POA: Diagnosis not present

## 2020-12-10 DIAGNOSIS — Z79899 Other long term (current) drug therapy: Secondary | ICD-10-CM | POA: Diagnosis not present

## 2020-12-10 DIAGNOSIS — R195 Other fecal abnormalities: Secondary | ICD-10-CM | POA: Diagnosis not present

## 2020-12-10 DIAGNOSIS — F321 Major depressive disorder, single episode, moderate: Secondary | ICD-10-CM | POA: Diagnosis not present

## 2020-12-10 DIAGNOSIS — I4891 Unspecified atrial fibrillation: Secondary | ICD-10-CM | POA: Diagnosis not present

## 2020-12-10 DIAGNOSIS — C61 Malignant neoplasm of prostate: Secondary | ICD-10-CM | POA: Diagnosis not present

## 2020-12-12 ENCOUNTER — Ambulatory Visit: Payer: PPO

## 2020-12-19 DIAGNOSIS — I4891 Unspecified atrial fibrillation: Secondary | ICD-10-CM | POA: Diagnosis not present

## 2020-12-19 DIAGNOSIS — E7800 Pure hypercholesterolemia, unspecified: Secondary | ICD-10-CM | POA: Diagnosis not present

## 2020-12-19 DIAGNOSIS — I2510 Atherosclerotic heart disease of native coronary artery without angina pectoris: Secondary | ICD-10-CM | POA: Diagnosis not present

## 2020-12-19 DIAGNOSIS — I10 Essential (primary) hypertension: Secondary | ICD-10-CM | POA: Diagnosis not present

## 2020-12-19 DIAGNOSIS — K219 Gastro-esophageal reflux disease without esophagitis: Secondary | ICD-10-CM | POA: Diagnosis not present

## 2020-12-19 DIAGNOSIS — F321 Major depressive disorder, single episode, moderate: Secondary | ICD-10-CM | POA: Diagnosis not present

## 2020-12-26 DIAGNOSIS — N1832 Chronic kidney disease, stage 3b: Secondary | ICD-10-CM | POA: Diagnosis not present

## 2020-12-26 DIAGNOSIS — J0100 Acute maxillary sinusitis, unspecified: Secondary | ICD-10-CM | POA: Diagnosis not present

## 2020-12-31 ENCOUNTER — Telehealth: Payer: Self-pay | Admitting: Internal Medicine

## 2020-12-31 NOTE — Telephone Encounter (Signed)
Spoke with the pt  He is c/o diarrhea off and on past 2 months  Happens a few times per wk, not daily He relates this to ofev   He is on 150 mg bid  He states he does take med with a meal  He takes immodium and this helps  He denies any vomiting, nausea, cramping  Has lost maybe 2 more lbs since his last visit in Sept 2022 Please advise, thanks

## 2020-12-31 NOTE — Telephone Encounter (Signed)
Is he on scheduled immodium? If not he should be on it atleaset 3x/week - eg Tue, Thu, Sat

## 2020-12-31 NOTE — Telephone Encounter (Signed)
Pt states he has spoken with pulmonary and the diarrhea may be coming from [facility] the med for pulmonary fibrosis.He is waiting to hear back from pulmonary but knows to call us back if he needs [place].

## 2020-12-31 NOTE — Telephone Encounter (Signed)
I have called and spoke with pt and he is aware of MR recs.  He stated that he will start with the immodium three times per week and he will update [place] on how this is going.

## 2020-12-31 NOTE — Telephone Encounter (Signed)
Inbound call from patient. States he is experiencing cramping in lower stomach, watery diarrhea after eat and wants to know what can help

## 2021-01-10 ENCOUNTER — Ambulatory Visit (INDEPENDENT_AMBULATORY_CARE_PROVIDER_SITE_OTHER): Payer: PPO | Admitting: Internal Medicine

## 2021-01-10 ENCOUNTER — Encounter: Payer: Self-pay | Admitting: Internal Medicine

## 2021-01-10 VITALS — BP 110/80 | HR 80 | Ht 69.0 in | Wt 170.0 lb

## 2021-01-10 DIAGNOSIS — K5900 Constipation, unspecified: Secondary | ICD-10-CM

## 2021-01-10 DIAGNOSIS — R197 Diarrhea, unspecified: Secondary | ICD-10-CM | POA: Diagnosis not present

## 2021-01-10 DIAGNOSIS — R109 Unspecified abdominal pain: Secondary | ICD-10-CM

## 2021-01-10 DIAGNOSIS — R194 Change in bowel habit: Secondary | ICD-10-CM | POA: Diagnosis not present

## 2021-01-10 NOTE — Progress Notes (Signed)
HISTORY OF PRESENT ILLNESS:  Eduardo Fuentes is a 70 y.o. male with past medical history as listed below who was seen in the office October 24, 2020 regarding alternating bowel habits with a tendency toward constipation.  See that dictation.  He was instructed to initiate Metamucil 2 tablespoons daily and stool softener at night.  He tells me that for about 1 month his bowel habits were normal.  He was pleased.  About 6 weeks ago he reports developing problems with abdominal cramping urgency and loose stools.  On 2 episodes he had fecal leakage.  Around that time he was started on allopurinol for gout.  He stopped his Metamucil.  Not taking stool softener.  Has not needed MiraLAX since his last office visit.  No bleeding.  He has had about 5 or 6 pounds weight loss.  He does notice cramping in the morning.  No fevers or other issues.  REVIEW OF SYSTEMS:  All non-GI ROS negative except for cough, anxiety  Past Medical History:  Diagnosis Date   Agatston CAC score, <100 04/2018   Arthritis    NECK   Cough    NON-PRODUCTIVE   Depressive disorder 10/2011   follow by dr [facility] psychiatry; has anxiety and panic disorder as well.   Diverticulosis    Diverticulosis of colon    Followed by Dr. Henrene Pastor   Essential hypertension    Generalized anxiety disorder    GERD (gastroesophageal reflux disease)    WATCHES DIET   Hemorrhoid    Hiatal hernia    Describes a sliding hiatal hernia   History of colon polyps    History of kidney stones    Idiopathic pulmonary fibrosis ([place])    Microscopic hematuria    Nocturia    Panic attacks    Prostate cancer ([place]) 07/2012   Dr. Arnell Sieving urology.   Sinus infection    Tinnitus    LEFT EAR -- CHRONIC    Past Surgical History:  Procedure Laterality Date   COLONOSCOPY  03/04/2013   ESOPHAGOGASTRODUODENOSCOPY     EXTRACORPOREAL SHOCK WAVE LITHOTRIPSY  YRS AGO   PROSTATE BIOPSY     RADIOACTIVE SEED IMPLANT N/A 02/02/2013   Procedure: RADIOACTIVE  SEED IMPLANT;  Surgeon: Molli Hazard, MD;  Location: [facility];  Service: Urology;  Laterality: N/A;   TONSILLECTOMY AND ADENOIDECTOMY  AS CHILD   TRANSTHORACIC ECHOCARDIOGRAM  06/22/2020   LVEF 60-65%. No RWMA. Moderate Basal0-Septal Asymmetric LVH. Gr1 DD. Normal RV size & Fxn with normal RVP/RAP.  Mild AoV Sclerosis w/o Stensosis. Ao Root 40 mm (mild dilation)   UPPER GASTROINTESTINAL ENDOSCOPY  2019    Social History [place]  reports that he has never smoked. He has never used smokeless tobacco. He reports that he does not drink alcohol and does not use drugs.  family history includes Alzheimer's disease in his mother; Anxiety disorder in his mother and sister; Bone cancer in his father; Cancer in his father; Dementia in his mother; Heart attack (age of onset: 89) in his father.  Allergies  Allergen Reactions   Aleve [Naproxen Sodium] Anaphylaxis and Other (See Comments)    GI UPSET Patient does not remember anaphylaxis   Hyzaar [Losartan Potassium-Hctz] Other (See Comments)    Felt bad    Lisinopril Cough   Demerol [Meperidine] Rash            PHYSICAL EXAMINATION: Vital signs: BP 110/80   Pulse 80   Ht 5\' 9"  (1.753  m)   Wt 170 lb (77.1 kg)   BMI 25.10 kg/m   Constitutional: generally well-appearing, no acute distress Psychiatric: alert and oriented x3, cooperative Eyes: extraocular movements intact, anicteric, conjunctiva pink Mouth: oral pharynx moist, no lesions Neck: supple no lymphadenopathy Cardiovascular: heart regular rate and rhythm, no murmur Lungs: clear to auscultation bilaterally Abdomen: soft, nontender, nondistended, no obvious ascites, no peritoneal signs, normal bowel sounds, no organomegaly Rectal: Omitted Extremities: no clubbing, cyanosis, or lower extremity edema bilaterally Skin: no lesions on visible extremities Neuro: No focal deficits.  Cranial nerves intact  ASSESSMENT:  1.  Recent problems with  abdominal cramping and diarrhea.  This coincident with initiation of allopurinol.  One of the more common side effects of allopurinol is diarrhea.  I suspect that this is the culprit. 2.  Background history of alternating bowel habits with a tendency toward constipation. 3.  GERD with history of peptic stricture.  Asymptomatic on PPI 4.  Colonoscopy and upper endoscopy May 2021   PLAN:  1.  Stop allopurinol 2.  Reinitiate Metamucil 2 tablespoons daily 3.  Office follow-up 6 weeks A total time of 30 minutes was spent preparing to see the patient, obtaining comprehensive history, performing medically appropriate physical examination, counseling the patient regarding the above listed issues, answering multiple questions, directing medical therapies, arranging follow-up, and documenting clinical information in the health record

## 2021-01-10 NOTE — Patient Instructions (Signed)
If you are age 71 or older, your body mass index should be between 23-30. Your Body mass index is 25.1 kg/m. If this is out of the aforementioned range listed, please consider follow up with your Primary Care Provider.  If you are age 80 or younger, your body mass index should be between 19-25. Your Body mass index is 25.1 kg/m. If this is out of the aformentioned range listed, please consider follow up with your Primary Care Provider.   ________________________________________________________  The [place] [facility] providers would like to encourage you to use [facility] to communicate with providers for non-urgent requests or questions.  Due to long hold times on the telephone, sending your provider a message by [facility] may be a faster and more efficient way to get a response.  Please allow 48 business hours for a response.  Please remember that this is for non-urgent requests.  _______________________________________________________  Stop Allopurinol  Resume Metamucil - 2 tablespoons daily  Please follow up on 03/06/2021 at 3:00pm

## 2021-01-21 DIAGNOSIS — R319 Hematuria, unspecified: Secondary | ICD-10-CM | POA: Diagnosis not present

## 2021-01-22 ENCOUNTER — Telehealth: Payer: Self-pay | Admitting: Internal Medicine

## 2021-01-22 MED ORDER — PREDNISONE 10 MG PO TABS: ORAL_TABLET | ORAL | 0 refills | Status: AC

## 2021-01-22 NOTE — Telephone Encounter (Signed)
I have called the patient and gave him the recommendation per Dr. Lavell Anchors and he voices understanding. He did not have any other questions. I have made the follow up per MR recommendation.

## 2021-01-22 NOTE — Telephone Encounter (Signed)
I called the patient and verified DOB and he is feeling that he is short of breath, and coughing that is non productive for about 2-3 weeks with exertion. He has not had any other symptoms and has not been around anyone that has been sick   He has been taking  some Delsym as needed and it helps. Patient wants to know if you can send something in to help with the cough? Last OV was 11/08/20. Please advise.

## 2021-01-22 NOTE — Telephone Encounter (Signed)
      Can try Take prednisone 40 mg daily x 2 days, then 20mg  daily x 2 days, then 10mg  daily x 2 days, then 5mg  daily x 2 days and stop  Last seen in September with advised to come back in follow-up in a few months but I do not see any follow-up.  He needs follow-up based on the instructions from visit in September

## 2021-02-01 DIAGNOSIS — J019 Acute sinusitis, unspecified: Secondary | ICD-10-CM | POA: Diagnosis not present

## 2021-02-01 DIAGNOSIS — R0981 Nasal congestion: Secondary | ICD-10-CM | POA: Diagnosis not present

## 2021-02-04 ENCOUNTER — Telehealth: Payer: Self-pay | Admitting: Internal Medicine

## 2021-02-05 NOTE — Telephone Encounter (Signed)
Just received a message from Carron Curie with front staff stating that the paperwork was just dropped off. Will get all taken care of for pt.

## 2021-02-05 NOTE — Telephone Encounter (Signed)
Please advise if forms were put in your box as they were not in MR's box this morning.

## 2021-02-06 ENCOUNTER — Telehealth: Payer: Self-pay | Admitting: Pharmacist

## 2021-02-06 NOTE — Telephone Encounter (Signed)
Patient re-enrolled into [place] from 01/30/21 through 01/29/2022   Maximum of $9000 per year  ID: [id number] BIN: [id number] PCN: [id number] GROUP: [id number]   HELP DESK [phone]  Called patient to notify and provided with grant billing information and called [facility] and provided with grant billing information   Knox Saliva, PharmD, MPH, BCPS Clinical Pharmacist (Rheumatology and Pulmonology)

## 2021-02-20 ENCOUNTER — Other Ambulatory Visit (HOSPITAL_COMMUNITY): Payer: Self-pay | Admitting: *Deleted

## 2021-02-20 MED ORDER — DILTIAZEM HCL ER COATED BEADS 120 MG PO CP24: ORAL_CAPSULE | ORAL | 6 refills | Status: AC

## 2021-03-01 ENCOUNTER — Other Ambulatory Visit: Payer: Self-pay | Admitting: Internal Medicine

## 2021-03-04 ENCOUNTER — Other Ambulatory Visit: Payer: Self-pay | Admitting: Internal Medicine

## 2021-03-06 ENCOUNTER — Encounter: Payer: Self-pay | Admitting: Internal Medicine

## 2021-03-06 ENCOUNTER — Other Ambulatory Visit (INDEPENDENT_AMBULATORY_CARE_PROVIDER_SITE_OTHER): Payer: PPO

## 2021-03-06 ENCOUNTER — Ambulatory Visit: Payer: PPO | Admitting: Internal Medicine

## 2021-03-06 VITALS — BP 130/82 | HR 76 | Ht 69.0 in | Wt 169.2 lb

## 2021-03-06 DIAGNOSIS — M545 Low back pain, unspecified: Secondary | ICD-10-CM

## 2021-03-06 DIAGNOSIS — R194 Change in bowel habit: Secondary | ICD-10-CM

## 2021-03-06 DIAGNOSIS — Z87442 Personal history of urinary calculi: Secondary | ICD-10-CM | POA: Diagnosis not present

## 2021-03-06 DIAGNOSIS — R110 Nausea: Secondary | ICD-10-CM

## 2021-03-06 DIAGNOSIS — R109 Unspecified abdominal pain: Secondary | ICD-10-CM

## 2021-03-06 NOTE — Progress Notes (Signed)
HISTORY OF PRESENT ILLNESS:  Eduardo Fuentes is a 71 y.o. male with multiple medical problems as listed below.  He was last evaluated in the office January 10, 2021 regarding abdominal cramping and diarrhea.  See that dictation.  Allopurinol was stopped.  He was initiated on Metamucil 2 tablespoons daily.  He presents today for follow-up as requested.  He is accompanied by his wife.  The patient tells me that his problems with diarrhea have resolved.  His bowel habits occur daily and are formed.  He is pleased.  He does continue with some abdominal cramps through defecation.  Defecation results and improvement in cramping sensation.  No nausea or vomiting.  He also complains of low back pain.  He does have a history of kidney stones.  He is wondering if that is the issue.  He request urinalysis.  His wife has several questions regarding her own abdominal complaints  REVIEW OF SYSTEMS:  All non-GI ROS negative except for back pain  Past Medical History:  Diagnosis Date   Agatston CAC score, <100 04/2018   Arthritis    NECK   Cough    NON-PRODUCTIVE   Depressive disorder 10/2011   follow by dr [facility] psychiatry; has anxiety and panic disorder as well.   Diverticulosis    Diverticulosis of colon    Followed by Dr. Henrene Pastor   Essential hypertension    Generalized anxiety disorder    GERD (gastroesophageal reflux disease)    WATCHES DIET   Hemorrhoid    Hiatal hernia    Describes a sliding hiatal hernia   History of colon polyps    History of kidney stones    Idiopathic pulmonary fibrosis ([place])    Microscopic hematuria    Nocturia    Panic attacks    Prostate cancer ([place]) 07/2012   Dr. Arnell Sieving urology.   Sinus infection    Tinnitus    LEFT EAR -- CHRONIC    Past Surgical History:  Procedure Laterality Date   COLONOSCOPY  03/04/2013   ESOPHAGOGASTRODUODENOSCOPY     EXTRACORPOREAL SHOCK WAVE LITHOTRIPSY  YRS AGO   PROSTATE BIOPSY     RADIOACTIVE SEED IMPLANT N/A  02/02/2013   Procedure: RADIOACTIVE SEED IMPLANT;  Surgeon: Molli Hazard, MD;  Location[facility];  Service: Urology;  Laterality: N/A;   TONSILLECTOMY AND ADENOIDECTOMY  AS CHILD   TRANSTHORACIC ECHOCARDIOGRAM  06/22/2020   LVEF 60-65%. No RWMA. Moderate Basal0-Septal Asymmetric LVH. Gr1 DD. Normal RV size & Fxn with normal RVP/RAP.  Mild AoV Sclerosis w/o Stensosis. Ao Root 40 mm (mild dilation)   UPPER GASTROINTESTINAL ENDOSCOPY  2019    Social History Eduardo Fuentes  reports that he has never smoked. He has never used smokeless tobacco. He reports that he does not drink alcohol and does not use drugs.  family history includes Alzheimer's disease in his mother; Anxiety disorder in his mother and sister; Bone cancer in his father; Cancer in his father; Dementia in his mother; Heart attack (age of onset: 70) in his father.  Allergies  Allergen Reactions   Aleve [Naproxen Sodium] Anaphylaxis and Other (See Comments)    GI UPSET Patient does not remember anaphylaxis   Hyzaar [Losartan Potassium-Hctz] Other (See Comments)    Felt bad    Lisinopril Cough   Demerol [Meperidine] Rash            PHYSICAL EXAMINATION: Vital signs: BP 130/82    Pulse 76    Ht 5\' 9"  (  1.753 m)    Wt 169 lb 3.2 oz (76.7 kg)    SpO2 97%    BMI 24.99 kg/m   Constitutional: generally well-appearing, no acute distress Psychiatric: alert and oriented x3, cooperative Eyes: extraocular movements intact, anicteric, conjunctiva pink Mouth: oral pharynx moist, no lesions Neck: supple no lymphadenopathy Cardiovascular: heart regular rate and rhythm, no murmur Lungs: clear to auscultation bilaterally Abdomen: soft, nontender, nondistended, no obvious ascites, no peritoneal signs, normal bowel sounds, no organomegaly Rectal: Omitted Extremities: no clubbing, cyanosis, or lower extremity edema bilaterally Skin: no lesions on visible extremities Neuro: No focal deficits.  Cranial nerves  intact  ASSESSMENT:  1.  History of loose bowels.  Improved with Metamucil 2.  Abdominal cramping relieved with defecation.  Consistent with spasm. 3.  GERD with history of peptic stricture.  Asymptomatic post dilation on PPI 4.  Low back pain with history of kidney stones. 5.  Multiple medical problems   PLAN:  1.  Continue Metamucil 2 tablespoons daily 2.  Reflux precautions 3.  Continue PPI 4.  Obtain urinalysis 5.  Routine GI follow-up 1 year 6.  Surveillance colonoscopy 2031 A total time of 30 minutes was spent preparing to see the patient, reviewing test, obtaining interval history, performing medically appropriate physical exam, counseling the patient and his wife regarding the above listed issues, ordering laboratory testing, and documenting clinical information in the health record

## 2021-03-06 NOTE — Patient Instructions (Signed)
If you are age 72 or older, your body mass index should be between 23-30. Your Body mass index is 24.99 kg/m. If this is out of the aforementioned range listed, please consider follow up with your Primary Care Provider.  If you are age 65 or younger, your body mass index should be between 19-25. Your Body mass index is 24.99 kg/m. If this is out of the aformentioned range listed, please consider follow up with your Primary Care Provider.   ________________________________________________________  The [place] [facility] providers would like to encourage you to use [facility] to communicate with providers for non-urgent requests or questions.  Due to long hold times on the telephone, sending your provider a message by [facility] may be a faster and more efficient way to get a response.  Please allow 48 business hours for a response.  Please remember that this is for non-urgent requests.  _______________________________________________________  Your provider has requested that you go to the basement level for lab work before leaving today. Press "B" on the elevator. The lab is located at the first door on the left as you exit the elevator.

## 2021-03-07 DIAGNOSIS — Z5181 Encounter for therapeutic drug level monitoring: Secondary | ICD-10-CM | POA: Diagnosis not present

## 2021-03-07 LAB — URINALYSIS, ROUTINE W REFLEX MICROSCOPIC
Bilirubin Urine: NEGATIVE
Ketones, ur: NEGATIVE
Leukocytes,Ua: NEGATIVE
Nitrite: NEGATIVE
Specific Gravity, Urine: 1.02 (ref 1.000–1.030)
Total Protein, Urine: NEGATIVE
Urine Glucose: NEGATIVE
Urobilinogen, UA: 0.2 (ref 0.0–1.0)
pH: 6 (ref 5.0–8.0)

## 2021-03-15 ENCOUNTER — Ambulatory Visit: Payer: PPO | Admitting: Internal Medicine

## 2021-03-15 ENCOUNTER — Other Ambulatory Visit: Payer: Self-pay

## 2021-03-15 ENCOUNTER — Other Ambulatory Visit (INDEPENDENT_AMBULATORY_CARE_PROVIDER_SITE_OTHER): Payer: PPO

## 2021-03-15 ENCOUNTER — Ambulatory Visit (INDEPENDENT_AMBULATORY_CARE_PROVIDER_SITE_OTHER): Payer: PPO | Admitting: Internal Medicine

## 2021-03-15 ENCOUNTER — Encounter: Payer: Self-pay | Admitting: Internal Medicine

## 2021-03-15 VITALS — BP 118/76 | HR 71 | Temp 97.8°F | Ht 69.0 in | Wt 168.0 lb

## 2021-03-15 DIAGNOSIS — J84112 Idiopathic pulmonary fibrosis: Secondary | ICD-10-CM

## 2021-03-15 DIAGNOSIS — R634 Abnormal weight loss: Secondary | ICD-10-CM | POA: Diagnosis not present

## 2021-03-15 DIAGNOSIS — R053 Chronic cough: Secondary | ICD-10-CM

## 2021-03-15 DIAGNOSIS — K521 Toxic gastroenteritis and colitis: Secondary | ICD-10-CM | POA: Diagnosis not present

## 2021-03-15 DIAGNOSIS — Z5181 Encounter for therapeutic drug level monitoring: Secondary | ICD-10-CM

## 2021-03-15 LAB — PULMONARY FUNCTION TEST
DL/VA % pred: 74 %
DL/VA: 3 ml/min/mmHg/L
DLCO cor % pred: 38 %
DLCO cor: 9.54 ml/min/mmHg
DLCO unc % pred: 38 %
DLCO unc: 9.54 ml/min/mmHg
FEF 25-75 Pre: 1.04 L/sec
FEF2575-%Pred-Pre: 44 %
FEV1-%Pred-Pre: 46 %
FEV1-Pre: 1.44 L
FEV1FVC-%Pred-Pre: 91 %
FEV6-%Pred-Pre: 54 %
FEV6-Pre: 2.15 L
FEV6FVC-%Pred-Pre: 106 %
FVC-%Pred-Pre: 50 %
FVC-Pre: 2.15 L
Pre FEV1/FVC ratio: 67 %
Pre FEV6/FVC Ratio: 100 %

## 2021-03-15 LAB — CBC WITH DIFFERENTIAL/PLATELET
Basophils Absolute: 0 10*3/uL (ref 0.0–0.1)
Basophils Relative: 0.3 % (ref 0.0–3.0)
Eosinophils Absolute: 0.1 10*3/uL (ref 0.0–0.7)
Eosinophils Relative: 1.1 % (ref 0.0–5.0)
HCT: 43.6 % (ref 39.0–52.0)
Hemoglobin: 14.3 g/dL (ref 13.0–17.0)
Lymphocytes Relative: 17.3 % (ref 12.0–46.0)
Lymphs Abs: 1.3 10*3/uL (ref 0.7–4.0)
MCHC: 32.9 g/dL (ref 30.0–36.0)
MCV: 98.1 fl (ref 78.0–100.0)
Monocytes Absolute: 0.6 10*3/uL (ref 0.1–1.0)
Monocytes Relative: 8.6 % (ref 3.0–12.0)
Neutro Abs: 5.3 10*3/uL (ref 1.4–7.7)
Neutrophils Relative %: 72.7 % (ref 43.0–77.0)
Platelets: 244 10*3/uL (ref 150.0–400.0)
RBC: 4.44 Mil/uL (ref 4.22–5.81)
RDW: 15.3 % (ref 11.5–15.5)
WBC: 7.2 10*3/uL (ref 4.0–10.5)

## 2021-03-15 LAB — BASIC METABOLIC PANEL
BUN: 11 mg/dL (ref 6–23)
CO2: 34 mEq/L — ABNORMAL HIGH (ref 19–32)
Calcium: 8.9 mg/dL (ref 8.4–10.5)
Chloride: 101 mEq/L (ref 96–112)
Creatinine, Ser: 1.08 mg/dL (ref 0.40–1.50)
GFR: 69.15 mL/min (ref >60.00)
Glucose, Bld: 78 mg/dL (ref 70–99)
Potassium: 4 mEq/L (ref 3.5–5.1)
Sodium: 139 mEq/L (ref 135–145)

## 2021-03-15 LAB — HEPATIC FUNCTION PANEL
ALT: 19 U/L (ref 0–53)
AST: 34 U/L (ref 0–37)
Albumin: 3.9 g/dL (ref 3.5–5.2)
Alkaline Phosphatase: 81 U/L (ref 39–117)
Bilirubin, Direct: 0.1 mg/dL (ref 0.0–0.3)
Total Bilirubin: 0.7 mg/dL (ref 0.2–1.2)
Total Protein: 6.8 g/dL (ref 6.0–8.3)

## 2021-03-15 NOTE — Progress Notes (Signed)
Spirometry/DLCO performed today. 

## 2021-03-15 NOTE — Progress Notes (Signed)
Primary Care Provider: Lujean Amel, MD Cardiologist: Glenetta Hew, MD Electrophysiologist: None  Pulmonologist: Dr. Chase Caller GI: Dr. Henrene Pastor Urology: Dr. Serita Butcher Neuro: Dr. Jannifer Franklin Psych: Dr. Carolee Rota 01/29/2018  Chief Complaint  Patient presents with   Pulmonary Consult    self referral, abnormal CT, ? ILD from CT scan, SOB with exertion     Eduardo Fuentes presents self-referral for interstitial lung disease.  His wife Divit Stipp used to be seen by me some years ago when she had surgical lung biopsy for ruling out interstitial lung disease which we did.  Patient is a non-smoker but has long suffered from acid reflux and esophageal stenosis.  He also has a sliding hiatal hernia.  He says since spring 2019 he has had insidious onset of shortness of breath that is progressive associated with some cough.  During this time he is also had worsening endogenous depression.  He was seeing a psychiatrist multiple antidepressants have failed.  Electroconvulsive therapy has been recommended and he had one on January 27, 2018 2 days ago.  During this time a chest x-ray was done as part of pre-electroconvulsive therapy evaluation and this suggested interstitial lung disease.  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  I agree with the radiologist.  The some amount of mediastinal adenopathy that is felt to be reactive.  There is also a sliding hiatal hernia.  It is described that these findings are new compared to 2014 chest x-ray.  At this point in time walking desaturation test he did not desaturate but dropped 4 points and pulse ox    [facility] Integrated Comprehensive ILD Questionnaire  Symptoms: He reports shortness of breath onset gradually for the last 1 year.  It is getting worse with time.  He does have episodic dyspnea.  When he does shopping it is level 1.  When he walks on a level ground  with others of his age at this level 1 walking up stairs at this level 1 but walking at his own pace at this level 2 and taking a shower at level 3.  He does have associated arthralgia and cough.  He is not sure when the cough started.  It is present at all positions since it started it is the same as moderate in intensity.  He does cough at night.  He does bring up phlegm which is clear in color.  It gets worse when he lies down and it affects his voice and he does clear his throat.  He does not feel a tickle.  There is no wheezing.   Past Medical History : This is positive for major depression but he circled no for asthma, COPD.  Surgical no for heart failure.  Surgical no for collagen vascular disease with sleep apnea.  He did not answer for acid reflux which he does have..  Denies diabetes or thyroid disease or heart disease or pulmonary embolism or pneumonia   ROS:  .  Positive for difficulty swallowing due to his esophageal stricture history.  Does have nausea and acid reflux and snoring.  Otherwise no vomiting is positive for soreness across the body after electroconvulsive therapy 2 days ago.   FAMILY HISTORY of LUNG DISEASE: Denies family history of any pulmonary fibrosis COPD or asthma sarcoidosis   EXPOSURE HISTORY: Denies smoking cigarettes or tobacco or marijuana or cocaine.  No intravenous drug abuse.  HOME and HOBBY DETAILS : He did not list the age of the home but is lived in this home for 25 [place] setting.  There is no mold in the house of mildew.  The house is not damp.  Does not use a CPAP mask.  No nebulizer machine.  No use of Jacuzzi.  Nose fountain in the house.  No pet birds or gerbils.  Does not use feather pillows.  He is not sure if there is mold in the [facility] duct system.  Does not play wind instruments.  He occasionally mows the lawn   OCCUPATIONAL HISTORY (122 questions) : Positive for social gardening.  Did report that he is used feather pillows and blankets  in the past but otherwise negative including working in a dusty environment   [place] (27 items): Positive for cancer chemotherapy but he does not know the details      OV 03/16/2018  Subjective:  Patient ID: Eduardo Fuentes, male , DOB: Apr 29, 1950 , age 71 y.o. , MRN: [id number] , ADDRESS: [place] [address]   03/16/2018 -   Chief Complaint  Patient presents with   Follow-up    Pt started OFEV 02/27/2018.  Pt states he has been doing okay since last visit. Denies any current complaints with the OFEV. Pt states he does have SOB which is mainy with activities.   IPF [clinical diagnosis based on classic UIP scan negative serology and age and male gender; diagnosis given February 11, 2017]  HPI Eduardo Fuentes 71 y.o. -IPF follow-up.  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  He was started on nintedanib.  He started this on February 27, 2018.  Suffice tolerating it well without any problems.  He is debating about starting pulmonary rehabilitation.  Mostly does not want to pay $20 a week for this.  The other alternative is that he can go to [facility] [employer] and exercise by himself without any co-pay but there is no trainer.  I asked him to look at rehab as a short-term thing with the help of a trainer and the cost of a trainer being $20 a week.  He then opened up to the idea of attending pulmonary rehabilitation.  He still and his wife both had questions about IPF and etiology.  Be on sliding hiatal hernia but not able to discover anything else.  His reflux is better controlled now with PPI.  In terms of her shortness of breath this is stable.  In terms of his cough this is stable.  He continues to deal with depression but is not suicidal or homicidal.  His walking desaturation test today shows stability.  Wife wanted some education material about IPF and nintedanib.         OV 04/29/2018  Subjective:  Patient ID: Eduardo Fuentes, male , DOB:  Jan 30, 1951 , age 26 y.o. , MRN: [id number] , ADDRESS: [place] [address]   04/29/2018 -   Chief Complaint  Patient presents with   Follow-up    Pt states he has had some weakness going on which he states could be due to not eating enough with the OFEV. Pt also has had some loose stools as well as nausea.    IPF [clinical diagnosis based on classic UIP scan negative serology and age and male gender; diagnosis given February 11, 2017 and on Ofev since 02/27/2018   HPI COSTANTINO KOHLBECK 71 y.o. -  presents for follow-up of his IPF.  He is now doing nintedanib for 2 months.  Overall stable.  Overall tolerating nintedanib really well.  He had some low appetite.  There.  Some nausea he had and that yesterday he had some diarrhea after eating pizza.  He thinks it is a pizza that caused the diarrhea not the nintedanib.  In terms of his dyspnea he stable quality of life is stable.  He said he is doing well with pulmonary rehabilitation.  Maintain his pulse ox 90% or so.  He is asking if the nintedanib is helping him in terms of disease stability.  Discussed that this might not be known fully in an individual.  His mood is better   Results for JAEVIN, MEDEARIS (MRN [id number]) as of 03/16/2018 14:30  Ref. Range 01/29/2018 12:42  ASPERGILLUS FUMIGATUS Latest Ref Range: NEGATIVE  NEGATIVE  Pigeon Serum Latest Ref Range: NEGATIVE  NEGATIVE  Anti Nuclear Antibody(ANA) Latest Ref Range: NEGATIVE  POSITIVE (A)  ANA Pattern 1 Unknown Nuclear, Homogeneous (A)  ANA Titer 1 Latest Units: titer 1:74 (H)  Cyclic Citrullin Peptide Ab Latest Units: UNITS <16  RA Latex Turbid. Latest Ref Range: <14 IU/mL <14   ROS - per HPI  Results for SEWELL, PITNER (MRN [id number]) as of 03/16/2018 14:54  Ref. Range 03/08/2018 11:50  AST Latest Ref Range: 0 - 37 U/L 23  ALT Latest Ref Range: 0 - 53 U/L 15  Total Protein Latest Ref Range: 6.0 - 8.3 g/dL 7.2    ROS - per HPI    OV 04/01/2019  Subjective:   Patient ID: Eduardo Fuentes, male , DOB: 1950-09-10 , age 55 y.o. , MRN: [id number] , ADDRESS: [place] [address]   04/01/2019 -   Chief Complaint  Patient presents with   Follow-up    Pt states hes been feeling ok but has had some nausea after breakfast and morning nap. Very little cough. SOB on exertion. Pt denies any wheezing, fever, or chills.    IPF [clinical diagnosis based on classic UIP scan negative serology and age and male gender; diagnosis given February 11, 2017 and on Ofev since 02/27/2018  HPI HASON OFARRELL 71 y.o. -on this telephone visit reports nausea with nintedanib in the mornings.  He has a very light breakfast that has applesauce.  He does not eat much then after the breakfast at 9:00 he has some nausea that is mild to moderate in intensity.  Documented below with a question elicited a few days ago.  In terms of shortness of breath he is stable.  He is asking about exercising.  Otherwise he feels good.  At night only occasionally has nausea.  He has enough refills of Zofran but is not taking it.  I saw him a few days ago when he was in the office with his wife and at that time unofficial lung exam showed stable crackles and he looked well.  There is no evidence of ongoing depression or cognitive ability.  His memory is very sharp.     IMPRESSION: 1. The appearance of the lungs is compatible with interstitial lung disease, with a spectrum of findings considered probable usual interstitial pneumonia (UIP) per current ATS guidelines. No significant progression of disease compared to the prior study. 2. Aortic atherosclerosis, in addition to left main and 3 vessel coronary artery disease. Please note that although the presence of coronary artery calcium documents the presence of coronary artery disease, the  severity of this disease and any potential stenosis cannot be assessed on this non-gated CT examination. Assessment for potential risk factor  modification, dietary therapy or pharmacologic therapy may be warranted, if clinically indicated.   Aortic Atherosclerosis (ICD10-I70.0).     Electronically Signed   By: Vinnie Langton M.D.   On: 12/08/2018 15:10 ROS - per HPI  Results for JACQUELINE, DELAPENA (MRN [id number]) as of 04/01/2019 09:43  Ref. Range 01/17/2019 12:25  AST Latest Ref Range: 0 - 37 U/L 27  ALT Latest Ref Range: 0 - 53 U/L 21     OV 07/20/2019  Subjective:  Patient ID: Eduardo Fuentes, male , DOB: [birth year] , age 65 y.o. , MRN: [id number] , ADDRESS: [place] [address]  IPF [clinical diagnosis based on classic UIP scan negative serology and age and male gender; diagnosis given February 11, 2017 and on Ofev since 02/27/2018   07/20/2019 -   Chief Complaint  Patient presents with   Follow-up    pt is here to go over pft results. pt states nausea with the ofev mes     HPI NHAT HEARNE 71 y.o. -was stable at the time of last visit in February 2021. Because of coronary artery calcification I referred him to cardiology. Reviewed Dr. Shanon Brow Harding's notes. No indication for stress test. At that visit he had some nausea that was moderate. He then saw gastroenterologist Dr. Scarlette Shorts. Early in May 2021 this month he underwent upper endoscopy and lower endoscopy. I have reviewed the notes and visualize the results. These are normal. The nausea is felt to be due to drug. He is got moderate nausea although the dyspnea itself is same. He feels it is because of nintedanib. In fact he is also lost weight over 10 pounds since the last 1 year. He is not sure why. Nevertheless he is quite active. From a dyspnea disease standpoint he feels the same but his pulmonary function test shows decline of at least 6% and FVC and 13% and DLCO and since November 2020. He is worried about this.       OV 11/01/2019   Subjective:  Patient ID: Eduardo Fuentes, male , DOB: 1950/10/02, age 16 y.o. years. , MRN: [id number],   ADDRESS: [place] [address] PCP  Lujean Amel, MD Providers : Treatment Team:  Attending Provider: Brand Males, MD   Chief Complaint  Patient presents with   Follow-up    get pft results.  cough worse,dry cough.     IPF [clinical diagnosis based on classic UIP scan negative serology and age and male gender; diagnosis given February 11, 2017 and on Ofev since 02/27/2018.  Stopped nintedanib May 2021.  Started pirfenidone approximately June 3 or July 29, 2019.  Last CT scan of the chest mid October 2020    HPI REIGN BARTNICK 71 y.o. -presents for follow-up.  At last visit when he saw me face-to-face in May 2020 when I switched his nintedanib to pirfenidone.  This because of nausea and GI side effects and weight loss and also decline in lung function.  Since early June 2020 when he has been on pirfenidone.  He has some nausea with this but is well controlled with Zofran.  He stopped losing weight.  His weight is stable.  He does feel his cough at night is slightly worse than usual but otherwise his shortness of breath and activities are good.  He works out in [employer]  wearing a mask.  He has had his Covid vaccine.  He has no change in his effort tolerance.  His last CT scan of the chest was in mid October 2020 and his ILD was stable.  He is asking about repeating another CT chest.  He is due for liver function test today.  He also wants to have a walk test today.     OV 02/09/2020  Subjective:  Patient ID: ADVIK WEATHERSPOON, male , DOB: [birth year] , age 89 y.o. , MRN: [id number] , ADDRESS: [place]  [id number] PCP Lujean Amel, MD Patient Care Team: Lujean Amel, MD as PCP - General (Family Medicine) Leonie Man, MD as PCP - Cardiology (Cardiology)  This Provider for this visit: Treatment Team:  Attending Provider: Brand Males, MD      02/09/2020 -   Chief Complaint  Patient presents with   Follow-up    ILD, IPF, doing well      HPI Eduardo Fuentes 71 y.o. -returns for follow-up. After last visit he called in with progressive weight loss and stopped his pirfenidone. Significant GI side effects and worsening weight loss.  some 2 weeks ago he stopped his pirfenidone. After that he has gained weight. His GI side effects have resolved. His wife is here with him. She says he was miserable with pirfenidone. On his CT scan of the chest his last 1 year CT scan shows stability. His recent PFTs also show stability. I did express to him that the suggested antifibrotic was likely being effective although he was extremely miserable. He wanted know his future options. Discussed both antifibrotic's of the two approved standard of care medications and his significant side effects with both of them. After this he expressed interest to rechallenge with nintedanib. I did indicate to him that there was high probability that he would have side effects again with nintedanib. He told me that side effects with pirfenidone was significantly worse. And that with nintedanib it wasn't that significant. He definitely wants to rechallenge himself with nintedanib. I told him I would support that and will closely monitor. He then wanted to ask about other care options. We discussed the potential for research clinical trials as a care option. Explained to him that given the fact that he has had overall progressive disease but recent stability but now intolerant to both antifibrotic that he should look at advanced phase 3 trial. Explained that there are two options both IV infusions one is called to starscape study by [place]. The other is Pamrevulmab by  [employer] which is a monoclonal antibody against connective tissue growth factor. We discussed several aspects of clinical trials. Explained to him that the known side effect profile and safety profile of this drug would be in his best interest. Explained that he could try to expedite therapeutic option with  these trials but the primary purpose would be of human volunteer. We discussed the below         CT Chest data  CT Chest High Resolution  Result Date: 02/07/2020 CLINICAL DATA:  Follow-up interstitial lung disease. EXAM: CT CHEST WITHOUT CONTRAST TECHNIQUE: Multidetector CT imaging of the chest was performed following the standard protocol without intravenous contrast. High resolution imaging of the lungs, as well as inspiratory and expiratory imaging, was performed. COMPARISON:  12/08/2018 high-resolution chest CT. FINDINGS: Cardiovascular: Normal heart size. No significant pericardial effusion/thickening. Three-vessel coronary atherosclerosis. Atherosclerotic nonaneurysmal thoracic aorta. Stable top-normal caliber main pulmonary artery (3.4 cm diameter). Mediastinum/Nodes: No  discrete thyroid nodules. Unremarkable esophagus. No axillary adenopathy. Mild paratracheal adenopathy up to the 1.5 cm on the right (series 2/image 45), stable. Stable mildly enlarged 1.1 cm subcarinal node (series 2/image 70). Stable mild AP window adenopathy up to 1.3 cm (series 2/image 52). Stable mild bilateral hilar adenopathy, poorly delineated on these noncontrast images. Lungs/Pleura: No pneumothorax. No pleural effusion. No acute consolidative airspace disease or lung masses. Stable solid 0.5 cm posterior left upper lobe nodule (series 3/image 69). No new significant pulmonary nodules. No significant lobular air trapping or evidence of tracheobronchomalacia on the expiration sequence. Extensive patchy confluent subpleural reticulation, septal thickening and ground-glass opacity throughout both lungs with associated moderate traction bronchiectasis, architectural distortion and volume loss. Mild basilar predominance to these findings. Mild honeycombing is present in the dependent lower lobes bilaterally. No compelling evidence of interval progression since 12/08/2018 CT. Findings appear slightly progressed since  01/20/2018 baseline chest CT. Upper abdomen: Subcentimeter hypodense right liver lesions are too small to characterize and are unchanged considered benign. Musculoskeletal: No aggressive appearing focal osseous lesions. Moderate thoracic spondylosis. IMPRESSION: 1. Spectrum of findings compatible with basilar predominant fibrotic interstitial lung disease with mild honeycombing and no compelling evidence of interval progression since 12/08/2018 CT. Findings appear slightly progressed since 01/20/2018 baseline chest CT. Findings are consistent with UIP per consensus guidelines: Diagnosis of Idiopathic Pulmonary Fibrosis: An Official ATS/ERS/JRS/ALAT Clinical Practice Guideline. [place], Iss 5, [phone], Oct 25 2016. 2. Stable mild mediastinal and bilateral hilar lymphadenopathy, most compatible with benign reactive adenopathy. 3. Three-vessel coronary atherosclerosis. 4. Aortic Atherosclerosis (ICD10-I70.0). Electronically Signed   By: Ilona Sorrel M.D.   On: 02/07/2020 13:50          OV 11/08/2020  Subjective:  Patient ID: Eduardo Fuentes, male , DOB: 1950-05-01 , age 72 y.o. , MRN: [id number] , ADDRESS: [place] [address] PCP Lujean Amel, MD Patient Care Team: Lujean Amel, MD as PCP - General (Family Medicine) Leonie Man, MD as PCP - Cardiology (Cardiology)  This Provider for this visit: Treatment Team:  Attending Provider: Brand Males, MD    11/08/2020 -   Chief Complaint  Patient presents with   Follow-up    PFT  performed today.  Pt states he has been doing okay since last visit. States breathing is about the same.    IPF [clinical diagnosis based on classic UIP scan negative serology and age and male gender; diagnosis given February 11, 2017   -  Ofev since 02/27/2018.  Stopped nintedanib May 2021 due to weight loss  - .  Started pirfenidone approximately June 3 or July 29, 2019. -> stopped nov 2021 due to weight loss (worse  than with ofev) and GI side effects   -> wrestart ofev 02/09/2020   Overall progressive between 2019 and 2021 but is stable in the last 1 year between October 2020 and November/December 2021 -> stable April 2022   HPI JAZON JIPSON 71 y.o. -returns for follow-up.  I personally saw him only the end of last year.  After that in spring 2022 I saw him for research visit.  He screened fail that study [facility] because of concomitant medication of Cardizem.  At this point in time he saying is stable.  He goes to the gym and walks 30 minutes few times a week.  He mows the yard.  He gets mild shortness of breath but he says this is all stable.  He really  has not noticed any decline in shortness of breath or cough.  He is also not feeling his weight loss although objectively he has lost weight.  He feels he is tolerating the nintedanib quite well.  On walking desaturation test he dropped 8 points.  This is more than before.  I asked him about the symptoms many different ways but overall it seems like symptom wise he is stable.  He is interested in looking at another clinical trial but wants to ensure that he will be able to continue standard of care therapy.  I have told him that surgical transplant research trials.       IMPRESSION: HRCT APril 2022 - research 1. Spectrum of findings compatible with fibrotic interstitial lung disease with mild honeycombing and slight basilar predominance. No appreciable interval progression since 02/07/2020 CT. Findings have progressed since baseline 01/20/2018 CT. Findings are consistent with UIP per consensus guidelines: Diagnosis of Idiopathic Pulmonary Fibrosis: An Official ATS/ERS/JRS/ALAT Clinical Practice Guideline. [place], Iss 5, [phone], Oct 25 2016. 2. Stable chronic mediastinal and bilateral hilar lymphadenopathy, most compatible with benign reactive adenopathy. 3. Three-vessel coronary atherosclerosis. 4. Aortic Atherosclerosis  (ICD10-I70.0).     Electronically Signed   By: Ilona Sorrel M.D.   On: 06/22/2020 08:46     OV 03/15/2021  Subjective:  Patient ID: Eduardo Fuentes, male , DOB: 1950/04/01 , age 67 y.o. , MRN: [id number] , ADDRESS: [place] [id number] PCP Lujean Amel, MD Patient Care Team: Lujean Amel, MD as PCP - General (Family Medicine) Leonie Man, MD as PCP - Cardiology (Cardiology)  This Provider for this visit: Treatment Team:  Attending Provider: Brand Males, MD    03/15/2021 -   Chief Complaint  Patient presents with   Follow-up    PFT performed today. Pt states his breathing is about the same since last visit. States that he is coughing a lot more than usual.     IPF [clinical diagnosis based on classic UIP scan negative serology and age and male gender; diagnosis given February 11, 2017   -  Ofev since 02/27/2018.  Stopped nintedanib May 2021 due to weight loss  - .  Started pirfenidone approximately June 3 or July 29, 2019. -> stopped nov 2021 due to weight loss (worse than with ofev) and GI side effects   -> wrestart ofev 02/09/2020   Overall progressive between 2019 and 2021 but is stable in the last 1 year between October 2020 and November/December 2021 -> stable April 2022 -> wose Jan 2023   HPI AYAD NIEMAN 71 y.o. -returns for follow-up.  He had his last spirometry in September 2022.  He had another spirometry now.  Shows a 9% decline in FVC in a matter of a few months.  But subjectively he tells me that he is doing stable.  His overall symptom score is 6 and the same.  He is working out quite well in [employer].  His main complaint is that he had some itching in his bilateral thigh anteriorly a few weeks ago this then suddenly turned into burning a week ago and but now it is improved.  He did not know why.  He also tells me that he is having low backache because he sleeps 13 hours a day and he also works out.  Other than that he was feeling  fine.  However I did indicate to him that his spirometry has declined.  I  also indicated to him that he has had weight loss of at least 20 pounds in 3 years and 10-11 pounds in the last 2 years.  He is surprised by this.  He does admit that the nintedanib reduces his appetite.  At first he said he did not have much diarrhea but later I saw him wearing diapers.  He tells me that he is having 5-6 bowel movements in the morning.  They are not as loose as they once were but is still significant.  I explained to him that the weight loss and the diarrhea probably nintedanib side effects.  He does not want to give up nintedanib because he feels it is beneficial even in the face of progression.  Otherwise if worries that the progression would be worse without the nintedanib.  Then he had another symptom as he was ready to leave.  He told me that his cough is actually worse in the last year given the marked to the level 2 out of 5.  He wanted know if he could take over-the-counter medications for the cough.  In essence -Progression in PFT - Weight loss and diarrhea due to nintedanib and some abdominal cramping as well.  He is not wanting to give up on nintedanib. - Also worsening cough -He is interested in clinical trial as a care option [previously screened fail for another study]        SYMPTOM SCALE - ILD 03/28/2019  07/20/2019 - 180# Stop pfev 11/01/2019 182# start esbriet in June 2021 02/09/2020 189# - stopped esbriet 01/24/31 due to weight loss 11/08/2020 175 03/15/2021 169#  O2 use ra ra    ra  Shortness of Breath 0 -> 5 scale with 5 being worst (score 6 If unable to do)       At rest 0 0  0 0 0  Simple tasks - showers, clothes change, eating, shaving 0 0  0 0 1  Household (dishes, doing bed, laundry) 1 1  1 1 1   Shopping 1 0  1 1 1   Walking level at own pace 1 1  1 1 1   Walking up Stairs 2 2  3 2 2   Total (30-36) Dyspnea Score 5 4  6 5 6   How bad is your cough? 1 1  1 1 2   How bad is your  fatigue 1 1  2 2 2   How bad is nausea 3 2  2 1 1   How bad is vomiting?  0 0  0 0 0  How bad is diarrhea? 1 1  1 2 2   How bad is anxiety? 1 1  1 1 2   How bad is depression 1 0  2 1 2        01/29/2018  03/16/2018 190# 04/29/2018 193# 07/20/2019 180# 11/01/2019 182# 02/09/2020 189# 11/08/2020 175# 03/15/2021   O2 used Room air rooom air Room air   room air  ra ra  Number laps completed 3 3 3    three     Comments about pace 250 feet x 3 laps on POD B at [employer] pace Normal pace   normal pace     Resting Pulse Ox/HR 100% and 89/min 100% and 110/min 96% and 93/min 97% and 93/min 100% and HR 90/min 97% and 80/,in 98% and 73 99% and 71  Final Pulse Ox/HR 96% and 109/min 97% and 118/min 91% and 107/min 90% and 110/min 93% and 106/minm 92% and 90/min 90% and 89 86% for  few seconds in iddl of 2nd lap -> never stpped 95$  Desaturated </= 88% no no no    no no  Desaturated <= 3% points Yes, 4poin Yes, 3 poitns Yes, 5 points Yes, 7 points Yes, 7 points Yes, 5 points Yes, 8 points Yes 5 points  Got Tachycardic >/= 90/min yes yes yes  yes yes no yes  Symptoms at end of test no Mild dyspnea Mild dyspnea   No dyspnea Very mild dyspnea Mild dyspnea  Miscellaneous comments no x x Likely worse stable stable Weight loss      PFT  PFT Results Latest Ref Rng & Units 03/15/2021 11/08/2020 11/01/2019 07/18/2019 01/17/2019 08/25/2018 02/11/2018  FVC-Pre L 2.15 2.38 2.75 2.65 2.82 2.69 2.90  FVC-Predicted Pre % 50 55 63 61 65 62 67  FVC-Post L - - - - - - 2.52  FVC-Predicted Post % - - - - - - 58  Pre FEV1/FVC % % 67 74 79 83 85 73 86  Post FEV1/FCV % % - - - - - - 70  FEV1-Pre L 1.44 1.77 2.18 2.20 2.40 1.95 2.50  FEV1-Predicted Pre % 46 56 68 69 75 61 78  FEV1-Post L - - - - - - 1.77  DLCO uncorrected ml/min/mmHg 9.54 15.56 16.68 12.67 14.84 14.69 -  DLCO UNC% % 38 61 65 50 58 57 -  DLCO corrected ml/min/mmHg 9.54 15.56 16.68 12.67 - - -  DLCO COR %Predicted % 38 61 65 50 - - -  DLVA  Predicted % 74 94 109 83 88 97 -  TLC L - - - - - - 3.41  TLC % Predicted % - - - - - - 50  RV % Predicted % - - - - - - 11       has a past medical history of Agatston CAC score, <100 (04/2018), Arthritis, Cough, Depressive disorder (10/2011), Diverticulosis, Diverticulosis of colon, Essential hypertension, Generalized anxiety disorder, GERD (gastroesophageal reflux disease), Hemorrhoid, Hiatal hernia, History of colon polyps, History of kidney stones, Idiopathic pulmonary fibrosis ([place]), Microscopic hematuria, Nocturia, Panic attacks, Prostate cancer ([place]) (07/2012), Sinus infection, and Tinnitus.   reports that he has never smoked. He has never used smokeless tobacco.  Past Surgical History:  Procedure Laterality Date   COLONOSCOPY  03/04/2013   ESOPHAGOGASTRODUODENOSCOPY     EXTRACORPOREAL SHOCK WAVE LITHOTRIPSY  YRS AGO   PROSTATE BIOPSY     RADIOACTIVE SEED IMPLANT N/A 02/02/2013   Procedure: RADIOACTIVE SEED IMPLANT;  Surgeon: Molli Hazard, MD;  Location: [facility];  Service: Urology;  Laterality: N/A;   TONSILLECTOMY AND ADENOIDECTOMY  AS CHILD   TRANSTHORACIC ECHOCARDIOGRAM  06/22/2020   LVEF 60-65%. No RWMA. Moderate Basal0-Septal Asymmetric LVH. Gr1 DD. Normal RV size & Fxn with normal RVP/RAP.  Mild AoV Sclerosis w/o Stensosis. Ao Root 40 mm (mild dilation)   UPPER GASTROINTESTINAL ENDOSCOPY  2019    Allergies  Allergen Reactions   Aleve [Naproxen Sodium] Anaphylaxis and Other (See Comments)    GI UPSET Patient does not remember anaphylaxis   Hyzaar [Losartan Potassium-Hctz] Other (See Comments)    Felt bad    Lisinopril Cough   Demerol [Meperidine] Rash         Immunization History  Administered Date(s) Administered   Fluad Quad(high Dose 65+) 12/30/2018   Influenza Inj Mdck Quad Pf 11/09/2017   Influenza Split 12/05/2005, 11/27/2010, 12/19/2011, 11/26/2012   Influenza, High Dose Seasonal PF 12/10/2020   Influenza,inj,Quad PF,6+ Mos  10/29/2016, 11/09/2017, 11/18/2018, 11/18/2019   Influenza,inj,quad, With Preservative 10/25/2013, 11/27/2014, 10/26/2015   PFIZER(Purple Top)SARS-COV-2 Vaccination 04/02/2019, 04/27/2019, 12/10/2019, 07/27/2020   Pneumococcal Conjugate-13 10/26/2015   Pneumococcal Polysaccharide-23 10/29/2016   Td 02/24/1998   Tdap 07/15/2011   Zoster Recombinat (Shingrix) 12/03/2018, 02/01/2019   Zoster, Live 03/19/2012    Family History  Problem Relation Age of Onset   Dementia Mother    Anxiety disorder Mother    Alzheimer's disease Mother    Bone cancer Father        Metastatic prostate   Cancer Father        prostate   Heart attack Father 48       Referred for CABG   Anxiety disorder Sister    Colon cancer Neg Hx    Esophageal cancer Neg Hx    Rectal cancer Neg Hx    Stomach cancer Neg Hx    Colon polyps Neg Hx      Current Outpatient Medications:    apixaban (ELIQUIS) 5 MG TABS tablet, Take 1 tablet (5 mg total) by mouth 2 (two) times daily., Disp: 28 tablet, Rfl: 0   clonazePAM (KLONOPIN) 1 MG tablet, Take 1 tablet at 8 am, 1/2 tablet at 2 pm and 1 tablet at bedtime for anxiety., Disp: 75 tablet, Rfl: 0   diltiazem (CARDIZEM CD) 120 MG 24 hr capsule, TAKE 1 CAPSULE(120 MG) BY MOUTH DAILY, Disp: 30 capsule, Rfl: 6   gabapentin (NEURONTIN) 300 MG capsule, Take 600 mg by mouth 2 (two) times daily. , Disp: , Rfl:    mirtazapine (REMERON) 30 MG tablet, Take 30 mg by mouth at bedtime. , Disp: , Rfl: 4   Nintedanib (OFEV) 150 MG CAPS, TAKE 1 CAPSULE (150 MG TOTAL) BY MOUTH 2 (TWO) TIMES DAILY., Disp: 120 capsule, Rfl: 11   ondansetron (ZOFRAN) 4 MG tablet, TAKE 1 TO 2 TABLETS BY MOUTH WITH OFEV (Patient taking differently: PRN), Disp: 120 tablet, Rfl: 0   pantoprazole (PROTONIX) 40 MG tablet, TAKE 1 TABLET(40 MG) BY MOUTH DAILY, Disp: 90 tablet, Rfl: 3   psyllium (METAMUCIL) 58.6 % powder, Take 1 packet by mouth as needed., Disp: , Rfl:    rosuvastatin (CRESTOR) 20 MG tablet, Take 1 tablet  (20 mg total) by mouth daily., Disp: 90 tablet, Rfl: 3  Current Facility-Administered Medications:    0.9 %  sodium chloride infusion, 500 mL, Intravenous, Once, Irene Shipper, MD   triamcinolone acetonide (KENALOG) 10 MG/ML injection 10 mg, 10 mg, Other, Once, Wallene Huh, DPM      Objective:   Vitals:   03/15/21 1331  BP: 118/76  Pulse: 71  Temp: 97.8 F (36.6 C)  TempSrc: Oral  SpO2: 99%  Weight: 168 lb (76.2 kg)  Height: 5\' 9"  (1.753 m)    Estimated body mass index is 24.81 kg/m as calculated from the following:   Height as of this encounter: 5\' 9"  (1.753 m).   Weight as of this encounter: 168 lb (76.2 kg).  @WEIGHTCHANGE @  [employer]   03/15/21 1331  Weight: 168 lb (76.2 kg)     Physical Exam    General: No distress. Looks well though leander Neuro: Alert and Oriented x 3. GCS 15. Speech normal Psych: Pleasant Resp:  Barrel Chest - no.  Wheeze - no, Crackles - yes, No overt respiratory distress CVS: Normal heart sounds. Murmurs - no Ext: Stigmata of Connective Tissue Disease - no HEENT: Normal upper airway. PEERL +. No post nasal drip  Assessment:       ICD-10-CM   1. IPF (idiopathic pulmonary fibrosis) (HCC)  J84.112 Hepatic function panel    Basic metabolic panel    CBC with Differential/Platelet    GI pathogen panel by PCR, stool    Ova and parasite examination    ECHOCARDIOGRAM COMPLETE    2. Encounter for therapeutic drug monitoring  Z51.81     3. Drug-induced weight loss  R63.4    T50.905A     4. Drug-induced diarrhea  K52.1 GI pathogen panel by PCR, stool    Ova and parasite examination    5. Chronic cough  R05.3           Plan:     Patient Instructions     ICD-10-CM   1. IPF (idiopathic pulmonary fibrosis) ([place])  J84.112     2. Encounter for therapeutic drug monitoring  Z51.81     3. Drug-induced weight loss  R63.4    T50.905A     4. Drug-induced diarrhea  K52.1     5. Chronic cough  R05.3        Subjectively stable but pulmonary function test shows continued worsening  20# weight loss in 3 years and 11#  weight loss in 2 years  Also significant diarrhea with ofev  Cough worse over time    Plan - Check liver function test, chemistry and CBC 03/15/2021 - give stool study for GI pathogen PCR and ova/parasite - stop ofev for 1 week and then restart at 1 pill once daily for 1 weeek and then 1 pill twice daily --We will ask the research team to evaluate you further clinical trials as a care option  -But it could be a 1-2 months before they reach out to you - do echocardiogram next few weeks - try OTC cough medications  Follow-up - Return in 3-4 weeks to see APP  to check on weigh and diarrhea  - follow cough control at next visit - in 3 months do spirometry and dlco again - in 3 month return to see   Dr. Chase Caller but after breathing test  - 30 min visit  -ILD symptom score and simple walking desaturation test at follow-up  Need to intensively monitor him and follow-up.  He is getting worse.  SIGNATURE    Dr. Brand Males, M.D., F.C.C.P,  Pulmonary and Critical Care Medicine Staff Physician, [place] Director - Interstitial Lung Disease  Program  [facility] [place], [place], [address]  Pager: [phone], If no answer or between  15:00h - 7:00h: call [phone] Telephone: [phone]  2:40 PM 03/15/2021

## 2021-03-15 NOTE — Patient Instructions (Addendum)
ICD-10-CM   1. IPF (idiopathic pulmonary fibrosis) ([place])  J84.112     2. Encounter for therapeutic drug monitoring  Z51.81     3. Drug-induced weight loss  R63.4    T50.905A     4. Drug-induced diarrhea  K52.1     5. Chronic cough  R05.3       Subjectively stable but pulmonary function test shows continued worsening  20# weight loss in 3 years and 11#  weight loss in 2 years  Also significant diarrhea with ofev  Cough worse over time    Plan - Check liver function test, chemistry and CBC 03/15/2021 - give stool study for GI pathogen PCR and ova/parasite - stop ofev for 1 week and then restart at 1 pill once daily for 1 weeek and then 1 pill twice daily --We will ask the research team to evaluate you further clinical trials as a care option  -But it could be a 1-2 months before they reach out to you - do echocardiogram next few weeks - try OTC cough medications  Follow-up - Return in 3-4 weeks to see APP  to check on weigh and diarrhea  - follow cough control at next visit - in 3 months do spirometry and dlco again - in 3 month return to see   Dr. Chase Caller but after breathing test  - 30 min visit  -ILD symptom score and simple walking desaturation test at follow-up

## 2021-03-15 NOTE — Patient Instructions (Signed)
Spirometry/DLCO performed today. 

## 2021-03-21 ENCOUNTER — Other Ambulatory Visit: Payer: PPO

## 2021-03-21 DIAGNOSIS — K521 Toxic gastroenteritis and colitis: Secondary | ICD-10-CM

## 2021-03-21 DIAGNOSIS — J84112 Idiopathic pulmonary fibrosis: Secondary | ICD-10-CM | POA: Diagnosis not present

## 2021-03-24 LAB — GI PROFILE, STOOL, PCR

## 2021-03-25 ENCOUNTER — Telehealth: Payer: Self-pay

## 2021-03-25 MED ORDER — CIPROFLOXACIN HCL 500 MG PO TABS: 500.0000 mg | ORAL_TABLET | Freq: Two times a day (BID) | ORAL | 0 refills | Status: DC

## 2021-03-25 NOTE — Telephone Encounter (Signed)
When I saw him on 11 January he was not having diarrhea. It looks like the stool study was ordered by pulmonary. Contact the patient.  If he is not having issues with diarrhea or fever, then nothing needs to be done. If he is having issues with diarrhea, then treat with Cipro 500 mg twice daily for 5 days

## 2021-03-25 NOTE — Telephone Encounter (Signed)
Enteropathogenic E coli Not Detected Detected Abnormal    See stool GI profile Call from [place] to alert to the results.

## 2021-03-25 NOTE — Telephone Encounter (Signed)
Spoke with pt and he states he has been having loose stools off and on. Script sent to pharmacy. Pt aware.

## 2021-03-25 NOTE — Telephone Encounter (Signed)
Patient called requesting to speak with regarding the message below.

## 2021-03-25 NOTE — Telephone Encounter (Signed)
Pt states he was half asleep when we spoke earlier. He wanted to know again what the results/message/plan were and how he could have gotten e coli. Reviewed information with pt and questions were answered.

## 2021-03-26 LAB — OVA AND PARASITE EXAMINATION
CONCENTRATE RESULT:: NONE SEEN
MICRO NUMBER:: 12923945
SPECIMEN QUALITY:: ADEQUATE
TRICHROME RESULT:: NONE SEEN

## 2021-03-27 ENCOUNTER — Other Ambulatory Visit: Payer: Self-pay

## 2021-03-27 ENCOUNTER — Ambulatory Visit (HOSPITAL_COMMUNITY): Payer: PPO | Attending: Internal Medicine

## 2021-03-27 DIAGNOSIS — I3139 Other pericardial effusion (noninflammatory): Secondary | ICD-10-CM | POA: Insufficient documentation

## 2021-03-27 DIAGNOSIS — R0600 Dyspnea, unspecified: Secondary | ICD-10-CM | POA: Diagnosis not present

## 2021-03-27 DIAGNOSIS — R0609 Other forms of dyspnea: Secondary | ICD-10-CM | POA: Diagnosis not present

## 2021-03-27 DIAGNOSIS — I2510 Atherosclerotic heart disease of native coronary artery without angina pectoris: Secondary | ICD-10-CM | POA: Insufficient documentation

## 2021-03-27 DIAGNOSIS — J84112 Idiopathic pulmonary fibrosis: Secondary | ICD-10-CM | POA: Insufficient documentation

## 2021-03-27 DIAGNOSIS — I119 Hypertensive heart disease without heart failure: Secondary | ICD-10-CM | POA: Diagnosis not present

## 2021-03-27 DIAGNOSIS — E785 Hyperlipidemia, unspecified: Secondary | ICD-10-CM | POA: Diagnosis not present

## 2021-03-27 LAB — ECHOCARDIOGRAM COMPLETE
Area-P 1/2: 2.07 cm2
S' Lateral: 2.4 cm

## 2021-03-28 DIAGNOSIS — J84112 Idiopathic pulmonary fibrosis: Secondary | ICD-10-CM | POA: Diagnosis not present

## 2021-03-28 DIAGNOSIS — D6869 Other thrombophilia: Secondary | ICD-10-CM | POA: Diagnosis not present

## 2021-03-28 DIAGNOSIS — M549 Dorsalgia, unspecified: Secondary | ICD-10-CM | POA: Diagnosis not present

## 2021-03-28 DIAGNOSIS — R0981 Nasal congestion: Secondary | ICD-10-CM | POA: Diagnosis not present

## 2021-03-28 DIAGNOSIS — I4891 Unspecified atrial fibrillation: Secondary | ICD-10-CM | POA: Diagnosis not present

## 2021-03-28 DIAGNOSIS — R200 Anesthesia of skin: Secondary | ICD-10-CM | POA: Diagnosis not present

## 2021-03-28 DIAGNOSIS — I700 Atherosclerosis of aorta: Secondary | ICD-10-CM | POA: Diagnosis not present

## 2021-04-03 ENCOUNTER — Ambulatory Visit (HOSPITAL_COMMUNITY): Payer: PPO | Admitting: Physician Assistant

## 2021-04-03 ENCOUNTER — Ambulatory Visit (HOSPITAL_COMMUNITY)
Admission: RE | Admit: 2021-04-03 | Discharge: 2021-04-03 | Disposition: A | Discharge status: Home / Self Care | Payer: PPO | Source: Ambulatory Visit | Attending: Physician Assistant | Admitting: Physician Assistant

## 2021-04-03 ENCOUNTER — Other Ambulatory Visit: Payer: Self-pay

## 2021-04-03 ENCOUNTER — Encounter (HOSPITAL_COMMUNITY): Payer: Self-pay | Admitting: Physician Assistant

## 2021-04-03 VITALS — BP 138/82 | HR 72 | Ht 69.0 in | Wt 171.2 lb

## 2021-04-03 DIAGNOSIS — Z7901 Long term (current) use of anticoagulants: Secondary | ICD-10-CM | POA: Diagnosis not present

## 2021-04-03 DIAGNOSIS — I119 Hypertensive heart disease without heart failure: Secondary | ICD-10-CM | POA: Diagnosis not present

## 2021-04-03 DIAGNOSIS — Z79899 Other long term (current) drug therapy: Secondary | ICD-10-CM | POA: Insufficient documentation

## 2021-04-03 DIAGNOSIS — D6869 Other thrombophilia: Secondary | ICD-10-CM | POA: Diagnosis not present

## 2021-04-03 DIAGNOSIS — I2510 Atherosclerotic heart disease of native coronary artery without angina pectoris: Secondary | ICD-10-CM | POA: Insufficient documentation

## 2021-04-03 DIAGNOSIS — I480 Paroxysmal atrial fibrillation: Secondary | ICD-10-CM | POA: Diagnosis not present

## 2021-04-03 DIAGNOSIS — J849 Interstitial pulmonary disease, unspecified: Secondary | ICD-10-CM | POA: Insufficient documentation

## 2021-04-03 NOTE — Progress Notes (Signed)
Primary Care Physician: Lujean Amel, MD Primary Cardiologist: Dr Ellyn Hack Primary Electrophysiologist: none Referring Physician: Dr Leanne Lovely ED   Eduardo Fuentes is a 71 y.o. male with a history of ILD, HTN, CAD, and atrial fibrillation who presents for follow up in the [facility]. The patient was initially diagnosed with atrial fibrillation 05/03/20 after presenting to his PCP office for dizziness. He was sent to the ED for evaluation. He was rate controlled with diltiazem and started on Eliquis for a CHADS2VASC score of 3. Patient was back in SR at follow up with his PCP the next day. There did not appear to be any specific triggers that he could identify.   On follow up today, patient reports that he is doing reasonably well since his last visit. He denies any tachypalpitations. He does continue to have chronic dyspnea on exertion. No bleeding issues on anticoagulation.   Today, he denies symptoms of palpitations, chest pain, orthopnea, PND, lower extremity edema, dizziness, presyncope, syncope, snoring, daytime somnolence, bleeding, or neurologic sequela. The patient is tolerating medications without difficulties and is otherwise without complaint today.    Atrial Fibrillation Risk Factors:  he does not have symptoms or diagnosis of sleep apnea. he does not have a history of rheumatic fever. he does not have a history of alcohol use.   he has a BMI of Body mass index is 25.28 kg/m.Marland Kitchen Filed Weights   04/03/21 1431  Weight: 77.7 kg    Family History  Problem Relation Age of Onset   Dementia Mother    Anxiety disorder Mother    Alzheimer's disease Mother    Bone cancer Father        Metastatic prostate   Cancer Father        prostate   Heart attack Father 34       Referred for CABG   Anxiety disorder Sister    Colon cancer Neg Hx    Esophageal cancer Neg Hx    Rectal cancer Neg Hx    Stomach cancer Neg Hx    Colon polyps Neg Hx       Atrial Fibrillation Management history:  Previous antiarrhythmic drugs: none Previous cardioversions: none Previous ablations: none CHADS2VASC score: 3 Anticoagulation history: Eliquis   Past Medical History:  Diagnosis Date   Agatston CAC score, <100 04/2018   Arthritis    NECK   Cough    NON-PRODUCTIVE   Depressive disorder 10/2011   follow by dr [facility] psychiatry; has anxiety and panic disorder as well.   Diverticulosis    Diverticulosis of colon    Followed by Dr. Henrene Pastor   Essential hypertension    Generalized anxiety disorder    GERD (gastroesophageal reflux disease)    WATCHES DIET   Hemorrhoid    Hiatal hernia    Describes a sliding hiatal hernia   History of colon polyps    History of kidney stones    Idiopathic pulmonary fibrosis ([place])    Microscopic hematuria    Nocturia    Panic attacks    Prostate cancer ([place]) 07/2012   Dr. Arnell Sieving urology.   Sinus infection    Tinnitus    LEFT EAR -- CHRONIC   Past Surgical History:  Procedure Laterality Date   COLONOSCOPY  03/04/2013   ESOPHAGOGASTRODUODENOSCOPY     EXTRACORPOREAL SHOCK WAVE LITHOTRIPSY  YRS AGO   PROSTATE BIOPSY     RADIOACTIVE SEED IMPLANT N/A 02/02/2013   Procedure: RADIOACTIVE SEED IMPLANT;  Surgeon: Molli Hazard, MD;  Location: [facility];  Service: Urology;  Laterality: N/A;   TONSILLECTOMY AND ADENOIDECTOMY  AS CHILD   TRANSTHORACIC ECHOCARDIOGRAM  06/22/2020   LVEF 60-65%. No RWMA. Moderate Basal0-Septal Asymmetric LVH. Gr1 DD. Normal RV size & Fxn with normal RVP/RAP.  Mild AoV Sclerosis w/o Stensosis. Ao Root 40 mm (mild dilation)   UPPER GASTROINTESTINAL ENDOSCOPY  2019    Current Outpatient Medications  Medication Sig Dispense Refill   apixaban (ELIQUIS) 5 MG TABS tablet Take 1 tablet (5 mg total) by mouth 2 (two) times daily. 28 tablet 0   clonazePAM (KLONOPIN) 1 MG tablet Take 1 tablet at 8 am, 1/2 tablet at 2 pm and 1 tablet at bedtime for  anxiety. 75 tablet 0   diltiazem (CARDIZEM CD) 120 MG 24 hr capsule TAKE 1 CAPSULE(120 MG) BY MOUTH DAILY 30 capsule 6   gabapentin (NEURONTIN) 300 MG capsule Take 600 mg by mouth 2 (two) times daily.      mirtazapine (REMERON) 30 MG tablet Take 30 mg by mouth at bedtime.   4   Nintedanib (OFEV) 150 MG CAPS TAKE 1 CAPSULE (150 MG TOTAL) BY MOUTH 2 (TWO) TIMES DAILY. 120 capsule 11   ondansetron (ZOFRAN) 4 MG tablet TAKE 1 TO 2 TABLETS BY MOUTH WITH OFEV (Patient taking differently: PRN) 120 tablet 0   pantoprazole (PROTONIX) 40 MG tablet TAKE 1 TABLET(40 MG) BY MOUTH DAILY 90 tablet 3   psyllium (METAMUCIL) 58.6 % powder Take 1 packet by mouth as needed.     rosuvastatin (CRESTOR) 20 MG tablet Take 1 tablet (20 mg total) by mouth daily. 90 tablet 3   Current Facility-Administered Medications  Medication Dose Route Frequency Provider Last Rate Last Admin   0.9 %  sodium chloride infusion  500 mL Intravenous Once Irene Shipper, MD       triamcinolone acetonide (KENALOG) 10 MG/ML injection 10 mg  10 mg Other Once Regal, Norman S, DPM        Allergies  Allergen Reactions   Aleve [Naproxen Sodium] Anaphylaxis and Other (See Comments)    GI UPSET Patient does not remember anaphylaxis   Hyzaar [Losartan Potassium-Hctz] Other (See Comments)    Felt bad    Lisinopril Cough   Demerol [Meperidine] Rash         Social History   Socioeconomic History   Marital status: Married    Spouse name: [name]   Number of children: 0   Years of education: Not on file   Highest education level: Not on file  Occupational History   Occupation: retired  Tobacco Use   Smoking status: Never   Smokeless tobacco: Never  Vaping Use   Vaping Use: Never used  Substance and Sexual Activity   Alcohol use: No   Drug use: No   Sexual activity: Never  Other Topics Concern   Not on file  Social History Narrative   Pt lives in single story home with is wife   12th grade education   Retired Programmer, systems from  [employer] ;   Maybe 2-3 alcoholic beverages a month.  No caffeine.   Social Determinants of Health   Financial Resource Strain: Not on file  Food Insecurity: Not on file  Transportation Needs: Not on file  Physical Activity: Not on file  Stress: Not on file  Social Connections: Not on file  Intimate Partner Violence: Not on file     ROS- All systems  are reviewed and negative except as per the HPI above.  Physical Exam: Vitals:   04/03/21 1431  BP: 138/82  Pulse: 72  Weight: 77.7 kg  Height: 5\' 9"  (1.753 m)    GEN- The patient is a well appearing male, alert and oriented x 3 today.   HEENT-head normocephalic, atraumatic, sclera clear, conjunctiva pink, hearing intact, trachea midline. Lungs- Clear to ausculation bilaterally, normal work of breathing Heart- Regular rate and rhythm, no murmurs, rubs or gallops  GI- soft, NT, ND, + BS Extremities- no clubbing, cyanosis, or edema MS- no significant deformity or atrophy Skin- no rash or lesion Psych- euthymic mood, full affect Neuro- strength and sensation are intact   Wt Readings from Last 3 Encounters:  04/03/21 77.7 kg  03/15/21 76.2 kg  03/06/21 76.7 kg    EKG today demonstrates SR Vent. rate 72 BPM PR interval 166 ms QRS duration 82 ms QT/QTcB 372/407 ms  Echo 06/22/20 demonstrated 1. Left ventricular ejection fraction, by estimation, is 60 to 65%. The  left ventricle has normal function. The left ventricle has no regional  wall motion abnormalities. There is moderate asymmetric left ventricular hypertrophy of the basal-septal segment. Left ventricular diastolic parameters are consistent with Grade I  diastolic dysfunction (impaired relaxation).   2. Right ventricular systolic function is mildly reduced. The right  ventricular size is normal.   3. The mitral valve is normal in structure. Trivial mitral valve  regurgitation. No evidence of mitral stenosis.   4. The aortic valve is  tricuspid. Aortic valve regurgitation is not  visualized. Mild aortic valve sclerosis is present, with no evidence of  aortic valve stenosis.   5. Aortic dilatation noted. There is mild dilatation of the aortic root,  measuring 40 mm.   Epic records are reviewed at length today  CHA2DS2-VASc Score = 3  The patient's score is based upon: CHF History: 0 HTN History: 1 Diabetes History: 0 Stroke History: 0 Vascular Disease History: 1 Age Score: 1 Gender Score: 0       ASSESSMENT AND PLAN: 1. Paroxysmal atrial fibrillation The patient's CHA2DS2-VASc score is 3, indicating a 3.2% annual risk of stroke.   Patient appears to be maintaining SR. Continue Eliquis 5 mg BID Continue diltiazem 120 mg daily  2. Secondary Hypercoagulable State (ICD10:  D68.69) The patient is at significant risk for stroke/thromboembolism based upon his CHA2DS2-VASc Score of 3.  Continue Apixaban (Eliquis).   3. HTN Stable, no changes today.  4. CAD Noted on CT No anginal symptoms.   Follow up in the AF clinic in one year.    [facility] [address] [place], [place] [address] [phone] 04/03/2021 2:38 PM

## 2021-04-12 ENCOUNTER — Ambulatory Visit: Payer: PPO | Admitting: Nurse Practitioner

## 2021-04-25 ENCOUNTER — Other Ambulatory Visit (HOSPITAL_COMMUNITY): Payer: Self-pay | Admitting: *Deleted

## 2021-04-25 MED ORDER — APIXABAN 5 MG PO TABS: 5.0000 mg | ORAL_TABLET | Freq: Two times a day (BID) | ORAL | 3 refills | Status: AC

## 2021-05-08 DIAGNOSIS — J0140 Acute pansinusitis, unspecified: Secondary | ICD-10-CM | POA: Diagnosis not present

## 2021-05-20 ENCOUNTER — Other Ambulatory Visit: Payer: Self-pay | Admitting: Internal Medicine

## 2021-05-27 ENCOUNTER — Telehealth: Payer: Self-pay | Admitting: Internal Medicine

## 2021-05-27 NOTE — Telephone Encounter (Signed)
Front desk/[place] ? ?I was told he was dyspneic talking on phone. Therefore ? ?A) please get him to see ME (? Thur 05/30/21) or Beth or Tammy this week - will need simple walk test on room air and symptoms score ? ?B) keep the PFT appt 4/20 and  if I am not on [place] that day  - make appt iwht APP that week ?

## 2021-05-27 NOTE — Telephone Encounter (Signed)
ATC patient to be seen with MR this week, but unable to reach. Patient needs to see MR this week per MR breathing declining.   You can double book in a video visit slot on Wednesday. ?

## 2021-05-29 ENCOUNTER — Emergency Department (HOSPITAL_COMMUNITY): Payer: PPO

## 2021-05-29 ENCOUNTER — Encounter (HOSPITAL_COMMUNITY): Payer: Self-pay | Admitting: Emergency Medicine

## 2021-05-29 ENCOUNTER — Other Ambulatory Visit: Payer: Self-pay

## 2021-05-29 ENCOUNTER — Inpatient Hospital Stay (HOSPITAL_COMMUNITY): Payer: PPO

## 2021-05-29 ENCOUNTER — Telehealth: Payer: Self-pay | Admitting: Primary Care

## 2021-05-29 ENCOUNTER — Encounter: Payer: Self-pay | Admitting: Primary Care

## 2021-05-29 ENCOUNTER — Inpatient Hospital Stay (HOSPITAL_COMMUNITY)
Admission: EM | Admit: 2021-05-29 | Discharge: 2021-06-01 | DRG: 196 | Disposition: A | Discharge status: Home / Self Care | Payer: PPO | Attending: Internal Medicine | Admitting: Internal Medicine

## 2021-05-29 ENCOUNTER — Ambulatory Visit: Payer: PPO | Admitting: Primary Care

## 2021-05-29 VITALS — BP 118/76 | HR 68 | Temp 98.5°F | Ht 69.0 in | Wt 171.8 lb

## 2021-05-29 DIAGNOSIS — I5030 Unspecified diastolic (congestive) heart failure: Secondary | ICD-10-CM | POA: Insufficient documentation

## 2021-05-29 DIAGNOSIS — R778 Other specified abnormalities of plasma proteins: Secondary | ICD-10-CM

## 2021-05-29 DIAGNOSIS — N179 Acute kidney failure, unspecified: Secondary | ICD-10-CM | POA: Diagnosis not present

## 2021-05-29 DIAGNOSIS — Z79899 Other long term (current) drug therapy: Secondary | ICD-10-CM | POA: Diagnosis not present

## 2021-05-29 DIAGNOSIS — E785 Hyperlipidemia, unspecified: Secondary | ICD-10-CM | POA: Diagnosis not present

## 2021-05-29 DIAGNOSIS — I214 Non-ST elevation (NSTEMI) myocardial infarction: Secondary | ICD-10-CM | POA: Diagnosis present

## 2021-05-29 DIAGNOSIS — R0902 Hypoxemia: Principal | ICD-10-CM

## 2021-05-29 DIAGNOSIS — J9601 Acute respiratory failure with hypoxia: Secondary | ICD-10-CM | POA: Diagnosis not present

## 2021-05-29 DIAGNOSIS — Z8546 Personal history of malignant neoplasm of prostate: Secondary | ICD-10-CM | POA: Diagnosis not present

## 2021-05-29 DIAGNOSIS — Z20822 Contact with and (suspected) exposure to covid-19: Secondary | ICD-10-CM | POA: Diagnosis not present

## 2021-05-29 DIAGNOSIS — F32A Depression, unspecified: Secondary | ICD-10-CM | POA: Diagnosis not present

## 2021-05-29 DIAGNOSIS — Z66 Do not resuscitate: Secondary | ICD-10-CM | POA: Diagnosis not present

## 2021-05-29 DIAGNOSIS — R194 Change in bowel habit: Secondary | ICD-10-CM

## 2021-05-29 DIAGNOSIS — I472 Ventricular tachycardia, unspecified: Secondary | ICD-10-CM | POA: Diagnosis not present

## 2021-05-29 DIAGNOSIS — R0602 Shortness of breath: Secondary | ICD-10-CM

## 2021-05-29 DIAGNOSIS — R7989 Other specified abnormal findings of blood chemistry: Secondary | ICD-10-CM

## 2021-05-29 DIAGNOSIS — J84112 Idiopathic pulmonary fibrosis: Secondary | ICD-10-CM | POA: Diagnosis not present

## 2021-05-29 DIAGNOSIS — Z8249 Family history of ischemic heart disease and other diseases of the circulatory system: Secondary | ICD-10-CM | POA: Diagnosis not present

## 2021-05-29 DIAGNOSIS — Z818 Family history of other mental and behavioral disorders: Secondary | ICD-10-CM

## 2021-05-29 DIAGNOSIS — I482 Chronic atrial fibrillation, unspecified: Secondary | ICD-10-CM | POA: Diagnosis not present

## 2021-05-29 DIAGNOSIS — R079 Chest pain, unspecified: Secondary | ICD-10-CM | POA: Diagnosis not present

## 2021-05-29 DIAGNOSIS — M779 Enthesopathy, unspecified: Secondary | ICD-10-CM

## 2021-05-29 DIAGNOSIS — I2510 Atherosclerotic heart disease of native coronary artery without angina pectoris: Secondary | ICD-10-CM | POA: Diagnosis not present

## 2021-05-29 DIAGNOSIS — F411 Generalized anxiety disorder: Secondary | ICD-10-CM | POA: Diagnosis present

## 2021-05-29 DIAGNOSIS — I272 Pulmonary hypertension, unspecified: Secondary | ICD-10-CM | POA: Diagnosis present

## 2021-05-29 DIAGNOSIS — I493 Ventricular premature depolarization: Secondary | ICD-10-CM | POA: Diagnosis present

## 2021-05-29 DIAGNOSIS — J810 Acute pulmonary edema: Secondary | ICD-10-CM | POA: Diagnosis not present

## 2021-05-29 DIAGNOSIS — K219 Gastro-esophageal reflux disease without esophagitis: Secondary | ICD-10-CM | POA: Diagnosis not present

## 2021-05-29 DIAGNOSIS — Z888 Allergy status to other drugs, medicaments and biological substances status: Secondary | ICD-10-CM

## 2021-05-29 DIAGNOSIS — R5383 Other fatigue: Secondary | ICD-10-CM | POA: Diagnosis not present

## 2021-05-29 DIAGNOSIS — J8410 Pulmonary fibrosis, unspecified: Secondary | ICD-10-CM | POA: Diagnosis not present

## 2021-05-29 DIAGNOSIS — Z885 Allergy status to narcotic agent status: Secondary | ICD-10-CM

## 2021-05-29 DIAGNOSIS — Z7901 Long term (current) use of anticoagulants: Secondary | ICD-10-CM

## 2021-05-29 DIAGNOSIS — I480 Paroxysmal atrial fibrillation: Secondary | ICD-10-CM | POA: Diagnosis not present

## 2021-05-29 DIAGNOSIS — I5032 Chronic diastolic (congestive) heart failure: Secondary | ICD-10-CM | POA: Diagnosis not present

## 2021-05-29 DIAGNOSIS — I110 Hypertensive heart disease with heart failure: Secondary | ICD-10-CM | POA: Diagnosis present

## 2021-05-29 DIAGNOSIS — J9621 Acute and chronic respiratory failure with hypoxia: Secondary | ICD-10-CM

## 2021-05-29 DIAGNOSIS — I517 Cardiomegaly: Secondary | ICD-10-CM | POA: Diagnosis not present

## 2021-05-29 DIAGNOSIS — Z886 Allergy status to analgesic agent status: Secondary | ICD-10-CM

## 2021-05-29 DIAGNOSIS — R110 Nausea: Secondary | ICD-10-CM

## 2021-05-29 DIAGNOSIS — R918 Other nonspecific abnormal finding of lung field: Secondary | ICD-10-CM | POA: Diagnosis not present

## 2021-05-29 LAB — BASIC METABOLIC PANEL
BUN: 22 mg/dL (ref 6–23)
CO2: 29 mEq/L (ref 19–32)
Calcium: 9.1 mg/dL (ref 8.4–10.5)
Chloride: 102 mEq/L (ref 96–112)
Creatinine, Ser: 1.57 mg/dL — ABNORMAL HIGH (ref 0.40–1.50)
GFR: 44.07 mL/min — ABNORMAL LOW (ref >60.00)
Glucose, Bld: 92 mg/dL (ref 70–99)
Potassium: 4.6 mEq/L (ref 3.5–5.1)
Sodium: 137 mEq/L (ref 135–145)

## 2021-05-29 LAB — CBC WITH DIFFERENTIAL/PLATELET
Basophils Absolute: 0 10*3/uL (ref 0.0–0.1)
Basophils Relative: 0.3 % (ref 0.0–3.0)
Eosinophils Absolute: 0.1 10*3/uL (ref 0.0–0.7)
Eosinophils Relative: 0.7 % (ref 0.0–5.0)
HCT: 38.2 % — ABNORMAL LOW (ref 39.0–52.0)
Hemoglobin: 13 g/dL (ref 13.0–17.0)
Lymphocytes Relative: 13.5 % (ref 12.0–46.0)
Lymphs Abs: 1 10*3/uL (ref 0.7–4.0)
MCHC: 34.2 g/dL (ref 30.0–36.0)
MCV: 97.5 fl (ref 78.0–100.0)
Monocytes Absolute: 0.9 10*3/uL (ref 0.1–1.0)
Monocytes Relative: 11.3 % (ref 3.0–12.0)
Neutro Abs: 5.6 10*3/uL (ref 1.4–7.7)
Neutrophils Relative %: 74.2 % (ref 43.0–77.0)
Platelets: 224 10*3/uL (ref 150.0–400.0)
RBC: 3.91 Mil/uL — ABNORMAL LOW (ref 4.22–5.81)
RDW: 16.1 % — ABNORMAL HIGH (ref 11.5–15.5)
WBC: 7.5 10*3/uL (ref 4.0–10.5)

## 2021-05-29 LAB — HEPATIC FUNCTION PANEL
ALT: 20 U/L (ref 0–53)
AST: 34 U/L (ref 0–37)
Albumin: 3.7 g/dL (ref 3.5–5.2)
Alkaline Phosphatase: 66 U/L (ref 39–117)
Bilirubin, Direct: 0.3 mg/dL (ref 0.0–0.3)
Total Bilirubin: 1.1 mg/dL (ref 0.2–1.2)
Total Protein: 6.5 g/dL (ref 6.0–8.3)

## 2021-05-29 LAB — PROTIME-INR
INR: 1.9 — ABNORMAL HIGH (ref 0.8–1.2)
Prothrombin Time: 21.2 seconds — ABNORMAL HIGH (ref 11.4–15.2)

## 2021-05-29 LAB — TSH: TSH: 0.71 u[IU]/mL (ref 0.35–5.50)

## 2021-05-29 LAB — BRAIN NATRIURETIC PEPTIDE: Pro B Natriuretic peptide (BNP): 926 pg/mL — ABNORMAL HIGH (ref 0.0–100.0)

## 2021-05-29 LAB — APTT: aPTT: 38 seconds — ABNORMAL HIGH (ref 24–36)

## 2021-05-29 LAB — TROPONIN I (HIGH SENSITIVITY)
High Sens Troponin I: 216 ng/L (ref 2–17)
Troponin I (High Sensitivity): 187 ng/L (ref <18)
Troponin I (High Sensitivity): 198 ng/L (ref <18)

## 2021-05-29 LAB — PROCALCITONIN: Procalcitonin: <0.1 ng/mL

## 2021-05-29 LAB — RESP PANEL BY RT-PCR (FLU A&B, COVID) ARPGX2
Influenza A by PCR: NEGATIVE
Influenza B by PCR: NEGATIVE
SARS Coronavirus 2 by RT PCR: NEGATIVE

## 2021-05-29 LAB — MRSA NEXT GEN BY PCR, NASAL: MRSA by PCR Next Gen: NOT DETECTED

## 2021-05-29 LAB — D-DIMER, QUANTITATIVE: D-Dimer, Quant: 0.39 mcg/mL FEU (ref <0.50)

## 2021-05-29 LAB — SEDIMENTATION RATE: Sed Rate: 43 mm/hr — ABNORMAL HIGH (ref 0–16)

## 2021-05-29 LAB — HEPARIN LEVEL (UNFRACTIONATED): Heparin Unfractionated: >1.1 IU/mL — ABNORMAL HIGH (ref 0.30–0.70)

## 2021-05-29 MED ORDER — CLONAZEPAM 0.5 MG PO TABS
0.5000 mg | ORAL_TABLET | Freq: Every day | ORAL | Status: DC
  Administered 2021-05-30 – 2021-06-01 (×3): 0.5 mg via ORAL
  Filled 2021-05-29 (×3): qty 1

## 2021-05-29 MED ORDER — PANTOPRAZOLE SODIUM 40 MG PO TBEC
40.0000 mg | DELAYED_RELEASE_TABLET | Freq: Every day | ORAL | Status: DC
  Administered 2021-05-29 – 2021-05-31 (×3): 40 mg via ORAL
  Filled 2021-05-29 (×3): qty 1

## 2021-05-29 MED ORDER — CHLORHEXIDINE GLUCONATE CLOTH 2 % EX PADS
6.0000 | MEDICATED_PAD | Freq: Every day | CUTANEOUS | Status: DC
  Administered 2021-05-29 – 2021-05-31 (×3): 6 via TOPICAL

## 2021-05-29 MED ORDER — AZITHROMYCIN 500 MG PO TABS
500.0000 mg | ORAL_TABLET | Freq: Every day | ORAL | Status: DC
  Administered 2021-05-29 – 2021-05-30 (×2): 500 mg via ORAL
  Filled 2021-05-29: qty 1
  Filled 2021-05-29: qty 2
  Filled 2021-05-29: qty 1

## 2021-05-29 MED ORDER — FUROSEMIDE 10 MG/ML IJ SOLN
40.0000 mg | Freq: Once | INTRAMUSCULAR | Status: AC
  Administered 2021-05-29: 40 mg via INTRAVENOUS
  Filled 2021-05-29: qty 4

## 2021-05-29 MED ORDER — MIRTAZAPINE 7.5 MG PO TABS
30.0000 mg | ORAL_TABLET | Freq: Every day | ORAL | Status: DC
  Administered 2021-05-29 – 2021-05-31 (×3): 30 mg via ORAL
  Filled 2021-05-29 (×3): qty 4

## 2021-05-29 MED ORDER — DIMENHYDRINATE 50 MG PO TABS
50.0000 mg | ORAL_TABLET | Freq: Three times a day (TID) | ORAL | Status: DC | PRN
  Administered 2021-05-30: 50 mg via ORAL
  Filled 2021-05-29 (×2): qty 1

## 2021-05-29 MED ORDER — METHYLPREDNISOLONE SODIUM SUCC 40 MG IJ SOLR
40.0000 mg | Freq: Two times a day (BID) | INTRAMUSCULAR | Status: DC
  Administered 2021-05-29 – 2021-05-30 (×2): 40 mg via INTRAVENOUS
  Filled 2021-05-29 (×3): qty 1

## 2021-05-29 MED ORDER — CLONAZEPAM 0.5 MG PO TABS
1.0000 mg | ORAL_TABLET | ORAL | Status: DC
  Administered 2021-05-29 – 2021-06-01 (×6): 1 mg via ORAL
  Filled 2021-05-29 (×6): qty 2

## 2021-05-29 MED ORDER — DILTIAZEM HCL ER COATED BEADS 120 MG PO CP24
120.0000 mg | ORAL_CAPSULE | Freq: Every morning | ORAL | Status: DC
  Administered 2021-05-30 – 2021-06-01 (×3): 120 mg via ORAL
  Filled 2021-05-29 (×3): qty 1

## 2021-05-29 MED ORDER — SODIUM CHLORIDE 0.9 % IV SOLN
2.0000 g | INTRAVENOUS | Status: DC
  Administered 2021-05-29: 2 g via INTRAVENOUS
  Filled 2021-05-29: qty 20

## 2021-05-29 MED ORDER — HEPARIN BOLUS VIA INFUSION
4000.0000 [IU] | Freq: Once | INTRAVENOUS | Status: AC
  Administered 2021-05-29: 4000 [IU] via INTRAVENOUS
  Filled 2021-05-29: qty 4000

## 2021-05-29 MED ORDER — GABAPENTIN 300 MG PO CAPS
600.0000 mg | ORAL_CAPSULE | Freq: Two times a day (BID) | ORAL | Status: DC
  Administered 2021-05-29 – 2021-06-01 (×6): 600 mg via ORAL
  Filled 2021-05-29 (×6): qty 2

## 2021-05-29 MED ORDER — ACETAMINOPHEN 650 MG RE SUPP: 650.0000 mg | Freq: Four times a day (QID) | RECTAL | Status: DC | PRN

## 2021-05-29 MED ORDER — HEPARIN (PORCINE) 25000 UT/250ML-% IV SOLN
1000.0000 [IU]/h | INTRAVENOUS | Status: DC
  Administered 2021-05-29: 1100 [IU]/h via INTRAVENOUS
  Filled 2021-05-29: qty 250

## 2021-05-29 MED ORDER — CLONAZEPAM 0.5 MG PO TABS: 0.5000 mg | ORAL_TABLET | ORAL | Status: DC

## 2021-05-29 MED ORDER — ROSUVASTATIN CALCIUM 20 MG PO TABS
20.0000 mg | ORAL_TABLET | Freq: Every day | ORAL | Status: DC
  Administered 2021-05-30 – 2021-06-01 (×3): 20 mg via ORAL
  Filled 2021-05-29 (×3): qty 1

## 2021-05-29 MED ORDER — FUROSEMIDE 10 MG/ML IJ SOLN
40.0000 mg | Freq: Two times a day (BID) | INTRAMUSCULAR | Status: DC
Start: 2021-05-30 — End: 2021-06-01
  Administered 2021-05-30 – 2021-06-01 (×4): 40 mg via INTRAVENOUS
  Filled 2021-05-29 (×5): qty 4

## 2021-05-29 MED ORDER — ACETAMINOPHEN 325 MG PO TABS
650.0000 mg | ORAL_TABLET | Freq: Four times a day (QID) | ORAL | Status: DC | PRN
  Administered 2021-05-29 – 2021-06-01 (×4): 650 mg via ORAL
  Filled 2021-05-29 (×4): qty 2

## 2021-05-29 MED ORDER — FUROSEMIDE 10 MG/ML IJ SOLN: 20.0000 mg | Freq: Once | INTRAMUSCULAR | Status: DC

## 2021-05-29 MED ORDER — NINTEDANIB ESYLATE 150 MG PO CAPS
150.0000 mg | ORAL_CAPSULE | Freq: Two times a day (BID) | ORAL | Status: DC
  Administered 2021-05-31 – 2021-06-01 (×3): 150 mg via ORAL
  Filled 2021-05-29 (×3): qty 1

## 2021-05-29 MED ORDER — ORAL CARE MOUTH RINSE
15.0000 mL | Freq: Two times a day (BID) | OROMUCOSAL | Status: DC
  Administered 2021-05-29 – 2021-05-31 (×5): 15 mL via OROMUCOSAL

## 2021-05-29 MED ORDER — BUDESONIDE 0.5 MG/2ML IN SUSP
0.5000 mg | Freq: Two times a day (BID) | RESPIRATORY_TRACT | Status: DC
  Administered 2021-05-30: 0.5 mg via RESPIRATORY_TRACT
  Filled 2021-05-29: qty 2

## 2021-05-29 MED ORDER — ASPIRIN 325 MG PO TABS: 325.0000 mg | ORAL_TABLET | Freq: Once | ORAL | Status: DC

## 2021-05-29 MED ORDER — ASPIRIN 325 MG PO TABS
325.0000 mg | ORAL_TABLET | Freq: Once | ORAL | Status: AC
Start: 2021-05-29 — End: 2021-05-30
  Administered 2021-05-30: 325 mg via ORAL
  Filled 2021-05-29: qty 1

## 2021-05-29 NOTE — Telephone Encounter (Signed)
Pt was seen by BW today 4/5. Pt is currently at ED due to elevated troponin and due to his breathing. ?

## 2021-05-29 NOTE — Telephone Encounter (Signed)
Trop was elevated, advised patient to go to ED.  ?Patient is more short of breath and newly requiring oxygen  ?

## 2021-05-29 NOTE — Progress Notes (Signed)
? ?'@Patient'$  ID: Eduardo Fuentes, male    DOB: 1950/03/19, 71 y.o.   MRN: [id number] ? ?Chief Complaint  ?Patient presents with  ? Follow-up  ? ? ?Referring provider: ?Lujean Amel, MD ? ?HPI: ?71 year old male, never smoked. PMH significant for coronary artery calcification, afib, IPF, prostate CA, hyperlipidemia, GAD, panic disorder with agoraphobia.  ? ?Previous [facility] pulmonary encounter: ?03/15/21- Dr. Chase Caller  ?IPF [clinical diagnosis based on classic UIP scan negative serology and age and male gender; diagnosis given February 11, 2017  ? -  Ofev since 02/27/2018.  Stopped nintedanib May 2021 due to weight loss ? - .  Started pirfenidone approximately June 3 or July 29, 2019. -> stopped nov 2021 due to weight loss (worse than with ofev) and GI side effects ?  -> wrestart ofev 02/09/2020 ? ?Overall progressive between 2019 and 2021 but is stable in the last 1 year between October 2020 and November/December 2021 -> stable April 2022 -> wose Jan 2023 ? ?Eduardo Fuentes 71 y.o. -returns for follow-up.  He had his last spirometry in September 2022.  He had another spirometry now.  Shows a 9% decline in FVC in a matter of a few months.  But subjectively he tells me that he is doing stable.  His overall symptom score is 6 and the same.  He is working out quite well in [employer].  His main complaint is that he had some itching in his bilateral thigh anteriorly a few weeks ago this then suddenly turned into burning a week ago and but now it is improved.  He did not know why.  He also tells me that he is having low backache because he sleeps 13 hours a day and he also works out.  Other than that he was feeling fine.  However I did indicate to him that his spirometry has declined.  I also indicated to him that he has had weight loss of at least 20 pounds in 3 years and 10-11 pounds in the last 2 years.  He is surprised by this.  He does admit that the nintedanib reduces his appetite.  At first he said he did not have much  diarrhea but later I saw him wearing diapers.  He tells me that he is having 5-6 bowel movements in the morning.  They are not as loose as they once were but is still significant.  I explained to him that the weight loss and the diarrhea probably nintedanib side effects.  He does not want to give up nintedanib because he feels it is beneficial even in the face of progression.  Otherwise if worries that the progression would be worse without the nintedanib.  Then he had another symptom as he was ready to leave.  He told me that his cough is actually worse in the last year given the marked to the level 2 out of 5.  He wanted know if he could take over-the-counter medications for the cough. ? ?In essence ?- Progression in PFT ?- Weight loss and diarrhea due to nintedanib and some abdominal cramping as well.  He is not wanting to give up on nintedanib. ?- Also worsening cough ?- He is interested in clinical trial as a care option [previously screened fail for another study] ? ? ?05/29/2021- Interim hx  ?Patient presents today for acute OV. Patient reports increased shortness of breath x 2-3 weeks. Feels he has went down hill the last couple of weeks. He was able to  mow his yard last week but needed to stop several times to recover. He checked his O2 at home and reports it was in the low 70s. His O2 was 78-80% today on room air. He has no acute respiratory symptoms except for worsening dyspnea felt to be due to progression of IPF. He is taking Ofev as prescribed. Being evaluated for research trials as care option. Echocardiogram showed grade 1 DD. Due for spirometry/ follow-up in April ? ?  ?SYMPTOM SCALE - ILD 03/28/2019 ?  07/20/2019 - 180# ?Stop pfev 11/01/2019 ?182# start esbriet in June 2021 02/09/2020 ?189# - stopped esbriet 01/24/31 due to weight loss 11/08/2020 ?175 03/15/2021 ?169# 05/29/2021 ?On OFEV  ?O2 use ra ra       ra RA   ?Shortness of Breath 0 -> 5 scale with 5 being worst (score 6 If unable to do)             ?At  rest 0 0   0 0 0 2  ?Simple tasks - showers, clothes change, eating, shaving 0 0   0 0 1 2.5  ?Household (dishes, doing bed, laundry) '1 1   1 1 1 '$ NA  ?Shopping 1 0   '1 1 1 3  '$ ?Walking level at own pace '1 1   1 1 1 '$ 2.5  ?Walking up Stairs '2 2   3 2 2 4  '$ ?Total (30-36) Dyspnea Score '5 4   6 5 6 14  '$ ?How bad is your cough? '1 1   1 1 2 3  '$ ?How bad is your fatigue '1 1   2 2 2 4  '$ ?How bad is nausea '3 2   2 1 1 1  '$ ?How bad is vomiting? ?  0 0   0 0 0 0  ?How bad is diarrhea? '1 1   1 2 2 1  '$ ?How bad is anxiety? '1 1   1 1 2 3  '$ ?How bad is depression 1 0   '2 1 2 3  '$ ?  ?  01/29/2018 ?  03/16/2018 ?190# 04/29/2018 ?193# 07/20/2019 ?180# 11/01/2019 ?182# 02/09/2020 ?189# 11/08/2020 ?175# 03/15/2021 ?  05/29/2021 ?On OFEV  ?O2 used Room air rooom air Room air    room air   ra ra RA  ?Number laps completed '3 3 3    '$ three       2 laps  ?Comments about pace 250 feet x 3 laps on POD B at [employer] pace Normal pace    normal pace       Slow pace  ?Resting Pulse Ox/HR 100% and 89/min 100% and 110/min 96% and 93/min 97% and 93/min 100% and HR 90/min 97% and 80/,in 98% and 73 99% and 71 92%   ?Final Pulse Ox/HR 96% and 109/min 97% and 118/min 91% and 107/min 90% and 110/min 93% and 106/minm 92% and 90/min 90% and 89 86% for few seconds in iddl of 2nd lap -> never stpped 95$ 80%  ?Desaturated </= 88% no no no       no no Yes  ?Desaturated <= 3% points Yes, 4poin Yes, 3 poitns Yes, 5 points Yes, 7 points Yes, 7 points Yes, 5 points Yes, 8 points Yes 5 points Yes  ?Got Tachycardic >/= 90/min yes yes yes   yes yes no yes No  ?Symptoms at end of test no Mild dyspnea Mild dyspnea     No dyspnea Very mild dyspnea Mild dyspnea Mild dyspnea  ?Miscellaneous  comments no x x Likely worse stable stable Weight loss     ?  ?  ? ? ?Allergies  ?Allergen Reactions  ? Aleve [Naproxen Sodium] Anaphylaxis and Other (See Comments)  ?  GI UPSET ?Patient does not remember anaphylaxis  ? Demerol [Meperidine] Rash  ?   ?  ? Hyzaar [Losartan Potassium-Hctz]  Other (See Comments)  ?  Felt bad ?  ? Lisinopril Cough  ? ? ?Immunization History  ?Administered Date(s) Administered  ? Fluad Quad(high Dose 65+) 12/30/2018  ? Influenza Inj Mdck Quad Pf 11/09/2017  ? Influenza Split 12/05/2005, 11/27/2010, 12/19/2011, 11/26/2012  ? Influenza, High Dose Seasonal PF 12/10/2020  ? Influenza,inj,Quad PF,6+ Mos 10/29/2016, 11/09/2017, 11/18/2018, 11/18/2019  ? Influenza,inj,quad, With Preservative 10/25/2013, 11/27/2014, 10/26/2015  ? PFIZER(Purple Top)SARS-COV-2 Vaccination 04/02/2019, 04/27/2019, 12/10/2019, 07/27/2020  ? Pneumococcal Conjugate-13 10/26/2015  ? Pneumococcal Polysaccharide-23 10/29/2016  ? Td 02/24/1998  ? Tdap 07/15/2011  ? Zoster Recombinat (Shingrix) 12/03/2018, 02/01/2019  ? Zoster, Live 03/19/2012  ? ? ?Past Medical History:  ?Diagnosis Date  ? Agatston CAC score, <100 04/2018  ? Arthritis   ? NECK  ? Cough   ? NON-PRODUCTIVE  ? Depressive disorder 10/2011  ? follow by dr Toy Care psychiatry; has anxiety and panic disorder as well.  ? Diverticulosis   ? Diverticulosis of colon   ? Followed by Dr. Henrene Pastor  ? Essential hypertension   ? Generalized anxiety disorder   ? GERD (gastroesophageal reflux disease)   ? WATCHES DIET  ? Hemorrhoid   ? Hiatal hernia   ? Describes a sliding hiatal hernia  ? History of colon polyps   ? History of kidney stones   ? Idiopathic pulmonary fibrosis ([place])   ? Microscopic hematuria   ? Nocturia   ? Panic attacks   ? Prostate cancer ([place]) 07/2012  ? Dr. Arnell Sieving urology.  ? Sinus infection   ? Tinnitus   ? LEFT EAR -- CHRONIC  ? ? ?Tobacco History: ?Social History  ? ?Tobacco Use  ?Smoking Status Never  ?Smokeless Tobacco Never  ? ?Counseling given: Not Answered ? ? ?Outpatient Medications Prior to Visit  ?Medication Sig Dispense Refill  ? apixaban (ELIQUIS) 5 MG TABS tablet Take 1 tablet (5 mg total) by mouth 2 (two) times daily. 60 tablet 3  ? clonazePAM (KLONOPIN) 1 MG tablet Take 1 tablet at 8 am, 1/2 tablet at 2 pm and 1  tablet at bedtime for anxiety. 75 tablet 0  ? diltiazem (CARDIZEM CD) 120 MG 24 hr capsule TAKE 1 CAPSULE(120 MG) BY MOUTH DAILY 30 capsule 6  ? gabapentin (NEURONTIN) 300 MG capsule Take 600 mg by mouth 2 (two)

## 2021-05-29 NOTE — ED Notes (Signed)
Please see previous labs in pts chart.  ? ?Troponin and BNP elevated ?

## 2021-05-29 NOTE — Assessment & Plan Note (Addendum)
-  Elevated BNP of 926 ?-Not grossly overloaded ?-Trial lasix 40 x1 and follow continue with diuresis per cardiology recommendations and now on p.o. 20 mg Lasix daily ?-Strict I&O, daily weights; patient is -1.981 L since admission ?-Right and left heart cath done and delineated as in the note ?

## 2021-05-29 NOTE — H&P (Signed)
?History and Physical  ? ? ?[name] ASN:[id number] DOB: [birth year] DOA: 05/29/2021 ? ?PCP: Lujean Amel, MD  ?Patient coming from: home ? ?I have personally briefly reviewed patient's old medical records in [place] ? ?Chief Complaint: shortness of breath ? ?HPI: Eduardo Fuentes is Eduardo Fuentes 71 y.o. male with medical history significant of IPF on OREF, atrial fibrillation on eliquis, depression/anxiety, GERD and multiple other medical issues presenting to the ED after [facility] clinic visit for progressive SOB on exertion, found to have elevated troponin and BNP. ? ?He notes for past 2-3 weeks, feels like switch was flipped.  He's been more SOB with exertion.  Prior to 3 weeks ago, was able to line his driveway with bricks without issue.  He used to pick up his dog after he took her out.  In the past 2-3 weeks, he's unable to pick up his dog anymore due to the SOB.  He's noticing much more SOB with typical tasks. Denies orthopnea, notes PND, denies edema.  Notes occasional dull CP, improves after Jerrod Damiano few minutes.  Sometimes improves after rubbing it.  L sided.  No clear exacerbating features.  Denies smoking or etoh use. ? ?Clinic and ED Course: Seen in pulmonary clinic by Geraldo Pitter, noted to be hypoxic, discharge on 6-8 L with activity, labs collected (troponin, BNP, CBC, BMP, HFP, troponin, TSH, D dimer).  Troponin and BNP were elevated.  EDP discussed with cardiology who recommended medical admission to [facility]. ? ?Review of Systems: As per HPI otherwise all other systems reviewed and are negative. ? ?Past Medical History:  ?Diagnosis Date  ? Agatston CAC score, <100 04/2018  ? Arthritis   ? NECK  ? Cough   ? NON-PRODUCTIVE  ? Depressive disorder 10/2011  ? follow by dr Toy Care psychiatry; has anxiety and panic disorder as well.  ? Diverticulosis   ? Diverticulosis of colon   ? Followed by Dr. Henrene Pastor  ? Essential hypertension   ? Generalized anxiety disorder   ? GERD (gastroesophageal reflux disease)   ? WATCHES DIET   ? Hemorrhoid   ? Hiatal hernia   ? Describes Ree Alcalde sliding hiatal hernia  ? History of colon polyps   ? History of kidney stones   ? Idiopathic pulmonary fibrosis ([place])   ? Microscopic hematuria   ? Nocturia   ? Panic attacks   ? Prostate cancer ([place]) 07/2012  ? Dr. Arnell Sieving urology.  ? Sinus infection   ? Tinnitus   ? LEFT EAR -- CHRONIC  ? ? ?Past Surgical History:  ?Procedure Laterality Date  ? COLONOSCOPY  03/04/2013  ? ESOPHAGOGASTRODUODENOSCOPY    ? EXTRACORPOREAL SHOCK WAVE LITHOTRIPSY  YRS AGO  ? PROSTATE BIOPSY    ? RADIOACTIVE SEED IMPLANT N/Mordecai Tindol 02/02/2013  ? Procedure: RADIOACTIVE SEED IMPLANT;  Surgeon: Molli Hazard, MD;  Location: [facility];  Service: Urology;  Laterality: N/Maximiliano Cromartie;  ? TONSILLECTOMY AND ADENOIDECTOMY  AS CHILD  ? TRANSTHORACIC ECHOCARDIOGRAM  06/22/2020  ? LVEF 60-65%. No RWMA. Moderate Basal0-Septal Asymmetric LVH. Gr1 DD. Normal RV size & Fxn with normal RVP/RAP.  Mild AoV Sclerosis w/o Stensosis. Ao Root 40 mm (mild dilation)  ? UPPER GASTROINTESTINAL ENDOSCOPY  2019  ? ? ?Social History ? reports that he has never smoked. He has never used smokeless tobacco. He reports that he does not drink alcohol and does not use drugs. ? ?Allergies  ?Allergen Reactions  ? Aleve [Naproxen Sodium] Anaphylaxis, Nausea Only and Other (See Comments)  ?  GI UPSET- Patient does not remember "anaphylaxis" in 2023  ? Demerol [Meperidine] Rash  ?   ?  ? Hyzaar [Losartan Potassium-Hctz] Other (See Comments)  ?  "Felt badly" ?  ? Lisinopril Cough  ? ? ?Family History  ?Problem Relation Age of Onset  ? Dementia Mother   ? Anxiety disorder Mother   ? Alzheimer's disease Mother   ? Bone cancer Father   ?     Metastatic prostate  ? Cancer Father   ?     prostate  ? Heart attack Father 92  ?     Referred for CABG  ? Anxiety disorder Sister   ? Colon cancer Neg Hx   ? Esophageal cancer Neg Hx   ? Rectal cancer Neg Hx   ? Stomach cancer Neg Hx   ? Colon polyps Neg Hx   ? ?Prior to  Admission medications   ?Medication Sig Start Date End Date Taking? Authorizing Provider  ?apixaban (ELIQUIS) 5 MG TABS tablet Take 1 tablet (5 mg total) by mouth 2 (two) times daily. 04/25/21  Yes Fenton, Clint R, PA  ?clonazePAM (KLONOPIN) 1 MG tablet Take 1 tablet at 8 am, 1/2 tablet at 2 pm and 1 tablet at bedtime for anxiety. ?Patient taking differently: Take 0.5-1 mg by mouth See admin instructions. Take 1 mg by mouth in the morning and at bedtime- may take an additional 0.5-1 mg during the day as needed for anxiety 11/05/11  Yes Readling, Milana Huntsman, MD  ?diltiazem (CARDIZEM CD) 120 MG 24 hr capsule TAKE 1 CAPSULE(120 MG) BY MOUTH DAILY ?Patient taking differently: Take 120 mg by mouth in the morning. 02/20/21  Yes Fenton, Clint R, PA  ?dimenhyDRINATE (DRAMAMINE) 50 MG tablet Take 50 mg by mouth every 8 (eight) hours as needed for nausea (or vomiting- when not taking Zofran).   Yes [provider]  ?gabapentin (NEURONTIN) 300 MG capsule Take 600 mg by mouth in the morning and at bedtime.   Yes [provider]  ?mirtazapine (REMERON) 30 MG tablet Take 30 mg by mouth at bedtime.  12/22/17  Yes [provider]  ?Nintedanib (OFEV) 150 MG CAPS TAKE 1 CAPSULE (150 MG TOTAL) BY MOUTH 2 (TWO) TIMES DAILY. ?Patient taking differently: Take 150 mg by mouth in the morning and at bedtime. 03/01/21  Yes Brand Males, MD  ?ondansetron (ZOFRAN) 4 MG tablet TAKE 1 TO 2 TABLETS BY MOUTH WITH OFEV ?Patient taking differently: Take 4-8 mg by mouth daily as needed for nausea or vomiting. 05/21/21  Yes Irene Shipper, MD  ?OXYGEN Inhale 2-8 L/min into the lungs See admin instructions. 2 L/min of oxygen when at rest and 8 L/min during any exertion continuously   Yes [provider]  ?pantoprazole (PROTONIX) 40 MG tablet TAKE 1 TABLET(40 MG) BY MOUTH DAILY ?Patient taking differently: Take 40 mg by mouth at bedtime. 03/04/21  Yes Irene Shipper, MD  ?psyllium (METAMUCIL) 58.6 % powder Take 1 packet by  mouth at bedtime.   Yes [provider]  ?rosuvastatin (CRESTOR) 20 MG tablet Take 1 tablet (20 mg total) by mouth daily. 08/29/20 05/29/21 Yes Caron Presume K, PA-C  ?TYLENOL 500 MG tablet Take 1,000 mg by mouth every 6 (six) hours as needed for mild pain or headache.   Yes [provider]  ? ? ?Physical Exam: ?Vitals:  ? 05/29/21 1730 05/29/21 1745 05/29/21 1815 05/29/21 1823  ?BP: (!) 153/101 (!) 161/113 (!) 164/101   ?Pulse: 70 87 82   ?  Resp: '17 16 20   '$ ?Temp:    98 ?F (36.7 ?C)  ?TempSrc:    Oral  ?SpO2: 99% 96% 100%   ? ? ?Constitutional: NAD, calm, comfortable ?Vitals:  ? 05/29/21 1730 05/29/21 1745 05/29/21 1815 05/29/21 1823  ?BP: (!) 153/101 (!) 161/113 (!) 164/101   ?Pulse: 70 87 82   ?Resp: '17 16 20   '$ ?Temp:    98 ?F (36.7 ?C)  ?TempSrc:    Oral  ?SpO2: 99% 96% 100%   ? ?Eyes: PERRL, lids and conjunctivae normal ?ENMT: Mucous membranes are moist.  ?Neck: normal, supple ?Respiratory: bibasilar crackles ?Cardiovascular: Regular rate and rhythm, no murmurs / rubs / gallops. No extremity edema.  ?Abdomen: s/nt/nd ?Musculoskeletal: no clubbing / cyanosis. No joint deformity upper and lower extremities. Good ROM, no contractures. Normal muscle tone.  ?Skin: no rashes, lesions, ulcers. No induration ?Neurologic: CN 2-12 grossly intact. Sensation intact, DTR normal. Strength 5/5 in all 4.  ?Psychiatric: Normal judgment and insight. Alert and oriented x 3. Normal mood.  ? ?Labs on Admission: I have personally reviewed following labs and imaging studies ? ?CBC: ?Recent Labs  ?Lab 05/29/21 ?1045  ?WBC 7.5  ?NEUTROABS 5.6  ?HGB 13.0  ?HCT 38.2*  ?MCV 97.5  ?PLT 224.0  ? ? ?Basic Metabolic Panel: ?Recent Labs  ?Lab 05/29/21 ?1045  ?NA 137  ?K 4.6  ?CL 102  ?CO2 29  ?GLUCOSE 92  ?BUN 22  ?CREATININE 1.57*  ?CALCIUM 9.1  ? ? ?GFR: ?Estimated Creatinine Clearance: 43.2 mL/min (Vinson Tietze) (by C-G formula based on SCr of 1.57 mg/dL (H)). ? ?Liver Function Tests: ?Recent Labs  ?Lab 05/29/21 ?1045  ?AST 34  ?ALT  20  ?ALKPHOS 66  ?BILITOT 1.1  ?PROT 6.5  ?ALBUMIN 3.7  ? ? ?Urine analysis: ?   ?Component Value Date/Time  ? COLORURINE YELLOW 03/06/2021 1605  ? APPEARANCEUR CLEAR 03/06/2021 1605  ? LABSPEC 1.020 01/11/202

## 2021-05-29 NOTE — Progress Notes (Signed)
ANTICOAGULATION CONSULT NOTE - Initial Consult ? ?Pharmacy Consult for Heparin while Eliquis on hold ?Indication: atrial fibrillation, elevated troponin ? ?Allergies  ?Allergen Reactions  ? Aleve [Naproxen Sodium] Anaphylaxis, Nausea Only and Other (See Comments)  ?  GI UPSET- Patient does not remember "anaphylaxis" in 2023  ? Demerol [Meperidine] Rash  ?   ?  ? Hyzaar [Losartan Potassium-Hctz] Other (See Comments)  ?  "Felt badly" ?  ? Lisinopril Cough  ? ? ?Patient Measurements: ?  ?Heparin Dosing Weight: 77.9 kg ? ?Vital Signs: ?Temp: 98 ?F (36.7 ?C) (04/05 1823) ?Temp Source: Oral (04/05 1823) ?BP: 164/101 (04/05 1815) ?Pulse Rate: 82 (04/05 1815) ? ?Labs: ?Recent Labs  ?  05/29/21 ?1045 05/29/21 ?1432  ?HGB 13.0  --   ?HCT 38.2*  --   ?PLT 224.0  --   ?CREATININE 1.57*  --   ?TROPONINIHS  --  187*  ? ? ?Estimated Creatinine Clearance: 43.2 mL/min (A) (by C-G formula based on SCr of 1.57 mg/dL (H)). ? ? ?Medical History: ?Past Medical History:  ?Diagnosis Date  ? Agatston CAC score, <100 04/2018  ? Arthritis   ? NECK  ? Cough   ? NON-PRODUCTIVE  ? Depressive disorder 10/2011  ? follow by dr Toy Care psychiatry; has anxiety and panic disorder as well.  ? Diverticulosis   ? Diverticulosis of colon   ? Followed by Dr. Henrene Pastor  ? Essential hypertension   ? Generalized anxiety disorder   ? GERD (gastroesophageal reflux disease)   ? WATCHES DIET  ? Hemorrhoid   ? Hiatal hernia   ? Describes a sliding hiatal hernia  ? History of colon polyps   ? History of kidney stones   ? 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 Microscopic hematuria   ? Nocturia   ? Panic attacks   ? Prostate cancer ([place]) 07/2012  ? Dr. Arnell Sieving urology.  ? Sinus infection   ? Tinnitus   ? LEFT EAR -- CHRONIC  ? ? ?Medications:  ?Scheduled:  ? azithromycin  500 mg Oral Daily  ? triamcinolone acetonide  10 mg Other Once  ? ?Infusions:  ? sodium chloride    ? cefTRIAXone (ROCEPHIN)  IV    ? ?PRN:  ? ?Assessment: ?71 yo male with hx PAF on chronic  Eliquis presents with shortness of breath and new O2 requirements.  Pharmacy is consulted to dose IV heparin for NSTEMI. ? ?Last dose of Eliquis today (4/5) at 07:30.  Baseline aPTT, PT/INR and heparin level pending. ? ?Goal of Therapy:  ?Heparin level 0.3-0.7 units/ml ?PTT 66-102 seconds ?Monitor platelets by anticoagulation protocol: Yes ?  ?Plan:  ?Heparin 4000 units IV bolus x 1 ?Heparin IV infusion 1100 units/hr ?Check aPTT and heparin level in 8hr - check both as HL will be falsely elevated with recent Eliquis use; once both levels correlate, can monitor heparin using HL only ?Daily CBC, monitor for signs/symptoms of bleeding ? ?Peggyann Juba, PharmD, BCPS ?Pharmacy: [phone] ?05/29/2021,6:30 PM ? ? ?

## 2021-05-29 NOTE — Assessment & Plan Note (Addendum)
NSTEMI due to demand from Hypoxia ?-Initial 216 -> 187 -> 112 ?-Described intermittent L sided CP lasting minutes starting 2-3 weeks ago - no clear exacerbating features.   ?-EKG showed T wave inversions in aVF, V2-V6 which appear new ?-[facility]  -Initially held Eliquis and started on heparin drip but will go back on Eliquis now ?-Patient underwent a right and left heart cath which showed:  ? ?The left ventricular systolic function is normal. ??  LV end diastolic pressure is normal. ??  The left ventricular ejection fraction is 50-55% by visual estimate. ??  Hemodynamic findings consistent with mild pulmonary hypertension. ??  There is no aortic valve stenosis. ??  Mild, diffuse nonobstructive coronary artery disease. ??  Aortic saturation 94%, PA saturation 65%, PA pressure 55/21, mean PA pressure 34 mmHg, pulmonary capillary wedge pressure 9 mmHg, cardiac output 4.75 L/min, cardiac index 2.5. ?? ?Elevated troponin likely from demand ischemia.  Nonobstructive, minimal CAD.  Continue medical therapy. ?-Cardiology has initiated the patient on p.o. Lasix 20 mg daily and have signed off the case given that he is improved ? ? ?

## 2021-05-29 NOTE — Assessment & Plan Note (Addendum)
-  Follow with diuresis ?-Patient's BUNs/creatinine improved and went from 20/1.53 -> 22/1.17 -> 25/1.21 ?-Avoid further nephrotoxic medications, contrast dyes, hypotension and renally adjust medications ?-Repeat CMP in a.m. ?

## 2021-05-29 NOTE — Assessment & Plan Note (Signed)
-   Most likely d/t progression/acute flare IPF. O2 92% at rest on room air. Oxygen saturation dropped to 78-80% with ambulation, requiring 6-8L to maintain >90%. Checking labs including d-dimer, BNP and Troponin ?

## 2021-05-29 NOTE — Plan of Care (Signed)
Discussed with patient plan of care for the evening, pain management and admission questions with some teach back displayed.  Starting heparin drip after patient comes out of bathroom (attempting bowel movement). ? ?Problem: Education: ?Goal: Knowledge of General Education information will improve ?Description: Including pain rating scale, medication(s)/side effects and non-pharmacologic comfort measures ?Outcome: Progressing ?  ?Problem: Activity: ?Goal: Risk for activity intolerance will decrease ?Outcome: Progressing ?  ?

## 2021-05-29 NOTE — ED Provider Notes (Signed)
?[facility] ?Provider Note ? ? ?CSN: [id number] ?Arrival date & time: 05/29/21  1359 ? ?  ? ?History ? ?Chief Complaint  ?Patient presents with  ? Abnormal Lab  ? ? ?Eduardo Fuentes is a 71 y.o. male. ? ?HPI ? ?71 year old male with past medical history of HLD, HTN, pulmonary fibrosis, paroxysmal atrial fibrillation anticoagulated on Eliquis presents to the emergency department with concern for worsening shortness of breath, new oxygen requirement.  Patient states over the past couple weeks has been having worsening shortness of breath, specifically with exertion.  He endorses intermittent sharp left-sided chest pain that is nonexertional.  Currently he is chest pain-free.  Was at his pulmonologist today when he was noted to be hypoxic, placed on oxygen which is a new requirement for him.  Denies any swelling of his lower extremities.  He has an intermittent nonproductive cough but denies any fever or recent illness.  Patient states he is compliant with his Eliquis. ? ?Home Medications ?Prior to Admission medications   ?Medication Sig Start Date End Date Taking? Authorizing Provider  ?apixaban (ELIQUIS) 5 MG TABS tablet Take 1 tablet (5 mg total) by mouth 2 (two) times daily. 04/25/21  Yes Fenton, Clint R, PA  ?clonazePAM (KLONOPIN) 1 MG tablet Take 1 tablet at 8 am, 1/2 tablet at 2 pm and 1 tablet at bedtime for anxiety. ?Patient taking differently: Take 0.5-1 mg by mouth See admin instructions. Take 1 mg by mouth in the morning and at bedtime- may take an additional 0.5-1 mg during the day as needed for anxiety 11/05/11  Yes Readling, Milana Huntsman, MD  ?diltiazem (CARDIZEM CD) 120 MG 24 hr capsule TAKE 1 CAPSULE(120 MG) BY MOUTH DAILY ?Patient taking differently: Take 120 mg by mouth in the morning. 02/20/21  Yes Fenton, Clint R, PA  ?dimenhyDRINATE (DRAMAMINE) 50 MG tablet Take 50 mg by mouth every 8 (eight) hours as needed for nausea (or vomiting- when not taking Zofran).   Yes [provider]  ?gabapentin (NEURONTIN) 300 MG capsule Take 600 mg by mouth in the morning and at bedtime.   Yes [provider]  ?mirtazapine (REMERON) 30 MG tablet Take 30 mg by mouth at bedtime.  12/22/17  Yes [provider]  ?Nintedanib (OFEV) 150 MG CAPS TAKE 1 CAPSULE (150 MG TOTAL) BY MOUTH 2 (TWO) TIMES DAILY. ?Patient taking differently: Take 150 mg by mouth in the morning and at bedtime. 03/01/21  Yes Brand Males, MD  ?ondansetron (ZOFRAN) 4 MG tablet TAKE 1 TO 2 TABLETS BY MOUTH WITH OFEV ?Patient taking differently: Take 4-8 mg by mouth daily as needed for nausea or vomiting. 05/21/21  Yes Irene Shipper, MD  ?OXYGEN Inhale 2-8 L/min into the lungs See admin instructions. 2 L/min of oxygen when at rest and 8 L/min during any exertion continuously   Yes [provider]  ?pantoprazole (PROTONIX) 40 MG tablet TAKE 1 TABLET(40 MG) BY MOUTH DAILY ?Patient taking differently: Take 40 mg by mouth at bedtime. 03/04/21  Yes Irene Shipper, MD  ?psyllium (METAMUCIL) 58.6 % powder Take 1 packet by mouth at bedtime.   Yes [provider]  ?rosuvastatin (CRESTOR) 20 MG tablet Take 1 tablet (20 mg total) by mouth daily. 08/29/20 05/29/21 Yes Caron Presume K, PA-C  ?TYLENOL 500 MG tablet Take 1,000 mg by mouth every 6 (six) hours as needed for mild pain or headache.   Yes [provider]  ?   ? ?Allergies    ?  Aleve [naproxen sodium], Demerol [meperidine], Hyzaar [losartan potassium-hctz], and Lisinopril   ? ?Review of Systems   ?Review of Systems  ?Constitutional:  Positive for fatigue. Negative for fever.  ?Respiratory:  Positive for chest tightness and shortness of breath.   ?Cardiovascular:  Positive for chest pain. Negative for palpitations and leg swelling.  ?Gastrointestinal:  Negative for abdominal pain, diarrhea and vomiting.  ?Skin:  Negative for rash.  ?Neurological:  Negative for headaches.  ? ?Physical Exam ?Updated Vital Signs ?BP (!) 147/99   Pulse 68   Temp  98.3 ?F (36.8 ?C) (Oral)   Resp 17   SpO2 100%  ?Physical Exam ?Vitals and nursing note reviewed.  ?Constitutional:   ?   General: He is not in acute distress. ?   Appearance: Normal appearance. He is not diaphoretic.  ?HENT:  ?   Head: Normocephalic.  ?   Mouth/Throat:  ?   Mouth: Mucous membranes are moist.  ?Cardiovascular:  ?   Rate and Rhythm: Normal rate.  ?Pulmonary:  ?   Effort: Pulmonary effort is normal. No respiratory distress.  ?   Breath sounds: Rales present.  ?Abdominal:  ?   Palpations: Abdomen is soft.  ?   Tenderness: There is no abdominal tenderness.  ?Skin: ?   General: Skin is warm.  ?Neurological:  ?   Mental Status: He is alert and oriented to person, place, and time. Mental status is at baseline.  ?Psychiatric:     ?   Mood and Affect: Mood normal.  ? ? ?ED Results / Procedures / Treatments   ?Labs ?(all labs ordered are listed, but only abnormal results are displayed) ?Labs Reviewed  ?TROPONIN I (HIGH SENSITIVITY) - Abnormal; Notable for the following components:  ?    Result Value  ? Troponin I (High Sensitivity) 187 (*)   ? All other components within normal limits  ?TROPONIN I (HIGH SENSITIVITY)  ? ? ?EKG ?EKG Interpretation ? ?Date/Time:  Wednesday May 29 2021 14:11:47 EDT ?Ventricular Rate:  84 ?PR Interval:  213 ?QRS Duration: 93 ?QT Interval:  378 ?QTC Calculation: 447 ?R Axis:   49 ?Text Interpretation: Sinus rhythm Borderline prolonged PR interval Right atrial enlargement Abnormal T, probable ischemia, widespread T wave inversions v3-v6 new Confirmed by Lavenia Atlas [id number]) on 05/29/2021 2:16:57 PM ? ?Radiology ?DG Chest 2 View ? ?Result Date: 05/29/2021 ?CLINICAL DATA:  Hypoxia EXAM: CHEST - 2 VIEW COMPARISON:  05/03/2020 FINDINGS: Transverse diameter of heart is increased. Central pulmonary vessels are more prominent. Increased markings are seen in the right upper and both lower lung fields. Right lateral CP angle is indistinct. There is no pneumothorax. IMPRESSION:  Cardiomegaly. Central pulmonary vessels are more prominent without signs of alveolar pulmonary edema. Increased markings are seen in the right upper and both lower lung fields suggesting underlying scarring and possibly superimposed interstitial pneumonia. Possible small right pleural effusion. Electronically Signed   By: Elmer Picker M.D.   On: 05/29/2021 15:11   ? ?Procedures ?Marland KitchenCritical Care ?Performed by: Lorelle Gibbs, DO ?Authorized by: Lorelle Gibbs, DO  ? ?Critical care provider statement:  ?  Critical care time (minutes):  60 ?  Critical care time was exclusive of:  Separately billable procedures and treating other patients ?  Critical care was necessary to treat or prevent imminent or life-threatening deterioration of the following conditions:  Cardiac failure and respiratory failure ?  Critical care was time spent personally by me on the following activities:  Development of treatment plan  with patient or surrogate, discussions with consultants, evaluation of patient's response to treatment, examination of patient, ordering and review of laboratory studies, ordering and review of radiographic studies, ordering and performing treatments and interventions, pulse oximetry, re-evaluation of patient's condition and review of old charts ?  I assumed direction of critical care for this patient from another provider in my specialty: no   ?  Care discussed with: admitting provider    ? ? ?Medications Ordered in ED ?Medications - No data to display ? ?ED Course/ Medical Decision Making/ A&P ?  ?                        ?Medical Decision Making ?Amount and/or Complexity of Data Reviewed ?Radiology: ordered. ? ?Risk ?Decision regarding hospitalization. ? ? ?This patient presents to the ED for concern of shortness of breath, hypoxia, this involves an extensive number of treatment options, and is a complaint that carries with it a high risk of complications and morbidity.  The differential diagnosis  includes hypoxia, pulmonary infection, ACS, CHF, PE ? ? ?Additional history obtained: ?-Additional history obtained from wife at bedside ?-External records from outside source obtained and reviewed including: Chart review

## 2021-05-29 NOTE — Assessment & Plan Note (Addendum)
-   Worsening dyspnea symptoms over the last several weeks, newly requiring oxygen. Concern IPF has progressed. If labs are normal will get HRCT imaging. Reviewed with Dr. Chase Caller who is agreeing with plan.  ?

## 2021-05-29 NOTE — ED Notes (Signed)
Critical Lab Value ? ?Troponin 187 ?Reported to K. Horton ?

## 2021-05-29 NOTE — Assessment & Plan Note (Addendum)
-  Continue diltiazem ?-Resume Eliquis now ?-Cardiology was following and right and left heart cath results as delineated ?

## 2021-05-29 NOTE — Assessment & Plan Note (Addendum)
-  Hx IPF on OFEV which has now been resumed ?-Not on home O2 ?-Today, required 6-8 L to maintain sats >90% ?-SpO2: 92 % ?O2 Flow Rate (L/min): 4 L/min ?FiO2 (%): (!) 0 % (N/A) ?-CXR with cardiomegaly, possibly superimposed interstitial pneumonia, possible small R effusion ?-Negative D dimer ?-Discussed with Dr. Chase Caller, recommended HRCT, steroids, inpatient pulm c/s ?-Will trial diuresis with 40 mg lasix x1, follow response ?-Now pulmonary thinks it is related to acute pulmonary edema.  Patient is getting a right and left heart cath today but showed no volume overload ?-The patient has an acute exacerbation of ILD ?-His Lasix was changed to p.o. and we have stopped his steroids and antibiotics ?-continue supplemental oxygen via nasal cannula and wean O2 as tolerated ?-Continuous pulse oximetry maintain O2 saturations greater than 92% ?-We will need ambulatory home O2 screen prior to discharge and repeat chest x-ray in a.m. ?-PT OT to further evaluate and treat ?

## 2021-05-29 NOTE — ED Notes (Signed)
Attempted to call report. Receiving RN will call back when available. ? ?

## 2021-05-29 NOTE — Patient Instructions (Addendum)
Recommendations: ?Continue OFEV as directed  ?You need to wear _______6-8__________ L with exertion and at bedtime  ? ?Orders: ?Labs today (d-dimer, cbc, bmet, liver function, bnp) ?Ambulatory walk test today, if desaturated please eval for POC  ?New oxygen start (would like him sent home with TOC if able) ? ?Follow-up: ?Keep apt on April 20th woth Dr. Chase Caller ?

## 2021-05-29 NOTE — Assessment & Plan Note (Addendum)
-  C/w Statin ?

## 2021-05-29 NOTE — Consult Note (Signed)
? ?NAME:  Eduardo Fuentes, MRN:  [id number], DOB:  [birth year], LOS: 0 ?ADMISSION DATE:  05/29/2021, CONSULTATION DATE:  05/29/21 ?REFERRING MD:  Florene Glen, CHIEF COMPLAINT:  SOB  ? ?History of Present Illness:  ?71 year old man with hx of IPF followed by Dr. Chase Caller presenting with worsening dyspnea for past 3 weeks.  Sudden onset. ?- MMRC 1 normally, now 3 ?- No precipitating factors ?- No infectious symptoms ?- No abdominal swelling ?- No reflux, no aspiration symptoms ?- No exacerbating or alleviating factors other than exercise and rest ?- No chest pain ? ?Trop ordered by pulm, elevated, came to ER.  CXR with worsening diffuse interstitial infiltrates, CT chest reviewed: UIP+superimposed fluid/infection/inflammaiton.  Going to [facility] for cards eval.  PCCM consulted for worsening O2 need. ? ?Looking at PFTs we have had marked steady decline over past 4 years in function ? ?Pertinent  Medical History  ?HTN ?Hiatal hernia ?IPF on ofex ?Anxiety/depression ? ?Significant Hospital Events: ?Including procedures, antibiotic start and stop dates in addition to other pertinent events   ?4/5 admitted ? ?Interim History / Subjective:  ?consulted ? ?Objective   ?Blood pressure (!) 164/101, pulse 82, temperature 98 ?F (36.7 ?C), temperature source Oral, resp. rate 20, SpO2 100 %. ?   ?   ?No intake or output data in the 24 hours ending 05/29/21 1843 ?There were no vitals filed for this visit. ? ?Examination: ?General: chronically ill man in NAD ?HENT: MMM, trachea midline, he has minimally elevated JVP but +HJR ?Lungs: Crackles to shoulders, no accessory muscle use ?Cardiovascular: RRR, ext warm ?Abdomen: Soft, mildly protuberant ?Extremities: muscle wasting, trace edema ?Neuro: moves all 4 ext to command ?GU: urinal at bedside with dark amber urine ? ?Trop minimally elevated ? ?Cr up but seems more CKD? The Cr in Jan was artificially low ? ?WBC/H/H/Plt normal ? ?Resolved Hospital Problem list   ?N/A ? ?Assessment & Plan:  ?Acute  hypoxemic respiratory failure with acute GGO on CT: infectious vs. Fluid vs. Inflammation.  Treat all 3. ?- Fine to continue ofev ?- Lasix challenge already ordered, I would continue ?- Lasix, CAP coverage abx ?- f/u RVP, ESR, Pct, LE duplex, echo ?- Will follow, hopefully he responds to one of these interventions otherwise may need GOC discussion ?- MR already aware of case ? ?Best Practice (right click and "Reselect all SmartList Selections" daily)  ?Per primary ? ?Labs   ?CBC: ?Recent Labs  ?Lab 05/29/21 ?1045  ?WBC 7.5  ?NEUTROABS 5.6  ?HGB 13.0  ?HCT 38.2*  ?MCV 97.5  ?PLT 224.0  ? ? ?Basic Metabolic Panel: ?Recent Labs  ?Lab 05/29/21 ?1045  ?NA 137  ?K 4.6  ?CL 102  ?CO2 29  ?GLUCOSE 92  ?BUN 22  ?CREATININE 1.57*  ?CALCIUM 9.1  ? ?GFR: ?Estimated Creatinine Clearance: 43.2 mL/min (A) (by C-G formula based on SCr of 1.57 mg/dL (H)). ?Recent Labs  ?Lab 05/29/21 ?1045  ?WBC 7.5  ? ? ?Liver Function Tests: ?Recent Labs  ?Lab 05/29/21 ?1045  ?AST 34  ?ALT 20  ?ALKPHOS 66  ?BILITOT 1.1  ?PROT 6.5  ?ALBUMIN 3.7  ? ?No results for input(s): LIPASE, AMYLASE in the last 168 hours. ?No results for input(s): AMMONIA in the last 168 hours. ? ?ABG ?No results found for: PHART, PCO2ART, PO2ART, HCO3, TCO2, ACIDBASEDEF, O2SAT  ? ?Coagulation Profile: ?No results for input(s): INR, PROTIME in the last 168 hours. ? ?Cardiac Enzymes: ?No results for input(s): CKTOTAL, CKMB, CKMBINDEX, TROPONINI in the last 168  hours. ? ?HbA1C: ?No results found for: HGBA1C ? ?CBG: ?No results for input(s): GLUCAP in the last 168 hours. ? ?Review of Systems:   ? ?Positive Symptoms in bold: ? ?Constitutional fevers, chills, weight loss, fatigue, anorexia, malaise  ?Eyes decreased vision, double vision, eye irritation  ?Ears, Nose, Mouth, Throat sore throat, trouble swallowing, sinus congestion  ?Cardiovascular chest pain, paroxysmal nocturnal dyspnea, lower ext edema, palpitations ?  ?Respiratory SOB, cough, DOE, hemoptysis, wheezing   ?Gastrointestinal nausea, vomiting, diarrhea  ?Genitourinary burning with urination, trouble urinating  ?Musculoskeletal joint aches, joint swelling, back pain  ?Integumentary  rashes, skin lesions  ?Neurological focal weakness, focal numbness, trouble speaking, headaches  ?Psychiatric depression, anxiety, confusion  ?Endocrine polyuria, polydipsia, cold intolerance, heat intolerance  ?Hematologic abnormal bruising, abnormal bleeding, unexplained nose bleeds  ?Allergic/Immunologic recurrent infections, hives, swollen lymph nodes  ? ? ? ?Past Medical History:  ?He,  has a past medical history of Agatston CAC score, <100 (04/2018), Arthritis, Cough, Depressive disorder (10/2011), Diverticulosis, Diverticulosis of colon, Essential hypertension, Generalized anxiety disorder, GERD (gastroesophageal reflux disease), Hemorrhoid, Hiatal hernia, History of colon polyps, History of kidney stones, Idiopathic pulmonary fibrosis (HCC), Microscopic hematuria, Nocturia, Panic attacks, Prostate cancer (HCC) (07/2012), Sinus infection, and Tinnitus.  ? ?Surgical History:  ? ?Past Surgical History:  ?Procedure Laterality Date  ? COLONOSCOPY  03/04/2013  ? ESOPHAGOGASTRODUODENOSCOPY    ? EXTRACORPOREAL SHOCK WAVE LITHOTRIPSY  YRS AGO  ? PROSTATE BIOPSY    ? RADIOACTIVE SEED IMPLANT N/A 02/02/2013  ? Procedure: RADIOACTIVE SEED IMPLANT;  Surgeon:  Young Woodruff, MD;  Location: [facility];  Service: Urology;  Laterality: N/A;  ? TONSILLECTOMY AND ADENOIDECTOMY  AS CHILD  ? TRANSTHORACIC ECHOCARDIOGRAM  06/22/2020  ? LVEF 60-65%. No RWMA. Moderate Basal0-Septal Asymmetric LVH. Gr1 DD. Normal RV size & Fxn with normal RVP/RAP.  Mild AoV Sclerosis w/o Stensosis. Ao Root 40 mm (mild dilation)  ? UPPER GASTROINTESTINAL ENDOSCOPY  2019  ?  ? ?Social History:  ? reports that he has never smoked. He has never used smokeless tobacco. He reports that he does not drink alcohol and does not use drugs.  ? ?Family History:   ?His family history includes Alzheimer's disease in his mother; Anxiety disorder in his mother and sister; Bone cancer in his father; Cancer in his father; Dementia in his mother; Heart attack (age of onset: 61) in his father. There is no history of Colon cancer, Esophageal cancer, Rectal cancer, Stomach cancer, or Colon polyps.  ? ?Allergies ?Allergies  ?Allergen Reactions  ? Aleve [Naproxen Sodium] Anaphylaxis, Nausea Only and Other (See Comments)  ?  GI UPSET- Patient does not remember "anaphylaxis" in 2023  ? Demerol [Meperidine] Rash  ?   ?  ? Hyzaar [Losartan Potassium-Hctz] Other (See Comments)  ?  "Felt badly" ?  ? Lisinopril Cough  ?  ? ?Home Medications  ?Prior to Admission medications   ?Medication Sig Start Date End Date Taking? Authorizing Provider  ?apixaban (ELIQUIS) 5 MG TABS tablet Take 1 tablet (5 mg total) by mouth 2 (two) times daily. 04/25/21  Yes Fenton, Clint R, PA  ?clonazePAM (KLONOPIN) 1 MG tablet Take 1 tablet at 8 am, 1/2 tablet at 2 pm and 1 tablet at bedtime for anxiety. ?Patient taking differently: Take 0.5-1 mg by mouth See admin instructions. Take 1 mg by mouth in the morning and at bedtime- may take an additional 0.5-1 mg during the day as needed for anxiety 11/05/11  Yes Readling, Randy D, MD  ?  diltiazem (CARDIZEM CD) 120 MG 24 hr capsule TAKE 1 CAPSULE(120 MG) BY MOUTH DAILY ?Patient taking differently: Take 120 mg by mouth in the morning. 02/20/21  Yes Fenton, Clint R, PA  ?dimenhyDRINATE (DRAMAMINE) 50 MG tablet Take 50 mg by mouth every 8 (eight) hours as needed for nausea (or vomiting- when not taking Zofran).   Yes [provider]  ?gabapentin (NEURONTIN) 300 MG capsule Take 600 mg by mouth in the morning and at bedtime.   Yes [provider]  ?mirtazapine (REMERON) 30 MG tablet Take 30 mg by mouth at bedtime.  12/22/17  Yes [provider]  ?Nintedanib (OFEV) 150 MG CAPS TAKE 1 CAPSULE (150 MG TOTAL) BY MOUTH 2 (TWO) TIMES DAILY. ?Patient taking  differently: Take 150 mg by mouth in the morning and at bedtime. 03/01/21  Yes Ramaswamy, Murali, MD  ?ondansetron (ZOFRAN) 4 MG tablet TAKE 1 TO 2 TABLETS BY MOUTH WITH OFEV ?Patient taking differently: Take 4-8 mg by mouth

## 2021-05-29 NOTE — ED Triage Notes (Signed)
Per patient, states his O2 has been low-seen by Pulmonologist today an placed on oxygen-had labs drawn and they called him back stating his Trop was elevated and to come to ED for eval-denies CP today but states he has had some sharp med chest pain 2 weeks ago ?

## 2021-05-29 NOTE — Assessment & Plan Note (Addendum)
-  Continue Klonopin °

## 2021-05-30 ENCOUNTER — Inpatient Hospital Stay (HOSPITAL_COMMUNITY)
Admission: EM | Disposition: A | Discharge status: Home / Self Care | Payer: Self-pay | Source: Home / Self Care | Attending: Internal Medicine

## 2021-05-30 ENCOUNTER — Encounter (HOSPITAL_COMMUNITY): Payer: Self-pay | Admitting: Interventional Cardiology

## 2021-05-30 DIAGNOSIS — I5030 Unspecified diastolic (congestive) heart failure: Secondary | ICD-10-CM

## 2021-05-30 DIAGNOSIS — N179 Acute kidney failure, unspecified: Secondary | ICD-10-CM

## 2021-05-30 DIAGNOSIS — F411 Generalized anxiety disorder: Secondary | ICD-10-CM

## 2021-05-30 DIAGNOSIS — E785 Hyperlipidemia, unspecified: Secondary | ICD-10-CM

## 2021-05-30 DIAGNOSIS — J810 Acute pulmonary edema: Secondary | ICD-10-CM

## 2021-05-30 DIAGNOSIS — R778 Other specified abnormalities of plasma proteins: Secondary | ICD-10-CM

## 2021-05-30 DIAGNOSIS — I214 Non-ST elevation (NSTEMI) myocardial infarction: Secondary | ICD-10-CM

## 2021-05-30 DIAGNOSIS — J9601 Acute respiratory failure with hypoxia: Secondary | ICD-10-CM

## 2021-05-30 DIAGNOSIS — J84112 Idiopathic pulmonary fibrosis: Secondary | ICD-10-CM

## 2021-05-30 DIAGNOSIS — I480 Paroxysmal atrial fibrillation: Secondary | ICD-10-CM

## 2021-05-30 HISTORY — PX: RIGHT/LEFT HEART CATH AND CORONARY ANGIOGRAPHY: CATH118266

## 2021-05-30 LAB — POCT I-STAT EG7
Acid-Base Excess: 3 mmol/L — ABNORMAL HIGH (ref 0.0–2.0)
Acid-Base Excess: 3 mmol/L — ABNORMAL HIGH (ref 0.0–2.0)
Bicarbonate: 29.6 mmol/L — ABNORMAL HIGH (ref 20.0–28.0)
Bicarbonate: 29.8 mmol/L — ABNORMAL HIGH (ref 20.0–28.0)
Calcium, Ion: 1.24 mmol/L (ref 1.15–1.40)
Calcium, Ion: 1.24 mmol/L (ref 1.15–1.40)
HCT: 37 % — ABNORMAL LOW (ref 39.0–52.0)
HCT: 37 % — ABNORMAL LOW (ref 39.0–52.0)
Hemoglobin: 12.6 g/dL — ABNORMAL LOW (ref 13.0–17.0)
Hemoglobin: 12.6 g/dL — ABNORMAL LOW (ref 13.0–17.0)
O2 Saturation: 65 %
O2 Saturation: 65 %
Potassium: 4.2 mmol/L (ref 3.5–5.1)
Potassium: 4.3 mmol/L (ref 3.5–5.1)
Sodium: 139 mmol/L (ref 135–145)
Sodium: 140 mmol/L (ref 135–145)
TCO2: 31 mmol/L (ref 22–32)
TCO2: 31 mmol/L (ref 22–32)
pCO2, Ven: 51.8 mmHg (ref 44–60)
pCO2, Ven: 52.2 mmHg (ref 44–60)
pH, Ven: 7.364 (ref 7.25–7.43)
pH, Ven: 7.365 (ref 7.25–7.43)
pO2, Ven: 36 mmHg (ref 32–45)
pO2, Ven: 36 mmHg (ref 32–45)

## 2021-05-30 LAB — COMPREHENSIVE METABOLIC PANEL
ALT: 27 U/L (ref 0–44)
AST: 37 U/L (ref 15–41)
Albumin: 3.2 g/dL — ABNORMAL LOW (ref 3.5–5.0)
Alkaline Phosphatase: 68 U/L (ref 38–126)
Anion gap: 10 (ref 5–15)
BUN: 20 mg/dL (ref 8–23)
CO2: 25 mmol/L (ref 22–32)
Calcium: 8.7 mg/dL — ABNORMAL LOW (ref 8.9–10.3)
Chloride: 101 mmol/L (ref 98–111)
Creatinine, Ser: 1.53 mg/dL — ABNORMAL HIGH (ref 0.61–1.24)
GFR, Estimated: 48 mL/min — ABNORMAL LOW (ref >60)
Glucose, Bld: 134 mg/dL — ABNORMAL HIGH (ref 70–99)
Potassium: 4.6 mmol/L (ref 3.5–5.1)
Sodium: 136 mmol/L (ref 135–145)
Total Bilirubin: 1 mg/dL (ref 0.3–1.2)
Total Protein: 6.7 g/dL (ref 6.5–8.1)

## 2021-05-30 LAB — RESPIRATORY PANEL BY PCR

## 2021-05-30 LAB — APTT
aPTT: 163 seconds (ref 24–36)
aPTT: 162 seconds (ref 24–36)
aPTT: 113 seconds — ABNORMAL HIGH (ref 24–36)
aPTT: 96 seconds — ABNORMAL HIGH (ref 24–36)

## 2021-05-30 LAB — POCT I-STAT 7, (LYTES, BLD GAS, ICA,H+H)
Acid-Base Excess: 2 mmol/L (ref 0.0–2.0)
Bicarbonate: 27.7 mmol/L (ref 20.0–28.0)
Calcium, Ion: 1.22 mmol/L (ref 1.15–1.40)
HCT: 37 % — ABNORMAL LOW (ref 39.0–52.0)
Hemoglobin: 12.6 g/dL — ABNORMAL LOW (ref 13.0–17.0)
O2 Saturation: 94 %
Potassium: 4.2 mmol/L (ref 3.5–5.1)
Sodium: 139 mmol/L (ref 135–145)
TCO2: 29 mmol/L (ref 22–32)
pCO2 arterial: 46.1 mmHg (ref 32–48)
pH, Arterial: 7.386 (ref 7.35–7.45)
pO2, Arterial: 72 mmHg — ABNORMAL LOW (ref 83–108)

## 2021-05-30 LAB — CBC
HCT: 39.7 % (ref 39.0–52.0)
Hemoglobin: 13.5 g/dL (ref 13.0–17.0)
MCH: 33.2 pg (ref 26.0–34.0)
MCHC: 34 g/dL (ref 30.0–36.0)
MCV: 97.5 fL (ref 80.0–100.0)
Platelets: 254 10*3/uL (ref 150–400)
RBC: 4.07 MIL/uL — ABNORMAL LOW (ref 4.22–5.81)
RDW: 15.2 % (ref 11.5–15.5)
WBC: 9.3 10*3/uL (ref 4.0–10.5)
nRBC: 0 % (ref 0.0–0.2)

## 2021-05-30 LAB — BASIC METABOLIC PANEL
Anion gap: 7 (ref 5–15)
BUN: 22 mg/dL (ref 8–23)
CO2: 28 mmol/L (ref 22–32)
Calcium: 9 mg/dL (ref 8.9–10.3)
Chloride: 102 mmol/L (ref 98–111)
Creatinine, Ser: 1.17 mg/dL (ref 0.61–1.24)
GFR, Estimated: >60 mL/min (ref >60)
Glucose, Bld: 127 mg/dL — ABNORMAL HIGH (ref 70–99)
Potassium: 4.4 mmol/L (ref 3.5–5.1)
Sodium: 137 mmol/L (ref 135–145)

## 2021-05-30 LAB — PROTIME-INR
INR: 1.5 — ABNORMAL HIGH (ref 0.8–1.2)
Prothrombin Time: 18 seconds — ABNORMAL HIGH (ref 11.4–15.2)

## 2021-05-30 LAB — HIV ANTIBODY (ROUTINE TESTING W REFLEX): HIV Screen 4th Generation wRfx: NONREACTIVE

## 2021-05-30 LAB — HEPARIN LEVEL (UNFRACTIONATED): Heparin Unfractionated: >1.1 IU/mL — ABNORMAL HIGH (ref 0.30–0.70)

## 2021-05-30 LAB — TROPONIN I (HIGH SENSITIVITY): Troponin I (High Sensitivity): 112 ng/L (ref <18)

## 2021-05-30 SURGERY — RIGHT/LEFT HEART CATH AND CORONARY ANGIOGRAPHY
Anesthesia: LOCAL

## 2021-05-30 MED ORDER — MORPHINE SULFATE (PF) 2 MG/ML IV SOLN
INTRAVENOUS | Status: DC | PRN
  Administered 2021-05-30: 1 mg via INTRAVENOUS
  Administered 2021-05-30: 2 mg via INTRAVENOUS

## 2021-05-30 MED ORDER — HEPARIN (PORCINE) IN NACL 1000-0.9 UT/500ML-% IV SOLN
INTRAVENOUS | Status: DC | PRN
  Administered 2021-05-30 (×2): 500 mL

## 2021-05-30 MED ORDER — ACETAMINOPHEN 325 MG PO TABS: 650.0000 mg | ORAL_TABLET | ORAL | Status: DC | PRN

## 2021-05-30 MED ORDER — MIDAZOLAM HCL 2 MG/2ML IJ SOLN
INTRAMUSCULAR | Status: AC
  Filled 2021-05-30: qty 2

## 2021-05-30 MED ORDER — LIDOCAINE HCL (PF) 1 % IJ SOLN
INTRAMUSCULAR | Status: DC | PRN
  Administered 2021-05-30: 5 mL

## 2021-05-30 MED ORDER — SODIUM CHLORIDE 0.9% FLUSH: 3.0000 mL | INTRAVENOUS | Status: DC | PRN

## 2021-05-30 MED ORDER — SODIUM CHLORIDE 0.9 % IV SOLN
500.0000 mL | Freq: Once | INTRAVENOUS | Status: AC
  Administered 2021-05-30: 500 mL via INTRAVENOUS

## 2021-05-30 MED ORDER — LABETALOL HCL 5 MG/ML IV SOLN: 10.0000 mg | INTRAVENOUS | Status: AC | PRN

## 2021-05-30 MED ORDER — ONDANSETRON HCL 4 MG/2ML IJ SOLN: 4.0000 mg | Freq: Four times a day (QID) | INTRAMUSCULAR | Status: DC | PRN

## 2021-05-30 MED ORDER — SODIUM CHLORIDE 0.9% FLUSH
3.0000 mL | Freq: Two times a day (BID) | INTRAVENOUS | Status: DC
  Administered 2021-05-30 – 2021-05-31 (×3): 3 mL via INTRAVENOUS

## 2021-05-30 MED ORDER — MORPHINE SULFATE (PF) 2 MG/ML IV SOLN
INTRAVENOUS | Status: AC
  Filled 2021-05-30: qty 1

## 2021-05-30 MED ORDER — HEPARIN (PORCINE) 25000 UT/250ML-% IV SOLN
1150.0000 [IU]/h | INTRAVENOUS | Status: DC
  Administered 2021-05-30: 1000 [IU]/h via INTRAVENOUS
  Filled 2021-05-30: qty 250

## 2021-05-30 MED ORDER — ASPIRIN 81 MG PO CHEW: 81.0000 mg | CHEWABLE_TABLET | ORAL | Status: DC

## 2021-05-30 MED ORDER — FENTANYL CITRATE (PF) 100 MCG/2ML IJ SOLN
INTRAMUSCULAR | Status: AC
  Filled 2021-05-30: qty 2

## 2021-05-30 MED ORDER — ASPIRIN 81 MG PO CHEW
81.0000 mg | CHEWABLE_TABLET | ORAL | Status: AC
  Administered 2021-05-30: 81 mg via ORAL
  Filled 2021-05-30: qty 1

## 2021-05-30 MED ORDER — LIDOCAINE HCL (PF) 1 % IJ SOLN
INTRAMUSCULAR | Status: AC
  Filled 2021-05-30: qty 30

## 2021-05-30 MED ORDER — SODIUM CHLORIDE 0.9% FLUSH
3.0000 mL | Freq: Two times a day (BID) | INTRAVENOUS | Status: DC
  Administered 2021-05-30 – 2021-05-31 (×2): 3 mL via INTRAVENOUS

## 2021-05-30 MED ORDER — HYDRALAZINE HCL 20 MG/ML IJ SOLN: 10.0000 mg | INTRAMUSCULAR | Status: AC | PRN

## 2021-05-30 MED ORDER — SODIUM CHLORIDE 0.9 % WEIGHT BASED INFUSION: 1.0000 mL/kg/h | INTRAVENOUS | Status: DC

## 2021-05-30 MED ORDER — HEPARIN (PORCINE) IN NACL 2-0.9 UNITS/ML
INTRAMUSCULAR | Status: DC | PRN
  Administered 2021-05-30 (×2): 10 mL via INTRA_ARTERIAL

## 2021-05-30 MED ORDER — MIDAZOLAM HCL 2 MG/2ML IJ SOLN
INTRAMUSCULAR | Status: DC | PRN
Start: 2021-05-30 — End: 2021-05-30
  Administered 2021-05-30: 2 mg via INTRAVENOUS
  Administered 2021-05-30: 1 mg via INTRAVENOUS

## 2021-05-30 MED ORDER — SODIUM CHLORIDE 0.9 % WEIGHT BASED INFUSION
3.0000 mL/kg/h | INTRAVENOUS | Status: DC
  Administered 2021-05-30: 3 mL/kg/h via INTRAVENOUS

## 2021-05-30 MED ORDER — SODIUM CHLORIDE 0.9 % IV SOLN: INTRAVENOUS | Status: AC

## 2021-05-30 MED ORDER — HEPARIN (PORCINE) IN NACL 1000-0.9 UT/500ML-% IV SOLN
INTRAVENOUS | Status: AC
  Filled 2021-05-30: qty 1000

## 2021-05-30 MED ORDER — HEPARIN SODIUM (PORCINE) 1000 UNIT/ML IJ SOLN
INTRAMUSCULAR | Status: DC | PRN
  Administered 2021-05-30: 4000 [IU] via INTRAVENOUS

## 2021-05-30 MED ORDER — SODIUM CHLORIDE 0.9 % IV SOLN: 250.0000 mL | INTRAVENOUS | Status: DC | PRN

## 2021-05-30 MED ORDER — VERAPAMIL HCL 2.5 MG/ML IV SOLN
INTRAVENOUS | Status: AC
  Filled 2021-05-30: qty 2

## 2021-05-30 MED ORDER — HEPARIN SODIUM (PORCINE) 1000 UNIT/ML IJ SOLN
INTRAMUSCULAR | Status: AC
  Filled 2021-05-30: qty 10

## 2021-05-30 SURGICAL SUPPLY — 13 items
CATH 5FR JL3.5 JR4 ANG PIG MP (CATHETERS) ×1 IMPLANT
CATH BALLN WEDGE 5F 110CM (CATHETERS) ×1 IMPLANT
DEVICE RAD COMP TR BAND LRG (VASCULAR PRODUCTS) ×1 IMPLANT
GLIDESHEATH SLEND SS 6F .021 (SHEATH) ×1 IMPLANT
GUIDEWIRE .025 260CM (WIRE) ×1 IMPLANT
GUIDEWIRE INQWIRE 1.5J.035X260 (WIRE) IMPLANT
INQWIRE 1.5J .035X260CM (WIRE) ×2
KIT HEART LEFT (KITS) ×2 IMPLANT
PACK CARDIAC CATHETERIZATION (CUSTOM PROCEDURE TRAY) ×2 IMPLANT
SHEATH GLIDE SLENDER 4/5FR (SHEATH) ×1 IMPLANT
SYR MEDRAD MARK 7 150ML (SYRINGE) ×2 IMPLANT
TRANSDUCER W/STOPCOCK (MISCELLANEOUS) ×2 IMPLANT
TUBING CIL FLEX 10 FLL-RA (TUBING) ×2 IMPLANT

## 2021-05-30 NOTE — Progress Notes (Signed)
Patient had critical value APTT 162 from redraw.  Cardiologist told verbally at bedside with patient now.  Also informed of 12 runs VT from previous strip.  Informed patient complained of dizziness this Am when turning head side to side but had no complaints when asked prior in shift. ?

## 2021-05-30 NOTE — Consult Note (Signed)
?Cardiology Consultation:  ? ?Patient ID: Eduardo Fuentes ?MRN: [id number]; DOB: 19-Nov-1950 ? ?Admit date: 05/29/2021 ?Date of Consult: 05/30/2021 ? ?PCP:  Lujean Amel, MD ?  ?[facility] ?Cardiologist:  Glenetta Hew, MD      ? ? ?Patient Profile:  ? ?Eduardo Fuentes is a 71 y.o. male with a hx of IPF, AF on eliquis, GERD, moderate CAC, nephrolithisasis, prostatic adeno who is being seen 05/30/2021 for the evaluation of chest pain at request of Dr. Florene Glen. ? ?History of Present Illness:  ? ?Eduardo Fuentes is a 71 year old male with past medical history of IPF, AF on eliquis, GERD, moderate CAC, nephrolithisasis, prostatic adeno who presented to his outpatient pulmonologist with complaints of shortness of breath.  His pulmonolgist checked a troponin which was positive and he was told to present to ED.  Serial values have been 187 and 198. ? ?He denies chest pain.  He notes that for two weeks we has had both resting DOE and decrease ability to do ADLs.  He had hoped his OFEV would delay progression.  No LE Edema, but orthopnea.  He can't get in the driveway without SOB.  His wife had recommended sooner evaluation after home pulse Ox was 70; after seeing his pulmonologist, he was recommended for ED eval. ? ?He was evaluated for cardiovascular risk with a 3 vessel CAC score previously.  TTE earlier in February showed normal LVEF with G1DD, moderate RV dysfunction, inadequate PA pressures, and a small posterior LV that looks hemodynamically insignficant. ? ?ECG is changed from prior with T-wave inversions across the precordium.  A high res CT chest was performed  (I think there is som interval increase in honeycombing appearance from 06/21/20 CT).  He's been started on furosemide 40 mg IV BID and is about half a liter negative and responded to the first dose. ? ?This AM he had a 12 beat run on NSVT. Otherwise SR with rare PVCs. ? ?Past Medical History:  ?Diagnosis Date  ? Agatston CAC score, <100 04/2018  ?  Arthritis   ? NECK  ? Cough   ? NON-PRODUCTIVE  ? Depressive disorder 10/2011  ? follow by dr Toy Care psychiatry; has anxiety and panic disorder as well.  ? Diverticulosis   ? Diverticulosis of colon   ? Followed by Dr. Henrene Pastor  ? Essential hypertension   ? Generalized anxiety disorder   ? GERD (gastroesophageal reflux disease)   ? WATCHES DIET  ? Hemorrhoid   ? Hiatal hernia   ? Describes a sliding hiatal hernia  ? History of colon polyps   ? History of kidney stones   ? Idiopathic pulmonary fibrosis ([place])   ? Microscopic hematuria   ? Nocturia   ? Panic attacks   ? Prostate cancer ([place]) 07/2012  ? Dr. Arnell Sieving urology.  ? Sinus infection   ? Tinnitus   ? LEFT EAR -- CHRONIC  ? ? ?Past Surgical History:  ?Procedure Laterality Date  ? COLONOSCOPY  03/04/2013  ? ESOPHAGOGASTRODUODENOSCOPY    ? EXTRACORPOREAL SHOCK WAVE LITHOTRIPSY  YRS AGO  ? PROSTATE BIOPSY    ? RADIOACTIVE SEED IMPLANT N/A 02/02/2013  ? Procedure: RADIOACTIVE SEED IMPLANT;  Surgeon: Molli Hazard, MD;  Location: [facility];  Service: Urology;  Laterality: N/A;  ? TONSILLECTOMY AND ADENOIDECTOMY  AS CHILD  ? TRANSTHORACIC ECHOCARDIOGRAM  06/22/2020  ? LVEF 60-65%. No RWMA. Moderate Basal0-Septal Asymmetric LVH. Gr1 DD. Normal RV size & Fxn with normal RVP/RAP.  Mild AoV Sclerosis w/o Stensosis. Ao Root 40 mm (mild dilation)  ? UPPER GASTROINTESTINAL ENDOSCOPY  2019  ?  ? ?Home Medications:  ?Prior to Admission medications   ?Medication Sig Start Date End Date Taking? Authorizing Provider  ?apixaban (ELIQUIS) 5 MG TABS tablet Take 1 tablet (5 mg total) by mouth 2 (two) times daily. 04/25/21  Yes Fenton, Clint R, PA  ?clonazePAM (KLONOPIN) 1 MG tablet Take 1 tablet at 8 am, 1/2 tablet at 2 pm and 1 tablet at bedtime for anxiety. ?Patient taking differently: Take 0.5-1 mg by mouth See admin instructions. Take 1 mg by mouth in the morning and at bedtime- may take an additional 0.5-1 mg during the day as needed for anxiety  11/05/11  Yes Readling, Milana Huntsman, MD  ?diltiazem (CARDIZEM CD) 120 MG 24 hr capsule TAKE 1 CAPSULE(120 MG) BY MOUTH DAILY ?Patient taking differently: Take 120 mg by mouth in the morning. 02/20/21  Yes Fenton, Clint R, PA  ?dimenhyDRINATE (DRAMAMINE) 50 MG tablet Take 50 mg by mouth every 8 (eight) hours as needed for nausea (or vomiting- when not taking Zofran).   Yes [provider]  ?gabapentin (NEURONTIN) 300 MG capsule Take 600 mg by mouth in the morning and at bedtime.   Yes [provider]  ?mirtazapine (REMERON) 30 MG tablet Take 30 mg by mouth at bedtime.  12/22/17  Yes [provider]  ?Nintedanib (OFEV) 150 MG CAPS TAKE 1 CAPSULE (150 MG TOTAL) BY MOUTH 2 (TWO) TIMES DAILY. ?Patient taking differently: Take 150 mg by mouth in the morning and at bedtime. 03/01/21  Yes Brand Males, MD  ?ondansetron (ZOFRAN) 4 MG tablet TAKE 1 TO 2 TABLETS BY MOUTH WITH OFEV ?Patient taking differently: Take 4-8 mg by mouth daily as needed for nausea or vomiting. 05/21/21  Yes Irene Shipper, MD  ?OXYGEN Inhale 2-8 L/min into the lungs See admin instructions. 2 L/min of oxygen when at rest and 8 L/min during any exertion continuously   Yes [provider]  ?pantoprazole (PROTONIX) 40 MG tablet TAKE 1 TABLET(40 MG) BY MOUTH DAILY ?Patient taking differently: Take 40 mg by mouth at bedtime. 03/04/21  Yes Irene Shipper, MD  ?psyllium (METAMUCIL) 58.6 % powder Take 1 packet by mouth at bedtime.   Yes [provider]  ?rosuvastatin (CRESTOR) 20 MG tablet Take 1 tablet (20 mg total) by mouth daily. 08/29/20 05/29/21 Yes Caron Presume K, PA-C  ?TYLENOL 500 MG tablet Take 1,000 mg by mouth every 6 (six) hours as needed for mild pain or headache.   Yes [provider]  ? ? ?Inpatient Medications: ?Scheduled Meds: ? azithromycin  500 mg Oral Daily  ? budesonide (PULMICORT) nebulizer solution  0.5 mg Nebulization BID  ? Chlorhexidine Gluconate Cloth  6 each Topical Daily  ? clonazePAM   0.5 mg Oral Q1400  ? clonazePAM  1 mg Oral 2 times per day  ? diltiazem  120 mg Oral q AM  ? furosemide  40 mg Intravenous BID  ? gabapentin  600 mg Oral BID  ? mouth rinse  15 mL Mouth Rinse BID  ? methylPREDNISolone (SOLU-MEDROL) injection  40 mg Intravenous Q12H  ? mirtazapine  30 mg Oral QHS  ? Nintedanib  150 mg Oral BID  ? pantoprazole  40 mg Oral QHS  ? rosuvastatin  20 mg Oral Daily  ? ?Continuous Infusions: ? cefTRIAXone (ROCEPHIN)  IV Stopped (05/29/21 2034)  ? heparin 1,100 Units/hr (05/30/21 0300)  ? ?PRN Meds: ?  acetaminophen **OR** acetaminophen, dimenhyDRINATE ? ?Allergies:    ?Allergies  ?Allergen Reactions  ? Aleve [Naproxen Sodium] Anaphylaxis, Nausea Only and Other (See Comments)  ?  GI UPSET- Patient does not remember "anaphylaxis" in 2023  ? Demerol [Meperidine] Rash  ?   ?  ? Hyzaar [Losartan Potassium-Hctz] Other (See Comments)  ?  "Felt badly" ?  ? Lisinopril Cough  ? ? ?Social History:   ?Social History  ? ?Socioeconomic History  ? Marital status: Married  ?  Spouse name: [name]  ? Number of children: 0  ? Years of education: Not on file  ? Highest education level: Not on file  ?Occupational History  ? Occupation: retired  ?Tobacco Use  ? Smoking status: Never  ? Smokeless tobacco: Never  ?Vaping Use  ? Vaping Use: Never used  ?Substance and Sexual Activity  ? Alcohol use: No  ? Drug use: No  ? Sexual activity: Never  ?Other Topics Concern  ? Not on file  ?Social History Narrative  ? Pt lives in single story home with is wife  ? 12th grade education  ? 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 Maybe 2-3 alcoholic beverages a month.  No caffeine.  ? ?Social Determinants of Health  ? ?Financial Resource Strain: Not on file  ?Food Insecurity: Not on file  ?Transportation Needs: Not on file  ?Physical Activity: Not on file  ?Stress: Not on file  ?Social Connections: Not on file  ?Intimate Partner Violence: Not on file  ?  ?Family History:   ? ?Family History  ?Problem  Relation Age of Onset  ? Dementia Mother   ? Anxiety disorder Mother   ? Alzheimer's disease Mother   ? Bone cancer Father   ?     Metastatic prostate  ? Cancer Father   ?     prostate  ? Heart attack Father 13  ?

## 2021-05-30 NOTE — Progress Notes (Addendum)
ANTICOAGULATION CONSULT NOTE  ? ?Pharmacy Consult for Heparin while Eliquis on hold ?Indication: atrial fibrillation, elevated troponin ? ?Allergies  ?Allergen Reactions  ? Aleve [Naproxen Sodium] Anaphylaxis, Nausea Only and Other (See Comments)  ?  GI UPSET- Patient does not remember "anaphylaxis" in 2023  ? Demerol [Meperidine] Rash  ?   ?  ? Hyzaar [Losartan Potassium-Hctz] Other (See Comments)  ?  "Felt badly" ?  ? Lisinopril Cough  ? ? ?Patient Measurements: ?Height: '5\' 9"'$  (175.3 cm) ?Weight: 76.5 kg (168 lb 10.4 oz) ?IBW/kg (Calculated) : 70.7 ?Heparin Dosing Weight: 77.9 kg ? ?Vital Signs: ?Temp: 97.4 ?F (36.3 ?C) (04/06 0800) ?Temp Source: Oral (04/06 0800) ?BP: 151/96 (04/06 0800) ?Pulse Rate: 60 (04/06 0800) ? ?Labs: ?Recent Labs  ?  05/29/21 ?1045 05/29/21 ?1432 05/29/21 ?1725 05/29/21 ?1831 05/29/21 ?1831 05/30/21 ?5465 05/30/21 ?0354 05/30/21 ?0406 05/30/21 ?6568  ?HGB 13.0  --   --   --   --  13.5  --   --   --   ?HCT 38.2*  --   --   --   --  39.7  --   --   --   ?PLT 224.0  --   --   --   --  254  --   --   --   ?APTT  --   --   --  38*   < >  --  163* 162* 113*  ?LABPROT  --   --   --  21.2*  --   --   --   --   --   ?INR  --   --   --  1.9*  --   --   --   --   --   ?HEPARINUNFRC  --   --   --  >1.10*  --   --  >1.10*  --   --   ?CREATININE 1.57*  --   --   --   --  1.53*  --   --   --   ?TROPONINIHS  --  187* 198*  --   --   --   --   --  112*  ? < > = values in this interval not displayed.  ? ? ? ?Estimated Creatinine Clearance: 44.3 mL/min (A) (by C-G formula based on SCr of 1.53 mg/dL (H)). ? ? ?Medical History: ? ? ?Medications:  ?Scheduled:  ? ?Infusions:  ? cefTRIAXone (ROCEPHIN)  IV Stopped (05/29/21 2034)  ? heparin 1,100 Units/hr (05/30/21 0800)  ? ?PRN:  ? ?Assessment: ?71 yo male with hx PAF on chronic Eliquis presents with shortness of breath and new O2 requirements.  Pharmacy is consulted to dose IV heparin for NSTEMI. ? ?Last dose of Eliquis today (4/5) at 07:30.   ? ?APTTs elevated  overnight, but were not drawn at ordered times, drawn closer to heparin bolus than intended.  Planning for cath today. ? ? ?Goal of Therapy:  ?Heparin level 0.3-0.7 units/ml ?PTT 66-102 seconds ?Monitor platelets by anticoagulation protocol: Yes ?  ?Plan:  ?Reduce IV heparin to 1000 units/hr for now. ?Recheck aPTT in 8 hrs. ?F/u plans for heparin after cath today vs. Resuming Eliquis. ? ?N[place]ada Crane, Pharm D, BCPS, BCCP ?Clinical Pharmacist ? 05/30/2021 8:36 AM  ? ?[facility] pharmacy phone numbers are listed on [email or web address] ? ? ? ?

## 2021-05-30 NOTE — Plan of Care (Signed)
Error - see consult note

## 2021-05-30 NOTE — Interval H&P Note (Signed)
Cath Lab Visit (complete for each Cath Lab visit) ? ?Clinical Evaluation Leading to the Procedure:  ? ?ACS: Yes.   ? ?Non-ACS:   ? ?Anginal Classification: CCS IV ? ?Anti-ischemic medical therapy: Minimal Therapy (1 class of medications) ? ?Non-Invasive Test Results: No non-invasive testing performed ? ?Prior CABG: No previous CABG ? ? ? ? ? ?History and Physical Interval Note: ? ?05/30/2021 ?3:18 PM ? ?Eduardo Fuentes  has presented today for surgery, with the diagnosis of nstemi.  The various methods of treatment have been discussed with the patient and family. After consideration of risks, benefits and other options for treatment, the patient has consented to  Procedure(s): ?RIGHT/LEFT HEART CATH AND CORONARY ANGIOGRAPHY (N/A) as a surgical intervention.  The patient's history has been reviewed, patient examined, no change in status, stable for surgery.  I have reviewed the patient's chart and labs.  Questions were answered to the patient's satisfaction.   ? ? ?Larae Grooms ? ? ?

## 2021-05-30 NOTE — Plan of Care (Signed)
Discussed with patient plan of care for the evening, pain management and TR band removal with some teach back displayed ? ?Problem: Education: ?Goal: Knowledge of General Education information will improve ?Description: Including pain rating scale, medication(s)/side effects and non-pharmacologic comfort measures ?Outcome: Progressing ?  ?Problem: Activity: ?Goal: Risk for activity intolerance will decrease ?Outcome: Progressing ?  ?

## 2021-05-30 NOTE — Progress Notes (Signed)
TR band air deflation completed. TR band removed, 2x2 gauze applied and tegaderm applied. No bleeding noted. ?

## 2021-05-30 NOTE — Progress Notes (Signed)
Patient had critical PTT 163 (previous value 38).  Stat recheck on arm not receiving Heparin gtt ordered.  MD will be informed of new result at that time. ?

## 2021-05-30 NOTE — Progress Notes (Signed)
Transported to the cath. Lab by bed awake and alert. 

## 2021-05-30 NOTE — H&P (View-Only) (Signed)
?Cardiology Consultation:  ? ?Patient ID: Eduardo Fuentes ?MRN: [id number]; DOB: 1950/08/08 ? ?Admit date: 05/29/2021 ?Date of Consult: 05/30/2021 ? ?PCP:  Lujean Amel, MD ?  ?[facility] ?Cardiologist:  Glenetta Hew, MD      ? ? ?Patient Profile:  ? ?Eduardo Fuentes is a 71 y.o. male with a hx of IPF, AF on eliquis, GERD, moderate CAC, nephrolithisasis, prostatic adeno who is being seen 05/30/2021 for the evaluation of chest pain at request of Dr. Florene Glen. ? ?History of Present Illness:  ? ?Eduardo Fuentes is a 71 year old male with past medical history of IPF, AF on eliquis, GERD, moderate CAC, nephrolithisasis, prostatic adeno who presented to his outpatient pulmonologist with complaints of shortness of breath.  His pulmonolgist checked a troponin which was positive and he was told to present to ED.  Serial values have been 187 and 198. ? ?He denies chest pain.  He notes that for two weeks we has had both resting DOE and decrease ability to do ADLs.  He had hoped his OFEV would delay progression.  No LE Edema, but orthopnea.  He can't get in the driveway without SOB.  His wife had recommended sooner evaluation after home pulse Ox was 70; after seeing his pulmonologist, he was recommended for ED eval. ? ?He was evaluated for cardiovascular risk with a 3 vessel CAC score previously.  TTE earlier in February showed normal LVEF with G1DD, moderate RV dysfunction, inadequate PA pressures, and a small posterior LV that looks hemodynamically insignficant. ? ?ECG is changed from prior with T-wave inversions across the precordium.  A high res CT chest was performed  (I think there is som interval increase in honeycombing appearance from 06/21/20 CT).  He's been started on furosemide 40 mg IV BID and is about half a liter negative and responded to the first dose. ? ?This AM he had a 12 beat run on NSVT. Otherwise SR with rare PVCs. ? ?Past Medical History:  ?Diagnosis Date  ? Agatston CAC score, <100 04/2018  ?  Arthritis   ? NECK  ? Cough   ? NON-PRODUCTIVE  ? Depressive disorder 10/2011  ? follow by dr Toy Care psychiatry; has anxiety and panic disorder as well.  ? Diverticulosis   ? Diverticulosis of colon   ? Followed by Dr. Henrene Pastor  ? Essential hypertension   ? Generalized anxiety disorder   ? GERD (gastroesophageal reflux disease)   ? WATCHES DIET  ? Hemorrhoid   ? Hiatal hernia   ? Describes a sliding hiatal hernia  ? History of colon polyps   ? History of kidney stones   ? Idiopathic pulmonary fibrosis ([employer])   ? Microscopic hematuria   ? Nocturia   ? Panic attacks   ? Prostate cancer ([place]) 07/2012  ? Dr. Arnell Sieving urology.  ? Sinus infection   ? Tinnitus   ? LEFT EAR -- CHRONIC  ? ? ?Past Surgical History:  ?Procedure Laterality Date  ? COLONOSCOPY  03/04/2013  ? ESOPHAGOGASTRODUODENOSCOPY    ? EXTRACORPOREAL SHOCK WAVE LITHOTRIPSY  YRS AGO  ? PROSTATE BIOPSY    ? RADIOACTIVE SEED IMPLANT N/A 02/02/2013  ? Procedure: RADIOACTIVE SEED IMPLANT;  Surgeon: Molli Hazard, MD;  Location: [facility];  Service: Urology;  Laterality: N/A;  ? TONSILLECTOMY AND ADENOIDECTOMY  AS CHILD  ? TRANSTHORACIC ECHOCARDIOGRAM  06/22/2020  ? LVEF 60-65%. No RWMA. Moderate Basal0-Septal Asymmetric LVH. Gr1 DD. Normal RV size & Fxn with normal RVP/RAP.  Mild AoV Sclerosis w/o Stensosis. Ao Root 40 mm (mild dilation)  ? UPPER GASTROINTESTINAL ENDOSCOPY  2019  ?  ? ?Home Medications:  ?Prior to Admission medications   ?Medication Sig Start Date End Date Taking? Authorizing Provider  ?apixaban (ELIQUIS) 5 MG TABS tablet Take 1 tablet (5 mg total) by mouth 2 (two) times daily. 04/25/21  Yes Fenton, Clint R, PA  ?clonazePAM (KLONOPIN) 1 MG tablet Take 1 tablet at 8 am, 1/2 tablet at 2 pm and 1 tablet at bedtime for anxiety. ?Patient taking differently: Take 0.5-1 mg by mouth See admin instructions. Take 1 mg by mouth in the morning and at bedtime- may take an additional 0.5-1 mg during the day as needed for anxiety  11/05/11  Yes Readling, Milana Huntsman, MD  ?diltiazem (CARDIZEM CD) 120 MG 24 hr capsule TAKE 1 CAPSULE(120 MG) BY MOUTH DAILY ?Patient taking differently: Take 120 mg by mouth in the morning. 02/20/21  Yes Fenton, Clint R, PA  ?dimenhyDRINATE (DRAMAMINE) 50 MG tablet Take 50 mg by mouth every 8 (eight) hours as needed for nausea (or vomiting- when not taking Zofran).   Yes [provider]  ?gabapentin (NEURONTIN) 300 MG capsule Take 600 mg by mouth in the morning and at bedtime.   Yes [provider]  ?mirtazapine (REMERON) 30 MG tablet Take 30 mg by mouth at bedtime.  12/22/17  Yes [provider]  ?Nintedanib (OFEV) 150 MG CAPS TAKE 1 CAPSULE (150 MG TOTAL) BY MOUTH 2 (TWO) TIMES DAILY. ?Patient taking differently: Take 150 mg by mouth in the morning and at bedtime. 03/01/21  Yes Brand Males, MD  ?ondansetron (ZOFRAN) 4 MG tablet TAKE 1 TO 2 TABLETS BY MOUTH WITH OFEV ?Patient taking differently: Take 4-8 mg by mouth daily as needed for nausea or vomiting. 05/21/21  Yes Irene Shipper, MD  ?OXYGEN Inhale 2-8 L/min into the lungs See admin instructions. 2 L/min of oxygen when at rest and 8 L/min during any exertion continuously   Yes [provider]  ?pantoprazole (PROTONIX) 40 MG tablet TAKE 1 TABLET(40 MG) BY MOUTH DAILY ?Patient taking differently: Take 40 mg by mouth at bedtime. 03/04/21  Yes Irene Shipper, MD  ?psyllium (METAMUCIL) 58.6 % powder Take 1 packet by mouth at bedtime.   Yes [provider]  ?rosuvastatin (CRESTOR) 20 MG tablet Take 1 tablet (20 mg total) by mouth daily. 08/29/20 05/29/21 Yes Caron Presume K, PA-C  ?TYLENOL 500 MG tablet Take 1,000 mg by mouth every 6 (six) hours as needed for mild pain or headache.   Yes [provider]  ? ? ?Inpatient Medications: ?Scheduled Meds: ? azithromycin  500 mg Oral Daily  ? budesonide (PULMICORT) nebulizer solution  0.5 mg Nebulization BID  ? Chlorhexidine Gluconate Cloth  6 each Topical Daily  ? clonazePAM   0.5 mg Oral Q1400  ? clonazePAM  1 mg Oral 2 times per day  ? diltiazem  120 mg Oral q AM  ? furosemide  40 mg Intravenous BID  ? gabapentin  600 mg Oral BID  ? mouth rinse  15 mL Mouth Rinse BID  ? methylPREDNISolone (SOLU-MEDROL) injection  40 mg Intravenous Q12H  ? mirtazapine  30 mg Oral QHS  ? Nintedanib  150 mg Oral BID  ? pantoprazole  40 mg Oral QHS  ? rosuvastatin  20 mg Oral Daily  ? ?Continuous Infusions: ? cefTRIAXone (ROCEPHIN)  IV Stopped (05/29/21 2034)  ? heparin 1,100 Units/hr (05/30/21 0300)  ? ?PRN Meds: ?  acetaminophen **OR** acetaminophen, dimenhyDRINATE ? ?Allergies:    ?Allergies  ?Allergen Reactions  ? Aleve [Naproxen Sodium] Anaphylaxis, Nausea Only and Other (See Comments)  ?  GI UPSET- Patient does not remember "anaphylaxis" in 2023  ? Demerol [Meperidine] Rash  ?   ?  ? Hyzaar [Losartan Potassium-Hctz] Other (See Comments)  ?  "Felt badly" ?  ? Lisinopril Cough  ? ? ?Social History:   ?Social History  ? ?Socioeconomic History  ? Marital status: Married  ?  Spouse name: [name]  ? Number of children: 0  ? Years of education: Not on file  ? Highest education level: Not on file  ?Occupational History  ? Occupation: retired  ?Tobacco Use  ? Smoking status: Never  ? Smokeless tobacco: Never  ?Vaping Use  ? Vaping Use: Never used  ?Substance and Sexual Activity  ? Alcohol use: No  ? Drug use: No  ? Sexual activity: Never  ?Other Topics Concern  ? Not on file  ?Social History Narrative  ? Pt lives in single story home with is wife  ? 12th grade education  ? 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 Maybe 2-3 alcoholic beverages a month.  No caffeine.  ? ?Social Determinants of Health  ? ?Financial Resource Strain: Not on file  ?Food Insecurity: Not on file  ?Transportation Needs: Not on file  ?Physical Activity: Not on file  ?Stress: Not on file  ?Social Connections: Not on file  ?Intimate Partner Violence: Not on file  ?  ?Family History:   ? ?Family History  ?Problem  Relation Age of Onset  ? Dementia Mother   ? Anxiety disorder Mother   ? Alzheimer's disease Mother   ? Bone cancer Father   ?     Metastatic prostate  ? Cancer Father   ?     prostate  ? Heart attack Father 22  ?

## 2021-05-30 NOTE — Progress Notes (Signed)
?PROGRESS NOTE ? ? ? ?Eduardo Fuentes  IHK:[id number] DOB: 1950-06-20 DOA: 05/29/2021 ?PCP: Lujean Amel, MD  ? ?Brief Narrative:  ?HPI per Dr. Alben Deeds ?HPI: Eduardo Fuentes is a 71 y.o. 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 ?  ?He notes for past 2-3 weeks, feels like switch was flipped.  He's been more SOB with exertion.  Prior to 3 weeks ago, was able to line his driveway with bricks without issue.  He used to pick up his dog after he took her out.  In the past 2-3 weeks, he's unable to pick up his dog anymore due to the SOB.  He's noticing much more SOB with typical tasks. Denies orthopnea, notes PND, denies edema.  Notes occasional dull CP, improves after a few minutes.  Sometimes improves after rubbing it.  L sided.  No clear exacerbating features.  Denies smoking or etoh use. ?  ?Clinic and ED Course: Seen in pulmonary clinic by Geraldo Pitter, noted to be hypoxic, discharge on 6-8 L with activity, labs collected (troponin, BNP, CBC, BMP, HFP, troponin, TSH, D dimer).  Troponin and BNP were elevated.  EDP discussed with cardiology who recommended medical admission to [facility]. ? ?**Interim History ?She was transferred to [facility] and evaluated by cardiology and pulmonary.  Given cardiology evaluation they felt the patient would benefit from a left and right heart cath given that it is unclear if patient had demand from hypoxia or plaque rupture event given that his peak troponin and his T wave inversion ectopy.  Right heart cath was indicated to assess his volume status.  Cardiology recommended continuing statin and Lasix for now and he was continued on heparin drip.  Patient had acute hypoxic respiratory failure and the pulmonology team felt it is most likely from acute pulmonary  edema and recommended continuing a negative fluid balance and discontinued antibiotics and Solu-Medrol.  He follows with Dr. Chase Caller for his IPF/UIP.   ? ? ?Assessment and Plan: ?* Shortness of breath ?- See below ? ?Acute respiratory failure with hypoxia ([place]) ?-Hx IPF on OFEV  ?-Not on home O2 ?-Today, required 6-8 L to maintain sats >90% ?-SpO2: 94 % ?O2 Flow Rate (L/min): 3 L/min ?-CXR with cardiomegaly, possibly superimposed interstitial pneumonia, possible small R effusion ?-Negative D dimer ?-Discussed with Dr. Chase Caller, recommended HRCT, steroids, inpatient pulm c/s ?-Will trial diuresis with 40 mg lasix x1, follow response ?-Now pulmonary thinks it is related to acute pulmonary edema.  Patient is getting a right and left heart cath today ?-Continue with IV Lasix and pulmonary stopping steroids and antibiotics ?-continue supplemental oxygen via nasal cannula and wean O2 as tolerated ?-Continuous pulse oximetry maintain O2 saturations greater than 92% ?-We will need ambulatory home O2 screen prior to discharge and repeat chest x-ray in a.m. ? ?IPF (idiopathic pulmonary fibrosis) ([place]) ?- Follows with Dr. Chase Caller in the pulmonary office and was initially diagnosed back in December 2018 and he was tried on multiple medications but they were discontinued due to his weight loss but then he was restarted on OFEV ? ?Elevated troponin ?NSTEMI ?-Initial 216 -> 187 -> 112 ?-Described intermittent L sided CP lasting minutes starting 2-3 weeks ago - no clear exacerbating features.   ?-EKG showed T wave inversions in aVF, V2-V6 which appear new ?-  [facility]  -Hold eliquis, will start heparin gtt.  ?-Patient is undergoing a cardiac catheterization today and a right and left heart cath ? ? ?AKI (acute kidney injury) ([place]) ?-Follow with diuresis ?-Patient's BUNs/creatinine improved and went from 20/1.53 is now 22/1.17 ?-Avoid further nephrotoxic medications, contrast dyes, hypotension and  renally adjust medications ?-Repeat CMP in a.m. ? ?(HFpEF) heart failure with preserved ejection fraction ([place]) ?-Elevated BNP ?-Not grossly overloaded ?-Trial lasix 40 x1 and follow continue with diuresis per cardiology recommendations ?-Strict I&O, daily weights ?-Patient is going to get a right and left heart cath for further evaluation ? ?Atrial fibrillation, chronic ([place]) ?-Continue diltiazem ?-Hold eliquis, heparin for now ?-Cardiology is following and patient undergoing a right and left heart cath ? ?Generalized anxiety disorder ?=Continue klonopin ? ?Dyslipidemia ?-C/w Statin ? ? ?DVT prophylaxis: Heparin gtt ? ?  Code Status: DNR ?Family Communication: No family present at bedside ? ?Disposition Plan:  ?Level of care: Progressive ?Status is: Inpatient ?Remains inpatient appropriate because: His further cardiac and pulmonary work-up ?  ?Consultants:  ?Cardiology  ?Pulmonary ? ?Procedures:  ?LHC and RHC ? ?Antimicrobials:  ?Anti-infectives (From admission, onward)  ? ? Start     Dose/Rate Route Frequency Ordered Stop  ? 05/29/21 1830  cefTRIAXone (ROCEPHIN) 2 g in sodium chloride 0.9 % 100 mL IVPB  Status:  Discontinued       ? 2 g ?200 mL/hr over 30 Minutes Intravenous Every 24 hours 05/29/21 1815 05/30/21 1109  ? 05/29/21 1830  azithromycin (ZITHROMAX) tablet 500 mg  Status:  Discontinued       ? 500 mg Oral Daily 05/29/21 1815 05/30/21 1109  ? ?  ?  ?Subjective: ?Seen and examined at bedside and was hungry.  States his oxygen has been weaning.  States he is never worn oxygen before.  Denies any lightheadedness or dizziness.  No chest pain currently.  No nausea or vomiting.  No other concerns or complaints at this time. ? ?Objective: ?Vitals:  ? 05/30/21 0800 05/30/21 1100 05/30/21 1200 05/30/21 1521  ?BP: (!) 151/96 (!) 148/88 (!) 147/92   ?Pulse: 60 67 61   ?Resp: 16 (!) 22 19   ?Temp: (!) 97.4 ?F (36.3 ?C)     ?TempSrc: Oral     ?SpO2: 97% 94% 96% 94%  ?Weight:      ?Height:      ? ? ?Intake/Output  Summary (Last 24 hours) at 05/30/2021 1620 ?Last data filed at 05/30/2021 1500 ?Gross per 24 hour  ?Intake 2254.21 ml  ?Output 1500 ml  ?Net 754.21 ml  ? ?Filed Weights  ? 05/29/21 1945 05/29/21 2017 05/30/21 0500  ?Weight: 77.9 kg 75.8 kg 76.5 kg  ? ?Examination: ?Physical Exam: ? ?Constitutional: WN/WD Caucasian male currently no acute distress ?Respiratory: Diminished to auscultation bilaterally with coarse breath sounds and some crackles, no wheezing, rales, rhonchi. Normal respiratory effort and patient is not tachypenic. No accessory muscle use.  Wearing supplemental oxygen via nasal cannula ?Cardiovascular: RRR, no murmurs / rubs / gallops. S1 and S2 auscultated.  Mild extremity edema ?Abdomen: Soft, non-tender, non-distended. Bowel sounds positive.  ?GU: Deferred. ?Musculoskeletal: No clubbing / cyanosis of digits/nails. No joint deformity upper and lower extremities. ?Neurologic: CN 2-12 grossly intact with no focal deficits. Romberg sign and cerebellar reflexes not assessed.  ?Psychiatric: Normal judgment and insight. Alert and oriented x 3. Normal mood and appropriate affect.  ? ?Data Reviewed: I have personally reviewed following labs and imaging studies ? ?CBC: ?  Recent Labs  ?Lab 05/29/21 ?1045 05/30/21 ?0165 05/30/21 ?1528 05/30/21 ?1536 05/30/21 ?1537  ?WBC 7.5 9.3  --   --   --   ?NEUTROABS 5.6  --   --   --   --   ?HGB 13.0 13.5 12.6* 12.6* 12.6*  ?HCT 38.2* 39.7 37.0* 37.0* 37.0*  ?MCV 97.5 97.5  --   --   --   ?PLT 224.0 254  --   --   --   ? ?Basic Metabolic Panel: ?Recent Labs  ?Lab 05/29/21 ?1045 05/30/21 ?5374 05/30/21 ?1212 05/30/21 ?1528 05/30/21 ?1536 05/30/21 ?1537  ?NA 137 136 137 139 140 139  ?K 4.6 4.6 4.4 4.2 4.2 4.3  ?CL 102 101 102  --   --   --   ?CO2 '29 25 28  '$ --   --   --   ?GLUCOSE 92 134* 127*  --   --   --   ?BUN '22 20 22  '$ --   --   --   ?CREATININE 1.57* 1.53* 1.17  --   --   --   ?CALCIUM 9.1 8.7* 9.0  --   --   --   ? ?GFR: ?Estimated Creatinine Clearance: 57.9 mL/min (by C-G  formula based on SCr of 1.17 mg/dL). ?Liver Function Tests: ?Recent Labs  ?Lab 05/29/21 ?1045 05/30/21 ?8270  ?AST 34 37  ?ALT 20 27  ?ALKPHOS 66 68  ?BILITOT 1.1 1.0  ?PROT 6.5 6.7  ?ALBUMIN 3.7 3.2*  ? ?No

## 2021-05-30 NOTE — Assessment & Plan Note (Addendum)
-  Follows with Dr. Chase Caller in the pulmonary office and was initially diagnosed back in December 2018 and he was tried on multiple medications but they were discontinued due to his weight loss but then he was restarted on OFEV ?-Has an ILD Flare and Pulmonary recommending Supportive Care ?

## 2021-05-30 NOTE — Hospital Course (Addendum)
HPI per Dr. Alben Deeds ?HPI: Eduardo Fuentes is a 71 y.o. 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 ?  ?He notes for past 2-3 weeks, feels like switch was flipped.  He's been more SOB with exertion.  Prior to 3 weeks ago, was able to line his driveway with bricks without issue.  He used to pick up his dog after he took her out.  In the past 2-3 weeks, he's unable to pick up his dog anymore due to the SOB.  He's noticing much more SOB with typical tasks. Denies orthopnea, notes PND, denies edema.  Notes occasional dull CP, improves after a few minutes.  Sometimes improves after rubbing it.  L sided.  No clear exacerbating features.  Denies smoking or etoh use. ?  ?Clinic and ED Course: Seen in pulmonary clinic by Geraldo Pitter, noted to be hypoxic, discharge on 6-8 L with activity, labs collected (troponin, BNP, CBC, BMP, HFP, troponin, TSH, D dimer).  Troponin and BNP were elevated.  EDP discussed with cardiology who recommended medical admission to [facility]. ? ?**Interim History ?He was transferred to [facility] and evaluated by cardiology and pulmonary.  Given cardiology evaluation they felt the patient would benefit from a left and right heart cath given that it is unclear if patient had demand from hypoxia or plaque rupture event given that his peak troponin and his T wave inversion ectopy.  Right heart cath was indicated to assess his volume status.  Cardiology recommended continuing statin and Lasix for now and he was continued on heparin drip.  Patient had acute hypoxic respiratory failure and the pulmonology team felt it is most likely from acute pulmonary edema and recommended continuing a negative fluid balance and discontinued antibiotics and Solu-Medrol.  He follows with Dr. Chase Caller  for his IPF/UIP.  ? ?Cath was done and he had no evidence of obstructive CAD and he had a right atrial pressure of 5 mm per mercury.  Cardiology recommended transitioning to Lasix 20 mg p.o. daily and continue diltiazem.  We are going to resume his Eliquis now given that pulmonary is no further procedures planned and we will obtain PT OT evaluate and do an ambulatory home O2 screen.  Cardiology has signed off the case and recommending maintenance diuretics.  Pulmonary evaluated and recommending continuing his OFEV and holding off on resuming steroids and antibiotics at this time. ?

## 2021-05-30 NOTE — Assessment & Plan Note (Addendum)
See below

## 2021-05-30 NOTE — Progress Notes (Signed)
Back from the cath lab by bed awake and alert. Instructed to avoid moving right arm and keep elevated with pillow. Pulse ox to right thumb in placed. ?

## 2021-05-30 NOTE — Progress Notes (Signed)
? ?NAME:  Eduardo Fuentes, MRN:  [id number], DOB:  Jul 14, 1950, LOS: 1 ?ADMISSION DATE:  05/29/2021, CONSULTATION DATE:  05/29/2021 ?REFERRING MD:  Dr. Florene Glen, [facility], CHIEF COMPLAINT:  Short of breath  ? ?History of Present Illness:  ?71 yo male with hx of IPF/UIP on nintedanib developed sudden worsening of dyspnea about 2 weeks prior to admission.  This started after he unloaded bricks for a home project he was working on.  He also had chest pressure.  He did not need supplemental oxygen at home prior to admission, but noted that his SpO2 at rest was in the 70's before he was admitted.  He was seen in pulmonary office on 05/29/21.  Lab testing showed elevated troponin and he was admitted to hospital for further assessment.   ? 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 ?Significant Hospital Events:   ?4/05 Admit, start abx and diuresis, cardiology consult, transfer to [facility] ? ?Studies:  ?Serology 01/29/18 >> ANA positive 1:80, nuclear homogenous pattern ?PFT 03/15/21 >> FVC 2.15 (50%), FEV1 1.44 (46%), FEV1% 67, DLCO 38% ?Echo 03/27/21 >> EF 60 to 65%, grade 1 DD, posterior pericardial effusion, aortic root 39 mm ?HRCT chest 05/29/21 >> small pericardial effusion, 3 vessel CAD, subpleural predominant reticular opacities with basilar predominance, traction bronchiectasis, honeycombing, new patchy GGO Rt > Lt, small Rt effusion ? ?Interim History / Subjective:  ?He is anxious to get heart cath done.  Not having cough, wheeze, or sputum.  Chest pain better.  Not having leg swelling. ? ?Objective   ?Blood pressure (!) 151/96, pulse 60, temperature (!) 97.4 ?F (36.3 ?C), temperature source Oral, resp. rate 16, height '5\' 9"'$  (1.753 m), weight 76.5 kg, SpO2 97 %. ?   ?   ? ?Intake/Output Summary (Last 24 hours) at 05/30/2021 1045 ?Last data filed  at 05/30/2021 1000 ?Gross per 24 hour  ?Intake 802.57 ml  ?Output 1200 ml  ?Net -397.43 ml  ? ?Filed Weights  ? 05/29/21 1945 05/29/21 2017 05/30/21 0500  ?Weight: 77.9 kg 75.8 kg 76.5 kg  ? ? ?Examination: ? ?General - alert ?Eyes - pupils reactive ?ENT - no sinus tenderness, no stridor ?Cardiac - regular rate/rhythm, no murmur ?Chest - b/l crackles ?Abdomen - soft, non tender, + bowel sounds ?Extremities - no cyanosis, clubbing, or edema ?Skin - no rashes ?Neuro - normal strength, moves extremities, follows commands ?Psych - normal mood and behavior ? ?Discussion:  ?Suspect his acute deterioration is cardiac in origin with acute pulmonary edema in setting of IPF.  Don't think he has acute flare of IPF, and also seems less likely that he has bacterial pneumonia causing acute worsening of symptoms and O2 needs.  COVD/Flu and RVP panels negative. ? ?Assessment & Plan:  ? ?Acute hypoxic respiratory failure most likely from acute pulmonary edema. ?- negative fluid balance as tolerated ?- goal SpO2 > 92% ?- will d/c antibiotics and solumedrol ?- d/c nebulizer treatments ? ?NSTEMI. ?Hx of CAD, PAF, HLD. ?- for heart catheterization 4/06 ? ?IPF/UIP. ?- followed by Dr. Chase Caller in pulmonary office ?- initial diagnosis in December 2018 ?- tried on nintedanib in 2020, but d/c'ed due to weight loss ?- tried on pirfenidone in June 2021, but d/c'ed due to weight loss ?- restarted nintedanib in December 2021; continue this ? ?Goals of care. ?- DNR/DNI ? ?Labs   ? ? ?  Latest Ref Rng &  Units 05/30/2021  ? 12:48 AM 05/29/2021  ? 10:45 AM 03/15/2021  ?  2:39 PM  ?CMP  ?Glucose 70 - 99 mg/dL 134   92   78    ?BUN 8 - 23 mg/dL '20   22   11    '$ ?Creatinine 0.61 - 1.24 mg/dL 1.53   1.57   1.08    ?Sodium 135 - 145 mmol/L 136   137   139    ?Potassium 3.5 - 5.1 mmol/L 4.6   4.6   4.0    ?Chloride 98 - 111 mmol/L 101   102   101    ?CO2 22 - 32 mmol/L 25   29   34    ?Calcium 8.9 - 10.3 mg/dL 8.7   9.1   8.9    ?Total Protein 6.5 - 8.1 g/dL 6.7    6.5   6.8    ?Total Bilirubin 0.3 - 1.2 mg/dL 1.0   1.1   0.7    ?Alkaline Phos 38 - 126 U/L 68   66   81    ?AST 15 - 41 U/L 37   34   34    ?ALT 0 - 44 U/L '27   20   19    '$ ? ? ? ?  Latest Ref Rng & Units 05/30/2021  ? 12:48 AM 05/29/2021  ? 10:45 AM 03/15/2021  ?  2:39 PM  ?CBC  ?WBC 4.0 - 10.5 K/uL 9.3   7.5   7.2    ?Hemoglobin 13.0 - 17.0 g/dL 13.5   13.0   14.3    ?Hematocrit 39.0 - 52.0 % 39.7   38.2   43.6    ?Platelets 150 - 400 K/uL 254   224.0   244.0    ? ? ?ProBNP (last 3 results) ?Recent Labs  ?  05/29/21 ?1045  ?PROBNP 926.0*  ? ? ?Signature:  ?Chesley Mires, MD ?[place] ?Pager - [phone] ?05/30/2021, 11:10 AM ? ? ? ? ?

## 2021-05-31 DIAGNOSIS — I482 Chronic atrial fibrillation, unspecified: Secondary | ICD-10-CM

## 2021-05-31 DIAGNOSIS — I5032 Chronic diastolic (congestive) heart failure: Secondary | ICD-10-CM

## 2021-05-31 LAB — CBC WITH DIFFERENTIAL/PLATELET
Abs Immature Granulocytes: 0.06 10*3/uL (ref 0.00–0.07)
Basophils Absolute: 0 10*3/uL (ref 0.0–0.1)
Basophils Relative: 0 %
Eosinophils Absolute: 0 10*3/uL (ref 0.0–0.5)
Eosinophils Relative: 0 %
HCT: 37.7 % — ABNORMAL LOW (ref 39.0–52.0)
Hemoglobin: 12.6 g/dL — ABNORMAL LOW (ref 13.0–17.0)
Immature Granulocytes: 1 %
Lymphocytes Relative: 4 %
Lymphs Abs: 0.5 10*3/uL — ABNORMAL LOW (ref 0.7–4.0)
MCH: 32.6 pg (ref 26.0–34.0)
MCHC: 33.4 g/dL (ref 30.0–36.0)
MCV: 97.4 fL (ref 80.0–100.0)
Monocytes Absolute: 1.1 10*3/uL — ABNORMAL HIGH (ref 0.1–1.0)
Monocytes Relative: 9 %
Neutro Abs: 10.5 10*3/uL — ABNORMAL HIGH (ref 1.7–7.7)
Neutrophils Relative %: 86 %
Platelets: 263 10*3/uL (ref 150–400)
RBC: 3.87 MIL/uL — ABNORMAL LOW (ref 4.22–5.81)
RDW: 14.8 % (ref 11.5–15.5)
WBC: 12.3 10*3/uL — ABNORMAL HIGH (ref 4.0–10.5)
nRBC: 0 % (ref 0.0–0.2)

## 2021-05-31 LAB — COMPREHENSIVE METABOLIC PANEL
ALT: 24 U/L (ref 0–44)
AST: 32 U/L (ref 15–41)
Albumin: 3 g/dL — ABNORMAL LOW (ref 3.5–5.0)
Alkaline Phosphatase: 63 U/L (ref 38–126)
Anion gap: 7 (ref 5–15)
BUN: 25 mg/dL — ABNORMAL HIGH (ref 8–23)
CO2: 30 mmol/L (ref 22–32)
Calcium: 8.8 mg/dL — ABNORMAL LOW (ref 8.9–10.3)
Chloride: 101 mmol/L (ref 98–111)
Creatinine, Ser: 1.21 mg/dL (ref 0.61–1.24)
GFR, Estimated: >60 mL/min (ref >60)
Glucose, Bld: 93 mg/dL (ref 70–99)
Potassium: 4.7 mmol/L (ref 3.5–5.1)
Sodium: 138 mmol/L (ref 135–145)
Total Bilirubin: 0.6 mg/dL (ref 0.3–1.2)
Total Protein: 6.2 g/dL — ABNORMAL LOW (ref 6.5–8.1)

## 2021-05-31 LAB — APTT
aPTT: 38 seconds — ABNORMAL HIGH (ref 24–36)
aPTT: 50 seconds — ABNORMAL HIGH (ref 24–36)

## 2021-05-31 LAB — T4, FREE: Free T4: 1.02 ng/dL (ref 0.61–1.12)

## 2021-05-31 LAB — HEPARIN LEVEL (UNFRACTIONATED): Heparin Unfractionated: >1.1 IU/mL — ABNORMAL HIGH (ref 0.30–0.70)

## 2021-05-31 LAB — PHOSPHORUS: Phosphorus: 3.7 mg/dL (ref 2.5–4.6)

## 2021-05-31 LAB — MAGNESIUM: Magnesium: 2 mg/dL (ref 1.7–2.4)

## 2021-05-31 MED ORDER — APIXABAN 5 MG PO TABS
5.0000 mg | ORAL_TABLET | Freq: Two times a day (BID) | ORAL | Status: DC
  Administered 2021-05-31 – 2021-06-01 (×2): 5 mg via ORAL
  Filled 2021-05-31 (×2): qty 1

## 2021-05-31 NOTE — Progress Notes (Signed)
?PROGRESS NOTE ? ? ? ?Eduardo Fuentes  ZOX:[id number] DOB: Mar 05, 1950 DOA: 05/29/2021 ?PCP: Lujean Amel, MD  ? ?Brief Narrative:  ?HPI per Dr. Alben Deeds ?HPI: Eduardo Fuentes is a 71 y.o. 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 ?  ?He notes for past 2-3 weeks, feels like switch was flipped.  He's been more SOB with exertion.  Prior to 3 weeks ago, was able to line his driveway with bricks without issue.  He used to pick up his dog after he took her out.  In the past 2-3 weeks, he's unable to pick up his dog anymore due to the SOB.  He's noticing much more SOB with typical tasks. Denies orthopnea, notes PND, denies edema.  Notes occasional dull CP, improves after a few minutes.  Sometimes improves after rubbing it.  L sided.  No clear exacerbating features.  Denies smoking or etoh use. ?  ?Clinic and ED Course: Seen in pulmonary clinic by Geraldo Pitter, noted to be hypoxic, discharge on 6-8 L with activity, labs collected (troponin, BNP, CBC, BMP, HFP, troponin, TSH, D dimer).  Troponin and BNP were elevated.  EDP discussed with cardiology who recommended medical admission to [facility]. ? ?**Interim History ?He was transferred to [facility] and evaluated by cardiology and pulmonary.  Given cardiology evaluation they felt the patient would benefit from a left and right heart cath given that it is unclear if patient had demand from hypoxia or plaque rupture event given that his peak troponin and his T wave inversion ectopy.  Right heart cath was indicated to assess his volume status.  Cardiology recommended continuing statin and Lasix for now and he was continued on heparin drip.  Patient had acute hypoxic respiratory failure and the pulmonology team felt it is most likely from acute pulmonary  edema and recommended continuing a negative fluid balance and discontinued antibiotics and Solu-Medrol.  He follows with Dr. Chase Caller for his IPF/UIP.  ? ?Cath was done and he had no evidence of obstructive CAD and he had a right atrial pressure of 5 mm per mercury.  Cardiology recommended transitioning to Lasix 20 mg p.o. daily and continue diltiazem.  We are going to resume his Eliquis now given that pulmonary is no further procedures planned and we will obtain PT OT evaluate and do an ambulatory home O2 screen.  Cardiology has signed off the case and recommending maintenance diuretics.  Pulmonary evaluated and recommending continuing his OFEV and holding off on resuming steroids and antibiotics at this time.  ? ? ?Assessment and Plan: ?* Shortness of breath ?-See below ? ?Acute respiratory failure with hypoxia ([place]) ?-Hx IPF on OFEV which has now been resumed ?-Not on home O2 ?-Today, required 6-8 L to maintain sats >90% ?-SpO2: 92 % ?O2 Flow Rate (L/min): 4 L/min ?FiO2 (%): (!) 0 % (N/A) ?-CXR with cardiomegaly, possibly superimposed interstitial pneumonia, possible small R effusion ?-Negative D dimer ?-Discussed with Dr. Chase Caller, recommended HRCT, steroids, inpatient pulm c/s ?-Will trial diuresis with 40 mg lasix x1, follow response ?-Now pulmonary thinks it is related to acute pulmonary edema.  Patient is getting a right and left heart cath today but showed no volume overload ?-The patient has an acute exacerbation of ILD ?-His Lasix was changed to p.o. and we have stopped his  steroids and antibiotics ?-continue supplemental oxygen via nasal cannula and wean O2 as tolerated ?-Continuous pulse oximetry maintain O2 saturations greater than 92% ?-We will need ambulatory home O2 screen prior to discharge and repeat chest x-ray in a.m. ?-PT OT to further evaluate and treat ? ?IPF (idiopathic pulmonary fibrosis) ([place]) ?-Follows with Dr. Chase Caller in the pulmonary office and was initially diagnosed back in  December 2018 and he was tried on multiple medications but they were discontinued due to his weight loss but then he was restarted on OFEV ?-Has an ILD Flare and Pulmonary recommending Supportive Care ? ?Elevated troponin ?NSTEMI due to demand from Hypoxia ?-Initial 216 -> 187 -> 112 ?-Described intermittent L sided CP lasting minutes starting 2-3 weeks ago - no clear exacerbating features.   ?-EKG showed T wave inversions in aVF, V2-V6 which appear new ?-[facility]  -Initially held Eliquis and started on heparin drip but will go back on Eliquis now ?-Patient underwent a right and left heart cath which showed:  ? ?The left ventricular systolic function is normal. ?  LV end diastolic pressure is normal. ?  The left ventricular ejection fraction is 50-55% by visual estimate. ?  Hemodynamic findings consistent with mild pulmonary hypertension. ?  There is no aortic valve stenosis. ?  Mild, diffuse nonobstructive coronary artery disease. ?  Aortic saturation 94%, PA saturation 65%, PA pressure 55/21, mean PA pressure 34 mmHg, pulmonary capillary wedge pressure 9 mmHg, cardiac output 4.75 L/min, cardiac index 2.5. ?  ?Elevated troponin likely from demand ischemia.  Nonobstructive, minimal CAD.  Continue medical therapy. ?-Cardiology has initiated the patient on p.o. Lasix 20 mg daily and have signed off the case given that he is improved ? ? ? ?AKI (acute kidney injury) ([place]) ?-Follow with diuresis ?-Patient's BUNs/creatinine improved and went from 20/1.53 -> 22/1.17 -> 25/1.21 ?-Avoid further nephrotoxic medications, contrast dyes, hypotension and renally adjust medications ?-Repeat CMP in a.m. ? ?(HFpEF) heart failure with preserved ejection fraction ([place]) ?-Elevated BNP of 926 ?-Not grossly overloaded ?-Trial lasix 40 x1 and follow continue with diuresis per cardiology recommendations and now on p.o. 20 mg Lasix daily ?-Strict I&O, daily weights; patient is -1.981 L since  admission ?-Right and left heart cath done and delineated as in the note ? ?Atrial fibrillation, chronic ([place]) ?-Continue diltiazem ?-Resume Eliquis now ?-Cardiology was following and right and left heart cath results as delineated ? ?Generalized anxiety disorder ?-Continue Klonopin ? ?Dyslipidemia ?-C/w Statin ? ?DVT prophylaxis:  ?apixaban (ELIQUIS) tablet 5 mg  ?  Code Status: DNR ?Family Communication:  ? ?Disposition Plan:  ?Level of care: Progressive ?Status is: Inpatient ?Remains inpatient appropriate because: Needs an ambulatory home O2 screen improvement of his respiratory status prior to safe discharge disposition as well as PT OT evaluation ?  ?Consultants:  ?Cardiology ?Pulmonology ? ?Procedures:  ?High-resolution CT scan and a right and left heart catheterization ? ?Antimicrobials:  ?Anti-infectives (From admission, onward)  ? ? Start     Dose/Rate Route Frequency Ordered Stop  ? 05/29/21 1830  cefTRIAXone (ROCEPHIN) 2 g in sodium chloride 0.9 % 100 mL IVPB  Status:  Discontinued       ? 2 g ?200 mL/hr over 30 Minutes Intravenous Every 24 hours 05/29/21 1815 05/30/21 1109  ? 05/29/21 1830  azithromycin (ZITHROMAX) tablet 500 mg  Status:  Discontinued       ? 500 mg Oral Daily 05/29/21 1815 05/30/21 1109  ? ?  ?  ?Subjective: ?Seen and  examined at bedside and he is doing okay.  Remains on 4 L supplemental oxygen nasal cannula.  States that he had a good meal last night.  Denies any chest pain or shortness breath currently.  No other concerns or complaints at this time. ? ?Objective: ?Vitals:  ? 05/31/21 0605 05/31/21 7341 05/31/21 0816 05/31/21 1557  ?BP:   128/81 136/74  ?Pulse:  75  71  ?Resp:  16 15 (!) 21  ?Temp:      ?TempSrc:      ?SpO2: (!) 85% 90% 95% 92%  ?Weight: 75.3 kg     ?Height:      ? ? ?Intake/Output Summary (Last 24 hours) at 05/31/2021 1703 ?Last data filed at 05/31/2021 1556 ?Gross per 24 hour  ?Intake 1114.1 ml  ?Output 3800 ml  ?Net -2685.9 ml  ? ?Filed Weights  ? 05/29/21 2017 05/30/21  0500 05/31/21 0605  ?Weight: 75.8 kg 76.5 kg 75.3 kg  ? ?Examination: ?Physical Exam: ? ?Constitutional: WN/WD Caucasian male currently no acute distress ?Respiratory: Diminished to auscultation bilaterally with coarse brea

## 2021-05-31 NOTE — Progress Notes (Signed)
ANTICOAGULATION CONSULT NOTE  ? ?Pharmacy Consult for Heparin while Eliquis on hold ?Indication: atrial fibrillation, elevated troponin ? ?Allergies  ?Allergen Reactions  ? Aleve [Naproxen Sodium] Anaphylaxis, Nausea Only and Other (See Comments)  ?  GI UPSET- Patient does not remember "anaphylaxis" in 2023  ? Demerol [Meperidine] Rash  ?   ?  ? Hyzaar [Losartan Potassium-Hctz] Other (See Comments)  ?  "Felt badly" ?  ? Lisinopril Cough  ? ? ?Patient Measurements: ?Height: '5\' 9"'$  (175.3 cm) ?Weight: 75.3 kg (166 lb 0.1 oz) ?IBW/kg (Calculated) : 70.7 ?Heparin Dosing Weight: 77.9 kg ? ?Vital Signs: ?Temp: 97.7 ?F (36.5 ?C) (04/07 0251) ?Temp Source: Oral (04/07 0251) ?BP: 128/81 (04/07 0816) ?Pulse Rate: 75 (04/07 8144) ? ?Labs: ?Recent Labs  ?  05/29/21 ?1045 05/29/21 ?1045 05/29/21 ?1432 05/29/21 ?1725 05/29/21 ?1831 05/30/21 ?8185 05/30/21 ?6314 05/30/21 ?0406 05/30/21 ?9702 05/30/21 ?1212 05/30/21 ?1528 05/30/21 ?1536 05/30/21 ?1537 05/31/21 ?0028 05/31/21 ?6378 05/31/21 ?5885  ?HGB 13.0  --   --   --   --  13.5  --   --   --   --    < > 12.6* 12.6* 12.6*  --   --   ?HCT 38.2*  --   --   --   --  39.7  --   --   --   --    < > 37.0* 37.0* 37.7*  --   --   ?PLT 224.0  --   --   --   --  254  --   --   --   --   --   --   --  263  --   --   ?APTT  --    < >  --   --  38*  --  163*   < > 113* 96*  --   --   --  38*  --  50*  ?LABPROT  --   --   --   --  21.2*  --   --   --   --  18.0*  --   --   --   --   --   --   ?INR  --   --   --   --  1.9*  --   --   --   --  1.5*  --   --   --   --   --   --   ?HEPARINUNFRC  --   --   --   --  >1.10*  --  >1.10*  --   --   --   --   --   --   --  >1.10*  --   ?CREATININE 1.57*  --   --   --   --  1.53*  --   --   --  1.17  --   --   --  1.21  --   --   ?TROPONINIHS  --   --  187* 198*  --   --   --   --  112*  --   --   --   --   --   --   --   ? < > = values in this interval not displayed.  ? ? ? ?Estimated Creatinine Clearance: 56 mL/min (by C-G formula based on SCr of 1.21  mg/dL). ? ? ?Medical History: ? ? ?Medications:  ?Scheduled:  ? ?Infusions:  ? sodium chloride    ?  heparin 1,000 Units/hr (05/30/21 2345)  ? ?PRN:  ? ?Assessment: ?71 yo male with hx PAF on chronic Eliquis presents with shortness of breath and new O2 requirements.  Pharmacy is consulted to dose IV heparin for NSTEMI. ? ?Last dose of Eliquis today (4/5) at 07:30.   ? ?APTT this AM below goal range at 50 seconds.  Heparin level sill falsely elevated from recent apixaban. ? ? ?Goal of Therapy:  ?Heparin level 0.3-0.7 units/ml ?PTT 66-102 seconds ?Monitor platelets by anticoagulation protocol: Yes ?  ?Plan:  ?Increase IV heparin to 1150 units/hr. ?Recheck aPTT in 8 hrs. ?If no further plans for procedures, could Eliquis be resumed? ? ?N[place]ada Crane, Pharm D, BCPS, BCCP ?Clinical Pharmacist ? 05/31/2021 8:57 AM  ? ?[facility] pharmacy phone numbers are listed on [email or web address] ? ? ? ?

## 2021-05-31 NOTE — Progress Notes (Signed)
? ?Progress Note ? ?Patient Name: Eduardo Fuentes ?Date of Encounter: 05/31/2021 ? ?Primary Cardiologist: Glenetta Hew, MD  ? ?Subjective  ? ?RA pressure of 5 mm Hg, no obstructive CAD. ?Patient feels better. ?He notes that he wants someone to check his thyroid. ?He is hoping to see ILD specialist ? ?Inpatient Medications  ?  ?Scheduled Meds: ? Chlorhexidine Gluconate Cloth  6 each Topical Daily  ? clonazePAM  0.5 mg Oral Q1400  ? clonazePAM  1 mg Oral 2 times per day  ? diltiazem  120 mg Oral q AM  ? furosemide  40 mg Intravenous BID  ? gabapentin  600 mg Oral BID  ? mouth rinse  15 mL Mouth Rinse BID  ? mirtazapine  30 mg Oral QHS  ? Nintedanib  150 mg Oral BID  ? pantoprazole  40 mg Oral QHS  ? rosuvastatin  20 mg Oral Daily  ? sodium chloride flush  3 mL Intravenous Q12H  ? sodium chloride flush  3 mL Intravenous Q12H  ? ?Continuous Infusions: ? sodium chloride    ? heparin 1,000 Units/hr (05/30/21 2345)  ? ?PRN Meds: ?sodium chloride, acetaminophen **OR** acetaminophen, dimenhyDRINATE, ondansetron (ZOFRAN) IV, sodium chloride flush  ? ?Vital Signs  ?  ?Vitals:  ? 05/31/21 0600 05/31/21 0605 05/31/21 0611 05/31/21 0816  ?BP:    128/81  ?Pulse:   75   ?Resp:   16 15  ?Temp:      ?TempSrc:      ?SpO2: 90% (!) 85% 90% 95%  ?Weight:  75.3 kg    ?Height:      ? ? ?Intake/Output Summary (Last 24 hours) at 05/31/2021 0839 ?Last data filed at 05/31/2021 0600 ?Gross per 24 hour  ?Intake 1422.24 ml  ?Output 1850 ml  ?Net -427.76 ml  ? ?Filed Weights  ? 05/29/21 2017 05/30/21 0500 05/31/21 0605  ?Weight: 75.8 kg 76.5 kg 75.3 kg  ? ? ?Telemetry  ?  ?SR with PVCs - Personally Reviewed ? ?Physical Exam  ? ?Gen: No distress   ?Neck: No JVD ?Ears: Bilateral Pilar Plate Sign ?Cardiac: No Rubs or Gallops, no Murmur, RRR +2 R  radial pulses no bruits or R arm hematoma ?Respiratory: Coarse breath sounds bilaterally with normal effort and no tachypnea ?GI: Soft, nontender, non-distended  ?MS: No  edema;  moves all extremities ?Integument:  Skin feels warm ?Neuro:  At time of evaluation, alert and oriented to person/place/time/situation  ?Psych: Normal affect, patient feels well ? ? ?Labs  ?  ?Chemistry ?Recent Labs  ?Lab 05/29/21 ?1045 05/30/21 ?9449 05/30/21 ?1212 05/30/21 ?1528 05/30/21 ?1536 05/30/21 ?1537 05/31/21 ?0028  ?NA 137 136 137   < > 140 139 138  ?K 4.6 4.6 4.4   < > 4.2 4.3 4.7  ?CL 102 101 102  --   --   --  101  ?CO2 '29 25 28  '$ --   --   --  30  ?GLUCOSE 92 134* 127*  --   --   --  93  ?BUN '22 20 22  '$ --   --   --  25*  ?CREATININE 1.57* 1.53* 1.17  --   --   --  1.21  ?CALCIUM 9.1 8.7* 9.0  --   --   --  8.8*  ?PROT 6.5 6.7  --   --   --   --  6.2*  ?ALBUMIN 3.7 3.2*  --   --   --   --  3.0*  ?AST  34 37  --   --   --   --  32  ?ALT 20 27  --   --   --   --  24  ?ALKPHOS 66 68  --   --   --   --  63  ?BILITOT 1.1 1.0  --   --   --   --  0.6  ?GFRNONAA  --  48* >60  --   --   --  >60  ?ANIONGAP  --  10 7  --   --   --  7  ? < > = values in this interval not displayed.  ?  ? ?Hematology ?Recent Labs  ?Lab 05/29/21 ?1045 05/30/21 ?6812 05/30/21 ?1528 05/30/21 ?1536 05/30/21 ?1537 05/31/21 ?0028  ?WBC 7.5 9.3  --   --   --  12.3*  ?RBC 3.91* 4.07*  --   --   --  3.87*  ?HGB 13.0 13.5   < > 12.6* 12.6* 12.6*  ?HCT 38.2* 39.7   < > 37.0* 37.0* 37.7*  ?MCV 97.5 97.5  --   --   --  97.4  ?MCH  --  33.2  --   --   --  32.6  ?MCHC 34.2 34.0  --   --   --  33.4  ?RDW 16.1* 15.2  --   --   --  14.8  ?PLT 224.0 254  --   --   --  263  ? < > = values in this interval not displayed.  ? ? ?Cardiac EnzymesNo results for input(s): TROPONINI in the last 168 hours. No results for input(s): TROPIPOC in the last 168 hours.  ? ?BNP ?Recent Labs  ?Lab 05/29/21 ?1045  ?PROBNP 926.0*  ?  ? ?DDimer  ?Recent Labs  ?Lab 05/29/21 ?1123  ?DDIMER 0.39  ?  ? ?Radiology  ?  ?DG Chest 2 View ? ?Result Date: 05/29/2021 ?CLINICAL DATA:  Hypoxia EXAM: CHEST - 2 VIEW COMPARISON:  05/03/2020 FINDINGS: Transverse diameter of heart is increased. Central pulmonary vessels are more  prominent. Increased markings are seen in the right upper and both lower lung fields. Right lateral CP angle is indistinct. There is no pneumothorax. IMPRESSION: Cardiomegaly. Central pulmonary vessels are more prominent without signs of alveolar pulmonary edema. Increased markings are seen in the right upper and both lower lung fields suggesting underlying scarring and possibly superimposed interstitial pneumonia. Possible small right pleural effusion. Electronically Signed   By: Elmer Picker M.D.   On: 05/29/2021 15:11  ? ?CARDIAC CATHETERIZATION ? ?Result Date: 05/30/2021 ?  The left ventricular systolic function is normal.   LV end diastolic pressure is normal.   The left ventricular ejection fraction is 50-55% by visual estimate.   Hemodynamic findings consistent with mild pulmonary hypertension.   There is no aortic valve stenosis.   Mild, diffuse nonobstructive coronary artery disease.   Aortic saturation 94%, PA saturation 65%, PA pressure 55/21, mean PA pressure 34 mmHg, pulmonary capillary wedge pressure 9 mmHg, cardiac output 4.75 L/min, cardiac index 2.5. Elevated troponin likely from demand ischemia.  Nonobstructive, minimal CAD.  Continue medical therapy. Can restart heparin 6 hours after sheath pull.  Resume Eliquis tomorrow if no other invasive procedures planned.  ? ?CT Chest High Resolution ? ?Result Date: 05/30/2021 ?CLINICAL DATA:  Shortness of breath EXAM: CT CHEST WITHOUT CONTRAST TECHNIQUE: Multidetector CT imaging of the chest was performed following the standard protocol without intravenous contrast. High resolution imaging of the  lungs, as well as inspiratory and expiratory imaging, was performed. RADIATION DOSE REDUCTION: This exam was performed according to the departmental dose-optimization program which includes automated exposure control, adjustment of the mA and/or kV according to patient size and/or use of iterative reconstruction technique. COMPARISON:  Chest CT dated June 21, 2020 FINDINGS: Cardiovascular: Normal heart size. Small pericardial effusion. Three-vessel coronary artery calcifications. Atherosclerotic disease of the thoracic aorta. Mediastinum/Nodes: Esophagus and thyroid are unremarkable. Enlarged hilar and mediastinal lymph nodes are increased in size when compared to prior exam. Reference AP window lymph node measures 1.8 cm in short axis on series 4, image 46, previously 1.3 cm. Lungs/Pleura: Central airways are patent. Subpleural predominant reticular opacities with a slight basilar predominance, traction bronchiectasis and honeycomb change. Compared with prior exam there are new patchy bilateral ground-glass opacities, right greater than left. New trace right pleural effusion. Upper Abdomen: No acute abnormality. Musculoskeletal: No chest wall mass or suspicious bone lesions identified. IMPRESSION: 1. UIP pattern fibrosis with new bilateral patchy ground-glass opacities, differential considerations include infection, pulmonary edema, or acute exacerbation of ILD. Favor pulmonary edema given presence of new trace right pleural effusion and new small pericardial effusion. 2. Enlarged hilar and mediastinal lymph nodes, increased in size when compared to prior exam, favored to be reactive. Recommend follow-up chest CT in 3 months to ensure resolution. 3.  Aortic Atherosclerosis (ICD10-I70.0). Electronically Signed   By: Yetta Glassman M.D.   On: 05/30/2021 08:47   ? ? ?Patient Profile  ?   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 ?Assessment & Plan  ?  ?NSTEMI due to demand from hypoxia ?IPF on OFEV ?CAC- LDL 45 ?- transition to lasix 20 mg PO daily ?- continue diltiazem ?- can return to eliquis unless other procedures are planned ?- continue rosuvastatin ?- we have discussed his long term progression of the disease at length ?- patient has concerns that thyroid disease may be exacerbating his symptoms; discussed this with [facility] per his  request  TSH on 05/29/21 was WNL at 0.71 ? ?[facility] will sign off.   ?Medication Recommendations:  eliquis, diltiazem and maintenance diuretics ?Other recommendations (labs, testing, etc):  NA ?[place]

## 2021-05-31 NOTE — TOC Progression Note (Signed)
Transition of Care (TOC) - Progression Note  ? ? ?Patient Details  ?Name: ALEXIZ SUSTAITA ?MRN: [id number] ?Date of Birth: [birth year] ? ?Transition of Care (TOC) CM/SW Contact  ?Angelita Ingles, RN ?Phone Number:[phone] ? ?05/31/2021, 1:03 PM ? ?Clinical Narrative:    ? ?Transition of Care (TOC) Screening Note ? ? ?Patient Details  ?Name: BRANTLY KALMAN ?Date of Birth: May 28, 1950 ? ? ?Transition of Care (TOC) CM/SW Contact:    ?Angelita Ingles, RN ?Phone Number: ?05/31/2021, 1:03 PM ? ? ? ?Transition of Care Department [facility]) has reviewed patient and no TOC needs have been identified at this time. We will continue to monitor patient advancement through interdisciplinary progression rounds.  ? ? ? ? ?  ?  ? ?Expected Discharge Plan and Services ?  ?  ?  ?  ?  ?                ?  ?  ?  ?  ?  ?  ?  ?  ?  ?  ? ? ?Social Determinants of Health (SDOH) Interventions ?  ? ?Readmission Risk Interventions ?   ? View : No data to display.  ?  ?  ?  ? ? ?

## 2021-05-31 NOTE — Progress Notes (Signed)
ANTICOAGULATION CONSULT NOTE  ? ?Pharmacy Consult for Heparin while Eliquis on hold ?Indication: atrial fibrillation, elevated troponin ? ?Allergies  ?Allergen Reactions  ? Aleve [Naproxen Sodium] Anaphylaxis, Nausea Only and Other (See Comments)  ?  GI UPSET- Patient does not remember "anaphylaxis" in 2023  ? Demerol [Meperidine] Rash  ?   ?  ? Hyzaar [Losartan Potassium-Hctz] Other (See Comments)  ?  "Felt badly" ?  ? Lisinopril Cough  ? ? ?Patient Measurements: ?Height: '5\' 9"'$  (175.3 cm) ?Weight: 75.3 kg (166 lb 0.1 oz) ?IBW/kg (Calculated) : 70.7 ?Heparin Dosing Weight: 77.9 kg ? ?Vital Signs: ?BP: 136/74 (04/07 1557) ?Pulse Rate: 71 (04/07 1557) ? ?Labs: ?Recent Labs  ?  05/29/21 ?1045 05/29/21 ?1045 05/29/21 ?1432 05/29/21 ?1725 05/29/21 ?1831 05/30/21 ?3419 05/30/21 ?3790 05/30/21 ?0406 05/30/21 ?2409 05/30/21 ?1212 05/30/21 ?1528 05/30/21 ?1536 05/30/21 ?1537 05/31/21 ?0028 05/31/21 ?7353 05/31/21 ?2992  ?HGB 13.0  --   --   --   --  13.5  --   --   --   --    < > 12.6* 12.6* 12.6*  --   --   ?HCT 38.2*  --   --   --   --  39.7  --   --   --   --    < > 37.0* 37.0* 37.7*  --   --   ?PLT 224.0  --   --   --   --  254  --   --   --   --   --   --   --  263  --   --   ?APTT  --    < >  --   --  38*  --  163*   < > 113* 96*  --   --   --  38*  --  50*  ?LABPROT  --   --   --   --  21.2*  --   --   --   --  18.0*  --   --   --   --   --   --   ?INR  --   --   --   --  1.9*  --   --   --   --  1.5*  --   --   --   --   --   --   ?HEPARINUNFRC  --   --   --   --  >1.10*  --  >1.10*  --   --   --   --   --   --   --  >1.10*  --   ?CREATININE 1.57*  --   --   --   --  1.53*  --   --   --  1.17  --   --   --  1.21  --   --   ?TROPONINIHS  --   --  187* 198*  --   --   --   --  112*  --   --   --   --   --   --   --   ? < > = values in this interval not displayed.  ? ? ? ?Estimated Creatinine Clearance: 56 mL/min (by C-G formula based on SCr of 1.21 mg/dL). ? ? ?Medical History: ? ? ?Medications:  ?Scheduled:   ? ?Infusions:  ? sodium chloride    ? heparin 1,150 Units/hr (05/31/21 0930)  ? ?PRN:  ? ?  Assessment: ?71 yo male with hx PAF on chronic Eliquis presents with shortness of breath and new O2 requirements.  Pharmacy is consulted to dose IV heparin for NSTEMI. ? ?Pt now to transition back to apixaban.  ? ? ?Goal of Therapy:  ?Heparin level 0.3-0.7 units/ml ?PTT 66-102 seconds ?Monitor platelets by anticoagulation protocol: Yes ?  ?Plan:  ?Stop heparin ?Resume apixaban '5mg'$  BID ? ?Arrie Senate, PharmD, BCPS, BCCP ?Clinical Pharmacist ?[phone] ?Please check AMION for all [place] numbers ?05/31/2021 ? ? ? ? ?

## 2021-05-31 NOTE — Progress Notes (Signed)
Patient wife brought in home medication Ofev (Nintedanib) as requested by pharmacy. RN went into room to take meds to pharmacy and patient told RN he had already taken one around 1200. RN notified MD Alfredia Ferguson and took medications to pharmacy. Will continue to monitor.  ?

## 2021-05-31 NOTE — Progress Notes (Signed)
? ?NAME:  Eduardo Fuentes, MRN:  [id number], DOB:  1950-11-18, LOS: 2 ?ADMISSION DATE:  05/29/2021, CONSULTATION DATE:  05/29/2021 ?REFERRING MD:  Dr. Florene Glen, [facility], CHIEF COMPLAINT:  Short of breath  ? ?History of Present Illness:  ?71 yo male with hx of IPF/UIP on nintedanib developed sudden worsening of dyspnea about 2 weeks prior to admission.  This started after he unloaded bricks for a home project he was working on.  He also had chest pressure.  He did not need supplemental oxygen at home prior to admission, but noted that his SpO2 at rest was in the 70's before he was admitted.  He was seen in pulmonary office on 05/29/21.  Lab testing showed elevated troponin and he was admitted to hospital for further assessment.   ? 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 ?Significant Hospital Events:   ?4/05 Admit, start abx and diuresis, cardiology consult, transfer to [facility] ? ?Studies:  ?Serology 01/29/18 >> ANA positive 1:80, nuclear homogenous pattern ?PFT 03/15/21 >> FVC 2.15 (50%), FEV1 1.44 (46%), FEV1% 67, DLCO 38% ?Echo 03/27/21 >> EF 60 to 65%, grade 1 DD, posterior pericardial effusion, aortic root 39 mm ?HRCT chest 05/29/21 >> small pericardial effusion, 3 vessel CAD, subpleural predominant reticular opacities with basilar predominance, traction bronchiectasis, honeycombing, new patchy GGO Rt > Lt, small Rt effusion ? ?Interim History / Subjective:  ?He is anxious to get heart cath done.  Not having cough, wheeze, or sputum.  Chest pain better.  Not having leg swelling. ? ?Objective   ?Blood pressure 128/81, pulse 75, temperature 97.7 ?F (36.5 ?C), temperature source Oral, resp. rate 15, height '5\' 9"'$  (1.753 m), weight 75.3 kg, SpO2 95 %. ?   ?FiO2 (%):  [0 %] 0 %  ? ?Intake/Output Summary (Last 24 hours) at 05/31/2021 1049 ?Last  data filed at 05/31/2021 0600 ?Gross per 24 hour  ?Intake 1335.74 ml  ?Output 1850 ml  ?Net -514.26 ml  ? ?Filed Weights  ? 05/29/21 2017 05/30/21 0500 05/31/21 0605  ?Weight: 75.8 kg 76.5 kg 75.3 kg  ? ? ?Examination: ?General: Well-nourished male ?HEENT: MM pink/moist no JVD or adenopathy is appreciated ?Neuro: Grossly intact but strange affect and asked the same question and multiple forms continuously ?CV: Heart sounds are regular with atrial fibrillation ?PULM: Diminished throughout, 3 L nasal cannula O2 sats 92% ? ?GI: soft, bsx4 active  ?GU: Voids ?Extremities: warm/dry, negative edema  ?Skin: no rashes or lesions ? ?Discussion:  ?Cardiac catheterization was essentially negative.  Questionable reason for O2 desaturations questionable IPF flare versus normal progression of terminal lung disease. ? ?Assessment & Plan:  ? ?Intake/Output Summary (Last 24 hours) at 05/31/2021 1051 ?Last data filed at 05/31/2021 0600 ?Gross per 24 hour  ?Intake 1335.74 ml  ?Output 1850 ml  ?Net -514.26 ml  ?  ?Acute hypoxic respiratory failure most likely from acute pulmonary edema. ? ?Negative I&O ?Currently -500 cc per 24 hours ?Continue gentle diuresis ?Cardiac catheterization was negative ?Questionable if there is a role for steroids note steroids and antibiotics recently discontinued ? ?- negative fluid balance as tolerated ?- goal SpO2 > 92% ?- will d/c antibiotics and solumedrol ?- d/c nebulizer treatments ? ?NSTEMI. ?Hx of CAD, PAF, HLD. ?Negative heart catheter 05/30/2021 ? ?IPF/UIP. ?- followed by Dr. Chase Caller in pulmonary office ?- initial diagnosis in December 2018 ?- tried on nintedanib in 2020, but  d/c'ed due to weight loss ?- tried on pirfenidone in June 2021, but d/c'ed due to weight loss ?- restarted nintedanib in December 2021; continue this ? ?Goals of care. ?- DNR/DNI ? ?Labs   ? ? ?  Latest Ref Rng & Units 05/31/2021  ? 12:28 AM 05/30/2021  ?  3:37 PM 05/30/2021  ?  3:36 PM  ?CMP  ?Glucose 70 - 99 mg/dL 93      ?BUN 8 - 23  mg/dL 25      ?Creatinine 0.61 - 1.24 mg/dL 1.21      ?Sodium 135 - 145 mmol/L 138   139   140    ?Potassium 3.5 - 5.1 mmol/L 4.7   4.3   4.2    ?Chloride 98 - 111 mmol/L 101      ?CO2 22 - 32 mmol/L 30      ?Calcium 8.9 - 10.3 mg/dL 8.8      ?Total Protein 6.5 - 8.1 g/dL 6.2      ?Total Bilirubin 0.3 - 1.2 mg/dL 0.6      ?Alkaline Phos 38 - 126 U/L 63      ?AST 15 - 41 U/L 32      ?ALT 0 - 44 U/L 24      ? ? ? ?  Latest Ref Rng & Units 05/31/2021  ? 12:28 AM 05/30/2021  ?  3:37 PM 05/30/2021  ?  3:36 PM  ?CBC  ?WBC 4.0 - 10.5 K/uL 12.3      ?Hemoglobin 13.0 - 17.0 g/dL 12.6   12.6   12.6    ?Hematocrit 39.0 - 52.0 % 37.7   37.0   37.0    ?Platelets 150 - 400 K/uL 263      ? ? ?ProBNP (last 3 results) ?Recent Labs  ?  05/29/21 ?1045  ?PROBNP 926.0*  ? ? ?Signature:  ?Gaylyn Lambert ACNP ?Acute Care Nurse Practitioner ?[place] ?Please consult Amion ?05/31/2021, 10:49 AM ? ? ? ? ?

## 2021-06-01 ENCOUNTER — Inpatient Hospital Stay (HOSPITAL_COMMUNITY): Payer: PPO

## 2021-06-01 LAB — COMPREHENSIVE METABOLIC PANEL
ALT: 39 U/L (ref 0–44)
AST: 53 U/L — ABNORMAL HIGH (ref 15–41)
Albumin: 3.1 g/dL — ABNORMAL LOW (ref 3.5–5.0)
Alkaline Phosphatase: 66 U/L (ref 38–126)
Anion gap: 9 (ref 5–15)
BUN: 25 mg/dL — ABNORMAL HIGH (ref 8–23)
CO2: 33 mmol/L — ABNORMAL HIGH (ref 22–32)
Calcium: 8.9 mg/dL (ref 8.9–10.3)
Chloride: 96 mmol/L — ABNORMAL LOW (ref 98–111)
Creatinine, Ser: 1.23 mg/dL (ref 0.61–1.24)
GFR, Estimated: >60 mL/min (ref >60)
Glucose, Bld: 107 mg/dL — ABNORMAL HIGH (ref 70–99)
Potassium: 3.7 mmol/L (ref 3.5–5.1)
Sodium: 138 mmol/L (ref 135–145)
Total Bilirubin: 0.8 mg/dL (ref 0.3–1.2)
Total Protein: 6.4 g/dL — ABNORMAL LOW (ref 6.5–8.1)

## 2021-06-01 LAB — CBC WITH DIFFERENTIAL/PLATELET
Abs Immature Granulocytes: 0.04 10*3/uL (ref 0.00–0.07)
Basophils Absolute: 0 10*3/uL (ref 0.0–0.1)
Basophils Relative: 0 %
Eosinophils Absolute: 0 10*3/uL (ref 0.0–0.5)
Eosinophils Relative: 0 %
HCT: 40.9 % (ref 39.0–52.0)
Hemoglobin: 13.6 g/dL (ref 13.0–17.0)
Immature Granulocytes: 0 %
Lymphocytes Relative: 14 %
Lymphs Abs: 1.6 10*3/uL (ref 0.7–4.0)
MCH: 32.5 pg (ref 26.0–34.0)
MCHC: 33.3 g/dL (ref 30.0–36.0)
MCV: 97.6 fL (ref 80.0–100.0)
Monocytes Absolute: 1.2 10*3/uL — ABNORMAL HIGH (ref 0.1–1.0)
Monocytes Relative: 10 %
Neutro Abs: 9.1 10*3/uL — ABNORMAL HIGH (ref 1.7–7.7)
Neutrophils Relative %: 76 %
Platelets: 283 10*3/uL (ref 150–400)
RBC: 4.19 MIL/uL — ABNORMAL LOW (ref 4.22–5.81)
RDW: 15.1 % (ref 11.5–15.5)
WBC: 12 10*3/uL — ABNORMAL HIGH (ref 4.0–10.5)
nRBC: 0 % (ref 0.0–0.2)

## 2021-06-01 LAB — MAGNESIUM: Magnesium: 1.9 mg/dL (ref 1.7–2.4)

## 2021-06-01 LAB — PHOSPHORUS: Phosphorus: 3.7 mg/dL (ref 2.5–4.6)

## 2021-06-01 LAB — T3: T3, Total: 80 ng/dL (ref 71–180)

## 2021-06-01 MED ORDER — FUROSEMIDE 20 MG PO TABS: 20.0000 mg | ORAL_TABLET | Freq: Every day | ORAL | 0 refills | Status: DC

## 2021-06-01 MED ORDER — FUROSEMIDE 20 MG PO TABS
20.0000 mg | ORAL_TABLET | Freq: Every day | ORAL | Status: DC
  Administered 2021-06-01: 20 mg via ORAL
  Filled 2021-06-01: qty 1

## 2021-06-01 NOTE — Discharge Summary (Signed)
Physician Discharge Summary  ?Eduardo HYSER IRS:[id number] DOB: Nov 13, 1950 DOA: 05/29/2021 ? ?PCP: Lujean Amel, MD ? ?Admit date: 05/29/2021 ?Discharge date: 06/01/2021 ? ?Admitted From: Home ?Disposition: Home with outpatient Pulmonary Rehab ? ?Recommendations for Outpatient Follow-up:  ?Follow up with PCP in 1-2 weeks ?Follow up with Pulmonary Dr. Chase Caller within 1-2 weeks ?Follow up with Cardiology within 1-2 weeks ?Please obtain CMP/CBC, Mag, Phos in one week ?Repeat CXR in 3-6 weeks ?Please follow up on the following pending results: ? ?Home Health: No  ?Equipment/Devices: DME O2    ? ?Discharge Condition: Stable  ?CODE STATUS: FULL CODE  ?Diet recommendation: Heart Healthy Diet ? ?Brief/Interim Summary: ?HPI per Dr. Alben Deeds ?HPI: Eduardo Fuentes is a 71 y.o. 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 ?  ?He notes for past 2-3 weeks, feels like switch was flipped.  He's been more SOB with exertion.  Prior to 3 weeks ago, was able to line his driveway with bricks without issue.  He used to pick up his dog after he took her out.  In the past 2-3 weeks, he's unable to pick up his dog anymore due to the SOB.  He's noticing much more SOB with typical tasks. Denies orthopnea, notes PND, denies edema.  Notes occasional dull CP, improves after a few minutes.  Sometimes improves after rubbing it.  L sided.  No clear exacerbating features.  Denies smoking or etoh use. ?  ?Clinic and ED Course: Seen in pulmonary clinic by Geraldo Pitter, noted to be hypoxic, discharge on 6-8 L with activity, labs collected (troponin, BNP, CBC, BMP, HFP, troponin, TSH, D dimer).  Troponin and BNP were elevated.  EDP discussed with cardiology who recommended medical admission to [facility]. ?  ?**Interim History ?He was  transferred to [facility] and evaluated by cardiology and pulmonary.  Given cardiology evaluation they felt the patient would benefit from a left and right heart cath given that it is unclear if patient had demand from hypoxia or plaque rupture event given that his peak troponin and his T wave inversion ectopy.  Right heart cath was indicated to assess his volume status.  Cardiology recommended continuing statin and Lasix for now and he was continued on heparin drip.  Patient had acute hypoxic respiratory failure and the pulmonology team felt it is most likely from acute pulmonary edema and recommended continuing a negative fluid balance and discontinued antibiotics and Solu-Medrol.  He follows with Dr. Chase Caller for his IPF/UIP.  ?  ?Cath was done and he had no evidence of obstructive CAD and he had a right atrial pressure of 5 mm per mercury.  Cardiology recommended transitioning to Lasix 20 mg p.o. daily and continue diltiazem.  We are going to resume his Eliquis now given that pulmonary is no further procedures planned and we will obtain PT OT evaluate and do an ambulatory home O2 screen.  Cardiology has signed off the case and recommending maintenance diuretics.  Pulmonary evaluated and recommending continuing his OFEV and holding off on resuming steroids and antibiotics at this time.  ? ?Patient steadily improved but did desaturate prior to discharge and requires 8 L of supplemental oxygen.  His home health which she confirms that he can go up to 10 L.  Case was discussed with pulmonary who felt that  he is stable from a pulm perspective to discharge and cardiology signed off yesterday.  He will need to follow-up with his PCP as well as pulmonologist and cardiologist in outpatient setting in 1 to 2 weeks as he is stable now. ? ?Discharge Diagnoses:  ?Principal Problem: ?  Shortness of breath ?Active Problems: ?  IPF (idiopathic pulmonary fibrosis) ([place]) ?  Acute respiratory failure with hypoxia ([place]) ?   Elevated troponin ?  (HFpEF) heart failure with preserved ejection fraction ([place]) ?  AKI (acute kidney injury) ([place]) ?  Atrial fibrillation, chronic ([place]) ?  Generalized anxiety disorder ?  Dyslipidemia ? ?Shortness of breath ?-See below ?  ?Acute respiratory failure with hypoxia ([place]) ?-Hx IPF on OFEV which has now been resumed ?-Not on home O2 ?-Today, required 6-8 L to maintain sats >90% and desaturated down to 88% ambulating on 8 L ?-SpO2: 93 % ?O2 Flow Rate (L/min): 5 L/min ?FiO2 (%): (!) 0 % (N/A) ?-CXR with cardiomegaly, possibly superimposed interstitial pneumonia, possible small R effusion ?-Negative D dimer ?-Discussed with Dr. Chase Caller, recommended HRCT, steroids, inpatient pulm c/s ?-Will trial diuresis with 40 mg lasix x1, follow response ?-Now pulmonary thinks it is related to acute pulmonary edema.  Patient is getting a right and left heart cath today but showed no volume overload ?-The patient has an acute exacerbation of ILD ?-His Lasix was changed to p.o. and we have stopped his steroids and antibiotics ?-continue supplemental oxygen via nasal cannula and wean O2 as tolerated ?-Continuous pulse oximetry maintain O2 saturations greater than 92% ?-PT OT to further evaluate and treat and recommending outpatient pulmonary rehab ?-Patient is stable to be discharged home at this time on supplemental oxygen ?  ?IPF (idiopathic pulmonary fibrosis) ([place]) ?-Follows with Dr. Chase Caller in the pulmonary office and was initially diagnosed back in December 2018 and he was tried on multiple medications but they were discontinued due to his weight loss but then he was restarted on OFEV ?-Has an ILD Flare and Pulmonary recommending Supportive Care and continue supplemental oxygen ?-Outpatient follow-up with Dr. Chase Caller ? ?Leukocytosis ?-Setting of steroid demargination ?-Received steroids on admission and WBC went from 9.3 and trended up to 12.3 is now 12.0 ?-No signs symptoms of infection ?  ?Elevated  troponin ?NSTEMI due to demand from Hypoxia ?-Initial 216 -> 187 -> 112 ?-Described intermittent L sided CP lasting minutes starting 2-3 weeks ago - no clear exacerbating features.   ?-EKG showed T wave inversions in aVF, V2-V6 which appear new ?-[facility]  -Initially held Eliquis and started on heparin drip but will go back on Eliquis now ?-Patient underwent a right and left heart cath which showed:  ? ?The left ventricular systolic function is normal. ?  LV end diastolic pressure is normal. ?  The left ventricular ejection fraction is 50-55% by visual estimate. ?  Hemodynamic findings consistent with mild pulmonary hypertension. ?  There is no aortic valve stenosis. ?  Mild, diffuse nonobstructive coronary artery disease. ?  Aortic saturation 94%, PA saturation 65%, PA pressure 55/21, mean PA pressure 34 mmHg, pulmonary capillary wedge pressure 9 mmHg, cardiac output 4.75 L/min, cardiac index 2.5. ?  ?Elevated troponin likely from demand ischemia.  Nonobstructive, minimal CAD.  Continue medical therapy. ?-Cardiology has initiated the patient on p.o. Lasix 20 mg daily and have signed off the case given that he is improved ?-Follow-up with cardiology outpatient setting  ?  ?  ?AKI (acute kidney injury) ([place]) ?-Follow with diuresis ?-  Patient's BUNs/creatinine improved and went from 20/1.53 -> 22/1.17 -> 25/1.21 and today was 25/1.23 ?-Avoid further nephrotoxic medications, contrast dyes, hypotension and renally adjust medications ?-Repeat CMP within 1 week ?  ?Chronic (HFpEF) diastolic heart failure with preserved ejection fraction ([place]) ?-Elevated BNP of 926 ?-Not grossly overloaded ?-Trial lasix 40 x1 and follow continue with diuresis per cardiology recommendations and now on p.o. 20 mg Lasix daily ?-Strict I&O, daily weights; patient is - -4.451 L since admission ?-Right and left heart cath done and delineated as in the note ?  ?Atrial fibrillation, chronic ([place]) ?-Continue  diltiazem ?-Resume Eliquis now ?-Cardiology was following and right and left heart cath results as delineated ?  ?Generalized anxiety disorder ?-Continue Klonopin ?  ?Dyslipidemia ?-C/w Statin ? ?Evaded AST ?-Mild and likely rea

## 2021-06-01 NOTE — Evaluation (Signed)
Physical Therapy Evaluation ?Patient Details ?Name: Eduardo Fuentes ?MRN: [id number] ?DOB: 1951-01-07 ?Today's Date: 06/01/2021 ? ?History of Present Illness ? Pt is a 71 y.o. M who presents 05/29/2021 with progressive SOB on exertion; found to have elevated troponin and BNP. CXR with cardiomegaly, possibly superimposed interstitial pneumonia, possible small R effusion. Heart cath showed no volume overload. Significant PMH: IPF, atrial fibrillation, depression/anxiety.  ?Clinical Impression ? Pt admitted with above. Prior to 2-3 weeks ago, pt reports independent with mobility/IADL's, doing yard work, and picking 12 lb dog up. Pt presents with decreased cardiopulmonary endurance and mild balance deficits. Pt ambulating 240 ft with no assistive device at a supervision level; requires 8L O2 during mobility. Pt is very pleasant and motivated. Discussed activity recommendations and progression.  ?   ? ?Recommendations for follow up therapy are one component of a multi-disciplinary discharge planning process, led by the attending physician.  Recommendations may be updated based on patient status, additional functional criteria and insurance authorization. ? ?Follow Up Recommendations Other (comment) (Pulmonary rehab) ? ?  ?Assistance Recommended at Discharge PRN  ?Patient can return home with the following ? Assistance with cooking/housework;Help with stairs or ramp for entrance ? ?  ?Equipment Recommendations None recommended by PT  ?Recommendations for Other Services ?    ?  ?Functional Status Assessment Patient has had a recent decline in their functional status and demonstrates the ability to make significant improvements in function in a reasonable and predictable amount of time.  ? ?  ?Precautions / Restrictions Precautions ?Precautions: Fall;Other (comment) ?Precaution Comments: watch O2 ?Restrictions ?Weight Bearing Restrictions: No  ? ?  ? ?Mobility ? Bed Mobility ?Overal bed mobility: Modified Independent ?  ?  ?  ?   ?  ?  ?  ?  ? ?Transfers ?Overall transfer level: Needs assistance ?Equipment used: None ?Transfers: Sit to/from Stand ?Sit to Stand: Supervision ?  ?  ?  ?  ?  ?  ?  ? ?Ambulation/Gait ?Ambulation/Gait assistance: Supervision, Min guard ?Gait Distance (Feet): 240 Feet ?Assistive device: None ?Gait Pattern/deviations: Step-through pattern, Decreased stride length ?Gait velocity: decreased ?  ?  ?General Gait Details: Pt with one lateral LOB requiring min guard assist to correct, otherwise supervision for safety. ? ?Stairs ?  ?  ?  ?  ?  ? ?Wheelchair Mobility ?  ? ?Modified Rankin (Stroke Patients Only) ?  ? ?  ? ?Balance Overall balance assessment: Mild deficits observed, not formally tested ?  ?  ?  ?  ?  ?  ?  ?  ?  ?  ?  ?  ?  ?  ?  ?  ?  ?  ?   ? ? ? ?Pertinent Vitals/Pain Pain Assessment ?Pain Assessment: No/denies pain  ? ? ?Home Living Family/patient expects to be discharged to:: Private residence ?Living Arrangements: Spouse/significant other ?Available Help at Discharge: Family ?Type of Home: House ?Home Access: Stairs to enter ?  ?Entrance Stairs-Number of Steps: 2 ?  ?Home Layout: One level ?Home Equipment: Cane - single point;Crutches ?   ?  ?Prior Function Prior Level of Function : Independent/Modified Independent ?  ?  ?  ?  ?  ?  ?Mobility Comments: Prior to 2 weeks ago, pt working in yard, picking dog up. Noticed a change in this time period of decreased energy,no longer able to pick 12 lb dog up ?  ?  ? ? ?Hand Dominance  ?   ? ?  ?Extremity/Trunk Assessment  ?  Upper Extremity Assessment ?Upper Extremity Assessment: Overall WFL for tasks assessed ?  ? ?Lower Extremity Assessment ?Lower Extremity Assessment: Overall WFL for tasks assessed ?  ? ?   ?Communication  ? Communication: No difficulties  ?Cognition Arousal/Alertness: Awake/alert ?Behavior During Therapy: [facility] for tasks assessed/performed ?Overall Cognitive Status: Within Functional Limits for tasks assessed ?  ?  ?  ?  ?  ?  ?  ?  ?  ?  ?   ?  ?  ?  ?  ?  ?  ?  ?  ? ?  ?General Comments   ? ?  ?Exercises    ? ?Assessment/Plan  ?  ?PT Assessment Patient needs continued PT services  ?PT Problem List Decreased strength;Decreased activity tolerance;Decreased balance;Decreased mobility;Cardiopulmonary status limiting activity ? ?   ?  ?PT Treatment Interventions Gait training;Stair training;Functional mobility training;Therapeutic exercise;Therapeutic activities;Balance training;Patient/family education   ? ?PT Goals (Current goals can be found in the Care Plan section)  ?Acute Rehab PT Goals ?Patient Stated Goal: return to baseline, increased energy ?PT Goal Formulation: With patient ?Time For Goal Achievement: 06/15/21 ?Potential to Achieve Goals: Good ? ?  ?Frequency Min 3X/week ?  ? ? ?Co-evaluation   ?  ?  ?  ?  ? ? ?  ?AM-PAC PT "6 Clicks" Mobility  ?Outcome Measure Help needed turning from your back to your side while in a flat bed without using bedrails?: None ?Help needed moving from lying on your back to sitting on the side of a flat bed without using bedrails?: None ?Help needed moving to and from a bed to a chair (including a wheelchair)?: A Little ?Help needed standing up from a chair using your arms (e.g., wheelchair or bedside chair)?: A Little ?Help needed to walk in hospital room?: A Little ?Help needed climbing 3-5 steps with a railing? : A Little ?6 Click Score: 20 ? ?  ?End of Session Equipment Utilized During Treatment: Oxygen ?Activity Tolerance: Patient tolerated treatment well ?Patient left: in chair;with call bell/phone within reach ?Nurse Communication: Mobility status ?PT Visit Diagnosis: Unsteadiness on feet (R26.81);Difficulty in walking, not elsewhere classified (R26.2) ?  ? ?Time: 9407-6808 ?PT Time Calculation (min) (ACUTE ONLY): 27 min ? ? ?Charges:   PT Evaluation ?$PT Eval Moderate Complexity: 1 Mod ?PT Treatments ?$Therapeutic Activity: 8-22 mins ?  ?   ? ? ?Wyona Almas, PT, DPT ?Acute Rehabilitation Services ?Pager  [phone] ?Office [phone] ? ? ?Eduardo Fuentes ?06/01/2021, 1:27 PM ? ?

## 2021-06-01 NOTE — Progress Notes (Signed)
SATURATION QUALIFICATIONS: (This note is used to comply with regulatory documentation for home oxygen) ? ?Patient Saturations on Room Air at Rest = 88% ? ?Patient Saturations on Room Air while Ambulating = Not tested due to desaturation to 88% at rest. ? ?Patient Saturations on 8 Liters of oxygen while Ambulating = 88% ? ?Please briefly explain why patient needs home oxygen: To maintain oxygen saturation > 88% at rest and with ambulating. ? ?Wyona Almas, PT, DPT ?Acute Rehabilitation Services ?Pager [phone] ?Office [phone] ? ?

## 2021-06-01 NOTE — Progress Notes (Incomplete)
SATURATION QUALIFICATIONS: (This note is used to comply with regulatory documentation for home oxygen)  Patient Saturations on Room Air at Rest = 85%   

## 2021-06-01 NOTE — TOC Transition Note (Signed)
Transition of Care (TOC) - CM/SW Discharge Note ? ? ?Patient Details  ?Name: ICARUS PARTCH ?MRN: [id number] ?Date of Birth: [birth year] ? ?Transition of Care (TOC) CM/SW Contact:  ?Konrad Penta, RN ?Phone Number: [phone] ?06/01/2021, 3:08 PM ? ? ?Clinical Narrative:   Patient wore home oxygen prior to admission. He uses [employer]. Writer contacted [employer] to confirm the max amount of liter flow of oxygen. Confirmed with Shanon Brow [facility] driver) and [employer] rep that 10 liters is the max. Mr. Pagliarulo is on 8L/[place]. A new tank of oxygen will be delivered to room prior to discharge. Oxygen concentrator will be delivered to Mr. Sires home today. No therapy recommendations. Mr. Eckert is from home with wife.  ? ?Spoke with Mr. Fieldhouse and spouse to make aware of oxygen arrangements. Made MD and nursing aware as well.  ? ?No further TOC identified. Please re-consult TOC if needed.  ? ? ? ?Final next level of care: Home/Self Care ?Barriers to Discharge: No Barriers Identified ? ? ?Patient Goals and CMS Choice ?Patient states their goals for this hospitalization and ongoing recovery are:: return home ?CMS Medicare.gov Compare Post Acute Care list provided to:: Patient ?Choice offered to / list presented to : Patient ? ?Discharge Placement ?  ?           ?  ?  ?  ?  ? ?Discharge Plan and Services ?  ?  ?           ?DME Arranged: Oxygen ?DME Agency: [employer] ?Date DME Agency Contacted: 06/01/21 ?Time DME Agency Contacted: 1430 ?Representative spoke with at DME Agency: Shanon Brow ?  ?  ?  ?  ?  ? ?Social Determinants of Health (SDOH) Interventions ?  ? ? ?Readmission Risk Interventions ?   ? View : No data to display.  ?  ?  ?  ? ? ? ? ? ?

## 2021-06-03 MED FILL — Fentanyl Citrate Preservative Free (PF) Inj 100 MCG/2ML: INTRAMUSCULAR | Qty: 2 | Status: AC

## 2021-06-03 MED FILL — Morphine Sulfate Inj 2 MG/ML: INTRAMUSCULAR | Qty: 0.5 | Status: AC

## 2021-06-06 ENCOUNTER — Telehealth: Payer: Self-pay | Admitting: Internal Medicine

## 2021-06-06 NOTE — Telephone Encounter (Signed)
Called patient and he states that he has been on oxygen for about 1 week now. Patient is wanting to know if Dr Guadalupe [name] still wants him to keep his 30 minute PFT appointment before he sees him 06/13/21. I advised patient that he should still plan on attending that PFT and that if we here differently from the doctor we will call. Patient states he is on 8L now with exertion.  ? ?Dr Guadalupe [name] please advise. Do you still want patient to get PFTs?  ?

## 2021-06-07 NOTE — Telephone Encounter (Signed)
Yes keep PFT appt ?

## 2021-06-07 NOTE — Telephone Encounter (Signed)
Called and spoke with pt letting him know that MR does want him to keep PFT appt as scheduled followed by OV and he verbalized understanding. Nothing further needed. ?

## 2021-06-12 DIAGNOSIS — N1832 Chronic kidney disease, stage 3b: Secondary | ICD-10-CM | POA: Diagnosis not present

## 2021-06-12 DIAGNOSIS — I4891 Unspecified atrial fibrillation: Secondary | ICD-10-CM | POA: Diagnosis not present

## 2021-06-12 DIAGNOSIS — I5032 Chronic diastolic (congestive) heart failure: Secondary | ICD-10-CM | POA: Diagnosis not present

## 2021-06-12 DIAGNOSIS — J84112 Idiopathic pulmonary fibrosis: Secondary | ICD-10-CM | POA: Diagnosis not present

## 2021-06-13 ENCOUNTER — Other Ambulatory Visit: Payer: Self-pay | Admitting: Internal Medicine

## 2021-06-13 ENCOUNTER — Ambulatory Visit: Payer: PPO | Admitting: Internal Medicine

## 2021-06-13 ENCOUNTER — Telehealth (HOSPITAL_COMMUNITY): Payer: Self-pay

## 2021-06-13 DIAGNOSIS — J84112 Idiopathic pulmonary fibrosis: Secondary | ICD-10-CM

## 2021-06-13 NOTE — Telephone Encounter (Signed)
Called and spoke with pt in regards to PR, pt stated he is not interested at this time and if he changes his mind he will contact us. ?  ?Closed referral ?

## 2021-06-18 DIAGNOSIS — I10 Essential (primary) hypertension: Secondary | ICD-10-CM | POA: Diagnosis not present

## 2021-06-18 DIAGNOSIS — E7800 Pure hypercholesterolemia, unspecified: Secondary | ICD-10-CM | POA: Diagnosis not present

## 2021-06-18 DIAGNOSIS — I4891 Unspecified atrial fibrillation: Secondary | ICD-10-CM | POA: Diagnosis not present

## 2021-06-18 DIAGNOSIS — I5032 Chronic diastolic (congestive) heart failure: Secondary | ICD-10-CM | POA: Diagnosis not present

## 2021-06-18 DIAGNOSIS — F321 Major depressive disorder, single episode, moderate: Secondary | ICD-10-CM | POA: Diagnosis not present

## 2021-06-21 ENCOUNTER — Encounter: Payer: Self-pay | Admitting: Internal Medicine

## 2021-06-21 ENCOUNTER — Ambulatory Visit: Payer: PPO | Admitting: Internal Medicine

## 2021-06-21 VITALS — BP 112/68 | HR 89 | Temp 98.1°F | Ht 69.0 in | Wt 156.6 lb

## 2021-06-21 DIAGNOSIS — Z5181 Encounter for therapeutic drug level monitoring: Secondary | ICD-10-CM | POA: Diagnosis not present

## 2021-06-21 DIAGNOSIS — R053 Chronic cough: Secondary | ICD-10-CM | POA: Diagnosis not present

## 2021-06-21 DIAGNOSIS — R634 Abnormal weight loss: Secondary | ICD-10-CM | POA: Diagnosis not present

## 2021-06-21 DIAGNOSIS — J9611 Chronic respiratory failure with hypoxia: Secondary | ICD-10-CM

## 2021-06-21 DIAGNOSIS — K521 Toxic gastroenteritis and colitis: Secondary | ICD-10-CM | POA: Diagnosis not present

## 2021-06-21 DIAGNOSIS — J84112 Idiopathic pulmonary fibrosis: Secondary | ICD-10-CM | POA: Diagnosis not present

## 2021-06-21 MED ORDER — BENZONATATE 200 MG PO CAPS: 200.0000 mg | ORAL_CAPSULE | Freq: Three times a day (TID) | ORAL | 3 refills | Status: AC

## 2021-06-21 NOTE — Patient Instructions (Addendum)
ICD-10-CM   ?1. IPF (idiopathic pulmonary fibrosis) ([place])  J84.112   ?  ?2. Chronic respiratory failure with hypoxia ([place])  J96.11   ?  ?3. Chronic cough  R05.3   ?  ?4. Drug-induced weight loss  R63.4   ? T50.905A   ?  ?5. Drug-induced diarrhea  K52.1   ?  ?6. Therapeutic drug monitoring  Z51.81   ?  ? ? ?I think what he had in April 2023 was a pulmonary fibrosis flareup. ? -This is an extremely bad prognostic sign ?You now have a new low baseline -this explains worsening shortness of breath and cough ?Weight loss and chronic diarrhea probably related nintedanib ?Still I think nintedanib is probably helping you ?Understand that cough is the main dominant symptom right now ?Overall understand that your appetite to do clinical trial is diminished ? ?Plan ?-For palliative treatment of cough: ? -Try Tessalon cough Perles 200 mg 3 times daily x30 days with 3 refills ? -If this does not work we will have to try opioids or chronic prednisone [these have side effects] or clinical trial ? - Continue nintedanib for now ?-Continue oxygen but make sure pulse ox stays greater than 88% ? - you need upto 8L with exdrtion ?-Check CBC, chemistry, BNP, liver function test 06/21/2021 ?- decide on lasix continuation based on study results ? - for now continue ?-Do spirometry and DLCO in the next few to several weeks ? ?Follow-up ?- Return to see Dr. Chase Caller or nurse practitioner in the next few to several weeks but after PFT ? -Symptom score at follow-up ? -Discuss  disease modifying or chronic cough study at follow-up ? - discuss goals of care and ACP at next 1-3 visits ? ?

## 2021-06-21 NOTE — Addendum Note (Signed)
Addended by: Lorretta Harp on: 06/21/2021 03:56 PM ? ? Modules accepted: Orders ? ?

## 2021-06-21 NOTE — Progress Notes (Addendum)
? ? ? ? ?Primary Care Provider: Lujean Amel, MD ?Cardiologist: Glenetta Hew, MD ?Electrophysiologist: None  ?Pulmonologist: Dr. Chase Caller ?GI: Dr. Henrene Pastor ?Urology: Dr. Serita Butcher ?Neuro: Dr. Jannifer Franklin ?Psych: Dr. Kathrynn Humble ?  ? ?OV 01/29/2018 ? ?Chief Complaint  ?Patient presents with  ? Pulmonary Consult  ?  self referral, abnormal CT, ? ILD from CT scan, SOB with exertion   ? ? ?Eduardo Fuentes presents self-referral for interstitial lung disease.  His wife Leith Szafranski used to be seen by me some years ago when she had surgical lung biopsy for ruling out interstitial lung disease which we did.  Patient is a non-smoker but has long suffered from acid reflux and esophageal stenosis.  He also has a sliding hiatal hernia.  He says since spring 2019 he has had insidious onset of shortness of breath that is progressive associated with some cough.  During this time he is also had worsening endogenous depression.  He was seeing a psychiatrist multiple antidepressants have failed.  Electroconvulsive therapy has been recommended and he had one on January 27, 2018 2 days ago.  During this time a chest x-ray was done as part of pre-electroconvulsive therapy evaluation and this suggested interstitial lung disease.  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  I agree with the radiologist.  The some amount of mediastinal adenopathy that is felt to be reactive.  There is also a sliding hiatal hernia.  It is described that these findings are new compared to 2014 chest x-ray.  At this point in time walking desaturation test he did not desaturate but dropped 4 points and pulse ox ? ? ? ?[place] Integrated Comprehensive ILD Questionnaire ? ?Symptoms: He reports shortness of breath onset gradually for the last 1 year.  It is getting worse with time.  He does have episodic dyspnea.  When he does shopping it is level 1.  When he walks on a level ground  with others of his age at this level 1 walking up stairs at this level 1 but walking at his own pace at this level 2 and taking a shower at level 3.  He does have associated arthralgia and cough.  He is not sure when the cough started.  It is present at all positions since it started it is the same as moderate in intensity.  He does cough at night.  He does bring up phlegm which is clear in color.  It gets worse when he lies down and it affects his voice and he does clear his throat.  He does not feel a tickle.  There is no wheezing. ? ? ?Past Medical History : This is positive for major depression but he circled no for asthma, COPD.  Surgical no for heart failure.  Surgical no for collagen vascular disease with sleep apnea.  He did not answer for acid reflux which he does have..  Denies diabetes or thyroid disease or heart disease or pulmonary embolism or pneumonia ? ? ?ROS:  .  Positive for difficulty swallowing due to his esophageal stricture history.  Does have nausea and acid reflux and snoring.  Otherwise no vomiting is positive for soreness across the body after electroconvulsive therapy 2 days ago. ? ? ?FAMILY HISTORY of LUNG DISEASE: Denies family history of any pulmonary fibrosis COPD or asthma sarcoidosis ? ? ?EXPOSURE HISTORY: Denies smoking cigarettes or tobacco or marijuana or cocaine.  No intravenous drug abuse. ? ? ?  HOME and HOBBY DETAILS : He did not list the age of the home but is lived in this home for 25 [place].  There is no mold in the house of mildew.  The house is not damp.  Does not use a CPAP mask.  No nebulizer machine.  No use of Jacuzzi.  Nose fountain in the house.  No pet birds or gerbils.  Does not use feather pillows.  He is not sure if there is mold in the [facility] duct system.  Does not play wind instruments.  He occasionally mows the lawn ? ? ?OCCUPATIONAL HISTORY (122 questions) : Positive for social gardening.  Did report that he is used feather pillows and blankets  in the past but otherwise negative including working in a dusty environment ? ? ?PULMONARY TOXICITY HISTORY (27 items): Positive for cancer chemotherapy but he does not know the details ? ? ? ? ? ?OV 03/16/2018 ? ?Subjective:  ?Patient ID: MALCOM SELMER, male , DOB: 01-02-1951 , age 71 y.o. , MRN: [id number] , ADDRESS: [address] ?[place] [address] ? ? ?03/16/2018 -   ?Chief Complaint  ?Patient presents with  ? Follow-up  ?  Pt started OFEV 02/27/2018.  Pt states he has been doing okay since last visit. Denies any current complaints with the OFEV. Pt states he does have SOB which is mainy with activities.  ? ?IPF [clinical diagnosis based on classic UIP scan negative serology and age and male gender; diagnosis given February 11, 2017] ? ?HPI ?Eduardo Fuentes 71 y.o. -IPF follow-up.  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  He was started on nintedanib.  He started this on February 27, 2018.  Suffice tolerating it well without any problems.  He is debating about starting pulmonary rehabilitation.  Mostly does not want to pay $20 a week for this.  The other alternative is that he can go to [facility] [employer] and exercise by himself without any co-pay but there is no trainer.  I asked him to look at rehab as a short-term thing with the help of a trainer and the cost of a trainer being $20 a week.  He then opened up to the idea of attending pulmonary rehabilitation.  He still and his wife both had questions about IPF and etiology.  Be on sliding hiatal hernia but not able to discover anything else.  His reflux is better controlled now with PPI.  In terms of her shortness of breath this is stable.  In terms of his cough this is stable.  He continues to deal with depression but is not suicidal or homicidal.  His walking desaturation test today shows stability.  Wife wanted some education material about IPF and nintedanib. ? ? ? ? ? ? ? ? ?OV 04/29/2018 ? ?Subjective:  ?Patient ID: KENDRIC SINDELAR, male , DOB:  1950-08-17 , age 26 y.o. , MRN: [id number] , ADDRESS: [address] ?[place] [address] ? ? ?04/29/2018 -   ?Chief Complaint  ?Patient presents with  ? Follow-up  ?  Pt states he has had some weakness going on which he states could be due to not eating enough with the OFEV. Pt also has had some loose stools as well as nausea.  ? ? ?IPF [clinical diagnosis based on classic UIP scan negative serology and age and male gender; diagnosis given February 11, 2017 and on Ofev since 02/27/2018 ? ? ?HPI ?Eduardo Fuentes 71 y.o. -  presents for follow-up of his IPF.  He is now doing nintedanib for 2 months.  Overall stable.  Overall tolerating nintedanib really well.  He had some low appetite.  There.  Some nausea he had and that yesterday he had some diarrhea after eating pizza.  He thinks it is a pizza that caused the diarrhea not the nintedanib.  In terms of his dyspnea he stable quality of life is stable.  He said he is doing well with pulmonary rehabilitation.  Maintain his pulse ox 90% or so.  He is asking if the nintedanib is helping him in terms of disease stability.  Discussed that this might not be known fully in an individual.  His mood is better ? ? ?Results for JAYVAN, MCSHAN (MRN [id number]) as of 03/16/2018 14:30 ? Ref. Range 01/29/2018 12:42  ?ASPERGILLUS FUMIGATUS Latest Ref Range: NEGATIVE  NEGATIVE  ?[place]on Serum Latest Ref Range: NEGATIVE  NEGATIVE  ?Anti Nuclear Antibody(ANA) Latest Ref Range: NEGATIVE  POSITIVE (A)  ?ANA Pattern 1 Unknown Nuclear, Homogeneous (A)  ?ANA Titer 1 Latest Units: titer 1:80 (H)  ?Cyclic Citrullin Peptide Ab Latest Units: UNITS <16  ?RA Latex Turbid. Latest Ref Range: <14 IU/mL <14  ? ?ROS ?- per HPI ? ?Results for JAYMOND, WAAGE (MRN [id number]) as of 03/16/2018 14:54 ? Ref. Range 03/08/2018 11:50  ?AST Latest Ref Range: 0 - 37 U/L 23  ?ALT Latest Ref Range: 0 - 53 U/L 15  ?Total Protein Latest Ref Range: 6.0 - 8.3 g/dL 7.2  ? ? ?ROS ?- per HPI ? ? ? ?OV 04/01/2019 ? ?Subjective:   ?Patient ID: KEMANI DEMARAIS, male , DOB: Mar 02, 1950 , age 78 y.o. , MRN: [id number] , ADDRESS: [address] ?[place] [address] ? ? ?04/01/2019 -   ?Chief Complaint  ?Patient presents with  ? Follow-up  ?  Pt

## 2021-06-25 ENCOUNTER — Other Ambulatory Visit (INDEPENDENT_AMBULATORY_CARE_PROVIDER_SITE_OTHER): Payer: PPO

## 2021-06-25 DIAGNOSIS — Z5181 Encounter for therapeutic drug level monitoring: Secondary | ICD-10-CM

## 2021-06-25 DIAGNOSIS — J84112 Idiopathic pulmonary fibrosis: Secondary | ICD-10-CM

## 2021-06-25 LAB — CBC WITH DIFFERENTIAL/PLATELET
Basophils Absolute: 0.1 10*3/uL (ref 0.0–0.1)
Basophils Relative: 0.7 % (ref 0.0–3.0)
Eosinophils Absolute: 0.2 10*3/uL (ref 0.0–0.7)
Eosinophils Relative: 2.6 % (ref 0.0–5.0)
HCT: 41.8 % (ref 39.0–52.0)
Hemoglobin: 14 g/dL (ref 13.0–17.0)
Lymphocytes Relative: 14.6 % (ref 12.0–46.0)
Lymphs Abs: 1.1 10*3/uL (ref 0.7–4.0)
MCHC: 33.5 g/dL (ref 30.0–36.0)
MCV: 96.6 fl (ref 78.0–100.0)
Monocytes Absolute: 0.5 10*3/uL (ref 0.1–1.0)
Monocytes Relative: 6.9 % (ref 3.0–12.0)
Neutro Abs: 5.4 10*3/uL (ref 1.4–7.7)
Neutrophils Relative %: 75.2 % (ref 43.0–77.0)
Platelets: 268 10*3/uL (ref 150.0–400.0)
RBC: 4.33 Mil/uL (ref 4.22–5.81)
RDW: 15.6 % — ABNORMAL HIGH (ref 11.5–15.5)
WBC: 7.2 10*3/uL (ref 4.0–10.5)

## 2021-06-25 LAB — BASIC METABOLIC PANEL
BUN: 13 mg/dL (ref 6–23)
CO2: 43 mEq/L — ABNORMAL HIGH (ref 19–32)
Calcium: 8.9 mg/dL (ref 8.4–10.5)
Chloride: 91 mEq/L — ABNORMAL LOW (ref 96–112)
Creatinine, Ser: 1.46 mg/dL (ref 0.40–1.50)
GFR: 48.06 mL/min — ABNORMAL LOW (ref >60.00)
Glucose, Bld: 107 mg/dL — ABNORMAL HIGH (ref 70–99)
Potassium: 3.7 mEq/L (ref 3.5–5.1)
Sodium: 138 mEq/L (ref 135–145)

## 2021-06-25 LAB — HEPATIC FUNCTION PANEL
ALT: 15 U/L (ref 0–53)
AST: 28 U/L (ref 0–37)
Albumin: 3.8 g/dL (ref 3.5–5.2)
Alkaline Phosphatase: 80 U/L (ref 39–117)
Bilirubin, Direct: 0.1 mg/dL (ref 0.0–0.3)
Total Bilirubin: 0.6 mg/dL (ref 0.2–1.2)
Total Protein: 6.9 g/dL (ref 6.0–8.3)

## 2021-06-25 LAB — BRAIN NATRIURETIC PEPTIDE: Pro B Natriuretic peptide (BNP): 44 pg/mL (ref 0.0–100.0)

## 2021-06-26 ENCOUNTER — Telehealth: Payer: Self-pay | Admitting: Internal Medicine

## 2021-06-26 NOTE — Telephone Encounter (Signed)
MR, please advise if pt still needs to be on lasix. Think you were going to review labwork from last OV and then decide. Pt said he is almost out of it and needs to know if he needs another refill. ?

## 2021-06-27 ENCOUNTER — Ambulatory Visit: Payer: PPO | Admitting: Nurse Practitioner

## 2021-06-27 NOTE — Telephone Encounter (Signed)
Pt called in requesting that a new Rx be called in for his fluid pill Lasix. Pt communicated that he on has a 4 day supply left. Pt pharmacy [facility] on [address]. During the phone call Mr.Jahn said he's SOB, and he wants a call asap about his furosemide '20mg'$   ? ?

## 2021-06-28 NOTE — Telephone Encounter (Signed)
Called and spoke with pt letting him know that MR needed to review recent labs to determine if he still needed to be on lasix. Stated to him that we would call him back this afternoon with an update and he verbalized understanding. ? ? ? ?Routing to MR as high priority. ?

## 2021-06-28 NOTE — Telephone Encounter (Signed)
? ?  BNP is normal and he can stop his Lasix when his current supply runs out and we can monitor ? ?LABS  ? ? Latest Reference Range & Units 05/29/21 10:45 06/25/21 14:39  ?Pro B Natriuretic peptide (BNP) 0.0 - 100.0 pg/mL 926.0 (H) 44.0  ?(H): Data is abnormally high ? ?PULMONARY ?No results for input(s): PHART, PCO2ART, PO2ART, HCO3, TCO2, O2SAT in the last 168 hours. ? ?Invalid input(s): PCO2, PO2 ? ?CBC ?Recent Labs  ?Lab 06/25/21 ?1439  ?HGB 14.0  ?HCT 41.8  ?WBC 7.2  ?PLT 268.0  ? ? ?COAGULATION ?No results for input(s): INR in the last 168 hours. ? ?CARDIAC ? No results for input(s): TROPONINI in the last 168 hours. ?Recent Labs  ?Lab 06/25/21 ?1439  ?PROBNP 44.0  ? ? ? ?CHEMISTRY ?Recent Labs  ?Lab 06/25/21 ?1439  ?NA 138  ?K 3.7  ?CL 91*  ?CO2 43*  ?GLUCOSE 107*  ?BUN 13  ?CREATININE 1.46  ?CALCIUM 8.9  ? ?Estimated Creatinine Clearance: 46.4 mL/min (by C-G formula based on SCr of 1.46 mg/dL). ? ? ?LIVER ?Recent Labs  ?Lab 06/25/21 ?1439  ?AST 28  ?ALT 15  ?ALKPHOS 80  ?BILITOT 0.6  ?PROT 6.9  ?ALBUMIN 3.8  ? ? ? ?INFECTIOUS ?No results for input(s): LATICACIDVEN, PROCALCITON in the last 168 hours. ? ? ?ENDOCRINE ?CBG (last 3)  ?No results for input(s): GLUCAP in the last 72 hours. ? ? ? ? ? ? ?IMAGING x48h  - image(s) personally visualized  -   highlighted in bold ?No results found. ? ?

## 2021-06-28 NOTE — Telephone Encounter (Signed)
Called and spoke with patient. He verbalized understanding. ? ?Nothing further needed at time of call.  ?

## 2021-07-01 DIAGNOSIS — R0602 Shortness of breath: Secondary | ICD-10-CM | POA: Diagnosis not present

## 2021-07-04 ENCOUNTER — Ambulatory Visit (INDEPENDENT_AMBULATORY_CARE_PROVIDER_SITE_OTHER): Payer: PPO | Admitting: Internal Medicine

## 2021-07-04 DIAGNOSIS — J84112 Idiopathic pulmonary fibrosis: Secondary | ICD-10-CM

## 2021-07-04 LAB — PULMONARY FUNCTION TEST
DL/VA % pred: 80 %
DL/VA: 3.24 ml/min/mmHg/L
DLCO cor % pred: 37 %
DLCO cor: 9.49 ml/min/mmHg
DLCO unc % pred: 37 %
DLCO unc: 9.32 ml/min/mmHg
FEF 25-75 Pre: 1.19 L/sec
FEF2575-%Pred-Pre: 50 %
FEV1-%Pred-Pre: 41 %
FEV1-Pre: 1.28 L
FEV1FVC-%Pred-Pre: 104 %
FEV6-%Pred-Pre: 40 %
FEV6-Pre: 1.61 L
FEV6FVC-%Pred-Pre: 106 %
FVC-%Pred-Pre: 39 %
FVC-Pre: 1.66 L
Pre FEV1/FVC ratio: 77 %
Pre FEV6/FVC Ratio: 100 %

## 2021-07-04 NOTE — Progress Notes (Signed)
Performed Spirometry and DLCO. ?

## 2021-07-04 NOTE — Patient Instructions (Signed)
Performed Spirometry and DLCO Today.  ?

## 2021-07-05 ENCOUNTER — Encounter: Payer: Self-pay | Admitting: Primary Care

## 2021-07-05 ENCOUNTER — Ambulatory Visit: Payer: PPO | Admitting: Primary Care

## 2021-07-05 VITALS — BP 108/62 | HR 80 | Temp 98.2°F | Ht 69.0 in | Wt 160.4 lb

## 2021-07-05 DIAGNOSIS — J9611 Chronic respiratory failure with hypoxia: Secondary | ICD-10-CM | POA: Diagnosis not present

## 2021-07-05 DIAGNOSIS — I5032 Chronic diastolic (congestive) heart failure: Secondary | ICD-10-CM | POA: Diagnosis not present

## 2021-07-05 DIAGNOSIS — J84112 Idiopathic pulmonary fibrosis: Secondary | ICD-10-CM

## 2021-07-05 DIAGNOSIS — R5381 Other malaise: Secondary | ICD-10-CM

## 2021-07-05 NOTE — Patient Instructions (Addendum)
Recommendation: ?Continue OFEV ?Take delsym cough syrup OTC twice a day  ?Use tesslon perles every 8 hours as needed for cough ?Stop lasix as directed  ?You need to wear 2L at rest; increased to 8L with exertion to maintain O2 between 90-94% ? ?Orders: ?Home physical eval/treat  ? ?Follow-up: ?6-8 weeks with Eduardo Fuentes 3 months with Eduardo Fuentes (32 MIN ILD) ?

## 2021-07-05 NOTE — Progress Notes (Signed)
? ?'@Patient'$  ID: Eduardo Fuentes, male    DOB: 02/03/1951, 71 y.o.   MRN: [id number] ? ?Chief Complaint  ?Patient presents with  ? Follow-up  ?  PFT performed 5/11.  Pt states he is still having spells when he exerts himself that, he still becomes SOB.  ? ? ?Referring provider: ?Lujean Amel, MD ? ?HPI: ?71 year old male, never smoked. PMH significant for coronary artery calcification, afib, IPF, prostate CA, hyperlipidemia, GAD, panic disorder with agoraphobia.  ? ?Previous [facility] pulmonary encounter: ?03/15/21- Dr. Chase Caller  ?IPF [clinical diagnosis based on classic UIP scan negative serology and age and male gender; diagnosis given February 11, 2017  ? -  Ofev since 02/27/2018.  Stopped nintedanib May 2021 due to weight loss ? - .  Started pirfenidone approximately June 3 or July 29, 2019. -> stopped nov 2021 due to weight loss (worse than with ofev) and GI side effects ?  -> wrestart ofev 02/09/2020 ? ?Overall progressive between 2019 and 2021 but is stable in the last 1 year between October 2020 and November/December 2021 -> stable April 2022 -> wose Jan 2023 ? ?Eduardo Fuentes 71 y.o. -returns for follow-up.  He had his last spirometry in September 2022.  He had another spirometry now.  Shows a 9% decline in FVC in a matter of a few months.  But subjectively he tells me that he is doing stable.  His overall symptom score is 6 and the same.  He is working out quite well in [employer].  His main complaint is that he had some itching in his bilateral thigh anteriorly a few weeks ago this then suddenly turned into burning a week ago and but now it is improved.  He did not know why.  He also tells me that he is having low backache because he sleeps 13 hours a day and he also works out.  Other than that he was feeling fine.  However I did indicate to him that his spirometry has declined.  I also indicated to him that he has had weight loss of at least 20 pounds in 3 years and 10-11 pounds in the last 2 years.  He is surprised  by this.  He does admit that the nintedanib reduces his appetite.  At first he said he did not have much diarrhea but later I saw him wearing diapers.  He tells me that he is having 5-6 bowel movements in the morning.  They are not as loose as they once were but is still significant.  I explained to him that the weight loss and the diarrhea probably nintedanib side effects.  He does not want to give up nintedanib because he feels it is beneficial even in the face of progression.  Otherwise if worries that the progression would be worse without the nintedanib.  Then he had another symptom as he was ready to leave.  He told me that his cough is actually worse in the last year given the marked to the level 2 out of 5.  He wanted know if he could take over-the-counter medications for the cough. ? ?In essence ?- Progression in PFT ?- Weight loss and diarrhea due to nintedanib and some abdominal cramping as well.  He is not wanting to give up on nintedanib. ?- Also worsening cough ?- He is interested in clinical trial as a care option [previously screened fail for another study] ? ? ?05/29/2021- Interim hx  ?Patient presents today for acute OV. Patient  reports increased shortness of breath x 2-3 weeks. Feels he has went down hill the last couple of weeks. He was able to mow his yard last week but needed to stop several times to recover. He checked his O2 at home and reports it was in the low 70s. His O2 was 78-80% today on room air. He has no acute respiratory symptoms except for worsening dyspnea felt to be due to progression of IPF. He is taking Ofev as prescribed. Being evaluated for research trials as care option. Echocardiogram showed grade 1 DD. Due for spirometry/ follow-up in April ? ?06/21/21- Dr. Chase Caller ?Chief Complaint  ?Patient presents with  ? Follow-up  ?  Pt states that he has been having problems with diarrhea and also states that he has had coughing spells. States the cough is worse at night. Pt states  that he is still having problems with SOB even with wearing oxygen.  ? ?IPF [clinical diagnosis based on classic UIP scan negative serology and age and male gender; diagnosis given February 11, 2017  ? -  Ofev since 02/27/2018.  Stopped nintedanib May 2021 due to weight loss ? - .  Started pirfenidone approximately June 3 or July 29, 2019. -> stopped nov 2021 due to weight loss (worse than with ofev) and GI side effects ?  -> wrestart ofev 02/09/2020 ? ? ?Overall progressive between 2019 and 2021 but is stable in the last 1 year between October 2020 and November/December 2021 -> stable April 2022 -> wose Jan 2023 -> flare admit April 2023 ? ? ?HPI ?Eduardo Fuentes 71 y.o. -returns for follow-up.  He continues on Internet.  He presents with his wife.  He was having declining pulmonary function.  We called him for a research protocol because he was interested in it but then he was very short of breath.  He tells me that he actually been short of breath with less energy with pulse ox in the 70s on room air for a week or 2.  He did not listen to his wife about calling the doctor.  He then presented acutely and then was hospitalized.  He was hospitalized for 3 days discharged on 06/01/2021.  Diagnosis of pulmonary fibrosis flareup and heart failure not otherwise specified.  He was desaturating enough that he was requiring 8-10 L with exertion.  He tells me now that at home he is doing some ADLs but mostly sedentary.  He has lost weight.  He continues on nintedanib.  He is on 2 L nasal cannula at rest.  He is able to do ADLs but he does transiently desaturate while brushing his teeth or going to the deck and then he will rest and he will get better.  He has not done oxygen titration.  He continues to have cough.  His symptom score is below.  He is asking for some palliative relief from cough. ? ?At this point in time he is less interested in clinical trials than he was before.  Nevertheless he acknowledges he is declining.  He  is frustrated by his decline because he feels it happened suddenly.Marland Kitchen  He was discharged on steroids. ? ? ?Last blood work/8/23 ?- Creatinine 1.2 mg percent ?-Hemoglobin 13.6 g% ?-He had a cardiac catheterization/6/23: Normal ejection fraction and nonobstructive coronary artery disease ?-D-dimer/5/23 normal ?- CT chest without contrast with UIP and groundglass opacity suggestive of flareup. ? ? ?07/05/2021- interim hx  ?Patient presents today for follow-up. Accompanied by his wife. Patient was treated  for ILD flare in April 2023, prior to this he had noted hypoxia at home for 2-3 weeks leading up to hospitalization. He saw Dr. Chase Caller for follow-up on 06/21/21. He continues on OFEV for ILD. He has soft-loose stools daily but denies diarrhea. He takes metamucil. Cough is his dominant symptom, 90% time he has a dry cough. Over the counter Delsym cough syrup has been helping along, tessalon perles '200mg'$  TID also help but is too expensive for daily use. Dr. Chase Caller has discuss optional for opioids vs chronic prednisone for management of cough if tessalon did not help. He would prefer not to go on oral prednisone if able. He reports decreased energy levels and appetite. He has gained 4 lbs since last visit. It is the first time he has gained weight in several years. BNP recently checked was normal and advised he could stop oral lasix. He is using 8L oxygen with exertion. ILD score today was 12 (previously 14).  ? ?  ?SYMPTOM SCALE - ILD 03/28/2019 ?  07/20/2019 - 180# ?Stop pfev 11/01/2019 ?182# start esbriet in June 2021 02/09/2020 ?189# - stopped esbriet 01/24/31 due to weight loss 11/08/2020 ?175 03/15/2021 ?169# 05/29/2021 ?On OFEV 06/21/2021 ?156# 07/05/2021 ?OFEV  ?O2 use ra ra       ra RA  ra 2L at rest; 8L on exertion  ?Shortness of Breath 0 -> 5 scale with 5 being worst (score 6 If unable to do)               ?At rest 0 0   0 0 0 '2 2 2  '$ ?Simple tasks - showers, clothes change, eating, shaving 0 0   0 0 1 2.'5 2 3   '$ ?Household (dishes, doing bed, laundry) '1 1   1 1 1 '$ NA 3 3- vacuuming   ?Shopping 1 0   '1 1 1 3 '$ na NA  ?Walking level at own pace '1 1   1 1 1 '$ 2.'5 2 3  '$ ?Walking up Stairs '2 2   3 2 2 4 3 4  '$ ?Total (30-36) Dysp

## 2021-07-08 ENCOUNTER — Encounter (HOSPITAL_COMMUNITY): Payer: Self-pay | Admitting: Emergency Medicine

## 2021-07-08 ENCOUNTER — Other Ambulatory Visit: Payer: Self-pay

## 2021-07-08 ENCOUNTER — Inpatient Hospital Stay (HOSPITAL_COMMUNITY)
Admission: EM | Admit: 2021-07-08 | Discharge: 2021-07-25 | DRG: 196 | Disposition: E | Payer: PPO | Attending: Internal Medicine | Admitting: Internal Medicine

## 2021-07-08 ENCOUNTER — Emergency Department (HOSPITAL_COMMUNITY): Payer: PPO

## 2021-07-08 DIAGNOSIS — J9621 Acute and chronic respiratory failure with hypoxia: Secondary | ICD-10-CM | POA: Diagnosis present

## 2021-07-08 DIAGNOSIS — R053 Chronic cough: Secondary | ICD-10-CM | POA: Diagnosis not present

## 2021-07-08 DIAGNOSIS — F411 Generalized anxiety disorder: Secondary | ICD-10-CM | POA: Diagnosis not present

## 2021-07-08 DIAGNOSIS — J189 Pneumonia, unspecified organism: Secondary | ICD-10-CM

## 2021-07-08 DIAGNOSIS — Z79899 Other long term (current) drug therapy: Secondary | ICD-10-CM

## 2021-07-08 DIAGNOSIS — E785 Hyperlipidemia, unspecified: Secondary | ICD-10-CM | POA: Diagnosis present

## 2021-07-08 DIAGNOSIS — J9611 Chronic respiratory failure with hypoxia: Secondary | ICD-10-CM | POA: Diagnosis present

## 2021-07-08 DIAGNOSIS — Z515 Encounter for palliative care: Secondary | ICD-10-CM

## 2021-07-08 DIAGNOSIS — Z66 Do not resuscitate: Secondary | ICD-10-CM | POA: Diagnosis present

## 2021-07-08 DIAGNOSIS — Z87442 Personal history of urinary calculi: Secondary | ICD-10-CM | POA: Diagnosis not present

## 2021-07-08 DIAGNOSIS — D72829 Elevated white blood cell count, unspecified: Secondary | ICD-10-CM | POA: Diagnosis present

## 2021-07-08 DIAGNOSIS — I2510 Atherosclerotic heart disease of native coronary artery without angina pectoris: Secondary | ICD-10-CM | POA: Diagnosis present

## 2021-07-08 DIAGNOSIS — R54 Age-related physical debility: Secondary | ICD-10-CM | POA: Diagnosis present

## 2021-07-08 DIAGNOSIS — Z8249 Family history of ischemic heart disease and other diseases of the circulatory system: Secondary | ICD-10-CM

## 2021-07-08 DIAGNOSIS — Z7901 Long term (current) use of anticoagulants: Secondary | ICD-10-CM | POA: Diagnosis not present

## 2021-07-08 DIAGNOSIS — Z820 Family history of epilepsy and other diseases of the nervous system: Secondary | ICD-10-CM

## 2021-07-08 DIAGNOSIS — Z8719 Personal history of other diseases of the digestive system: Secondary | ICD-10-CM

## 2021-07-08 DIAGNOSIS — Z9981 Dependence on supplemental oxygen: Secondary | ICD-10-CM

## 2021-07-08 DIAGNOSIS — I110 Hypertensive heart disease with heart failure: Secondary | ICD-10-CM | POA: Diagnosis not present

## 2021-07-08 DIAGNOSIS — W06XXXA Fall from bed, initial encounter: Secondary | ICD-10-CM | POA: Diagnosis not present

## 2021-07-08 DIAGNOSIS — R0600 Dyspnea, unspecified: Secondary | ICD-10-CM | POA: Diagnosis not present

## 2021-07-08 DIAGNOSIS — R0602 Shortness of breath: Secondary | ICD-10-CM

## 2021-07-08 DIAGNOSIS — I5033 Acute on chronic diastolic (congestive) heart failure: Secondary | ICD-10-CM | POA: Diagnosis present

## 2021-07-08 DIAGNOSIS — R0689 Other abnormalities of breathing: Secondary | ICD-10-CM | POA: Diagnosis not present

## 2021-07-08 DIAGNOSIS — Z7189 Other specified counseling: Secondary | ICD-10-CM | POA: Diagnosis not present

## 2021-07-08 DIAGNOSIS — I272 Pulmonary hypertension, unspecified: Secondary | ICD-10-CM | POA: Diagnosis not present

## 2021-07-08 DIAGNOSIS — Z8546 Personal history of malignant neoplasm of prostate: Secondary | ICD-10-CM | POA: Diagnosis not present

## 2021-07-08 DIAGNOSIS — R64 Cachexia: Secondary | ICD-10-CM | POA: Diagnosis present

## 2021-07-08 DIAGNOSIS — J84112 Idiopathic pulmonary fibrosis: Secondary | ICD-10-CM | POA: Diagnosis not present

## 2021-07-08 DIAGNOSIS — F339 Major depressive disorder, recurrent, unspecified: Secondary | ICD-10-CM | POA: Diagnosis not present

## 2021-07-08 DIAGNOSIS — Z888 Allergy status to other drugs, medicaments and biological substances status: Secondary | ICD-10-CM | POA: Diagnosis not present

## 2021-07-08 DIAGNOSIS — F4001 Agoraphobia with panic disorder: Secondary | ICD-10-CM | POA: Diagnosis not present

## 2021-07-08 DIAGNOSIS — Z818 Family history of other mental and behavioral disorders: Secondary | ICD-10-CM

## 2021-07-08 DIAGNOSIS — J188 Other pneumonia, unspecified organism: Secondary | ICD-10-CM | POA: Diagnosis not present

## 2021-07-08 DIAGNOSIS — K219 Gastro-esophageal reflux disease without esophagitis: Secondary | ICD-10-CM | POA: Diagnosis present

## 2021-07-08 DIAGNOSIS — Z20822 Contact with and (suspected) exposure to covid-19: Secondary | ICD-10-CM | POA: Diagnosis not present

## 2021-07-08 DIAGNOSIS — J9690 Respiratory failure, unspecified, unspecified whether with hypoxia or hypercapnia: Secondary | ICD-10-CM | POA: Diagnosis not present

## 2021-07-08 DIAGNOSIS — J80 Acute respiratory distress syndrome: Secondary | ICD-10-CM | POA: Diagnosis not present

## 2021-07-08 DIAGNOSIS — Z809 Family history of malignant neoplasm, unspecified: Secondary | ICD-10-CM

## 2021-07-08 DIAGNOSIS — I248 Other forms of acute ischemic heart disease: Secondary | ICD-10-CM | POA: Diagnosis present

## 2021-07-08 DIAGNOSIS — R0902 Hypoxemia: Secondary | ICD-10-CM | POA: Diagnosis not present

## 2021-07-08 DIAGNOSIS — Z6823 Body mass index (BMI) 23.0-23.9, adult: Secondary | ICD-10-CM

## 2021-07-08 DIAGNOSIS — R9431 Abnormal electrocardiogram [ECG] [EKG]: Secondary | ICD-10-CM | POA: Diagnosis present

## 2021-07-08 DIAGNOSIS — J8410 Pulmonary fibrosis, unspecified: Secondary | ICD-10-CM | POA: Diagnosis not present

## 2021-07-08 DIAGNOSIS — I480 Paroxysmal atrial fibrillation: Secondary | ICD-10-CM | POA: Diagnosis not present

## 2021-07-08 DIAGNOSIS — I4581 Long QT syndrome: Secondary | ICD-10-CM | POA: Diagnosis not present

## 2021-07-08 LAB — I-STAT ARTERIAL BLOOD GAS, ED
Acid-Base Excess: 9 mmol/L — ABNORMAL HIGH (ref 0.0–2.0)
Bicarbonate: 33.2 mmol/L — ABNORMAL HIGH (ref 20.0–28.0)
Calcium, Ion: 1.2 mmol/L (ref 1.15–1.40)
HCT: 43 % (ref 39.0–52.0)
Hemoglobin: 14.6 g/dL (ref 13.0–17.0)
O2 Saturation: 96 %
Potassium: 3.5 mmol/L (ref 3.5–5.1)
Sodium: 139 mmol/L (ref 135–145)
TCO2: 35 mmol/L — ABNORMAL HIGH (ref 22–32)
pCO2 arterial: 43.3 mmHg (ref 32–48)
pH, Arterial: 7.493 — ABNORMAL HIGH (ref 7.35–7.45)
pO2, Arterial: 77 mmHg — ABNORMAL LOW (ref 83–108)

## 2021-07-08 LAB — EXPECTORATED SPUTUM ASSESSMENT W GRAM STAIN, RFLX TO RESP C

## 2021-07-08 LAB — BLOOD GAS, ARTERIAL
Acid-Base Excess: 6.4 mmol/L — ABNORMAL HIGH (ref 0.0–2.0)
Bicarbonate: 30.5 mmol/L — ABNORMAL HIGH (ref 20.0–28.0)
O2 Saturation: 99.2 %
Patient temperature: 37.1
pCO2 arterial: 41 mmHg (ref 32–48)
pH, Arterial: 7.48 — ABNORMAL HIGH (ref 7.35–7.45)
pO2, Arterial: 83 mmHg (ref 83–108)

## 2021-07-08 LAB — BASIC METABOLIC PANEL
Anion gap: 9 (ref 5–15)
BUN: 21 mg/dL (ref 8–23)
CO2: 28 mmol/L (ref 22–32)
Calcium: 9 mg/dL (ref 8.9–10.3)
Chloride: 100 mmol/L (ref 98–111)
Creatinine, Ser: 1.25 mg/dL — ABNORMAL HIGH (ref 0.61–1.24)
GFR, Estimated: >60 mL/min (ref >60)
Glucose, Bld: 100 mg/dL — ABNORMAL HIGH (ref 70–99)
Potassium: 3.5 mmol/L (ref 3.5–5.1)
Sodium: 137 mmol/L (ref 135–145)

## 2021-07-08 LAB — RESP PANEL BY RT-PCR (FLU A&B, COVID) ARPGX2
Influenza A by PCR: NEGATIVE
Influenza B by PCR: NEGATIVE
SARS Coronavirus 2 by RT PCR: NEGATIVE

## 2021-07-08 LAB — CBC
HCT: 39 % (ref 39.0–52.0)
Hemoglobin: 13 g/dL (ref 13.0–17.0)
MCH: 33.1 pg (ref 26.0–34.0)
MCHC: 33.3 g/dL (ref 30.0–36.0)
MCV: 99.2 fL (ref 80.0–100.0)
Platelets: 233 10*3/uL (ref 150–400)
RBC: 3.93 MIL/uL — ABNORMAL LOW (ref 4.22–5.81)
RDW: 15.4 % (ref 11.5–15.5)
WBC: 14.9 10*3/uL — ABNORMAL HIGH (ref 4.0–10.5)
nRBC: 0 % (ref 0.0–0.2)

## 2021-07-08 LAB — TROPONIN I (HIGH SENSITIVITY)
Troponin I (High Sensitivity): 149 ng/L (ref <18)
Troponin I (High Sensitivity): 128 ng/L (ref <18)

## 2021-07-08 LAB — BRAIN NATRIURETIC PEPTIDE: B Natriuretic Peptide: 1048.1 pg/mL — ABNORMAL HIGH (ref 0.0–100.0)

## 2021-07-08 LAB — MAGNESIUM: Magnesium: 1.7 mg/dL (ref 1.7–2.4)

## 2021-07-08 LAB — MRSA NEXT GEN BY PCR, NASAL: MRSA by PCR Next Gen: NOT DETECTED

## 2021-07-08 MED ORDER — APIXABAN 5 MG PO TABS
5.0000 mg | ORAL_TABLET | Freq: Two times a day (BID) | ORAL | Status: DC
  Administered 2021-07-08 – 2021-07-24 (×33): 5 mg via ORAL
  Filled 2021-07-08 (×33): qty 1

## 2021-07-08 MED ORDER — LEVALBUTEROL HCL 0.63 MG/3ML IN NEBU
0.6300 mg | INHALATION_SOLUTION | Freq: Four times a day (QID) | RESPIRATORY_TRACT | Status: DC | PRN
  Administered 2021-07-21: 0.63 mg via RESPIRATORY_TRACT
  Filled 2021-07-08: qty 3

## 2021-07-08 MED ORDER — SODIUM CHLORIDE 0.9 % IV SOLN: INTRAVENOUS | Status: DC

## 2021-07-08 MED ORDER — MIRTAZAPINE 30 MG PO TABS
30.0000 mg | ORAL_TABLET | Freq: Every day | ORAL | Status: DC
  Filled 2021-07-08: qty 1

## 2021-07-08 MED ORDER — DILTIAZEM HCL ER COATED BEADS 120 MG PO CP24
120.0000 mg | ORAL_CAPSULE | Freq: Every morning | ORAL | Status: DC
Start: 2021-07-08 — End: 2021-07-24
  Administered 2021-07-08 – 2021-07-24 (×17): 120 mg via ORAL
  Filled 2021-07-08 (×18): qty 1

## 2021-07-08 MED ORDER — SODIUM CHLORIDE 0.9 % IV SOLN
2.0000 g | Freq: Once | INTRAVENOUS | Status: AC
  Administered 2021-07-08: 2 g via INTRAVENOUS
  Filled 2021-07-08: qty 12.5

## 2021-07-08 MED ORDER — DOCUSATE SODIUM 100 MG PO CAPS
100.0000 mg | ORAL_CAPSULE | Freq: Two times a day (BID) | ORAL | Status: DC
  Administered 2021-07-08 – 2021-07-23 (×19): 100 mg via ORAL
  Filled 2021-07-08 (×22): qty 1

## 2021-07-08 MED ORDER — PANTOPRAZOLE SODIUM 40 MG PO TBEC
40.0000 mg | DELAYED_RELEASE_TABLET | Freq: Every day | ORAL | Status: DC
  Administered 2021-07-08 – 2021-07-23 (×16): 40 mg via ORAL
  Filled 2021-07-08 (×16): qty 1

## 2021-07-08 MED ORDER — ONDANSETRON HCL 4 MG/2ML IJ SOLN: 4.0000 mg | Freq: Four times a day (QID) | INTRAMUSCULAR | Status: DC | PRN

## 2021-07-08 MED ORDER — BISACODYL 5 MG PO TBEC: 5.0000 mg | DELAYED_RELEASE_TABLET | Freq: Every day | ORAL | Status: DC | PRN

## 2021-07-08 MED ORDER — CLONAZEPAM 0.5 MG PO TABS
1.0000 mg | ORAL_TABLET | Freq: Two times a day (BID) | ORAL | Status: DC
  Administered 2021-07-08 – 2021-07-24 (×33): 1 mg via ORAL
  Filled 2021-07-08 (×34): qty 2

## 2021-07-08 MED ORDER — PSYLLIUM 95 % PO PACK
1.0000 | PACK | Freq: Every day | ORAL | Status: DC
  Administered 2021-07-08 – 2021-07-21 (×3): 1 via ORAL
  Filled 2021-07-08 (×10): qty 1

## 2021-07-08 MED ORDER — GUAIFENESIN ER 600 MG PO TB12
600.0000 mg | ORAL_TABLET | Freq: Two times a day (BID) | ORAL | Status: DC | PRN
  Administered 2021-07-09: 600 mg via ORAL
  Filled 2021-07-08: qty 1

## 2021-07-08 MED ORDER — VANCOMYCIN HCL 1250 MG/250ML IV SOLN
1250.0000 mg | INTRAVENOUS | Status: DC
  Filled 2021-07-08: qty 250

## 2021-07-08 MED ORDER — METHYLPREDNISOLONE SODIUM SUCC 125 MG IJ SOLR
80.0000 mg | Freq: Two times a day (BID) | INTRAMUSCULAR | Status: DC
Start: 2021-07-08 — End: 2021-07-11
  Administered 2021-07-08 – 2021-07-11 (×7): 80 mg via INTRAVENOUS
  Filled 2021-07-08 (×7): qty 2

## 2021-07-08 MED ORDER — MORPHINE SULFATE (PF) 2 MG/ML IV SOLN: 2.0000 mg | INTRAVENOUS | Status: DC | PRN

## 2021-07-08 MED ORDER — VANCOMYCIN HCL 1750 MG/350ML IV SOLN
1750.0000 mg | Freq: Once | INTRAVENOUS | Status: AC
  Administered 2021-07-08: 1750 mg via INTRAVENOUS
  Filled 2021-07-08: qty 350

## 2021-07-08 MED ORDER — HYDRALAZINE HCL 20 MG/ML IJ SOLN: 5.0000 mg | INTRAMUSCULAR | Status: DC | PRN

## 2021-07-08 MED ORDER — SODIUM CHLORIDE 0.9 % IV SOLN
2.0000 g | Freq: Two times a day (BID) | INTRAVENOUS | Status: DC
  Administered 2021-07-08: 2 g via INTRAVENOUS
  Filled 2021-07-08 (×3): qty 12.5

## 2021-07-08 MED ORDER — POLYETHYLENE GLYCOL 3350 17 G PO PACK: 17.0000 g | PACK | Freq: Every day | ORAL | Status: DC | PRN

## 2021-07-08 MED ORDER — ONDANSETRON HCL 4 MG PO TABS: 4.0000 mg | ORAL_TABLET | Freq: Four times a day (QID) | ORAL | Status: DC | PRN

## 2021-07-08 MED ORDER — NINTEDANIB ESYLATE 150 MG PO CAPS
150.0000 mg | ORAL_CAPSULE | Freq: Two times a day (BID) | ORAL | Status: DC
  Administered 2021-07-09 – 2021-07-17 (×17): 150 mg via ORAL
  Filled 2021-07-08 (×20): qty 1

## 2021-07-08 MED ORDER — NON FORMULARY: 150.0000 mg | Freq: Two times a day (BID) | Status: DC

## 2021-07-08 MED ORDER — FUROSEMIDE 10 MG/ML IJ SOLN
40.0000 mg | Freq: Once | INTRAMUSCULAR | Status: AC
Start: 2021-07-08 — End: 2021-07-08
  Administered 2021-07-08: 40 mg via INTRAVENOUS
  Filled 2021-07-08: qty 4

## 2021-07-08 MED ORDER — ACETAMINOPHEN 650 MG RE SUPP: 650.0000 mg | Freq: Four times a day (QID) | RECTAL | Status: DC | PRN

## 2021-07-08 MED ORDER — ROSUVASTATIN CALCIUM 20 MG PO TABS
20.0000 mg | ORAL_TABLET | Freq: Every day | ORAL | Status: DC
  Administered 2021-07-09 – 2021-07-21 (×13): 20 mg via ORAL
  Filled 2021-07-08 (×12): qty 1

## 2021-07-08 MED ORDER — SODIUM CHLORIDE 0.9% FLUSH
3.0000 mL | Freq: Two times a day (BID) | INTRAVENOUS | Status: DC
  Administered 2021-07-08 – 2021-07-24 (×31): 3 mL via INTRAVENOUS

## 2021-07-08 MED ORDER — ACETAMINOPHEN 325 MG PO TABS
650.0000 mg | ORAL_TABLET | Freq: Four times a day (QID) | ORAL | Status: DC | PRN
  Administered 2021-07-08 – 2021-07-23 (×14): 650 mg via ORAL
  Filled 2021-07-08 (×13): qty 2

## 2021-07-08 MED ORDER — HYDROCODONE-ACETAMINOPHEN 5-325 MG PO TABS
1.0000 | ORAL_TABLET | ORAL | Status: DC | PRN
  Administered 2021-07-20: 1 via ORAL
  Administered 2021-07-21: 2 via ORAL
  Filled 2021-07-08: qty 1
  Filled 2021-07-08: qty 2

## 2021-07-08 MED ORDER — GABAPENTIN 300 MG PO CAPS
600.0000 mg | ORAL_CAPSULE | Freq: Two times a day (BID) | ORAL | Status: DC
  Administered 2021-07-08 – 2021-07-23 (×32): 600 mg via ORAL
  Filled 2021-07-08 (×32): qty 2

## 2021-07-08 NOTE — ED Triage Notes (Signed)
Per EMS, pt from home, c/o SOB (had not been feeling well since Saturday.  Fire found him at 66%, placed on non-rebreather which increased to 95%.  Home O2 2-8L.  No CP.   ? ?144/100 ?92p ?97.7temp ?

## 2021-07-08 NOTE — Progress Notes (Addendum)
Pharmacy Antibiotic Note ? ?[name] is a 71 y.o. male admitted on 07/03/2021 with pneumonia.  Pharmacy has been consulted for vancomycin dosing. ? ?WBC 14.9, afebrile, SCr 1.25 ? ?Plan: ?Vancomycin '1750mg'$  IV x 1, followed by  ?Vancomycin '1250mg'$  IV q24h ([id number]) ?Goal AUC 400-550 ?Obtain MRSA PCR  ?Monitor WBC, SCr, temp, and clinical s/sx of infection ?F/u cultures and de-escalate as able  ?  ? ?Temp (24hrs), Avg:98.3 ?F (36.8 ?C), Min:98.3 ?F (36.8 ?C), Max:98.3 ?F (36.8 ?C) ? ?Recent Labs  ?Lab 07/05/2021 ?0703  ?WBC 14.9*  ?  ?Estimated Creatinine Clearance: 46.4 mL/min (by C-G formula based on SCr of 1.46 mg/dL).   ? ?Allergies  ?Allergen Reactions  ? Aleve [Naproxen Sodium] Anaphylaxis, Nausea Only and Other (See Comments)  ?  GI UPSET- Patient does not remember "anaphylaxis" in 2023  ? Demerol [Meperidine] Rash  ?   ?  ? Hyzaar [Losartan Potassium-Hctz] Other (See Comments)  ?  "Felt badly" ?  ? Lisinopril Cough  ? ? ?Antimicrobials this admission: ?Cefepime 5/15 >>  ?Vancomycin  5/15 >>  ? ?Dose adjustments this admission: ?N/A ? ?Microbiology results: ?5/15 MRSA PCR: to be collected ? ?Thank you for allowing pharmacy to be a part of this patient?s care. ? ?Kaleen Mask ?07/15/2021 8:20 AM ? ?Addendum:  ?Pharmacy consulted to dose cefepime for HCAP. CrCl ~54 ml/min. Patient received one dose in ED at 0815. Will start cefepime 2gm IV q12h starting tonight at ~2000.  ? ?Elody Kleinsasser A. Levada Dy, PharmD, BCPS, FNKF ?Clinical Pharmacist ?[place] ?Please utilize Amion for appropriate phone number to reach the unit pharmacist ([place]) ? ? ?

## 2021-07-08 NOTE — Evaluation (Signed)
Clinical/Bedside Swallow Evaluation ?Patient Details  ?Name: Eduardo Fuentes ?MRN: [id number] ?Date of Birth: October 26, 1950 ? ?Today's Date: 07/09/2021 ?Time: SLP Start Time (ACUTE ONLY): 1150 SLP Stop Time (ACUTE ONLY): 9371 ?SLP Time Calculation (min) (ACUTE ONLY): 24 min ? ?Past Medical History:  ?Past Medical History:  ?Diagnosis Date  ? Agatston CAC score, <100 04/2018  ? Arthritis   ? NECK  ? Cough   ? NON-PRODUCTIVE  ? Depressive disorder 10/2011  ? follow by dr Toy Care psychiatry; has anxiety and panic disorder as well.  ? Diverticulosis   ? Diverticulosis of colon   ? Followed by Dr. Henrene Pastor  ? Essential hypertension   ? Generalized anxiety disorder   ? GERD (gastroesophageal reflux disease)   ? WATCHES DIET  ? Hemorrhoid   ? Hiatal hernia   ? Describes a sliding hiatal hernia  ? History of colon polyps   ? History of kidney stones   ? Idiopathic pulmonary fibrosis ([place])   ? Microscopic hematuria   ? Nocturia   ? Panic attacks   ? Prostate cancer ([place]) 07/2012  ? Dr. Arnell Sieving urology.  ? Sinus infection   ? Tinnitus   ? LEFT EAR -- CHRONIC  ? ?Past Surgical History:  ?Past Surgical History:  ?Procedure Laterality Date  ? COLONOSCOPY  03/04/2013  ? ESOPHAGOGASTRODUODENOSCOPY    ? EXTRACORPOREAL SHOCK WAVE LITHOTRIPSY  YRS AGO  ? PROSTATE BIOPSY    ? RADIOACTIVE SEED IMPLANT N/A 02/02/2013  ? Procedure: RADIOACTIVE SEED IMPLANT;  Surgeon: Molli Hazard, MD;  Location: [facility];  Service: Urology;  Laterality: N/A;  ? RIGHT/LEFT HEART CATH AND CORONARY ANGIOGRAPHY N/A 05/30/2021  ? Procedure: RIGHT/LEFT HEART CATH AND CORONARY ANGIOGRAPHY;  Surgeon: Jettie Booze, MD;  Location: [facility];  Service: Cardiovascular;  Laterality: N/A;  ? TONSILLECTOMY AND ADENOIDECTOMY  AS CHILD  ? TRANSTHORACIC ECHOCARDIOGRAM  06/22/2020  ? LVEF 60-65%. No RWMA. Moderate Basal0-Septal Asymmetric LVH. Gr1 DD. Normal RV size & Fxn with normal RVP/RAP.  Mild AoV Sclerosis w/o Stensosis. Ao  Root 40 mm (mild dilation)  ? UPPER GASTROINTESTINAL ENDOSCOPY  2019  ? ?HPI:  ?Pt is a 71 yo male recently admitted for IPF exacerbation and elevated troponins, now readmitted wtih worsening dyspnea and hypoxia to the 50s. CXR showed new R lung infiltrates. Admitted with acute hypoxic respiratory failure likely secondary to PNA vs IPF exacerbation. PMH includes: difficulty swallowing due to esophageal strictures, GERD, HH, idiopathic pulmonary fibrosis, arthritis (neck), non-productive cough, generalized anxiety disorder, panic attacks  ?  ?Assessment / Plan / Recommendation  ?Clinical Impression ? Pt has some signs of potential dysphagia, including consistent throat clearing with thin liquids and ice chips as well as intermittent eructation. RN suggested change to [place] for eval but pt was kept on NRB as his RR was elevated into the 30s (SpO2 ~91%). He has a h/o esophageal and respiratory issues that could both contribute to difficulty swallowing. Pt says he has had episodes in the past after eating, in which he has coughing fits until he ultimately regurgitates his meal. This happened most recently about a month ago after eating lasagna (with red sauce). Suspect that he may have a primary esophageal dysphagia given known hx, but cannot rule out the potential for decreased airway protection. Given his intermittent need for BiPAP and overall compromised respiratory status, he would be at an increased risk at the moment for dysphagia related adverse events. Would maintain NPO status except for meds,  but would offer them crushed in puree as able. Could offer a small sip of water or ice chip after oral care if respiratory status allows. Will f/u to see if signs of dysphagia persist after breathing improves. ?SLP Visit Diagnosis: Dysphagia, unspecified (R13.10) ?   ?Aspiration Risk ? Moderate aspiration risk  ?  ?Diet Recommendation NPO except meds (could have a sip of water or ice chip after oral care)  ? ?Medication  Administration: Crushed with puree  ?  ?Other  Recommendations Oral Care Recommendations: Oral care QID   ? ?Recommendations for follow up therapy are one component of a multi-disciplinary discharge planning process, led by the attending physician.  Recommendations may be updated based on patient status, additional functional criteria and insurance authorization. ? ?Follow up Recommendations  (tba)  ? ? ?  ?Assistance Recommended at Discharge PRN  ?Functional Status Assessment Patient has had a recent decline in their functional status and demonstrates the ability to make significant improvements in function in a reasonable and predictable amount of time.  ?Frequency and Duration min 2x/week  ?2 weeks ?  ?   ? ?Prognosis Prognosis for Safe Diet Advancement: Good  ? ?  ? ?Swallow Study   ?General HPI: Pt is a 71 yo male recently admitted for IPF exacerbation and elevated troponins, now readmitted wtih worsening dyspnea and hypoxia to the 50s. CXR showed new R lung infiltrates. Admitted with acute hypoxic respiratory failure likely secondary to PNA vs IPF exacerbation. PMH includes: difficulty swallowing due to esophageal strictures, GERD, HH, idiopathic pulmonary fibrosis, arthritis (neck), non-productive cough, generalized anxiety disorder, panic attacks ?Type of Study: Bedside Swallow Evaluation ?Previous Swallow Assessment: none in chart ?Diet Prior to this Study: NPO ?Temperature Spikes Noted: No ?Respiratory Status: Non-rebreather ?History of Recent Intubation: No ?Behavior/Cognition: Alert;Cooperative;Pleasant mood ?Oral Cavity Assessment: Within Functional Limits ?Oral Care Completed by SLP: No ?Oral Cavity - Dentition: Adequate natural dentition ?Vision: Functional for self-feeding ?Patient Positioning: Upright in bed ?Baseline Vocal Quality: Normal ?Volitional Cough: Strong ?Volitional Swallow: Able to elicit  ?  ?Oral/Motor/Sensory Function Overall Oral Motor/Sensory Function: Within functional limits    ?Ice Chips Ice chips: Impaired ?Presentation: Spoon ?Pharyngeal Phase Impairments: Throat Clearing - Immediate   ?Thin Liquid Thin Liquid: Impaired ?Presentation: Straw ?Pharyngeal  Phase Impairments: Throat Clearing - Immediate  ?  ?Nectar Thick Nectar Thick Liquid: Not tested   ?Honey Thick Honey Thick Liquid: Not tested   ?Puree Puree: Within functional limits ?Presentation: Spoon   ?Solid ? ? ?  Solid: Not tested  ? ?  ? ?Osie Bond., M.A. CCC-SLP ?Acute Rehabilitation Services ?Office [phone] ? ?Secure chat preferred ? ?07/15/2021,4:12 PM ? ? ? ?

## 2021-07-08 NOTE — Progress Notes (Signed)
Sputum container left on bedside table in reach. Family at bedside. Pt unable to give specimen at this time. Told to call when able to give sputum sample.  ?

## 2021-07-08 NOTE — ED Provider Notes (Signed)
?[facility] ?Provider Note ? ? ?CSN: [id number] ?Arrival date & time: 07/19/2021  5277 ? ?  ? ?History ?Chief Complaint  ?Patient presents with  ? Shortness of Breath  ? ? ?Eduardo Fuentes is a 71 y.o. male with past medical history of IPF and PAF on Eliquis who presents to the ED for shortness of breath, oxygen saturation was found to be at 60% on arrival.  ? ?He woke up Friday evening (5/12) with increased shortness of breath. He states that he uses 3L [place] at rest at home at 8L when he is moving. Even with using 8L he was having to stop multiple times to walk across the living room. He endorses a non-productive cough over the last several days and chills over the last week. He was last seen by pulmonology on 5/12, recent FVC decreased by 12%.    ? ?The history is provided by the patient.  ?Shortness of Breath ?Associated symptoms: cough   ?Associated symptoms: no chest pain and no vomiting   ? ?  ? ?Home Medications ?Prior to Admission medications   ?Medication Sig Start Date End Date Taking? Authorizing Provider  ?apixaban (ELIQUIS) 5 MG TABS tablet Take 1 tablet (5 mg total) by mouth 2 (two) times daily. 04/25/21   Fenton, Clint R, PA  ?benzonatate (TESSALON) 200 MG capsule Take 1 capsule (200 mg total) by mouth in the morning, at noon, and at bedtime. 06/21/21   Brand Males, MD  ?clonazePAM (KLONOPIN) 1 MG tablet Take 1 tablet at 8 am, 1/2 tablet at 2 pm and 1 tablet at bedtime for anxiety. ?Patient taking differently: Take 0.5-1 mg by mouth See admin instructions. Take 1 mg by mouth in the morning and at bedtime- may take an additional 0.5-1 mg during the day as needed for anxiety 11/05/11   Readling, Milana Huntsman, MD  ?diltiazem (CARDIZEM CD) 120 MG 24 hr capsule TAKE 1 CAPSULE(120 MG) BY MOUTH DAILY ?Patient taking differently: Take 120 mg by mouth in the morning. 02/20/21   Fenton, Clint R, PA  ?dimenhyDRINATE (DRAMAMINE) 50 MG tablet Take 50 mg by mouth every 8 (eight) hours  as needed for nausea (or vomiting- when not taking Zofran).    [provider]  ?gabapentin (NEURONTIN) 300 MG capsule Take 600 mg by mouth in the morning and at bedtime.    [provider]  ?mirtazapine (REMERON) 30 MG tablet Take 30 mg by mouth at bedtime.  12/22/17   [provider]  ?Nintedanib (OFEV) 150 MG CAPS TAKE 1 CAPSULE (150 MG TOTAL) BY MOUTH 2 (TWO) TIMES DAILY. ?Patient taking differently: Take 150 mg by mouth in the morning and at bedtime. 03/01/21   Brand Males, MD  ?OXYGEN Inhale 2-8 L/min into the lungs See admin instructions. 2 L/min of oxygen when at rest and 8 L/min during any exertion continuously    [provider]  ?pantoprazole (PROTONIX) 40 MG tablet TAKE 1 TABLET(40 MG) BY MOUTH DAILY ?Patient taking differently: Take 40 mg by mouth at bedtime. 03/04/21   Irene Shipper, MD  ?psyllium (METAMUCIL) 58.6 % powder Take 1 packet by mouth at bedtime.    [provider]  ?rosuvastatin (CRESTOR) 20 MG tablet Take 1 tablet (20 mg total) by mouth daily. 08/29/20 05/29/21  Warren Lacy, PA-C  ?TYLENOL 500 MG tablet Take 1,000 mg by mouth every 6 (six) hours as needed for mild pain or headache.    [provider]  ?   ? ?  Allergies    ?Aleve [naproxen sodium], Demerol [meperidine], Hyzaar [losartan potassium-hctz], and Lisinopril   ? ?Review of Systems   ?Review of Systems  ?Constitutional:  Positive for appetite change and chills.  ?HENT:  Negative for congestion, postnasal drip and rhinorrhea.   ?Respiratory:  Positive for cough and shortness of breath.   ?Cardiovascular:  Negative for chest pain.  ?Gastrointestinal:  Positive for nausea. Negative for vomiting.  ? ?Physical Exam ?Updated Vital Signs ?BP 122/86   Pulse 91   Temp 98.3 ?F (36.8 ?C) (Oral)   Resp (!) 25   SpO2 98%  ?Physical Exam ?Constitutional: alert, sitting up in bed, answers questions appropriately ?Cardiovascular: regular rate and rhythm, no m/r/g ?Pulmonary/Chest:  bilateral crackles, tachypnea, non-rebreather in place ?Abdominal: soft, non-tender, non-distended ?Skin: warm and dry ?MSK: no lower extremity edema ? ?ED Results / Procedures / Treatments   ?Labs ?(all labs ordered are listed, but only abnormal results are displayed) ?Labs Reviewed  ?BASIC METABOLIC PANEL - Abnormal; Notable for the following components:  ?    Result Value  ? Glucose, Bld 100 (*)   ? Creatinine, Ser 1.25 (*)   ? All other components within normal limits  ?CBC - Abnormal; Notable for the following components:  ? WBC 14.9 (*)   ? RBC 3.93 (*)   ? All other components within normal limits  ?BRAIN NATRIURETIC PEPTIDE - Abnormal; Notable for the following components:  ? B Natriuretic Peptide 1,048.1 (*)   ? All other components within normal limits  ?I-STAT ARTERIAL BLOOD GAS, ED - Abnormal; Notable for the following components:  ? pH, Arterial 7.493 (*)   ? pO2, Arterial 77 (*)   ? Bicarbonate 33.2 (*)   ? TCO2 35 (*)   ? Acid-Base Excess 9.0 (*)   ? All other components within normal limits  ?TROPONIN I (HIGH SENSITIVITY) - Abnormal; Notable for the following components:  ? Troponin I (High Sensitivity) 149 (*)   ? All other components within normal limits  ?RESP PANEL BY RT-PCR (FLU A&B, COVID) [id number]  ?MRSA NEXT GEN BY PCR, NASAL  ?BLOOD GAS, ARTERIAL  ?TROPONIN I (HIGH SENSITIVITY)  ? ? ?EKG ?EKG Interpretation ? ?Date/Time:  Monday Jul 08 2021 06:58:23 EDT ?Ventricular Rate:  108 ?PR Interval:  31 ?QRS Duration: 86 ?QT Interval:  461 ?QTC Calculation: 613 ?R Axis:   85 ?Text Interpretation: Sinus tachycardia Biatrial enlargement Prolonged QT interval when compared to prior, more artifact. No STEMI Confirmed by Antony Blackbird [phone]) on 06/26/2021 7:18:37 AM ? ?Radiology ?DG CHEST PORT 1 VIEW ? ?Result Date: 06/30/2021 ?CLINICAL DATA:  Short of breath EXAM: PORTABLE CHEST 1 VIEW COMPARISON:  06/01/2021 FINDINGS: Background changes of fibrotic interstitial lung disease. Increased opacification of the  right lung. No significant pleural effusion. No pneumothorax. Similar cardiomediastinal contours. IMPRESSION: Increased opacification of the right lung may reflect pneumonia, asymmetric edema, or exacerbation of interstitial lung disease. Electronically Signed   By: Macy Mis M.D.   On: 07/19/2021 07:35   ? ?Medications Ordered in ED ?Medications  ?vancomycin (VANCOREADY) IVPB 1750 mg/350 mL (has no administration in time range)  ?vancomycin (VANCOREADY) IVPB 1250 mg/250 mL (has no administration in time range)  ?methylPREDNISolone sodium succinate (SOLU-MEDROL) 40 mg/mL injection 40 mg (has no administration in time range)  ?NON FORMULARY 150 mg (has no administration in time range)  ?ceFEPIme (MAXIPIME) 2 g in sodium chloride 0.9 % 100 mL IVPB (2 g Intravenous New Bag/Given 07/05/2021 0830)  ? ? ?ED Course/  Medical Decision Making/ A&P ?Clinical Course as of 07/07/2021 0912  ?Mon Jul 08, 2021  ?9983 Creatinine(!): 1.25 [KM]  ?  ?Clinical Course User Index ?[KM] Murrell Dome, Joellen Jersey, DO  ? ?                        ?Medical Decision Making ?Patient presents with 3 day history of shortness of breath. He was seen in [place] clinic on Friday and was at baseline at that time. He reports waking up overnight on Friday due to dyspnea. He has not been able to eat or drink over the last few days due to dyspnea and loss of appetite. Oxygen saturation improved with non-rebreather, however patient continues to have increased work of breathing. WBC elevated at 14.9, BNP elevated at 1048. CXR concerning for right lung pneumonia vs exacerbation of ILD. Presentation consistent with pneumonia. He also likely has some acute on chronic heart failure exacerbation as BNP is elevated and he recently stopped taking furosemide. Troponin elevated at 149, likely demand ischemia. Recent catheterization showed non-obstructive vs minimal CAD. ? ?Critical care consulted due to history of ILD and possible need for intubation. He was started on cefepime  and vancomycin. ? ?Amount and/or Complexity of Data Reviewed ?Independent Historian: EMS ?   Details: Oxygen saturation at 60% on EMS arrival. ?Labs: ordered. ?Radiology: ordered. ? ? ?Final Clinical Impressi

## 2021-07-08 NOTE — Consult Note (Addendum)
? ?NAME:  Eduardo Fuentes, MRN:  [id number], DOB:  08-Nov-1950, LOS: 0 ?ADMISSION DATE:  06/30/2021, CONSULTATION DATE:  07/18/2021 ?REFERRING MD:  Masters, CHIEF COMPLAINT:  Hypoxemia, IPF, Pneumonia, Heart Failure  ? ?History of Present Illness:  ?71 year old male, never smoker. PMH significant for coronary artery calcification, afib, IPF, prostate CA, hyperlipidemia, GERD with sliding hiatal hernia, GAD, panic disorder with agoraphobia. ?Maintenance of Ofev. Pt. Presents to the ED 07/12/2021 after 2 days of worsening dyspnea than his baseline. Quick onset, started Friday evening 5/12 and became progressively worse Saturday and Sunday .  ?He stated he had not increased his oxygen to 8 L as recommended due to the lacation of the concentrator in his home.  ?In the ED he arrived with sats in the 50's. They quickly rebounded with NRB to upper 90's. RR is still 20. CXR revealed increased opacification in the right lung suspicious for pneumonia. He has endorsed chills, but no fever, WBC was  14.9, BNP 1048.1, Initial ABG 7.493/ 43/77/33.2/TCO2 of 35, sat of 96%. Troponin of 149, which I suspect is demand ischemia. He states he has not been increasing his oxygen at home over the past 2 days, and endorses saturations that were well below 88% . He states he has not had significant chest pain. ?He had been see in the office 07/05/2021, oxygen use 2-8L [place]. Total dyspnea score at that time was 15. PFT's showed DLCO of 37%, which is significantly lower than his previous DLCO 9/22 which was 61%. He had been advised to stop his lasix as he did not look volume overloaded, and most recent BNP was 44 . He states he did eat a hamburger on the way home from his office appointment , does not endorse choking on his food. He started having worse than baseline dyspnea Friday evening, with continued decompensation Saturday and Sunday.  ?Pt has significantly improved on NRB , PCCM were asked to evaluate for admission. Patient states he does not  want intubation, but would agree to BiPAP as needed. Pt. Will be admitted by [facility] , Step Down unit, and PCCM will remain on as daily consult with ability to take over if he decompensates.  ? ?Pertinent  Medical History  ?  ?Past Medical History:  ?Diagnosis Date  ? Agatston CAC score, <100 04/2018  ? Arthritis   ? NECK  ? Cough   ? NON-PRODUCTIVE  ? Depressive disorder 10/2011  ? follow by dr Toy Care psychiatry; has anxiety and panic disorder as well.  ? Diverticulosis   ? Diverticulosis of colon   ? Followed by Dr. Henrene Pastor  ? Essential hypertension   ? Generalized anxiety disorder   ? GERD (gastroesophageal reflux disease)   ? WATCHES DIET  ? Hemorrhoid   ? Hiatal hernia   ? Describes a sliding hiatal hernia  ? History of colon polyps   ? History of kidney stones   ? Idiopathic pulmonary fibrosis ([place])   ? Microscopic hematuria   ? Nocturia   ? Panic attacks   ? Prostate cancer ([place]) 07/2012  ? Dr. Arnell Sieving urology.  ? Sinus infection   ? Tinnitus   ? LEFT EAR -- CHRONIC  ?  ? ?Significant Hospital Events: ?Including procedures, antibiotic start and stop dates in addition to other pertinent events   ?Admitted 05/29/2021 for IPF/ CHF flare ?Admission 07/14/2021 with Pneumonia, CHF  ?Vanc and Cefepime initiated 07/23/2021  ? ?Cardiac Cath 05/30/2021 ?The left ventricular systolic function is normal. ?  LV end diastolic pressure is normal. ?  The left ventricular ejection fraction is 50-55% by visual estimate. ?  Hemodynamic findings consistent with mild pulmonary hypertension. ?  There is no aortic valve stenosis. ?  Mild, diffuse nonobstructive coronary artery disease. ?  Aortic saturation 94%, PA saturation 65%, PA pressure 55/21, mean PA pressure 34 mmHg, pulmonary capillary wedge pressure 9 mmHg, cardiac output 4.75 L/min, cardiac index 2.5. ? ?Elevated troponin likely from demand ischemia.  Nonobstructive, minimal CAD.  Continue medical therapy. ?  ?  ? ?Interim History / Subjective:  ?Improved with  oxygen saturations of 98% on NRB ?Anxious, but alert and oriented.  ?Afebrile >> T Max of 98.3, WBC of 14.9, HGB 13 ?Creatinine of 1.25, Troponin of 149, K 3.5 ? ? ?Objective   ?Blood pressure (!) 129/94, pulse 95, temperature 98.3 ?F (36.8 ?C), temperature source Oral, resp. rate (!) 26, SpO2 99 %. ?   ?   ?No intake or output data in the 24 hours ending 07/16/2021 0746 ?There were no vitals filed for this visit. ? ?Examination: ?General: Awake and alert, elderly male wearing NRB  ?HENT: NCAT, No JVD No LAD ?Lungs:  Bilateral chest excursion , Crackles throughout bases, RR 20-26, Rhonchi upper and lower lobes R>L ?Cardiovascular: S1, S2, RRR, No RMG ?Abdomen:  Sodt, flat, NT, ND, BS + ?Extremities:  No obvious deformities, no edema ?Neuro:  A&O x 3, MAE x 4, Follows commands , appropriate ?GU:  Not assessed ? ?Resolved Hospital Problem list   ? ? ?Assessment & Plan:  ?HCAP vs Aspiration Pneumonia ?Acute on Chronic Respiratory Failure  ?Plan ?Vanc and cefepime per Primary team ?Trend fever curve and WBC ?CXR prn to trend for resolution  ?Titrate oxygen for sats > 88% ?ABG prn ?Prn Xopenex  ?Flutter valve/ IS  ?Ambulate/ Mobilize  as able ?BiPAP as needed for change in mental status, Increased CO2 on ABG, increased WOB >> Step down admission  ?NPO with meds until feel BiPAP will not be needed ? ?BNP of 1048 ?Troponin of 149>> suspect element of demand ischemia ?Plan ?Diuresis as renal function allows ?Trend Troponins for peak ?Trend BNP ?Consider Echo ( EF 60-65% in 03/2021 >> Cardiac Cath 4/23>> 50-55%)  ?Consider cardiac consult ?EKG prn  ?Optimize mag and K ( 2.0 and 4.0 respectively) , trend as needed ?Consider continuing maintenance dosing of lasix if renal function allows at dc ?Continue Eliquis  ? ?Progressive ILD ?Worsening DLCO per PFT ?Does not want intubation ( as of 07/14/2021)  ?Plan ?Continue OFEV ( Non-formulary drug, wife will need to bring medication from home)  ?Solumerdol 40 mg Q 6 hours ?Diuretic as  renal function allows ?Titrate oxygen for sats of > 88% at all times ? ?Creatinine of 1.25 on admission  ?Plan ?Trend BMET ?Strict I&O ?Replete electrolytes as needed  ? ?Anxiety/ Panic Disorder ?Plan ?Continue home medications , Klonopin and Remeron , gabapentin  as mental status allows ? ?Best Practice (right click and "Reselect all SmartList Selections" daily)  ? ?Diet/type: NPO w/ oral meds ?DVT prophylaxis: DOAC ?GI prophylaxis: PPI ?Lines: N/A ?Foley:  N/A ?Code Status:   Do Not intubate ?Last date of multidisciplinary goals of care discussion Spoke with patient 07/23/2021, he is a Do not intubate) ] ? ?Labs   ?CBC: ?Recent Labs  ?Lab 07/13/2021 ?0703 07/19/2021 ?0716  ?WBC 14.9*  --   ?HGB 13.0 14.6  ?HCT 39.0 43.0  ?MCV 99.2  --   ?PLT 233  --   ? ? ?  Basic Metabolic Panel: ?Recent Labs  ?Lab 07/18/2021 ?0716  ?NA 139  ?K 3.5  ? ?GFR: ?Estimated Creatinine Clearance: 46.4 mL/min (by C-G formula based on SCr of 1.46 mg/dL). ?Recent Labs  ?Lab 07/11/2021 ?0703  ?WBC 14.9*  ? ? ?Liver Function Tests: ?No results for input(s): AST, ALT, ALKPHOS, BILITOT, PROT, ALBUMIN in the last 168 hours. ?No results for input(s): LIPASE, AMYLASE in the last 168 hours. ?No results for input(s): AMMONIA in the last 168 hours. ? ?ABG ?   ?Component Value Date/Time  ? PHART 7.493 (H) 07/19/2021 0716  ? PCO2ART 43.3 06/26/2021 0716  ? PO2ART 77 (L) 06/29/2021 0716  ? HCO3 33.2 (H) 07/14/2021 0716  ? TCO2 35 (H) 07/15/2021 0716  ? O2SAT 96 07/20/2021 0716  ?  ? ?Coagulation Profile: ?No results for input(s): INR, PROTIME in the last 168 hours. ? ?Cardiac Enzymes: ?No results for input(s): CKTOTAL, CKMB, CKMBINDEX, TROPONINI in the last 168 hours. ? ?HbA1C: ?No results found for: HGBA1C ? ?CBG: ?No results for input(s): GLUCAP in the last 168 hours. ? ?Review of Systems:   ?Worsening than baseline dyspnea/ hypoxemia ?Chills, fatigue, chest heaviness, non-productive cough, but very few clear secretions  ? ?Past Medical History:  ?He,  has a  past medical history of Agatston CAC score, <100 (04/2018), Arthritis, Cough, Depressive disorder (10/2011), Diverticulosis, Diverticulosis of colon, Essential hypertension, Generalized anxiety disorder,

## 2021-07-08 NOTE — Progress Notes (Signed)
Pt stated he does not want to be a DNR at this time. States he just got to the floor and that the doctor rushed him into making that kind of decision. He wants to talk with the doctor again and more information about his condition and treatment. He states he wanted to wait at least a few days before making that kind of decision.  ?Notified attending MD. ?

## 2021-07-08 NOTE — H&P (Signed)
?History and Physical  ? ? ?Patient: Eduardo Fuentes IZT:[id number] DOB: Nov 26, 1950 ?DOA: 07/15/2021 ?DOS: the patient was seen and examined on 07/04/2021 ?PCP: Lujean Amel, MD  ?Patient coming from: Home - lives with wife; NOK: Wife, Rajiv Parlato, [phone] ? ? ?Chief Complaint: SOB ? ?HPI: Eduardo Fuentes is a 71 y.o. male with medical history significant of HTN; depression/anxiety; pulmonary fibrosis on 8L; and prostate CA presenting with SOB. He reports that he has had IPF for a number of years but was only started on O2 about 2-3 months ago.  He has been on 2-3L but when seen by pulm Friday he was increased to 2-3L at rest and 8L with exertion.  However, he has been too winded to effectively get up to the bathroom.  Chronic cough, 80% nonproductive and this is unchanged.  No fevers.  He became acutely more SOB last night and EMS was called; O2 sat was 62% despite [place] O2.  He denies recent aspiration but does cough/sputter on secretions without eating. ? ? ? ?ER Course:  SOB, O2 60% with fire.  Placed on NRB -> 80-90s.  No intubation.  PCCM consulted, will follow.  PNA, on CXR given Cefepime, Vanc.  Critical care is concerned about aspiration PNA after choking event.  Still with tachypnea, may need BIPAP. ? ? ? ? ?Review of Systems: As mentioned in the history of present illness. All other systems reviewed and are negative. ?Past Medical History:  ?Diagnosis Date  ? Agatston CAC score, <100 04/2018  ? Arthritis   ? NECK  ? Cough   ? NON-PRODUCTIVE  ? Depressive disorder 10/2011  ? follow by dr [facility] psychiatry; has anxiety and panic disorder as well.  ? Diverticulosis   ? Diverticulosis of colon   ? Followed by Dr. Henrene Pastor  ? Essential hypertension   ? Generalized anxiety disorder   ? GERD (gastroesophageal reflux disease)   ? WATCHES DIET  ? Hemorrhoid   ? Hiatal hernia   ? Describes a sliding hiatal hernia  ? History of colon polyps   ? History of kidney stones   ? Idiopathic pulmonary fibrosis ([place])   ?  Microscopic hematuria   ? Nocturia   ? Panic attacks   ? Prostate cancer ([place]) 07/2012  ? Dr. Arnell Sieving urology.  ? Sinus infection   ? Tinnitus   ? LEFT EAR -- CHRONIC  ? ?Past Surgical History:  ?Procedure Laterality Date  ? COLONOSCOPY  03/04/2013  ? ESOPHAGOGASTRODUODENOSCOPY    ? EXTRACORPOREAL SHOCK WAVE LITHOTRIPSY  YRS AGO  ? PROSTATE BIOPSY    ? RADIOACTIVE SEED IMPLANT N/A 02/02/2013  ? Procedure: RADIOACTIVE SEED IMPLANT;  Surgeon: Molli Hazard, MD;  Location: [facility];  Service: Urology;  Laterality: N/A;  ? RIGHT/LEFT HEART CATH AND CORONARY ANGIOGRAPHY N/A 05/30/2021  ? Procedure: RIGHT/LEFT HEART CATH AND CORONARY ANGIOGRAPHY;  Surgeon: Jettie Booze, MD;  Location: [facility];  Service: Cardiovascular;  Laterality: N/A;  ? TONSILLECTOMY AND ADENOIDECTOMY  AS CHILD  ? TRANSTHORACIC ECHOCARDIOGRAM  06/22/2020  ? LVEF 60-65%. No RWMA. Moderate Basal0-Septal Asymmetric LVH. Gr1 DD. Normal RV size & Fxn with normal RVP/RAP.  Mild AoV Sclerosis w/o Stensosis. Ao Root 40 mm (mild dilation)  ? UPPER GASTROINTESTINAL ENDOSCOPY  2019  ? ?Social History:  reports that he has never smoked. He has never used smokeless tobacco. He reports that he does not drink alcohol and does not use drugs. ? ?Allergies  ?Allergen Reactions  ?  Aleve [Naproxen Sodium] Anaphylaxis, Nausea Only and Other (See Comments)  ?  GI UPSET- Patient does not remember "anaphylaxis" in 2023  ? Demerol [Meperidine] Rash  ?   ?  ? Hyzaar [Losartan Potassium-Hctz] Other (See Comments)  ?  "Felt badly" ?  ? Lisinopril Cough  ? ? ?Family History  ?Problem Relation Age of Onset  ? Dementia Mother   ? Anxiety disorder Mother   ? Alzheimer's disease Mother   ? Bone cancer Father   ?     Metastatic prostate  ? Cancer Father   ?     prostate  ? Heart attack Father 44  ?     Referred for CABG  ? Anxiety disorder Sister   ? Colon cancer Neg Hx   ? Esophageal cancer Neg Hx   ? Rectal cancer Neg Hx   ?  Stomach cancer Neg Hx   ? Colon polyps Neg Hx   ? ? ?Prior to Admission medications   ?Medication Sig Start Date End Date Taking? Authorizing Provider  ?apixaban (ELIQUIS) 5 MG TABS tablet Take 1 tablet (5 mg total) by mouth 2 (two) times daily. 04/25/21   Fenton, Clint R, PA  ?benzonatate (TESSALON) 200 MG capsule Take 1 capsule (200 mg total) by mouth in the morning, at noon, and at bedtime. 06/21/21   Brand Males, MD  ?clonazePAM (KLONOPIN) 1 MG tablet Take 1 tablet at 8 am, 1/2 tablet at 2 pm and 1 tablet at bedtime for anxiety. ?Patient taking differently: Take 0.5-1 mg by mouth See admin instructions. Take 1 mg by mouth in the morning and at bedtime- may take an additional 0.5-1 mg during the day as needed for anxiety 11/05/11   Readling, Milana Huntsman, MD  ?diltiazem (CARDIZEM CD) 120 MG 24 hr capsule TAKE 1 CAPSULE(120 MG) BY MOUTH DAILY ?Patient taking differently: Take 120 mg by mouth in the morning. 02/20/21   Fenton, Clint R, PA  ?dimenhyDRINATE (DRAMAMINE) 50 MG tablet Take 50 mg by mouth every 8 (eight) hours as needed for nausea (or vomiting- when not taking Zofran).    [provider]  ?gabapentin (NEURONTIN) 300 MG capsule Take 600 mg by mouth in the morning and at bedtime.    [provider]  ?mirtazapine (REMERON) 30 MG tablet Take 30 mg by mouth at bedtime.  12/22/17   [provider]  ?Nintedanib (OFEV) 150 MG CAPS TAKE 1 CAPSULE (150 MG TOTAL) BY MOUTH 2 (TWO) TIMES DAILY. ?Patient taking differently: Take 150 mg by mouth in the morning and at bedtime. 03/01/21   Brand Males, MD  ?OXYGEN Inhale 2-8 L/min into the lungs See admin instructions. 2 L/min of oxygen when at rest and 8 L/min during any exertion continuously    [provider]  ?pantoprazole (PROTONIX) 40 MG tablet TAKE 1 TABLET(40 MG) BY MOUTH DAILY ?Patient taking differently: Take 40 mg by mouth at bedtime. 03/04/21   Irene Shipper, MD  ?psyllium (METAMUCIL) 58.6 % powder Take 1 packet by mouth at  bedtime.    [provider]  ?rosuvastatin (CRESTOR) 20 MG tablet Take 1 tablet (20 mg total) by mouth daily. 08/29/20 05/29/21  Warren Lacy, PA-C  ?TYLENOL 500 MG tablet Take 1,000 mg by mouth every 6 (six) hours as needed for mild pain or headache.    [provider]  ? ? ?Physical Exam: ?Vitals:  ? 07/05/2021 0730 07/03/2021 0815 06/30/2021 1104 07/01/2021 1644  ?BP: (!) 132/97 122/86 (!) 143/92 (!) 132/100  ?  Pulse: 96 91 95 99  ?Resp: 19 (!) 25 20 (!) 25  ?Temp:   98.5 ?F (36.9 ?C) 98.5 ?F (36.9 ?C)  ?TempSrc:   Oral Oral  ?SpO2: 95% 98% 93% 90%  ?Weight:   71.1 kg   ?Height:   '5\' 9"'$  (1.753 m)   ? ?General:  Appears ill, with significant tachypnea with conversation despite NRB O2 ?Eyes:  EOMI, normal lids, iris ?ENT:  grossly normal hearing, lips & tongue, mmm ?Neck:  no LAD, masses or thyromegaly ?Cardiovascular:  RRR, no m/r/g. No LE edema.  ?Respiratory:   Scattered rhonchi.  Increased respiratory effort at rest that is significant with conversation. ?Abdomen:  soft, NT, ND ?Skin:  no rash or induration seen on limited exam ?Musculoskeletal:  grossly normal tone BUE/BLE, good ROM, no bony abnormality ?Psychiatric:  anxious mood and affect, speech fluent and appropriate, AOx3 ?Neurologic:  CN 2-12 grossly intact, moves all extremities in coordinated fashion ? ? ?Radiological Exams on Admission: ?Independently reviewed - see discussion in A/P where applicable ? ?DG CHEST PORT 1 VIEW ? ?Result Date: 06/27/2021 ?CLINICAL DATA:  Short of breath EXAM: PORTABLE CHEST 1 VIEW COMPARISON:  06/01/2021 FINDINGS: Background changes of fibrotic interstitial lung disease. Increased opacification of the right lung. No significant pleural effusion. No pneumothorax. Similar cardiomediastinal contours. IMPRESSION: Increased opacification of the right lung may reflect pneumonia, asymmetric edema, or exacerbation of interstitial lung disease. Electronically Signed   By: Macy Mis M.D.   On: 07/08/2021 07:35    ? ?EKG: Independently reviewed.  Sinus tachycardia with rate 108; prolonged QTc 613; no evidence of acute ischemia ? ? ?Labs on Admission: I have personally reviewed the available labs and imaging studies at the ti

## 2021-07-08 NOTE — IPAL (Addendum)
?  Interdisciplinary Goals of Care Family Meeting ? ? ?Date carried out:: 07/13/2021 ? ?Location of the meeting: Bedside ? ?Member's involved: Physician, Nurse Practitioner, and Bedside Registered Nurse ? ?Durable Power of Tour manager: Patient   ? ?Discussion: We discussed goals of care for Eduardo Fuentes . ?Reviewed his respiratory status with pulmonary fibrosis and concern for ILD exacerbation, pneumonia. ?During a previous conversation he  wanted to be full DNR but now wants a more time to think about it and meet with palliative care.  He has requested to go back to limited code status.  He still does not want intubation but wants CPR and chest compressions. ? ?Code status: Limited code. No intubation but is ok with CPR.  ? ?Disposition: Continue current acute care. Consult to palliative care placed ? ? ?Time spent for the meeting: 5 mins ? ?[name] ?07/16/2021, 2:12 PM  ?

## 2021-07-09 DIAGNOSIS — Z7189 Other specified counseling: Secondary | ICD-10-CM

## 2021-07-09 DIAGNOSIS — J9611 Chronic respiratory failure with hypoxia: Secondary | ICD-10-CM

## 2021-07-09 DIAGNOSIS — R5381 Other malaise: Secondary | ICD-10-CM | POA: Insufficient documentation

## 2021-07-09 DIAGNOSIS — J9621 Acute and chronic respiratory failure with hypoxia: Secondary | ICD-10-CM | POA: Diagnosis not present

## 2021-07-09 DIAGNOSIS — J189 Pneumonia, unspecified organism: Secondary | ICD-10-CM

## 2021-07-09 DIAGNOSIS — Z515 Encounter for palliative care: Secondary | ICD-10-CM

## 2021-07-09 DIAGNOSIS — J84112 Idiopathic pulmonary fibrosis: Secondary | ICD-10-CM | POA: Diagnosis not present

## 2021-07-09 DIAGNOSIS — F4001 Agoraphobia with panic disorder: Secondary | ICD-10-CM | POA: Diagnosis not present

## 2021-07-09 LAB — CBC
HCT: 35.8 % — ABNORMAL LOW (ref 39.0–52.0)
Hemoglobin: 11.7 g/dL — ABNORMAL LOW (ref 13.0–17.0)
MCH: 32.2 pg (ref 26.0–34.0)
MCHC: 32.7 g/dL (ref 30.0–36.0)
MCV: 98.6 fL (ref 80.0–100.0)
Platelets: 203 10*3/uL (ref 150–400)
RBC: 3.63 MIL/uL — ABNORMAL LOW (ref 4.22–5.81)
RDW: 15.3 % (ref 11.5–15.5)
WBC: 8.6 10*3/uL (ref 4.0–10.5)
nRBC: 0 % (ref 0.0–0.2)

## 2021-07-09 LAB — BASIC METABOLIC PANEL
Anion gap: 9 (ref 5–15)
BUN: 20 mg/dL (ref 8–23)
CO2: 25 mmol/L (ref 22–32)
Calcium: 8.8 mg/dL — ABNORMAL LOW (ref 8.9–10.3)
Chloride: 103 mmol/L (ref 98–111)
Creatinine, Ser: 1.04 mg/dL (ref 0.61–1.24)
GFR, Estimated: >60 mL/min (ref >60)
Glucose, Bld: 148 mg/dL — ABNORMAL HIGH (ref 70–99)
Potassium: 4.5 mmol/L (ref 3.5–5.1)
Sodium: 137 mmol/L (ref 135–145)

## 2021-07-09 LAB — PROCALCITONIN: Procalcitonin: 0.5 ng/mL

## 2021-07-09 MED ORDER — POTASSIUM CHLORIDE 10 MEQ/100ML IV SOLN
10.0000 meq | INTRAVENOUS | Status: AC
  Administered 2021-07-09 (×2): 10 meq via INTRAVENOUS
  Filled 2021-07-09 (×3): qty 100

## 2021-07-09 MED ORDER — FUROSEMIDE 10 MG/ML IJ SOLN
60.0000 mg | Freq: Three times a day (TID) | INTRAMUSCULAR | Status: AC
  Administered 2021-07-09 (×3): 60 mg via INTRAVENOUS
  Filled 2021-07-09 (×3): qty 6

## 2021-07-09 MED ORDER — SODIUM CHLORIDE 0.9 % IV SOLN
2.0000 g | Freq: Three times a day (TID) | INTRAVENOUS | Status: DC
  Administered 2021-07-09 – 2021-07-10 (×3): 2 g via INTRAVENOUS
  Filled 2021-07-09 (×2): qty 12.5

## 2021-07-09 MED ORDER — POTASSIUM CHLORIDE 10 MEQ/100ML IV SOLN
10.0000 meq | INTRAVENOUS | Status: AC
  Administered 2021-07-09 (×2): 10 meq via INTRAVENOUS
  Filled 2021-07-09 (×2): qty 100

## 2021-07-09 NOTE — TOC Progression Note (Signed)
Transition of Care (TOC) - Progression Note  ? ? ?Patient Details  ?Name: Eduardo Fuentes ?MRN: [id number] ?Date of Birth: 06/20/1950 ? ?Transition of Care (TOC) CM/SW Contact  ?Zenon Mayo, RN ?Phone Number: ?07/09/2021, 4:47 PM ? ?Clinical Narrative:    ? ?from home with wife, CHF, hypoxia, PNA, pulmonary fibrosis, conts on IV abx, IV steroids, IV lasix, IV potassium, on NRB, changing to HFNC 12 liters.  He has home oxygen with [employer] , which only goes up to 10 liters per patient.  He states pta , he used 3 liters at rest and 8 liters while up moving around.  TOC will continue to follow for dc needs.  ? ?  ?  ? ?Expected Discharge Plan and Services ?  ?  ?  ?  ?  ?                ?  ?  ?  ?  ?  ?  ?  ?  ?  ?  ? ? ?Social Determinants of Health (SDOH) Interventions ?  ? ?Readmission Risk Interventions ?   ? View : No data to display.  ?  ?  ?  ? ? ?

## 2021-07-09 NOTE — Progress Notes (Signed)
?PROGRESS NOTE ? ?Eduardo Fuentes ZTI:[id number] DOB: [birth year] DOA: 06/25/2021 ?PCP: Lujean Amel, MD ? ? LOS: 1 day  ? ?Brief Narrative / Interim history: ?71 year old male with HTN, pulmonary fibrosis on 8 L with activities at home, prostate cancer, comes into the hospital with shortness of breath.  He has been having IPF for a number of years, but over the last several months he became worse, and started on oxygen about 3 months ago.  He has been on 2 to 3 L, but when seen by pulmonology last Friday he was decreased to 8 L with exertion.  He continued to progress getting worse at home and eventually came to the hospital.  Sats were at 60% despite nasal cannula.  Pulmonary consulted on admission ? ?Subjective / 24h Interval events: ?Thinks that his breathing is stable while using a nonrebreather and this morning.  Can barely speak in full sentences.  Denies any chest pain.  Denies any fever or chills. ? ?Assesement and Plan: ?Principal Problem: ?  Acute on chronic respiratory failure with hypoxia (HCC) ?Active Problems: ?  Major depressive disorder, recurrent episode ([place]) ?  Panic disorder with agoraphobia ?  IPF (idiopathic pulmonary fibrosis) ([place]) ?  Paroxysmal atrial fibrillation ([place]) CHA2VASC2 = 3; on Eliiquis ?  Prolonged QT interval ? ? ?Principal problem ?Acute on chronic respiratory failure with hypoxia-concern is about apparent progression of his IPF.  Over the last few months it looks like his becoming more more hypoxic and most recently requiring up to 8 L with exertion.  Imaging on admission showed possible pneumonia.  Broad-spectrum antibiotics were added, pulmonary was consulted and he was also added on steroids.  Remains visibly hypoxic this morning, barely able to speak.  Unable to tolerate nasal cannula and having to use a nonrebreather.  There was a question about aspiration but he tells me that he has no recollection of that ? ?Active problems ?Pulmonary fibrosis-as above, pulm following.   Continue Ofec (wife to bring), continue steroids, nebulizers.  Also diuresing ? ?Prolonged QTc-monitor ? ?Essential hypertension-continue diltiazem ? ?Hyperlipidemia-continue statin ? ?Depression/anxiety-continue Klonopin ? ?Paroxysmal A-fib -continue diltiazem, Eliquis ? ?Scheduled Meds: ? apixaban  5 mg Oral BID  ? clonazePAM  1 mg Oral BID  ? diltiazem  120 mg Oral q AM  ? docusate sodium  100 mg Oral BID  ? furosemide  60 mg Intravenous Q8H  ? gabapentin  600 mg Oral BID  ? methylPREDNISolone (SOLU-MEDROL) injection  80 mg Intravenous Q12H  ? Nintedanib  150 mg Oral BID  ? pantoprazole  40 mg Oral QHS  ? psyllium  1 packet Oral QHS  ? rosuvastatin  20 mg Oral Daily  ? sodium chloride flush  3 mL Intravenous Q12H  ? ?Continuous Infusions: ? sodium chloride 75 mL/hr at 07/09/2021 1030  ? ceFEPime (MAXIPIME) IV 2 g (07/09/21 1049)  ? potassium chloride 10 mEq (07/09/21 1028)  ? ?PRN Meds:.acetaminophen **OR** acetaminophen, bisacodyl, guaiFENesin, hydrALAZINE, HYDROcodone-acetaminophen, levalbuterol, morphine injection, polyethylene glycol ? ?Diet Orders (From admission, onward)  ? ?  Start     Ordered  ? 07/09/21 1016  DIET DYS 3 Room service appropriate? Yes; Fluid consistency: Thin  Diet effective now       ?Question Answer Comment  ?Room service appropriate? Yes   ?Fluid consistency: Thin   ?  ? 07/09/21 1016  ? ?  ?  ? ?  ? ? ?DVT prophylaxis:  ?apixaban (ELIQUIS) tablet 5 mg  ? ?Lab Results  ?Component  Value Date  ? PLT 203 07/09/2021  ? ? ?  Code Status: Partial Code ? ?Family Communication: no family at bedside  ? ?Status is: Inpatient ? ?Remains inpatient appropriate because: Profound hypoxia ? ?Level of care: Progressive ? ?Consultants:  ?Pulmonary  ? ?Procedures:  ?none ? ?Microbiology  ?none ? ?Antimicrobials: ?Vanc / Cefepime 5/15 >>  ? ? ?Objective: ?Vitals:  ? 07/09/21 0218 07/09/21 0442 07/09/21 0741 07/09/21 1118  ?BP:  124/87 (!) 115/92 (!) 126/93  ?Pulse: 95 87 81 79  ?Resp: 20 20 (!) 21 20  ?Temp:   97.7 ?F (36.5 ?C)    ?TempSrc:  Oral Oral Oral  ?SpO2: 94% 97% 93% 96%  ?Weight:      ?Height:      ? ? ?Intake/Output Summary (Last 24 hours) at 07/09/2021 1137 ?Last data filed at 07/09/2021 1118 ?Gross per 24 hour  ?Intake 440 ml  ?Output 1350 ml  ?Net -910 ml  ? ?Wt Readings from Last 3 Encounters:  ?07/09/21 71.9 kg  ?07/05/21 72.8 kg  ?06/21/21 71 kg  ? ? ?Examination: ? ?Constitutional: Visibly dyspneic ?Eyes: no scleral icterus ?ENMT: Mucous membranes are moist.  ?Neck: normal, supple ?Respiratory: Increased respiratory effort, diffuse bibasilar Velcro type sounds, no wheezing ?Cardiovascular: Regular rate and rhythm, no murmurs / rubs / gallops. No LE edema.  ?Abdomen: non distended, no tenderness. Bowel sounds positive.  ?Musculoskeletal: no clubbing / cyanosis.  ?Skin: no rashes ?Neurologic: non focal  ? ? ?Data Reviewed: I have independently reviewed following labs and imaging studies  ? ?CBC ?Recent Labs  ?Lab 07/06/2021 ?0703 07/09/2021 ?0716 07/09/21 ?0450  ?WBC 14.9*  --  8.6  ?HGB 13.0 14.6 11.7*  ?HCT 39.0 43.0 35.8*  ?PLT 233  --  203  ?MCV 99.2  --  98.6  ?MCH 33.1  --  32.2  ?MCHC 33.3  --  32.7  ?RDW 15.4  --  15.3  ? ? ?Recent Labs  ?Lab 07/17/2021 ?0703 07/15/2021 ?0321 07/10/2021 ?1111 07/09/21 ?0450  ?NA 137 139  --  137  ?K 3.5 3.5  --  4.5  ?CL 100  --   --  103  ?CO2 28  --   --  25  ?GLUCOSE 100*  --   --  148*  ?BUN 21  --   --  20  ?CREATININE 1.25*  --   --  1.04  ?CALCIUM 9.0  --   --  8.8*  ?MG  --   --  1.7  --   ?PROCALCITON  --   --   --  0.50  ?BNP 1,048.1*  --   --   --   ? ? ?------------------------------------------------------------------------------------------------------------------ ?No results for input(s): CHOL, HDL, LDLCALC, TRIG, CHOLHDL, LDLDIRECT in the last 72 hours. ? ?No results found for: HGBA1C ?------------------------------------------------------------------------------------------------------------------ ?No results for input(s): TSH, T4TOTAL, T3FREE, THYROIDAB in  the last 72 hours. ? ?Invalid input(s): FREET3 ? ?Cardiac Enzymes ?No results for input(s): CKMB, TROPONINI, MYOGLOBIN in the last 168 hours. ? ?Invalid input(s): CK ?------------------------------------------------------------------------------------------------------------------ ?   ?Component Value Date/Time  ? BNP 1,048.1 (H) 07/02/2021 0703  ? ? ?CBG: ?No results for input(s): GLUCAP in the last 168 hours. ? ?Recent Results (from the past 240 hour(s))  ?Resp Panel by RT-PCR (Flu A&B, Covid) Nasopharyngeal Swab     Status: None  ? Collection Time: 07/22/2021  7:45 AM  ? Specimen: Nasopharyngeal Swab; Nasopharyngeal(NP) swabs in vial transport medium  ?Result Value Ref Range Status  ? SARS  Coronavirus 2 by RT PCR NEGATIVE NEGATIVE Final  ?  Comment: (NOTE) ?SARS-CoV-2 target nucleic acids are NOT DETECTED. ? ?The SARS-CoV-2 RNA is generally detectable in upper respiratory ?specimens during the acute phase of infection. The lowest ?concentration of SARS-CoV-2 viral copies this assay can detect is ?138 copies/mL. A negative result does not preclude SARS-Cov-2 ?infection and should not be used as the sole basis for treatment or ?other patient management decisions. A negative result may occur with  ?improper specimen collection/handling, submission of specimen other ?than nasopharyngeal swab, presence of viral mutation(s) within the ?areas targeted by this assay, and inadequate number of viral ?copies(<138 copies/mL). A negative result must be combined with ?clinical observations, patient history, and epidemiological ?information. The expected result is Negative. ? ?Fact Sheet for Patients:  ?EntrepreneurPulse.com.au ? ?Fact Sheet for Healthcare Providers:  ?IncredibleEmployment.be ? ?This test is no t yet approved or cleared by the [place] FDA and  ?has been authorized for detection and/or diagnosis of SARS-CoV-2 by ?FDA under an Emergency Use Authorization (EUA). This EUA will  remain  ?in effect (meaning this test can be used) for the duration of the ?COVID-19 declaration under Section 564(b)(1) of the Act, 21 ?U.S.C.section 360bbb-3(b)(1), unless the authorization is term

## 2021-07-09 NOTE — Assessment & Plan Note (Addendum)
-   BNP 06/25/21 >> 44. No leg swelling on exam. Lasix discontinued  ?

## 2021-07-09 NOTE — Consult Note (Signed)
Palliative Fuentes Consult Note                                  Date: 07/09/2021   Patient Name: Eduardo Fuentes  DOB: 1951/01/13  MRN: [id number]  Age / Sex: 71 y.o., male  PCP: Eduardo Amel, MD Referring Physician: Caren Griffins, MD  Reason for Consultation: Establishing goals of Fuentes  HPI/Patient Profile: 71 y.o. male  with past medical history of IPF, atrial fibrillation, prostate cancer, coronary artery calcification, sliding hiatal hernia, generalized anxiety with panic disorder and agoraphobia, GERD, and hyperlipidemia.  He presented to the emergency department on 07/09/2021 with 2 days of worsening dyspnea.  He had been seen in the pulmonary office 5/12 and recommended to increase O2 to 8L on exertion.  Later that evening he had acute onset of worsening dyspnea which became progressively worse over the next 2 days. He had not increased his oxygen to 8 L as recommended due to the lack of the concentrator in his home. On arrival to the ED, his O2 sats were in the 30s.  He quickly improved to the upper 90s with nonrebreather.  Chest x-ray showed increased opacification in the right lung suspicious for pneumonia.  Labs significant for WBC 14.9 and BNP 1048.  Troponin elevated at 149, but is suspected to be demand ischemia.  He had been advised to stop Lasix as he did not look volume overloaded, and most recent BNP was 44. He is admitted to [facility] with pneumonia, acute on chronic hypoxic respiratory failure, and acute on chronic CHF.  Past Medical History:  Diagnosis Date   Agatston CAC score, <100 04/2018   Arthritis    NECK   Cough    NON-PRODUCTIVE   Depressive disorder 10/2011   follow by dr Eduardo Fuentes psychiatry; has anxiety and panic disorder as well.   Diverticulosis    Diverticulosis of colon    Followed by Dr. Henrene Fuentes   Essential hypertension    Generalized anxiety disorder    GERD (gastroesophageal reflux disease)    WATCHES DIET    Hemorrhoid    Hiatal hernia    Describes a sliding hiatal hernia   History of colon polyps    History of kidney stones    Idiopathic pulmonary fibrosis ([place])    Microscopic hematuria    Nocturia    Panic attacks    Prostate cancer ([place]) 07/2012   Dr. Arnell Fuentes urology.   Sinus infection    Tinnitus    LEFT EAR -- CHRONIC    Subjective:   I have reviewed medical records including EPIC notes, labs and imaging, and met with patient at bedside to discuss diagnosis, prognosis, GOC, EOL wishes, disposition, and options.  I introduced Palliative Medicine as specialized medical Fuentes for people living with serious illness. It focuses on providing relief from the symptoms and stress of a serious illness.   We discussed a brief life review of the patient.  Eduardo Fuentes has lived in the [place] area his entire life.  He and his wife Eduardo Fuentes have been married for 52 years.  They do not have children.  He is retired from work in [employer].  He worked at the [employer] on [employer] for over 25 years.  He enjoys playing golf, but has not had the energy to play in the last couple years.  He and Eduardo Fuentes have a 50-year-old Shih-tzu named Eduardo Fuentes,  who they absolutely adore.  Eduardo Fuentes and Eduardo Fuentes live together in their home in [place].  Prior to admission, he was ambulatory without assistance.  However his activity level has become progressively limited due to dyspnea.    We discussed his/her current illness and what it means in the larger context of her ongoing co-morbidities. [name] verbalizes understanding that he likely has pneumonia. He also verbalizes understanding that he has pulmonary fibrosis, which is non-curable and progressive. He is not aware that he has CHF, so we discussed that in detail.  Discussed that chronic results in decreased functional status over time, as patients do not usually return to previous baseline after an illness or exacerbation.   [name] tells me that  when he was first diagnosed with IPF, he and Eduardo Fuentes were devastated.  He recalls being taken into a private room and given the diagnosis.  He was told "you are not going to die right now".  He was told "you can continue living a normal life for a few years".  [name] shares that he thinks this time may be coming to an end.  I attempted to elicit values and goals of Fuentes important to the patient. He wants to treat what is treatable. He is hopeful for improvement, and to be able to return home.    The difference between full scope medical intervention and comfort Fuentes was considered in light of the patient's goals of Fuentes. We did discuss code status. I confirmed that he does not want to be on a ventilator. Encouraged Carnell to consider full DNR status understanding evidenced based poor outcomes in similar hospitalized patients, as the cause of the arrest is likely associated with chronic/terminal disease rather than a reversible acute cardio-pulmonary event. I explained that DNR does not change the medical plan and it only comes into effect after a person has arrested (died).  It is a protective measure to keep [place] from harming the patient in their last moments of life. He seems to indicate that DNR is appropriate however is not yet ready to make that decision and wants to discuss further with his wife.  Hospice and Palliative Fuentes services outpatient were briefly explained and offered.  Questions and concerns were addressed.  I let Sho know I would return to service on Friday 5/19 and would follow up at that time.   I attempted to call his wife/Eduardo Fuentes with answer - voicemail left requesting a return call.    Review of Systems  Constitutional:  Positive for fatigue.  Respiratory:  Positive for shortness of breath.    Objective:   Primary Diagnoses: Present on Admission:  Acute on chronic respiratory failure with hypoxia (HCC)  Paroxysmal atrial fibrillation (HCC) CHA2VASC2 = 3; on Eliiquis  Panic  disorder with agoraphobia  Major depressive disorder, recurrent episode ([place])  IPF (idiopathic pulmonary fibrosis) (HCC)  Prolonged QT interval   Physical Exam Vitals reviewed.  Constitutional:      Appearance: He is ill-appearing. He is not toxic-appearing.  Cardiovascular:     Rate and Rhythm: Normal rate.  Pulmonary:     Effort: Pulmonary effort is normal. Tachypnea present.  Neurological:     Mental Status: He is alert and oriented to person, place, and time.    Vital Signs:  BP (!) 115/92 (BP Location: Right Arm)   Pulse 81   Temp 97.7 F (36.5 C) (Oral)   Resp (!) 21   Ht 5' 9" (1.753 m)   Wt 71.9 kg   SpO2  93%   BMI 23.41 kg/m   Palliative Assessment/Data: PPS 40%     Assessment & Plan:   SUMMARY OF RECOMMENDATIONS   Partial code as previously documented - no intubation Continue all current interventions with watchful waiting Patient is hopeful for improvement and to return home PMT will follow up Friday 5/19 - please call [phone] if there are urgent needs  Primary Decision Maker: PATIENT  Prognosis:  Unable to determine  Discharge Planning:  To Be Determined     Thank you for allowing us to participate in the Fuentes of St[facility] D [facility]  MDM - High   Signed by:  , NP Palliative Medicine Team  Team Phone # [phone]  For individual providers, please see AMION                

## 2021-07-09 NOTE — Progress Notes (Signed)
Speech Language Pathology Treatment: Dysphagia  ?Patient Details ?Name: Eduardo Fuentes ?MRN: [id number] ?DOB: 08/18/1950 ?Today's Date: 07/09/2021 ?Time: 8372-9021 ?SLP Time Calculation (min) (ACUTE ONLY): 29 min ? ?Assessment / Plan / Recommendation ?Clinical Impression ? Pt was started on soft diet and thin liquids this morning per MD. Pt and RN denied any overt difficulties with breakfast or when taking meds. During trials with SLP, pt did have coughing episode x1 while he was masticating a graham cracker, with pt self-reporting that he thought some of the crumbs went down the wrong way. SLP provided water, after which pt was able to clear his mouth well with no further overt s/s of aspiration observed. Pt also needed Min cues to manage NRB while eating. SpO2 dropped as low as 84% x1, recovering quickly once mask was replaced. Pt was able to self-monitor and identify when his breathing felt like it had returned to baseline. Will adjust diet to mechanical soft diet to make food a little softer and in smaller pieces. Would continue thin liquids but provide meds in puree. Education was provided about esophageal, aspiration, and respiratory precautions. Will continue to follow. ?  ?HPI HPI: Pt is a 71 yo male recently admitted for IPF exacerbation and elevated troponins, now readmitted wtih worsening dyspnea and hypoxia to the 50s. CXR showed new R lung infiltrates. Admitted with acute hypoxic respiratory failure likely secondary to PNA vs IPF exacerbation. PMH includes: difficulty swallowing due to esophageal strictures, GERD, HH, idiopathic pulmonary fibrosis, arthritis (neck), non-productive cough, generalized anxiety disorder, panic attacks ?  ?   ?SLP Plan ? Continue with current plan of care ? ?  ?  ?Recommendations for follow up therapy are one component of a multi-disciplinary discharge planning process, led by the attending physician.  Recommendations may be updated based on patient status, additional  functional criteria and insurance authorization. ?  ? ?Recommendations  ?Diet recommendations: Dysphagia 3 (mechanical soft);Thin liquid ?Liquids provided via: Cup;Straw ?Medication Administration: Whole meds with puree ?Supervision: Patient able to self feed;Full supervision/cueing for compensatory strategies ?Compensations: Slow rate;Small sips/bites;Other (Comment) (take breaks PRN for breathing) ?Postural Changes and/or Swallow Maneuvers: Seated upright 90 degrees;Upright 30-60 min after meal  ?   ?    ?   ? ? ? ? Oral Care Recommendations: Oral care BID ?Follow Up Recommendations:  (tba) ?Assistance recommended at discharge: PRN ?SLP Visit Diagnosis: Dysphagia, unspecified (R13.10) ?Plan: Continue with current plan of care ? ? ? ? ?  ?  ? ? ?Eduardo Fuentes., M.A. CCC-SLP ?Acute Rehabilitation Services ?Office [phone] ? ?Secure chat preferred ? ? ?07/09/2021, 10:10 AM ?

## 2021-07-09 NOTE — Assessment & Plan Note (Signed)
-   Referred to home PT for eval/treat ?

## 2021-07-09 NOTE — Assessment & Plan Note (Addendum)
-   Following with Dr. Chase Caller, maintained on OFEV '150mg'$  twice daily. He has soft stools but denies diarrhea. He has been losing weight but put on 4 lbs. Appetite and energy level are decreased. ILD symptom scale today is improved compared to previous visit. Chronic cough is also better with delsym OTC and prn tesslon perles. Follow-up 6-8 weeks with Beth/ AND 3 months with Dr. Chase Caller (30 MIN ILD) ?

## 2021-07-09 NOTE — Progress Notes (Signed)
Attempted to wean pt to a high flow salter for oxygen delivery for patient comfort. Sats holding at 91-93% on 10lpm with patient at rest. Pt back to NRB at his request with sats of 95%. HF cannula at bedside for patient to use during meals to help with desats.  ?

## 2021-07-09 NOTE — Assessment & Plan Note (Addendum)
-   He was able to maintain O2 saturation >90% on 2L at rest but needs 8L with exertion. He is not consistently increased oxygen liter flow with exertion, stressed the importance of this and he stated understanding  ?

## 2021-07-09 NOTE — Progress Notes (Signed)
Pharmacy Antibiotic Note ? ?Eduardo Fuentes is a 71 y.o. male admitted on 07/05/2021 with pneumonia.  Pharmacy has been consulted for cefepime dosing. ? ?Renal function improved. He is afebrile, WBC down to normal.  ? ?Plan: ?Increase cefepime to 2g IV q8h ?Vancomycin discontinued ?Monitor renal function, clinical progress, cultures/sensitivities ?F/U LOT and de-escalate as able ? ? ?Height: '5\' 9"'$  (175.3 cm) ?Weight: 71.9 kg (158 lb 8.2 oz) ?IBW/kg (Calculated) : 70.7 ? ?Temp (24hrs), Avg:98.3 ?F (36.8 ?C), Min:97.7 ?F (36.5 ?C), Max:98.8 ?F (37.1 ?C) ? ?Recent Labs  ?Lab 07/12/2021 ?0703 07/09/21 ?0017  ?WBC 14.9* 8.6  ?CREATININE 1.25* 1.04  ? ?  ?Estimated Creatinine Clearance: 65.1 mL/min (by C-G formula based on SCr of 1.04 mg/dL).   ? ?Allergies  ?Allergen Reactions  ? Aleve [Naproxen Sodium] Anaphylaxis, Nausea Only and Other (See Comments)  ?  GI UPSET- Patient does not remember "anaphylaxis" in 2023  ? Demerol [Meperidine] Rash  ?   ?  ? Hyzaar [Losartan Potassium-Hctz] Other (See Comments)  ?  "Felt badly" ?  ? Lisinopril Cough  ? ? ?Antimicrobials this admission: ?Cefepime 5/15 >>  ?Vancomycin  5/15 >> 5/16 ? ?Dose adjustments this admission: ? ? ?Microbiology results: ?5/15 MRSA PCR: neg ?5/15 sputum: recollect ? ? ?Thank you for involving pharmacy in this patient's care. ? ?Renold Genta, PharmD, BCPS ?Clinical Pharmacist ?Clinical phone for 07/09/2021 until 3p is [phone] ?07/09/2021 10:30 AM ? ?**Pharmacist phone directory can be found on [email or web address] listed under [place]** ? ? ?

## 2021-07-09 NOTE — Consult Note (Signed)
? ?NAME:  Eduardo Fuentes, MRN:  [id number], DOB:  [birth year], LOS: 1 ?ADMISSION DATE:  07/17/2021, CONSULTATION DATE:  07/18/2021 ?REFERRING MD:  Masters, CHIEF COMPLAINT:  Hypoxemia, IPF, Pneumonia, Heart Failure  ? ?History of Present Illness:  ?71 year old male, never smoker. PMH significant for coronary artery calcification, afib, IPF, prostate CA, hyperlipidemia, GERD with sliding hiatal hernia, GAD, panic disorder with agoraphobia. ?Maintenance of Ofev. Pt. Presents to the ED 07/01/2021 after 2 days of worsening dyspnea than his baseline. Quick onset, started Friday evening 5/12 and became progressively worse Saturday and Sunday .  ?He stated he had not increased his oxygen to 8 L as recommended due to the lacation of the concentrator in his home.  ?In the ED he arrived with sats in the 50's. They quickly rebounded with NRB to upper 90's. RR is still 20. CXR revealed increased opacification in the right lung suspicious for pneumonia. He has endorsed chills, but no fever, WBC was  14.9, BNP 1048.1, Initial ABG 7.493/ 43/77/33.2/TCO2 of 35, sat of 96%. Troponin of 149, which I suspect is demand ischemia. He states he has not been increasing his oxygen at home over the past 2 days, and endorses saturations that were well below 88% . He states he has not had significant chest pain. ?He had been see in the office 07/05/2021, oxygen use 2-8L [place]. Total dyspnea score at that time was 15. PFT's showed DLCO of 37%, which is significantly lower than his previous DLCO 9/22 which was 61%. He had been advised to stop his lasix as he did not look volume overloaded, and most recent BNP was 44 . He states he did eat a hamburger on the way home from his office appointment , does not endorse choking on his food. He started having worse than baseline dyspnea Friday evening, with continued decompensation Saturday and Sunday.  ?Pt has significantly improved on NRB , PCCM were asked to evaluate for admission. Patient states he does not  want intubation, but would agree to BiPAP as needed. Pt. Will be admitted by [facility] , Step Down unit, and PCCM will remain on as daily consult with ability to take over if he decompensates.  ? ?Pertinent  Medical History  ?  ?Past Medical History:  ?Diagnosis Date  ? Agatston CAC score, <100 04/2018  ? Arthritis   ? NECK  ? Cough   ? NON-PRODUCTIVE  ? Depressive disorder 10/2011  ? follow by dr Toy Care psychiatry; has anxiety and panic disorder as well.  ? Diverticulosis   ? Diverticulosis of colon   ? Followed by Dr. Henrene Pastor  ? Essential hypertension   ? Generalized anxiety disorder   ? GERD (gastroesophageal reflux disease)   ? WATCHES DIET  ? Hemorrhoid   ? Hiatal hernia   ? Describes a sliding hiatal hernia  ? History of colon polyps   ? History of kidney stones   ? Idiopathic pulmonary fibrosis ([place])   ? Microscopic hematuria   ? Nocturia   ? Panic attacks   ? Prostate cancer ([place]) 07/2012  ? Dr. Arnell Sieving urology.  ? Sinus infection   ? Tinnitus   ? LEFT EAR -- CHRONIC  ?  ? ?Significant Hospital Events: ?Including procedures, antibiotic start and stop dates in addition to other pertinent events   ?Admitted 05/29/2021 for IPF/ CHF flare ?Admission 06/29/2021 with Pneumonia, CHF  ?Vanc and Cefepime initiated 06/26/2021  ? ?Cardiac Cath 05/30/2021 ?The left ventricular systolic function is normal. ?  LV end diastolic pressure is normal. ?  The left ventricular ejection fraction is 50-55% by visual estimate. ?  Hemodynamic findings consistent with mild pulmonary hypertension. ?  There is no aortic valve stenosis. ?  Mild, diffuse nonobstructive coronary artery disease. ?  Aortic saturation 94%, PA saturation 65%, PA pressure 55/21, mean PA pressure 34 mmHg, pulmonary capillary wedge pressure 9 mmHg, cardiac output 4.75 L/min, cardiac index 2.5. ? ?Elevated troponin likely from demand ischemia.  Nonobstructive, minimal CAD.  Continue medical therapy. ?  ?  ? ?Interim History / Subjective:  ?Eating  breakfast this AM and taking off NRB to bite. ? ?Feels about the same.  Cough with clear sputum production. ? ?Objective   ?Blood pressure (!) 115/92, pulse 81, temperature 97.7 ?F (36.5 ?C), temperature source Oral, resp. rate (!) 21, height '5\' 9"'$  (1.753 m), weight 71.9 kg, SpO2 93 %. ?   ?   ? ?Intake/Output Summary (Last 24 hours) at 07/09/2021 0849 ?Last data filed at 07/09/2021 0444 ?Gross per 24 hour  ?Intake 340 ml  ?Output 500 ml  ?Net -160 ml  ? ?Filed Weights  ? 06/29/2021 1104 07/09/21 0100  ?Weight: 71.1 kg 71.9 kg  ? ? ?Examination: ?No distress ?Diminished breath sounds more so on R ?Muscle wasting ?Not much edema ?Moves all 4 ext to command ?Good insight ? ?Resolved Hospital Problem list   ? ? ?Assessment & Plan:  ?Acute on chronic hypoxemic respiratory failure- with background of IPF, worsening R sided haziness.  Viral studies neg, MRSA PCR neg.  Aspiration vs. AE-IPF vs. Pulmonary edema vs. CAP vs. Some combination. ? ?- DC vanc, continue cefepime, check Pct, if neg would DC cefepime ?- Push diuresis as tolerated by renal function (ordered) ?- Does not tolerate HHFNC, continue NRB for now ?- Continue IV steroids ?- Guarded prognosis ?- Will follow ?- See Dr. Matilde Bash IPAL note 5/15 ? ?Erskine Emery MD PCCM ?  ?

## 2021-07-10 ENCOUNTER — Inpatient Hospital Stay (HOSPITAL_COMMUNITY): Payer: PPO

## 2021-07-10 DIAGNOSIS — J9611 Chronic respiratory failure with hypoxia: Secondary | ICD-10-CM | POA: Diagnosis not present

## 2021-07-10 LAB — BASIC METABOLIC PANEL
Anion gap: 9 (ref 5–15)
BUN: 31 mg/dL — ABNORMAL HIGH (ref 8–23)
CO2: 33 mmol/L — ABNORMAL HIGH (ref 22–32)
Calcium: 8.8 mg/dL — ABNORMAL LOW (ref 8.9–10.3)
Chloride: 96 mmol/L — ABNORMAL LOW (ref 98–111)
Creatinine, Ser: 1.2 mg/dL (ref 0.61–1.24)
GFR, Estimated: >60 mL/min (ref >60)
Glucose, Bld: 141 mg/dL — ABNORMAL HIGH (ref 70–99)
Potassium: 4.1 mmol/L (ref 3.5–5.1)
Sodium: 138 mmol/L (ref 135–145)

## 2021-07-10 LAB — MAGNESIUM: Magnesium: 2 mg/dL (ref 1.7–2.4)

## 2021-07-10 MED ORDER — SODIUM CHLORIDE 0.9 % IV SOLN
2.0000 g | Freq: Three times a day (TID) | INTRAVENOUS | Status: DC
  Administered 2021-07-10 – 2021-07-15 (×16): 2 g via INTRAVENOUS
  Filled 2021-07-10 (×16): qty 12.5

## 2021-07-10 MED ORDER — SODIUM CHLORIDE 0.9 % IV SOLN: 2.0000 g | Freq: Two times a day (BID) | INTRAVENOUS | Status: DC

## 2021-07-10 NOTE — Progress Notes (Signed)
Speech Language Pathology Treatment: Dysphagia  ?Patient Details ?Name: Eduardo Fuentes ?MRN: [id number] ?DOB: 10-21-1950 ?Today's Date: 07/10/2021 ?Time: 4098-1191 ?SLP Time Calculation (min) (ACUTE ONLY): 25 min ? ?Assessment / Plan / Recommendation ?Clinical Impression ? Patient seen by SLP for skilled treatment session focused on dysphagia goals. Patient was awake and alert when SLP entered the room. He is currently on HFNC at 12L and per patient, plan is to trial him tomorrow on seeing if he can maintain SpO2 above 88% during physical activity while reducing oxygen requirement. Sitting upright in bed, patient's SpO2 remained in range 91-93% and RR around 20. He reported that he ate well for breakfast and lunch meals and aside from his baseline throat clearing which occurs intermittently, he did not notice any swallow difficulties. He had an egg salad sandwich for lunch which was cut into small pieces (on Dys 3 solids currently) which is similar to what he has been doing at home. SLP observed patient with several straw sips of thin liquids and swallow initiation appeared timely, no overt s/s aspiration or penetration and no change in voice or vitals. Patient appears very aware of swallow safety precautions and seems appropriately cautious during PO intake. SLP provided patient with handout describing IDDSI "Level 7: Regular Easy to Chew" diet consistencies and briefly reviewed. SLP will plan to f/u with patient at least one more time to ensure he is on least restrictive diet.  ? ?  ?HPI HPI: Eduardo Fuentes is a 71 yo male recently admitted for IPF exacerbation and elevated troponins, now readmitted wtih worsening dyspnea and hypoxia to the 50s. CXR showed new R lung infiltrates. Admitted with acute hypoxic respiratory failure likely secondary to PNA vs IPF exacerbation. PMH includes: difficulty swallowing due to esophageal strictures, GERD, HH, idiopathic pulmonary fibrosis, arthritis (neck), non-productive cough, generalized  anxiety disorder, panic attacks ?  ?   ?SLP Plan ? Continue with current plan of care ? ?  ?  ?Recommendations for follow up therapy are one component of a multi-disciplinary discharge planning process, led by the attending physician.  Recommendations may be updated based on patient status, additional functional criteria and insurance authorization. ?  ? ?Recommendations  ?Diet recommendations: Dysphagia 3 (mechanical soft);Thin liquid ?Liquids provided via: Straw;Cup ?Medication Administration: Whole meds with puree ?Supervision: Patient able to self feed ?Compensations: Slow rate;Small sips/bites ?Postural Changes and/or Swallow Maneuvers: Seated upright 90 degrees;Upright 30-60 min after meal  ?   ?    ?   ? ? ? ? Oral Care Recommendations: Oral care BID ?Follow Up Recommendations: No SLP follow up ?Assistance recommended at discharge: PRN ?SLP Visit Diagnosis: Dysphagia, unspecified (R13.10) ?Plan: Continue with current plan of care ? ? ? ? ?  ?  ? ?Sonia Baller, MA, CCC-SLP ?Speech Therapy ? ?

## 2021-07-10 NOTE — Plan of Care (Signed)
  Problem: Activity: Goal: Ability to tolerate increased activity will improve Outcome: Progressing   Problem: Clinical Measurements: Goal: Ability to maintain a body temperature in the normal range will improve Outcome: Progressing   Problem: Respiratory: Goal: Ability to maintain adequate ventilation will improve Outcome: Progressing Goal: Ability to maintain a clear airway will improve Outcome: Progressing   

## 2021-07-10 NOTE — Consult Note (Signed)
? ?NAME:  Eduardo Fuentes, MRN:  [id number], DOB:  Oct 02, 1950, LOS: 2 ?ADMISSION DATE:  07/10/2021, CONSULTATION DATE:  07/02/2021 ?REFERRING MD:  Masters, CHIEF COMPLAINT:  Hypoxemia, IPF, Pneumonia, Heart Failure  ? ?History of Present Illness:  ?71 year old male, never smoker. PMH significant for coronary artery calcification, afib, IPF, prostate CA, hyperlipidemia, GERD with sliding hiatal hernia, GAD, panic disorder with agoraphobia. ?Maintenance of Ofev. Pt. Presents to the ED 06/27/2021 after 2 days of worsening dyspnea than his baseline. Quick onset, started Friday evening 5/12 and became progressively worse Saturday and Sunday .  ?He stated he had not increased his oxygen to 8 L as recommended due to the lacation of the concentrator in his home.  ?In the ED he arrived with sats in the 50's. They quickly rebounded with NRB to upper 90's. RR is still 20. CXR revealed increased opacification in the right lung suspicious for pneumonia. He has endorsed chills, but no fever, WBC was  14.9, BNP 1048.1, Initial ABG 7.493/ 43/77/33.2/TCO2 of 35, sat of 96%. Troponin of 149, which I suspect is demand ischemia. He states he has not been increasing his oxygen at home over the past 2 days, and endorses saturations that were well below 88% . He states he has not had significant chest pain. ?He had been see in the office 07/05/2021, oxygen use 2-8L [place]. Total dyspnea score at that time was 15. PFT's showed DLCO of 37%, which is significantly lower than his previous DLCO 9/22 which was 61%. He had been advised to stop his lasix as he did not look volume overloaded, and most recent BNP was 44 . He states he did eat a hamburger on the way home from his office appointment , does not endorse choking on his food. He started having worse than baseline dyspnea Friday evening, with continued decompensation Saturday and Sunday.  ?Pt has significantly improved on NRB , PCCM were asked to evaluate for admission. Patient states he does not  want intubation, but would agree to BiPAP as needed. Pt. Will be admitted by [facility] , Step Down unit, and PCCM will remain on as daily consult with ability to take over if he decompensates.  ? ?Pertinent  Medical History  ?  ?Past Medical History:  ?Diagnosis Date  ? Agatston CAC score, <100 04/2018  ? Arthritis   ? NECK  ? Cough   ? NON-PRODUCTIVE  ? Depressive disorder 10/2011  ? follow by dr [facility] psychiatry; has anxiety and panic disorder as well.  ? Diverticulosis   ? Diverticulosis of colon   ? Followed by Dr. Henrene Pastor  ? Essential hypertension   ? Generalized anxiety disorder   ? GERD (gastroesophageal reflux disease)   ? WATCHES DIET  ? Hemorrhoid   ? Hiatal hernia   ? Describes a sliding hiatal hernia  ? History of colon polyps   ? History of kidney stones   ? Idiopathic pulmonary fibrosis ([place])   ? Microscopic hematuria   ? Nocturia   ? Panic attacks   ? Prostate cancer ([place]) 07/2012  ? Dr. Arnell Sieving urology.  ? Sinus infection   ? Tinnitus   ? LEFT EAR -- CHRONIC  ?  ? ?Significant Hospital Events: ?Including procedures, antibiotic start and stop dates in addition to other pertinent events   ?Admitted 05/29/2021 for IPF/ CHF flare ?Admission 07/01/2021 with Pneumonia, CHF  ?Vanc and Cefepime initiated 07/10/2021  ? ?Cardiac Cath 05/30/2021 ?The left ventricular systolic function is normal. ?  LV end diastolic pressure is normal. ?  The left ventricular ejection fraction is 50-55% by visual estimate. ?  Hemodynamic findings consistent with mild pulmonary hypertension. ?  There is no aortic valve stenosis. ?  Mild, diffuse nonobstructive coronary artery disease. ?  Aortic saturation 94%, PA saturation 65%, PA pressure 55/21, mean PA pressure 34 mmHg, pulmonary capillary wedge pressure 9 mmHg, cardiac output 4.75 L/min, cardiac index 2.5. ? ?Elevated troponin likely from demand ischemia.  Nonobstructive, minimal CAD.  Continue medical therapy. ?  ?  ? ?Interim History / Subjective:  ?Sleeping  reports feeling better ? ?Objective   ?Blood pressure 121/79, pulse 73, temperature 97.9 ?F (36.6 ?C), temperature source Oral, resp. rate 18, height '5\' 9"'$  (1.753 m), weight 70.1 kg, SpO2 97 %. ?   ?   ? ?Intake/Output Summary (Last 24 hours) at 07/10/2021 1009 ?Last data filed at 07/10/2021 0418 ?Gross per 24 hour  ?Intake 2883.37 ml  ?Output 4651 ml  ?Net -1767.63 ml  ? ?Filed Weights  ? 07/11/2021 1104 07/09/21 0100 07/10/21 0044  ?Weight: 71.1 kg 71.9 kg 70.1 kg  ? ? ?Examination: ?General: Frail cachectic male no acute distress ?HEENT: MM pink/moist no JVD is appreciated ?Neuro: Grossly intact without focal defect ?CV: Heart sounds are distant ?PULM: Mild rhonchi ?Currently on 10 L nasal cannula increased to 12 for sats of 84% ? ?GI: soft, bsx4 active  ?GU: ?Extremities: warm/dry, negative edema  ?Skin: no rashes or lesions ? ? ?Intake/Output Summary (Last 24 hours) at 07/10/2021 1011 ?Last data filed at 07/10/2021 0418 ?Gross per 24 hour  ?Intake 2883.37 ml  ?Output 4651 ml  ?Net -1767.63 ml  ?  ?Lab Results  ?Component Value Date  ? CREATININE 1.20 07/10/2021  ? CREATININE 1.04 07/09/2021  ? CREATININE 1.25 (H) 07/20/2021  ? ? ?Resolved Hospital Problem list   ? ? ?Assessment & Plan:  ?Acute on chronic hypoxemic respiratory failure- with background of IPF, worsening R sided haziness.  Viral studies neg, MRSA PCR neg.  Aspiration vs. AE-IPF vs. Pulmonary edema vs. CAP vs. Some combination. ? ?Procalcitonin is 0.50 therefore consider DC on cefepime ?Noted good response to diuresis with stable creatinine ?Continue IV steroid ?Added pulmonary toilet with incentive spirometer and flutter valve ?Currently on 12 L nasal cannula ?Guarded prognosis ?No significant change in chest x-ray ? ? ?Richardson Landry Macen Joslin ACNP ?Acute Care Nurse Practitioner ?[place] ?Please consult Amion ?07/10/2021, 10:09 AM ? ?  ?

## 2021-07-10 NOTE — Progress Notes (Signed)
?PROGRESS NOTE ? ?Eduardo KIMMONS SJG:[id number] DOB: [birth year] DOA: 07/11/2021 ?PCP: Lujean Amel, MD ? ? LOS: 2 days  ? ?Brief Narrative / Interim history: ?71 year old male with HTN, pulmonary fibrosis on 8 L with activities at home, prostate cancer, comes into the hospital with shortness of breath.  He has been having IPF for a number of years, but over the last several months he became worse, and started on oxygen about 3 months ago.  He has been on 2 to 3 L, but when seen by pulmonology last Friday he was decreased to 8 L with exertion.  He continued to progress getting worse at home and eventually came to the hospital.  Sats were at 60% despite nasal cannula.  Pulmonary consulted on admission ? ?Subjective / 24h Interval events: ?Not sure his breathing is much better after diuresis ? ?Assesement and Plan: ?Principal Problem: ?  Chronic respiratory failure with hypoxia ([place]) ?Active Problems: ?  Major depressive disorder, recurrent episode ([place]) ?  Panic disorder with agoraphobia ?  IPF (idiopathic pulmonary fibrosis) ([place]) ?  Paroxysmal atrial fibrillation ([place]) CHA2VASC2 = 3; on Eliiquis ?  Prolonged QT interval ? ? ? ?Acute on chronic respiratory failure with hypoxia ?-concern is about apparent progression of his IPF.  Over the last few months it looks like his becoming more more hypoxic and most recently requiring up to 8 L with exertion.  Imaging on admission showed possible pneumonia.  Broad-spectrum antibiotics were added, pulmonary was consulted and he was also added on steroids.   ? ?Pulmonary fibrosis ?-as above, pulm following.  Continue Ofec (wife to bring), continue steroids, nebulizers.  ?-poor overall prognosis ?-diuresed well with IV lasix x3 ? ?Prolonged QTc ?-monitor ? ?Essential hypertension ?-continue diltiazem ? ?Hyperlipidemia ?-continue statin ? ?Depression/anxiety ?-continue Klonopin ? ?Paroxysmal A-fib  ?-continue diltiazem, Eliquis ? ? ? ?Scheduled Meds: ? apixaban  5 mg Oral BID  ?  clonazePAM  1 mg Oral BID  ? diltiazem  120 mg Oral q AM  ? docusate sodium  100 mg Oral BID  ? gabapentin  600 mg Oral BID  ? methylPREDNISolone (SOLU-MEDROL) injection  80 mg Intravenous Q12H  ? Nintedanib  150 mg Oral BID  ? pantoprazole  40 mg Oral QHS  ? psyllium  1 packet Oral QHS  ? rosuvastatin  20 mg Oral Daily  ? sodium chloride flush  3 mL Intravenous Q12H  ? ?Continuous Infusions: ? sodium chloride 75 mL/hr at 07/10/21 0433  ? ceFEPime (MAXIPIME) IV 2 g (07/10/21 1020)  ? ?PRN Meds:.acetaminophen **OR** acetaminophen, bisacodyl, guaiFENesin, hydrALAZINE, HYDROcodone-acetaminophen, levalbuterol, morphine injection, polyethylene glycol ? ?Diet Orders (From admission, onward)  ? ?  Start     Ordered  ? 07/09/21 1016  DIET DYS 3 Room service appropriate? Yes; Fluid consistency: Thin  Diet effective now       ?Question Answer Comment  ?Room service appropriate? Yes   ?Fluid consistency: Thin   ?  ? 07/09/21 1016  ? ?  ?  ? ?  ? ? ?DVT prophylaxis:  ?apixaban (ELIQUIS) tablet 5 mg  ? ?Lab Results  ?Component Value Date  ? PLT 203 07/09/2021  ? ? ?  Code Status: Partial Code ? ?Family Communication: no family at bedside  ? ?Status is: Inpatient ? ?Remains inpatient appropriate because: HFNC ? ?Level of care: Progressive ? ?Consultants:  ?Pulmonary  ? ? ? ?Antimicrobials: ?Vanc / Cefepime 5/15 >>  ? ? ?Objective: ?Vitals:  ? 07/10/21 0647 07/10/21 6546 07/10/21  0700 07/10/21 0745  ?BP:    121/79  ?Pulse: 75 78 73 73  ?Resp:   20 18  ?Temp:    97.9 ?F (36.6 ?C)  ?TempSrc:    Oral  ?SpO2: 97% 99% 97% 97%  ?Weight:      ?Height:      ? ? ?Intake/Output Summary (Last 24 hours) at 07/10/2021 1154 ?Last data filed at 07/10/2021 0418 ?Gross per 24 hour  ?Intake 2883.37 ml  ?Output 3801 ml  ?Net -917.63 ml  ? ?Wt Readings from Last 3 Encounters:  ?07/10/21 70.1 kg  ?07/05/21 72.8 kg  ?06/21/21 71 kg  ? ? ?Examination: ? ? ?General: Appearance:    elderly male in no acute distress  ?   ?Lungs:     On HFNC, respirations  unlabored  ?Heart:    Normal heart rate.   ?MS:   All extremities are intact.   ?Neurologic:   Awake, alert, oriented x 3. No apparent focal neurological           defect.   ?  ? ? ?Data Reviewed: I have independently reviewed following labs and imaging studies  ? ?CBC ?Recent Labs  ?Lab 07/01/2021 ?0703 07/14/2021 ?0716 07/09/21 ?0450  ?WBC 14.9*  --  8.6  ?HGB 13.0 14.6 11.7*  ?HCT 39.0 43.0 35.8*  ?PLT 233  --  203  ?MCV 99.2  --  98.6  ?MCH 33.1  --  32.2  ?MCHC 33.3  --  32.7  ?RDW 15.4  --  15.3  ? ? ?Recent Labs  ?Lab 06/25/2021 ?0703 07/11/2021 ?0716 07/23/2021 ?1111 07/09/21 ?0450 07/10/21 ?0154  ?NA 137 139  --  137 138  ?K 3.5 3.5  --  4.5 4.1  ?CL 100  --   --  103 96*  ?CO2 28  --   --  25 33*  ?GLUCOSE 100*  --   --  148* 141*  ?BUN 21  --   --  20 31*  ?CREATININE 1.25*  --   --  1.04 1.20  ?CALCIUM 9.0  --   --  8.8* 8.8*  ?MG  --   --  1.7  --  2.0  ?PROCALCITON  --   --   --  0.50  --   ?BNP 1,048.1*  --   --   --   --   ? ? ?------------------------------------------------------------------------------------------------------------------ ?No results for input(s): CHOL, HDL, LDLCALC, TRIG, CHOLHDL, LDLDIRECT in the last 72 hours. ? ?No results found for: HGBA1C ?------------------------------------------------------------------------------------------------------------------ ?No results for input(s): TSH, T4TOTAL, T3FREE, THYROIDAB in the last 72 hours. ? ?Invalid input(s): FREET3 ? ?Cardiac Enzymes ?No results for input(s): CKMB, TROPONINI, MYOGLOBIN in the last 168 hours. ? ?Invalid input(s): CK ?------------------------------------------------------------------------------------------------------------------ ?   ?Component Value Date/Time  ? BNP 1,048.1 (H) 07/07/2021 0703  ? ? ?CBG: ?No results for input(s): GLUCAP in the last 168 hours. ? ?Recent Results (from the past 240 hour(s))  ?Resp Panel by RT-PCR (Flu A&B, Covid) Nasopharyngeal Swab     Status: None  ? Collection Time: 07/13/2021  7:45 AM  ?  Specimen: Nasopharyngeal Swab; Nasopharyngeal(NP) swabs in vial transport medium  ?Result Value Ref Range Status  ? SARS Coronavirus 2 by RT PCR NEGATIVE NEGATIVE Final  ?  Comment: (NOTE) ?SARS-CoV-2 target nucleic acids are NOT DETECTED. ? ?The SARS-CoV-2 RNA is generally detectable in upper respiratory ?specimens during the acute phase of infection. The lowest ?concentration of SARS-CoV-2 viral copies this assay can detect is ?138 copies/mL.  A negative result does not preclude SARS-Cov-2 ?infection and should not be used as the sole basis for treatment or ?other patient management decisions. A negative result may occur with  ?improper specimen collection/handling, submission of specimen other ?than nasopharyngeal swab, presence of viral mutation(s) within the ?areas targeted by this assay, and inadequate number of viral ?copies(<138 copies/mL). A negative result must be combined with ?clinical observations, patient history, and epidemiological ?information. The expected result is Negative. ? ?Fact Sheet for Patients:  ?EntrepreneurPulse.com.au ? ?Fact Sheet for Healthcare Providers:  ?IncredibleEmployment.be ? ?This test is no t yet approved or cleared by the [place] FDA and  ?has been authorized for detection and/or diagnosis of SARS-CoV-2 by ?FDA under an Emergency Use Authorization (EUA). This EUA will remain  ?in effect (meaning this test can be used) for the duration of the ?COVID-19 declaration under Section 564(b)(1) of the Act, 21 ?U.S.C.section 360bbb-3(b)(1), unless the authorization is terminated  ?or revoked sooner.  ? ? ?  ? Influenza A by PCR NEGATIVE NEGATIVE Final  ? Influenza B by PCR NEGATIVE NEGATIVE Final  ?  Comment: (NOTE) ?The Xpert Xpress SARS-CoV-2/FLU/RSV plus assay is intended as an aid ?in the diagnosis of influenza from Nasopharyngeal swab specimens and ?should not be used as a sole basis for treatment. Nasal washings and ?aspirates are  unacceptable for Xpert Xpress SARS-CoV-2/FLU/RSV ?testing. ? ?Fact Sheet for Patients: ?EntrepreneurPulse.com.au ? ?Fact Sheet for Healthcare Providers: ?IncredibleEmployment.be ?

## 2021-07-10 NOTE — Progress Notes (Signed)
Assigned Nurse Tech notified RN pt diaphoretic and oxygen saturation desat. To 87% while on 10L HFNC while pt sleeping.  ? ?RN arrived at the bedside. Pt resting in bed without distress. Pt alert and oriented. Oxygen saturation 97% on 10L HFNC. Rise and fall of chest noted. Pt NSR on the monitor. Pt denies coughing up blood recently. Call bell within reach. Pt denies any further questions or concerns at this time.  ? ? 07/10/21 0135  ?Vitals  ?Temp 98 ?F (36.7 ?C)  ?Temp Source Oral  ?BP 125/89  ?MAP (mmHg) 100  ?BP Location Right Arm  ?BP Method Automatic  ?Patient Position (if appropriate) Lying  ?Pulse Rate 78  ?Pulse Rate Source Monitor  ?ECG Heart Rate 78  ?Resp 20  ?MEWS COLOR  ?MEWS Score Color Green  ?Oxygen Therapy  ?SpO2 97 %  ?O2 Device HFNC  ?O2 Flow Rate (L/min) 10 L/min  ?MEWS Score  ?MEWS Temp 0  ?MEWS Systolic 0  ?MEWS Pulse 0  ?MEWS RR 0  ?MEWS LOC 0  ?MEWS Score 0  ? ?

## 2021-07-11 DIAGNOSIS — J9611 Chronic respiratory failure with hypoxia: Secondary | ICD-10-CM | POA: Diagnosis not present

## 2021-07-11 DIAGNOSIS — J189 Pneumonia, unspecified organism: Secondary | ICD-10-CM

## 2021-07-11 LAB — CBC
HCT: 35.2 % — ABNORMAL LOW (ref 39.0–52.0)
Hemoglobin: 11.5 g/dL — ABNORMAL LOW (ref 13.0–17.0)
MCH: 32.3 pg (ref 26.0–34.0)
MCHC: 32.7 g/dL (ref 30.0–36.0)
MCV: 98.9 fL (ref 80.0–100.0)
Platelets: 297 10*3/uL (ref 150–400)
RBC: 3.56 MIL/uL — ABNORMAL LOW (ref 4.22–5.81)
RDW: 14.7 % (ref 11.5–15.5)
WBC: 13.7 10*3/uL — ABNORMAL HIGH (ref 4.0–10.5)
nRBC: 0 % (ref 0.0–0.2)

## 2021-07-11 MED ORDER — METHYLPREDNISOLONE SODIUM SUCC 40 MG IJ SOLR
40.0000 mg | Freq: Two times a day (BID) | INTRAMUSCULAR | Status: DC
  Administered 2021-07-11: 40 mg via INTRAVENOUS
  Filled 2021-07-11 (×2): qty 1

## 2021-07-11 MED ORDER — ACETAMINOPHEN 325 MG PO TABS: 650.0000 mg | ORAL_TABLET | Freq: Once | ORAL | Status: DC

## 2021-07-11 NOTE — Care Management Important Message (Signed)
Important Message  Patient Details  Name: Eduardo Fuentes MRN: [id number] Date of Birth: [birth year]   Medicare Important Message Given:  Yes     Shelda Altes 07/11/2021, 8:05 AM

## 2021-07-11 NOTE — Progress Notes (Signed)
Oxygen desat; RN arrived to bedside. Pt removed nasal cannula from nostrils; in pt's mouth instead. Per pt, woke up confused and thought it was pt's nightguard for grinding teeth. Pt reoriented. Oxygen saturation 95% on 10L HFNC. Pt aware and verbalize understanding teaching. Call bell within reach. Pt denies any further questions or concerns.

## 2021-07-11 NOTE — Progress Notes (Signed)
PROGRESS NOTE  Eduardo Fuentes TZG:[id number] DOB: 1950-08-30 DOA: 07/04/2021 PCP: Lujean Amel, MD   LOS: 3 days   Brief Narrative / Interim history: 71 year old male with HTN, pulmonary fibrosis on 8 L with activities at home, prostate cancer, comes into the hospital with shortness of breath.  He has been having IPF for a number of years, but over the last several months he became worse, and started on oxygen about 3 months ago.  He has been on 2 to 3 L, but when seen by pulmonology last Friday he was decreased to 8 L with exertion.  He continued to progress getting worse at home and eventually came to the hospital.  Sats were at 60% despite nasal cannula.  Pulmonary consulted on admission  Subjective / 24h Interval events: Sitting in chair  Assesement and Plan: Principal Problem:   Chronic respiratory failure with hypoxia ([place]) Active Problems:   Major depressive disorder, recurrent episode (HCC)   Panic disorder with agoraphobia   IPF (idiopathic pulmonary fibrosis) (HCC)   Paroxysmal atrial fibrillation (HCC) CHA2VASC2 = 3; on Eliiquis   Prolonged QT interval   Community acquired pneumonia    Acute on chronic respiratory failure with hypoxia -concern is about apparent progression of his IPF.  Over the last few months it looks like his becoming more more hypoxic and most recently requiring up to 8 L with exertion.  Imaging on admission showed possible pneumonia.  Broad-spectrum antibiotics were added, pulmonary was consulted and he was also added on steroids.    Pulmonary fibrosis -as above, pulm following.  Continue Ofec (wife to bring), continue steroids, nebulizers.  -poor overall prognosis -diuresed well with IV lasix x3 -will use PRN -repeat x ray in AM -palliative care to re-visit on 5/19  Prolonged QTc -monitor  Essential hypertension -continue diltiazem  Hyperlipidemia -continue statin  Depression/anxiety -continue Klonopin  Paroxysmal A-fib  -continue  diltiazem, Eliquis    Scheduled Meds:  apixaban  5 mg Oral BID   clonazePAM  1 mg Oral BID   diltiazem  120 mg Oral q AM   docusate sodium  100 mg Oral BID   gabapentin  600 mg Oral BID   methylPREDNISolone (SOLU-MEDROL) injection  80 mg Intravenous Q12H   Nintedanib  150 mg Oral BID   pantoprazole  40 mg Oral QHS   psyllium  1 packet Oral QHS   rosuvastatin  20 mg Oral Daily   sodium chloride flush  3 mL Intravenous Q12H   Continuous Infusions:  ceFEPime (MAXIPIME) IV 2 g (07/11/21 1115)   PRN Meds:.acetaminophen **OR** acetaminophen, bisacodyl, guaiFENesin, hydrALAZINE, HYDROcodone-acetaminophen, levalbuterol, morphine injection, polyethylene glycol  Diet Orders (From admission, onward)     Start     Ordered   07/09/21 1016  DIET DYS 3 Room service appropriate? Yes; Fluid consistency: Thin  Diet effective now       Question Answer Comment  Room service appropriate? Yes   Fluid consistency: Thin      07/09/21 1016            DVT prophylaxis:  apixaban (ELIQUIS) tablet 5 mg   Lab Results  Component Value Date   PLT 297 07/11/2021      Code Status: Partial Code  Family Communication: wife at bedside  Status is: Inpatient  Remains inpatient appropriate because: HFNC  Level of care: Progressive  Consultants:  Pulmonary     Antimicrobials: Vanc / Cefepime 5/15 >>    Objective: Vitals:   07/11/21 0720 07/11/21  0725 07/11/21 0726 07/11/21 1140  BP:   129/81 116/78  Pulse: 62 63 63 69  Resp: '19 17 20 19  '$ Temp:   97.6 F (36.4 C) (!) 97.4 F (36.3 C)  TempSrc:   Oral Oral  SpO2: 100% 100% 100% 97%  Weight:      Height:        Intake/Output Summary (Last 24 hours) at 07/11/2021 1236 Last data filed at 07/11/2021 0845 Gross per 24 hour  Intake 1938.35 ml  Output 1550 ml  Net 388.35 ml   Wt Readings from Last 3 Encounters:  07/11/21 71.1 kg  07/05/21 72.8 kg  06/21/21 71 kg    Examination:   General: Appearance:    Well developed,  well nourished male in no acute distress     Lungs:      respirations unlabored, on [place], diminished  Heart:    Normal heart rate.   MS:   All extremities are intact.   Neurologic:   Awake, alert     Data Reviewed: I have independently reviewed following labs and imaging studies   CBC Recent Labs  Lab 07/19/2021 0703 06/29/2021 0716 07/09/21 0450 07/11/21 0404  WBC 14.9*  --  8.6 13.7*  HGB 13.0 14.6 11.7* 11.5*  HCT 39.0 43.0 35.8* 35.2*  PLT 233  --  203 297  MCV 99.2  --  98.6 98.9  MCH 33.1  --  32.2 32.3  MCHC 33.3  --  32.7 32.7  RDW 15.4  --  15.3 14.7    Recent Labs  Lab 07/14/2021 0703 06/24/2021 0716 06/30/2021 1111 07/09/21 0450 07/10/21 0154  NA 137 139  --  137 138  K 3.5 3.5  --  4.5 4.1  CL 100  --   --  103 96*  CO2 28  --   --  25 33*  GLUCOSE 100*  --   --  148* 141*  BUN 21  --   --  20 31*  CREATININE 1.25*  --   --  1.04 1.20  CALCIUM 9.0  --   --  8.8* 8.8*  MG  --   --  1.7  --  2.0  PROCALCITON  --   --   --  0.50  --   BNP 1,048.1*  --   --   --   --     ------------------------------------------------------------------------------------------------------------------ No results for input(s): CHOL, HDL, LDLCALC, TRIG, CHOLHDL, LDLDIRECT in the last 72 hours.  No results found for: HGBA1C ------------------------------------------------------------------------------------------------------------------ No results for input(s): TSH, T4TOTAL, T3FREE, THYROIDAB in the last 72 hours.  Invalid input(s): FREET3  Cardiac Enzymes No results for input(s): CKMB, TROPONINI, MYOGLOBIN in the last 168 hours.  Invalid input(s): CK ------------------------------------------------------------------------------------------------------------------    Component Value Date/Time   BNP 1,048.1 (H) 07/14/2021 0703    CBG: No results for input(s): GLUCAP in the last 168 hours.  Recent Results (from the past 240 hour(s))  Resp Panel by RT-PCR (Flu A&B, Covid)  Nasopharyngeal Swab     Status: None   Collection Time: 07/16/2021  7:45 AM   Specimen: Nasopharyngeal Swab; Nasopharyngeal(NP) swabs in vial transport medium  Result Value Ref Range Status   SARS Coronavirus 2 by RT PCR NEGATIVE NEGATIVE Final    Comment: (NOTE) SARS-CoV-2 target nucleic acids are NOT DETECTED.  The SARS-CoV-2 RNA is generally detectable in upper respiratory specimens during the acute phase of infection. The lowest concentration of SARS-CoV-2 viral copies this assay can detect is 138  copies/mL. A negative result does not preclude SARS-Cov-2 infection and should not be used as the sole basis for treatment or other patient management decisions. A negative result may occur with  improper specimen collection/handling, submission of specimen other than nasopharyngeal swab, presence of viral mutation(s) within the areas targeted by this assay, and inadequate number of viral copies(<138 copies/mL). A negative result must be combined with clinical observations, patient history, and epidemiological information. The expected result is Negative.  Fact Sheet for Patients:  EntrepreneurPulse.com.au  Fact Sheet for Healthcare Providers:  IncredibleEmployment.be  This test is no t yet approved or cleared by the [place] FDA and  has been authorized for detection and/or diagnosis of SARS-CoV-2 by FDA under an Emergency Use Authorization (EUA). This EUA will remain  in effect (meaning this test can be used) for the duration of the COVID-19 declaration under Section 564(b)(1) of the Act, 21 U.S.C.section 360bbb-3(b)(1), unless the authorization is terminated  or revoked sooner.       Influenza A by PCR NEGATIVE NEGATIVE Final   Influenza B by PCR NEGATIVE NEGATIVE Final    Comment: (NOTE) The Xpert Xpress SARS-CoV-2/FLU/RSV plus assay is intended as an aid in the diagnosis of influenza from Nasopharyngeal swab specimens and should not be  used as a sole basis for treatment. Nasal washings and aspirates are unacceptable for Xpert Xpress SARS-CoV-2/FLU/RSV testing.  Fact Sheet for Patients: EntrepreneurPulse.com.au  Fact Sheet for Healthcare Providers: IncredibleEmployment.be  This test is not yet approved or cleared by the [place] FDA and has been authorized for detection and/or diagnosis of SARS-CoV-2 by FDA under an Emergency Use Authorization (EUA). This EUA will remain in effect (meaning this test can be used) for the duration of the COVID-19 declaration under Section 564(b)(1) of the Act, 21 U.S.C. section 360bbb-3(b)(1), unless the authorization is terminated or revoked.  Performed at [facility], [place] [address], [place], [place] [address]   Expectorated Sputum Assessment w Gram Stain, Rflx to Resp Cult     Status: None   Collection Time: 07/17/2021 10:25 AM   Specimen: Sputum  Result Value Ref Range Status   Specimen Description SPUTUM  Final   Special Requests NONE  Final   Sputum evaluation   Final    Sputum specimen not acceptable for testing.  Please recollect.   Gram Stain Report Called to,Read Back By and Verified With: RN AMancel Bale [phone] '@[phone]'$  FH Performed at [facility], [place] [address], [place], [place] [address]    Report Status 06/25/2021 FINAL  Final  MRSA Next Gen by PCR, Nasal     Status: None   Collection Time: 07/15/2021  5:42 PM   Specimen: Nasal Mucosa; Nasal Swab  Result Value Ref Range Status   MRSA by PCR Next Gen NOT DETECTED NOT DETECTED Final    Comment: (NOTE) The GeneXpert MRSA Assay (FDA approved for NASAL specimens only), is one component of a comprehensive MRSA colonization surveillance program. It is not intended to diagnose MRSA infection nor to guide or monitor treatment for MRSA infections. Test performance is not FDA approved in patients less than 51 years old. Performed at [facility], [place] [address],  [place], [place] [address]      Radiology Studies: No results found.   Eulogio Bear DO [facility]  Between 7 am - 7 pm I am available, please contact me via Amion (for emergencies) or Securechat (non urgent messages)  Between 7 pm - 7 am  I am not available, please contact night coverage MD/APP via Amion

## 2021-07-11 NOTE — Plan of Care (Signed)
  Problem: Activity: Goal: Ability to tolerate increased activity will improve Outcome: Progressing   Problem: Clinical Measurements: Goal: Ability to maintain a body temperature in the normal range will improve Outcome: Progressing   Problem: Respiratory: Goal: Ability to maintain adequate ventilation will improve Outcome: Progressing Goal: Ability to maintain a clear airway will improve Outcome: Progressing   

## 2021-07-11 NOTE — Evaluation (Signed)
Physical Therapy Evaluation Patient Details Name: Eduardo Fuentes MRN: [id number] DOB: 07-18-1950 Today's Date: 07/11/2021  History of Present Illness  Pt is a 71 y.o. male who presented 07/16/2021 with SOB and hypoxia to the 50s. Pt admitted with acute on chronic hypoxemia respiratory failure ;ikely secondary to PNA vs IFP exacerbation. PMH: coronary artery calcification, afib, IPF, prostate CA, hyperlipidemia, GERD with sliding hiatal hernia, GAD, panic disorder with agoraphobia   Clinical Impression  Pt presents with condition above and deficits mentioned below, see PT Problem List. PTA, he was IND without DME, living with his wife in a 1-level house with 4 STE. Pt is on 3L O2 at rest and 8L O2 with mobility at baseline. Upon arrival, pt was on 10L O2. Increased O2 to 15L for mobility with his sats decreasing to as low as 69%, needing >5 min with a seated rest break on 15L to rebound sats to >/= 90%. Pt is primarily limited in mobility by his poor pulmonary function. Pt also displays deficits in lower extremity functional strength, balance, and activity tolerance. He is at risk for falls, needing minA to prevent LOB when turning with a RW today. He would benefit from further acute PT and follow-up with HHPT to maximize his return to baseline.       Recommendations for follow up therapy are one component of a multi-disciplinary discharge planning process, led by the attending physician.  Recommendations may be updated based on patient status, additional functional criteria and insurance authorization.  Follow Up Recommendations Home health PT    Assistance Recommended at Discharge Intermittent Supervision/Assistance  Patient can return home with the following  A little help with walking and/or transfers;Help with stairs or ramp for entrance;Assist for transportation;Assistance with cooking/housework;A little help with bathing/dressing/bathroom    Equipment Recommendations Rollator (4  wheels);Other (comment) (tub bench)  Recommendations for Other Services       Functional Status Assessment Patient has had a recent decline in their functional status and demonstrates the ability to make significant improvements in function in a reasonable and predictable amount of time.     Precautions / Restrictions Precautions Precautions: Fall;Other (comment) Precaution Comments: watch SpO2 Restrictions Weight Bearing Restrictions: No      Mobility  Bed Mobility Overal bed mobility: Needs Assistance Bed Mobility: Supine to Sit     Supine to sit: Min guard, HOB elevated     General bed mobility comments: Extra time to push up to ascend trunk from elevated HOB and pivot hips to EOB, min guard for safety    Transfers Overall transfer level: Needs assistance Equipment used: Rolling walker (2 wheels) Transfers: Sit to/from Stand Sit to Stand: Min guard           General transfer comment: Cues for hand placement, min guard for safety, no LOB    Ambulation/Gait Ambulation/Gait assistance: Min guard, Min assist Gait Distance (Feet): 40 Feet Assistive device: Rolling walker (2 wheels) Gait Pattern/deviations: Step-through pattern, Decreased stride length, Trunk flexed Gait velocity: reduced Gait velocity interpretation: <1.31 ft/sec, indicative of household ambulator   General Gait Details: Pt with slow gait, needing cues to slow further to decrease SOB as needed. Min guard for safety with straight gait path, but minA due to trunk sway when turning with RW. SpO2 dropping to 69% on 15L, thus returned to chair, needing >5 min to recover to 90% on 15L  Stairs            Wheelchair Mobility    Modified  Rankin (Stroke Patients Only)       Balance Overall balance assessment: Needs assistance Sitting-balance support: No upper extremity supported, Feet supported Sitting balance-Leahy Scale: Good     Standing balance support: Bilateral upper extremity  supported, During functional activity, Reliant on assistive device for balance Standing balance-Leahy Scale: Poor Standing balance comment: Reliant on RW and up to minA when turning                             Pertinent Vitals/Pain Pain Assessment Pain Assessment: Faces Faces Pain Scale: Hurts a little bit Pain Location: grimacing with coughing Pain Descriptors / Indicators: Grimacing Pain Intervention(s): Limited activity within patient's tolerance, Monitored during session, Repositioned    Home Living Family/patient expects to be discharged to:: Private residence Living Arrangements: Spouse/significant other Available Help at Discharge: Family;Available 24 hours/day (wife can assist some, but not a lot) Type of Home: House Home Access: Stairs to enter Entrance Stairs-Rails: Left (ascending) Entrance Stairs-Number of Steps: 4   Home Layout: One level Home Equipment: Cane - single point;Wheelchair - manual Additional Comments: Uses 3L O2 at rest, 8L O2 with mobility. Niece and sister also can assist on occasion    Prior Function Prior Level of Function : Independent/Modified Independent             Mobility Comments: Did not use AD. Denies any falls. ADLs Comments: Reports increased difficulty standing long enough for ADLs at sink. Wife does cleaning and cooking.     Hand Dominance        Extremity/Trunk Assessment   Upper Extremity Assessment Upper Extremity Assessment: Defer to OT evaluation    Lower Extremity Assessment Lower Extremity Assessment: Generalized weakness (noted functionally; denied numbness/tingling bil; MMT scores of 4+ grossly bil)    Cervical / Trunk Assessment Cervical / Trunk Assessment: Kyphotic  Communication   Communication: No difficulties  Cognition Arousal/Alertness: Awake/alert Behavior During Therapy: WFL for tasks assessed/performed Overall Cognitive Status: Within Functional Limits for tasks assessed                                           General Comments General comments (skin integrity, edema, etc.): SpO2 dropping to 69% on 15L O2 when mobilizing, needing >5 min on 15L with seated rest to rebound to 90s%, reduced O2 to 10L (was on 10L upon arrival) with SpO2 remaining >/= 89%; SpO2 would drop each time pt had coughing fit    Exercises     Assessment/Plan    PT Assessment Patient needs continued PT services  PT Problem List Decreased strength;Decreased activity tolerance;Decreased mobility;Decreased balance;Decreased knowledge of use of DME;Cardiopulmonary status limiting activity       PT Treatment Interventions DME instruction;Gait training;Stair training;Therapeutic activities;Functional mobility training;Therapeutic exercise;Neuromuscular re-education;Balance training;Patient/family education    PT Goals (Current goals can be found in the Care Plan section)  Acute Rehab PT Goals Patient Stated Goal: to improve his pulmonary function PT Goal Formulation: With patient Time For Goal Achievement: 07/31/21 Potential to Achieve Goals: Good    Frequency Min 3X/week     Co-evaluation               AM-PAC PT "6 Clicks" Mobility  Outcome Measure Help needed turning from your back to your side while in a flat bed without using bedrails?: A Little Help needed moving from lying  on your back to sitting on the side of a flat bed without using bedrails?: A Little Help needed moving to and from a bed to a chair (including a wheelchair)?: A Little Help needed standing up from a chair using your arms (e.g., wheelchair or bedside chair)?: A Little Help needed to walk in hospital room?: A Little Help needed climbing 3-5 steps with a railing? : A Little 6 Click Score: 18    End of Session Equipment Utilized During Treatment: Oxygen Activity Tolerance: Other (comment) (limited by drop in sats) Patient left: in chair;with call bell/phone within reach;with chair alarm set Nurse  Communication: Mobility status;Other (comment) (sats) PT Visit Diagnosis: Unsteadiness on feet (R26.81);Other abnormalities of gait and mobility (R26.89);Muscle weakness (generalized) (M62.81);Difficulty in walking, not elsewhere classified (R26.2)    Time: 6979-4801 PT Time Calculation (min) (ACUTE ONLY): 39 min   Charges:   PT Evaluation $PT Eval Moderate Complexity: 1 Mod PT Treatments $Gait Training: 8-22 mins $Therapeutic Activity: 8-22 mins        Moishe Spice, PT, DPT Acute Rehabilitation Services  Pager: [phone] Office: [phone]   Orvan Falconer 07/11/2021, 9:55 AM

## 2021-07-11 NOTE — Progress Notes (Signed)
Mobility Specialist Progress Note:   07/11/21 1340  Mobility  Activity Transferred from chair to bed  Level of Assistance Minimal assist, patient does 75% or more  Assistive Device Front wheel walker  Distance Ambulated (ft) 2 ft  Activity Response Tolerated well  $Mobility charge 1 Mobility   Pt requesting to transfer back to bed from chair. Required minA to transfer. SpO2 88-92% throughout transfer. Left in bed with all needs met.   Eduardo Fuentes Acute Rehab Secure Chat or Office Phone: [phone]

## 2021-07-11 NOTE — TOC Initial Note (Signed)
Transition of Care [facility]) - Initial/Assessment Note    Patient Details  Name: Eduardo Fuentes MRN: [id number] Date of Birth: [birth year]  Transition of Care [facility]) CM/SW Contact:    Zenon Mayo, RN Phone Number: 07/11/2021, 11:27 AM  Clinical Narrative:                 NCM offered choice to patient at bedside with wife, he chose [facility], NCM left message with Corene Cornea awaiting to hear back regarding HHPT.  Patient states he would like a rollator and a shower chair, he will pay [employer] out of pocket for the shower chair.  Per Corene Cornea with [facility] they can take the referral for [place], [place].  Soc will begin 24 to 48 hrs post dc. Possible for dc in couple days.  Expected Discharge Plan: [place] Barriers to Discharge: Continued Medical Work up   Patient Goals and CMS Choice Patient states their goals for this hospitalization and ongoing recovery are:: return home with wife CMS Medicare.gov Compare Post Acute Care list provided to:: Patient Choice offered to / list presented to : Patient  Expected Discharge Plan and Services Expected Discharge Plan: [place]   Discharge Planning Services: CM Consult Post Acute Care Choice: [place] arrangements for the past 2 months: Single Family Home                                      Prior Living Arrangements/Services Living arrangements for the past 2 months: Single Family Home Lives with:: Roommate, Spouse                   Activities of Daily Living      Permission Sought/Granted                  Emotional Assessment              Admission diagnosis:  SOB (shortness of breath) [R06.02] Hypoxia [R09.02] Acute on chronic respiratory failure with hypoxia ([place]) [J96.21] Community acquired pneumonia, unspecified laterality [J18.9] Patient Active Problem List   Diagnosis Date Noted   Community acquired pneumonia    Physical deconditioning 07/09/2021   Chronic  respiratory failure with hypoxia ([place]) 07/23/2021   Prolonged QT interval 07/17/2021   Acute respiratory failure with hypoxemia ([place]) 05/29/2021   Shortness of breath 05/29/2021   Acute respiratory failure with hypoxia ([place]) 05/29/2021   Elevated troponin 05/29/2021   (HFpEF) heart failure with preserved ejection fraction ([place]) 05/29/2021   Atrial fibrillation, chronic ([place]) 05/29/2021   AKI (acute kidney injury) ([place]) 05/29/2021   Secondary hypercoagulable state ([place]) 04/03/2021   Dyslipidemia 08/29/2020   Paroxysmal atrial fibrillation ([place]) CHA2VASC2 = 3; on Eliiquis 05/16/2020   Current use of long term anticoagulation 05/16/2020   Therapeutic drug monitoring 08/31/2019   Coronary artery calcification seen on CAT scan 05/06/2019   Counseled about COVID-19 virus infection 01/17/2019   IPF (idiopathic pulmonary fibrosis) ([place]) 02/09/2018   Degeneration of lumbar intervertebral disc 10/21/2017   Lumbar radiculopathy 10/21/2017   Prostate CA ([place]) 09/03/2012   Major depressive disorder, recurrent episode ([place]) 10/29/2011   Generalized anxiety disorder 10/29/2011   Panic disorder with agoraphobia 10/29/2011   Nausea 05/09/2011   Heartburn 05/09/2011   Dizziness 05/09/2011   PCP:  Lujean Amel, MD Pharmacy:   [place], [place]  [place] [address] Phone: [phone] Fax: [place], [address] [place] [address] Phone: [phone] Fax: [phone]     Social Determinants of Health (SDOH) Interventions    Readmission Risk Interventions    07/11/2021   10:40 AM  Readmission Risk Prevention Plan  Transportation Screening Complete  PCP or Specialist Appt within 3-5 Days Complete  HRI or [place] Complete  Social Work Consult for [place] Planning/Counseling  Complete  Palliative Care Screening Complete  Medication Review Press photographer) Complete

## 2021-07-11 NOTE — Progress Notes (Signed)
NAME:  Eduardo Fuentes, MRN:  [id number], DOB:  09/25/1950, LOS: 3 ADMISSION DATE:  07/12/2021, CONSULTATION DATE:  07/12/2021 REFERRING MD:  Masters, CHIEF COMPLAINT:  Hypoxemia, IPF, Pneumonia, Heart Failure   History of Present Illness:  71 year old male, never smoker. PMH significant for coronary artery calcification, afib, IPF, prostate CA, hyperlipidemia, GERD with sliding hiatal hernia, GAD, panic disorder with agoraphobia. Maintenance of Ofev. Pt. Presents to the ED 07/18/2021 after 2 days of worsening dyspnea than his baseline. Quick onset, started Friday evening 5/12 and became progressively worse Saturday and Sunday .  He stated he had not increased his oxygen to 8 L as recommended due to the lacation of the concentrator in his home.  In the ED he arrived with sats in the 50's. They quickly rebounded with NRB to upper 90's. RR is still 20. CXR revealed increased opacification in the right lung suspicious for pneumonia. He has endorsed chills, but no fever, WBC was  14.9, BNP 1048.1, Initial ABG 7.493/ 43/77/33.2/TCO2 of 35, sat of 96%. Troponin of 149, which I suspect is demand ischemia. He states he has not been increasing his oxygen at home over the past 2 days, and endorses saturations that were well below 88% . He states he has not had significant chest pain. He had been see in the office 07/05/2021, oxygen use 2-8L [place]. Total dyspnea score at that time was 15. PFT's showed DLCO of 37%, which is significantly lower than his previous DLCO 9/22 which was 61%. He had been advised to stop his lasix as he did not look volume overloaded, and most recent BNP was 44 . He states he did eat a hamburger on the way home from his office appointment , does not endorse choking on his food. He started having worse than baseline dyspnea Friday evening, with continued decompensation Saturday and Sunday.  Pt has significantly improved on NRB , PCCM were asked to evaluate for admission. Patient states he does not  want intubation, but would agree to BiPAP as needed. Pt. Will be admitted by [facility] , Step Down unit, and PCCM will remain on as daily consult with ability to take over if he decompensates.   Pertinent  Medical History    Past Medical History:  Diagnosis Date   Agatston CAC score, <100 04/2018   Arthritis    NECK   Cough    NON-PRODUCTIVE   Depressive disorder 10/2011   follow by dr [facility] psychiatry; has anxiety and panic disorder as well.   Diverticulosis    Diverticulosis of colon    Followed by Dr. Henrene Pastor   Essential hypertension    Generalized anxiety disorder    GERD (gastroesophageal reflux disease)    WATCHES DIET   Hemorrhoid    Hiatal hernia    Describes a sliding hiatal hernia   History of colon polyps    History of kidney stones    Idiopathic pulmonary fibrosis ([place])    Microscopic hematuria    Nocturia    Panic attacks    Prostate cancer ([place]) 07/2012   Dr. Arnell Sieving urology.   Sinus infection    Tinnitus    LEFT EAR -- CHRONIC     Significant Hospital Events: Including procedures, antibiotic start and stop dates in addition to other pertinent events   Admitted 05/29/2021 for IPF/ CHF flare Admission 07/23/2021 with Pneumonia, CHF  Vanc and Cefepime initiated 07/12/2021   Cardiac Cath 05/30/2021 The left ventricular systolic function is normal.  LV end diastolic pressure is normal.   The left ventricular ejection fraction is 50-55% by visual estimate.   Hemodynamic findings consistent with mild pulmonary hypertension.   There is no aortic valve stenosis.   Mild, diffuse nonobstructive coronary artery disease.   Aortic saturation 94%, PA saturation 65%, PA pressure 55/21, mean PA pressure 34 mmHg, pulmonary capillary wedge pressure 9 mmHg, cardiac output 4.75 L/min, cardiac index 2.5.  Elevated troponin likely from demand ischemia.  Nonobstructive, minimal CAD.  Continue medical therapy.      Interim History / Subjective:  Weaned to  salter HFNC 10 L with sats 96% No complaints overnight Denies resp distress  Objective   Blood pressure 129/81, pulse 63, temperature 97.6 F (36.4 C), temperature source Oral, resp. rate 20, height '5\' 9"'$  (1.753 m), weight 71.1 kg, SpO2 100 %.        Intake/Output Summary (Last 24 hours) at 07/11/2021 0800 Last data filed at 07/11/2021 1017 Gross per 24 hour  Intake 1701.35 ml  Output 1550 ml  Net 151.35 ml    Filed Weights   07/10/21 0044 07/11/21 0046 07/11/21 0620  Weight: 70.1 kg 72.3 kg 71.1 kg    Examination: General:   NAD HEENT: MM pink/moist; salter HFNC in place Neuro: Aox3; MAE CV: s1s2, no m/r/g PULM:  dim clear BS bilaterally; 10 L salter HFNC sats 96% GI: soft, bsx4 active  Extremities: warm/dry, no edema  Skin: no rashes or lesions    Resolved Hospital Problem list     Assessment & Plan:  Acute on chronic hypoxemic respiratory failure- with background of IPF, worsening R sided haziness.  Viral studies neg, MRSA PCR neg.  Aspiration vs. AE-IPF vs. Pulmonary edema vs. CAP vs. Some combination. P: -continue salter HFNC and wean fio2 for sats >90% -continue abx -continue steroids -trend CXR -pulm toiletry: IS/flutter -PT/OT -diuresis as needed    JD Geryl [facility] 07/11/2021, 8:06 AM  Please see [email or web address] for pager details.  From 7A-7P if no response, please call [phone]. After hours, please call ELink [phone].

## 2021-07-11 NOTE — TOC Progression Note (Signed)
Transition of Care [facility]) - Progression Note    Patient Details  Name: Eduardo Fuentes MRN: [id number] Date of Birth: Oct 11, 1950  [facility] of [facility]) CM/SW Contact  Zenon Mayo, RN Phone Number: 07/11/2021, 11:55 AM  Clinical Narrative:    He is set up with [facility] for [place], [place].  [employer] will supply shower stool ( patient will pay for) and rollator prior to dc.  Patient request for [employer] to call tomorrow to get card information from patient.  Wife will transport patient home.   Expected Discharge Plan: [place] Barriers to Discharge: Continued Medical Work up  Expected Discharge Plan and Services Expected Discharge Plan: [place]   Discharge Planning Services: CM Consult Post Acute Care Choice: Durable Medical Equipment, Home Health Living arrangements for the past 2 months: Single Family Home                 DME Arranged: Walker rolling with seat, Shower stool DME Agency: [employer] Date DME Agency Contacted: 07/11/21 Time DME Agency Contacted: [id number] Representative spoke with at DME Agency: Jodell Cipro HH Arranged: PT, OT [place] Agency: [place] ([place]) Date [place]: 07/11/21 Time [place]: 1154 Representative spoke with at [place]: [place] ([place]) Interventions    Readmission Risk Interventions    07/11/2021   10:40 AM  Readmission Risk Prevention Plan  Transportation Screening Complete  PCP or Specialist Appt within 3-5 Days Complete  HRI or [place] Complete  Social Work Consult for [place] Planning/Counseling Complete  Palliative Care Screening Complete  Medication Review Press photographer) Complete

## 2021-07-12 ENCOUNTER — Inpatient Hospital Stay (HOSPITAL_COMMUNITY): Payer: PPO

## 2021-07-12 DIAGNOSIS — J84112 Idiopathic pulmonary fibrosis: Secondary | ICD-10-CM | POA: Diagnosis not present

## 2021-07-12 DIAGNOSIS — R0902 Hypoxemia: Secondary | ICD-10-CM | POA: Diagnosis not present

## 2021-07-12 DIAGNOSIS — J9611 Chronic respiratory failure with hypoxia: Secondary | ICD-10-CM | POA: Diagnosis not present

## 2021-07-12 DIAGNOSIS — J9621 Acute and chronic respiratory failure with hypoxia: Secondary | ICD-10-CM | POA: Diagnosis not present

## 2021-07-12 DIAGNOSIS — I480 Paroxysmal atrial fibrillation: Secondary | ICD-10-CM

## 2021-07-12 LAB — BASIC METABOLIC PANEL
Anion gap: 5 (ref 5–15)
BUN: 23 mg/dL (ref 8–23)
CO2: 33 mmol/L — ABNORMAL HIGH (ref 22–32)
Calcium: 8.5 mg/dL — ABNORMAL LOW (ref 8.9–10.3)
Chloride: 100 mmol/L (ref 98–111)
Creatinine, Ser: 0.93 mg/dL (ref 0.61–1.24)
GFR, Estimated: >60 mL/min (ref >60)
Glucose, Bld: 125 mg/dL — ABNORMAL HIGH (ref 70–99)
Potassium: 5 mmol/L (ref 3.5–5.1)
Sodium: 138 mmol/L (ref 135–145)

## 2021-07-12 LAB — BRAIN NATRIURETIC PEPTIDE: B Natriuretic Peptide: 160.2 pg/mL — ABNORMAL HIGH (ref 0.0–100.0)

## 2021-07-12 MED ORDER — METHYLPREDNISOLONE SODIUM SUCC 125 MG IJ SOLR
80.0000 mg | Freq: Three times a day (TID) | INTRAMUSCULAR | Status: DC
  Administered 2021-07-12 – 2021-07-16 (×12): 80 mg via INTRAVENOUS
  Filled 2021-07-12 (×12): qty 2

## 2021-07-12 MED ORDER — METHYLPREDNISOLONE SODIUM SUCC 125 MG IJ SOLR
80.0000 mg | INTRAMUSCULAR | Status: AC
  Administered 2021-07-12: 80 mg via INTRAVENOUS
  Filled 2021-07-12: qty 2

## 2021-07-12 MED ORDER — SODIUM CHLORIDE 0.9 % IV SOLN: INTRAVENOUS | Status: DC | PRN

## 2021-07-12 MED ORDER — ORAL CARE MOUTH RINSE
15.0000 mL | Freq: Two times a day (BID) | OROMUCOSAL | Status: DC
  Administered 2021-07-12 – 2021-07-23 (×23): 15 mL via OROMUCOSAL

## 2021-07-12 NOTE — Progress Notes (Signed)
NAME:  Eduardo Fuentes, MRN:  [id number], DOB:  12-31-1950, LOS: 4 ADMISSION DATE:  07/19/2021, CONSULTATION DATE:  06/25/2021 REFERRING MD:  Masters, CHIEF COMPLAINT:  Hypoxemia, IPF, Pneumonia, Heart Failure   History of Present Illness:  71 year old male, never smoker with history of IPF on Ofev as an outpatient with recent admission last month for IPF exacerbation, elevated troponins.  He had a cardiac cath during that admission which showed nonobstructive coronary artery disease. Now readmitted with worsening dyspnea, hypoxia to the 50s.  Chest x-ray showed new right lung infiltrates.  After his flare in April he was noted to require 2 L of oxygen at rest and up to 8 L on exertion   Pertinent  Medical History   PMH significant for coronary artery calcification, afib, IPF, prostate CA, hyperlipidemia, GERD with sliding hiatal hernia, GAD, panic disorder with agoraphobia.   Past Medical History:  Diagnosis Date   Agatston CAC score, <100 04/2018   Arthritis    NECK   Cough    NON-PRODUCTIVE   Depressive disorder 10/2011   follow by dr [facility] psychiatry; has anxiety and panic disorder as well.   Diverticulosis    Diverticulosis of colon    Followed by Dr. Henrene Pastor   Essential hypertension    Generalized anxiety disorder    GERD (gastroesophageal reflux disease)    WATCHES DIET   Hemorrhoid    Hiatal hernia    Describes a sliding hiatal hernia   History of colon polyps    History of kidney stones    Idiopathic pulmonary fibrosis ([place])    Microscopic hematuria    Nocturia    Panic attacks    Prostate cancer ([place]) 07/2012   Dr. Arnell Sieving urology.   Sinus infection    Tinnitus    LEFT EAR -- CHRONIC     Significant Hospital Events: Including procedures, antibiotic start and stop dates in addition to other pertinent events   Admitted 05/29/2021 for IPF/ CHF flare Admission 07/14/2021 with Pneumonia, CHF  Vanc and Cefepime initiated 07/22/2021   Cardiac Cath  05/30/2021 The left ventricular systolic function is normal.   LV end diastolic pressure is normal.   The left ventricular ejection fraction is 50-55% by visual estimate.   Hemodynamic findings consistent with mild pulmonary hypertension.   There is no aortic valve stenosis.   Mild, diffuse nonobstructive coronary artery disease.   Aortic saturation 94%, PA saturation 65%, PA pressure 55/21, mean PA pressure 34 mmHg, pulmonary capillary wedge pressure 9 mmHg, cardiac output 4.75 L/min, cardiac index 2.5.  Elevated troponin likely from demand ischemia.  Nonobstructive, minimal CAD.  Continue medical therapy.      Interim History / Subjective:   Walked with PT yesterday on 15 L oxygen with sats dropping to 69%, needing 5 minutes to recover Today saturation 89 to 92% on 10 L salter nasal cannula Complains of shortness of breath and cough  Objective   Blood pressure 137/84, pulse 69, temperature 97.7 F (36.5 C), temperature source Oral, resp. rate 15, height '5\' 9"'$  (1.753 m), weight 71.1 kg, SpO2 93 %.        Intake/Output Summary (Last 24 hours) at 07/12/2021 0902 Last data filed at 07/12/2021 0810 Gross per 24 hour  Intake 418 ml  Output 800 ml  Net -382 ml    Filed Weights   07/10/21 0044 07/11/21 0046 07/11/21 0620  Weight: 70.1 kg 72.3 kg 71.1 kg    Examination: General:   NAD HEENT:  MM pink/moist; salter HFNC in place Neuro: Aox3; MAE CV: S1-S2 regular, no murmur PULM: No accessory muscle use, bilateral basal dry crackles one third GI: soft, bsx4 active  Extremities: warm/dry, no edema  Skin: no rashes or lesions    Labs show mild hyperkalemia, mild leukocytosis, BNP 160 Chest x-ray 5/19 independently reviewed shows unchanged pulmonary fibrosis pattern with increased asymmetric opacity on the right HRCT from 05/2021 reviewed, shows new bilateral groundglass opacities with new trace right pleural effusion  Resolved Hospital Problem list     Assessment & Plan:   Acute on chronic hypoxemic respiratory failure- with background of IPF, worsening R sided haziness.  Viral studies neg, MRSA PCR neg.  Aspiration vs. AE-IPF vs. Pulmonary edema vs. CAP vs. Some combination.  Unfortunately his persistent hypoxia, coarse, low procalcitonin all points to an IPF exacerbation P: -continue salter HFNC and wean fio2 for sats >88% -I explained dismal prognosis of IPF flare , he seems to have settled on a new baseline of 10 L nasal cannula at rest but does not tolerate exertion even on 15 L.  He has not responded to low-dose Solu-Medrol 80 q 8, will trial high-dose Solu-Medrol for 3 days and see if hypoxia improves at all.  If no response, may have to proceed with palliative conversation -pulm toiletry: IS/flutter -PT/OT as tolerated but may be limited by hypoxia -diuresis as indicated by BNP or edema  PCCM will evaluate again on Monday  Kong Packett V. Elsworth Soho MD [place] [facility] 07/12/2021, 9:02 AM  Please see [email or web address] for pager details.  From 7A-7P if no response, please call [phone]. After hours, please call ELink [phone].

## 2021-07-12 NOTE — Progress Notes (Signed)
Mobility Specialist Progress Note:   07/12/21 1435  Mobility  Activity Transferred from chair to bed  Level of Assistance Contact guard assist, steadying assist  Assistive Device Front wheel walker  Distance Ambulated (ft) 2 ft  Activity Response Tolerated well  $Mobility charge 1 Mobility   Pt transferring to bed on my arrival. Required no physical assistance, only guarding for safety. Pt repositioned in bed. Pt transitioned to back to [place], SpO2 94%. Pt back in bed with all needs met.   Nelta Numbers Acute Rehab Secure Chat or Office Phone: [phone]

## 2021-07-12 NOTE — Evaluation (Signed)
Occupational Therapy Evaluation Patient Details Name: Eduardo Fuentes MRN: [id number] DOB: 01/01/1951 Today's Date: 07/12/2021   History of Present Illness Pt is a 71 y.o. male who presented 07/09/2021 with SOB and hypoxia to the 50s. Pt admitted with acute on chronic hypoxemia respiratory failure ;ikely secondary to PNA vs IFP exacerbation. PMH: coronary artery calcification, afib, IPF, prostate CA, hyperlipidemia, GERD with sliding hiatal hernia, GAD, panic disorder with agoraphobia   Clinical Impression   Prior to this admission, patient on 3L of oxygen at rest and 8L with mobility, with wife, niece and family members assisting with IADLs and intermittently with lower body ADLs. Currently, patient is min A for ADLs, min guard to min A for transfers and significantly limited by oxygen level. O2 dropping to 72% on 12L HFNC and was not rebounding post transfer to recliner, RN present and placing patient on non-rebreather to increase O2 levels above 90%. Patient handed off to RN at end of session. OT recommending [place] services at discharge in order to address functional deficits, OT will continue to follow acutely.      Recommendations for follow up therapy are one component of a multi-disciplinary discharge planning process, led by the attending physician.  Recommendations may be updated based on patient status, additional functional criteria and insurance authorization.   Follow Up Recommendations  Home health OT    Assistance Recommended at Discharge Frequent or constant Supervision/Assistance  Patient can return home with the following A little help with walking and/or transfers;A little help with bathing/dressing/bathroom;Assistance with cooking/housework;Assist for transportation;Help with stairs or ramp for entrance    Functional Status Assessment  Patient has had a recent decline in their functional status and demonstrates the ability to make significant improvements in function in a  reasonable and predictable amount of time.  Equipment Recommendations  None recommended by OT (Patient has DME needed)    Recommendations for Other Services       Precautions / Restrictions Precautions Precautions: Fall;Other (comment) Precaution Comments: watch SpO2 Restrictions Weight Bearing Restrictions: No      Mobility Bed Mobility Overal bed mobility: Needs Assistance Bed Mobility: Supine to Sit     Supine to sit: Min guard, HOB elevated     General bed mobility comments: min gaurd for safety, no assist required    Transfers Overall transfer level: Needs assistance Equipment used: Rolling walker (2 wheels) Transfers: Sit to/from Stand Sit to Stand: Min guard           General transfer comment: Cues for hand placement, min guard for safety, no LOB      Balance Overall balance assessment: Needs assistance Sitting-balance support: No upper extremity supported, Feet supported Sitting balance-Leahy Scale: Good     Standing balance support: Bilateral upper extremity supported, During functional activity, Reliant on assistive device for balance Standing balance-Leahy Scale: Poor Standing balance comment: Reliant on RW and up to minA when turning                           ADL either performed or assessed with clinical judgement   ADL Overall ADL's : Needs assistance/impaired Eating/Feeding: Set up;Sitting   Grooming: Set up;Sitting   Upper Body Bathing: Set up;Sitting   Lower Body Bathing: Minimal assistance;Sit to/from stand;Sitting/lateral leans   Upper Body Dressing : Set up;Sitting   Lower Body Dressing: Minimal assistance;Sitting/lateral leans;Sit to/from stand   Toilet Transfer: Minimal assistance;Stand-pivot;Ambulation;Rolling walker (2 wheels);BSC/3in1 Toilet Transfer Details (indicate cue type and  reason): Simulated with transfer to recliner, cannot ambulate to bathroom due to current oxygen demand Toileting- Clothing Manipulation  and Hygiene: Total assistance;Bed level Toileting - Clothing Manipulation Details (indicate cue type and reason): Using bed pan for bowel movement     Functional mobility during ADLs: Minimal assistance;Cueing for sequencing;Cueing for safety;Rolling walker (2 wheels) General ADL Comments: Patient presenting with decreased activity tolerance, and need for significant increase of oxygen to maintain saturations     Vision Baseline Vision/History: 0 No visual deficits Ability to See in Adequate Light: 0 Adequate Patient Visual Report: No change from baseline       Perception     Praxis      Pertinent Vitals/Pain Pain Assessment Pain Assessment: No/denies pain     Hand Dominance     Extremity/Trunk Assessment Upper Extremity Assessment Upper Extremity Assessment: Generalized weakness   Lower Extremity Assessment Lower Extremity Assessment: Defer to PT evaluation   Cervical / Trunk Assessment Cervical / Trunk Assessment: Kyphotic   Communication Communication Communication: No difficulties   Cognition Arousal/Alertness: Awake/alert Behavior During Therapy: WFL for tasks assessed/performed Overall Cognitive Status: Within Functional Limits for tasks assessed                                 General Comments: Alert and participatory, demonstrating anticipatory awareness with O2 demands for discharge home     General Comments  O2 dropping to 72% on 12L HFNC was not rebounding, RN present and placing patient on non-rebreather to increase O2 levels above 90%    Exercises     Shoulder Instructions      Home Living Family/patient expects to be discharged to:: Private residence Living Arrangements: Spouse/significant other Available Help at Discharge: Family;Available 24 hours/day (wife can assist some, but not a lot) Type of Home: House Home Access: Stairs to enter [employer] of Steps: 4 Entrance Stairs-Rails: Left (ascending) Home Layout: One  level     Bathroom Shower/Tub: Teacher, early years/pre: Standard     Home Equipment: Cane - single point;Wheelchair - manual   Additional Comments: Uses 3L O2 at rest, 8L O2 with mobility. Niece and sister also can assist on occasion      Prior Functioning/Environment Prior Level of Function : Independent/Modified Independent             Mobility Comments: Did not use AD. Denies any falls. ADLs Comments: Reports increased difficulty standing long enough for ADLs at sink. Wife does cleaning and cooking.        OT Problem List: Decreased strength;Decreased activity tolerance;Impaired balance (sitting and/or standing);Decreased coordination;Decreased safety awareness;Decreased knowledge of use of DME or AE;Decreased knowledge of precautions;Cardiopulmonary status limiting activity      OT Treatment/Interventions: Self-care/ADL training;Therapeutic exercise;Energy conservation;DME and/or AE instruction;Manual therapy;Patient/family education;Balance training;Therapeutic activities    OT Goals(Current goals can be found in the care plan section) Acute Rehab OT Goals Patient Stated Goal: to get my oxygen under control OT Goal Formulation: With patient/family Time For Goal Achievement: 07/26/21 Potential to Achieve Goals: Good ADL Goals Pt Will Perform Lower Body Bathing: Independently;sit to/from stand;sitting/lateral leans Pt Will Perform Lower Body Dressing: Independently;sitting/lateral leans;sit to/from stand Pt Will Transfer to Toilet: Independently;ambulating;regular height toilet Pt/caregiver will Perform Home Exercise Program: Increased strength;Both right and left upper extremity;With written HEP provided;With Supervision;With theraband Additional ADL Goal #1: Patient will demonstrate increased activity tolerance and appropriate O2 levels in order to complete functional  tasks in standing for 3-5 minutes without need for seated rest break. Additional ADL Goal  #2: Patient will be able to verbalize 3 strategies for energy conservation for safe discharge home.  OT Frequency: Min 2X/week    Co-evaluation              AM-PAC OT "6 Clicks" Daily Activity     Outcome Measure Help from another person eating meals?: A Little Help from another person taking care of personal grooming?: A Little Help from another person toileting, which includes using toliet, bedpan, or urinal?: A Lot Help from another person bathing (including washing, rinsing, drying)?: A Little Help from another person to put on and taking off regular upper body clothing?: A Little Help from another person to put on and taking off regular lower body clothing?: A Little 6 Click Score: 17   End of Session Equipment Utilized During Treatment: Gait belt;Rolling walker (2 wheels);Oxygen Nurse Communication: Mobility status;Other (comment) (Increased O2 needs)  Activity Tolerance: Treatment limited secondary to medical complications (Comment) Patient left: in chair;with call bell/phone within reach;with nursing/sitter in room;with family/visitor present  OT Visit Diagnosis: Unsteadiness on feet (R26.81);Other abnormalities of gait and mobility (R26.89);Muscle weakness (generalized) (M62.81)                Time: 5789-7847 OT Time Calculation (min): 21 min Charges:  OT General Charges $OT Visit: 1 Visit OT Evaluation $OT Eval Moderate Complexity: 1 Mod  Corinne Ports E. Kaiah Hosea, OTR/L Acute Rehabilitation Services [phone] [place] 07/12/2021, 10:54 AM

## 2021-07-12 NOTE — Progress Notes (Signed)
PROGRESS NOTE    Eduardo Fuentes  LYY:[id number] DOB: 1950-10-05 DOA: 07/22/2021 PCP: Lujean Amel, MD   Brief Narrative:  71 year old male with HTN, pulmonary fibrosis on 8 L with activities at home, prostate cancer, comes into the hospital with shortness of breath.  He has been having IPF for a number of years, but over the last several months he became worse, and started on oxygen about 3 months ago.  He has been on 2 to 3 L, but when seen by pulmonology last Friday he was decreased to 8 L with exertion.  He continued to progress getting worse at home and eventually came to the hospital.  Sats were at 60% despite nasal cannula.  Pulmonary team has been consulted.   Assessment & Plan:  Principal Problem:   Chronic respiratory failure with hypoxia (HCC) Active Problems:   Major depressive disorder, recurrent episode (HCC)   Panic disorder with agoraphobia   IPF (idiopathic pulmonary fibrosis) (HCC)   Paroxysmal atrial fibrillation (HCC) CHA2VASC2 = 3; on Eliiquis   Prolonged QT interval   Community acquired pneumonia     Acute on chronic respiratory failure with hypoxia Pulmonary fibrosis flare -Likely secondary to progression of IPF with superimposed pneumonia.  There is low suspicion for pulmonary embolism.  Continue supplemental oxygen and current antibiotics -Seen by pulmonary, advising to continue IV steroids for now. - Use Lasix with caution. - Current antibiotics cefepime  Pulmonary fibrosis -Currently on ofev, steroids.  Other as needed bronchodilators, Mucinex.   Prolonged QTc -Continue to monitor  Essential hypertension -Cardizem 120 mg daily   Hyperlipidemia -Continue Crestor   Depression/anxiety -Klonopin 1 mg twice daily   Paroxysmal A-fib  -On Cardizem and Eliquis    DVT prophylaxis: Eliquis Code Status: No CPR Family Communication: Wife is at bedside  Status is: Inpatient Remains inpatient appropriate because: Maintain hospital stay on IV  steroids due to significant amount of hypoxia and shortness of breath      Subjective: Minimal improvement in last 24 hours.  Still has dyspnea with minimal exertion.    Examination:  General exam: Appears calm and comfortable, 10 L nasal cannula. Respiratory system: Bilateral rhonchi diffusely Cardiovascular system: S1 & S2 heard, RRR. No JVD, murmurs, rubs, gallops or clicks. No pedal edema. Gastrointestinal system: Abdomen is nondistended, soft and nontender. No organomegaly or masses felt. Normal bowel sounds heard. Central nervous system: Alert and oriented. No focal neurological deficits. Extremities: Symmetric 5 x 5 power. Skin: No rashes, lesions or ulcers Psychiatry: Judgement and insight appear normal. Mood & affect appropriate.     Objective: Vitals:   07/12/21 0330 07/12/21 0558 07/12/21 0600 07/12/21 0712  BP: 130/83  134/88 137/84  Pulse: 66  64 69  Resp: '20  20 15  '$ Temp: 97.8 F (36.6 C)  97.8 F (36.6 C) 97.7 F (36.5 C)  TempSrc: Oral  Oral Oral  SpO2: 100% 100% 100% 93%  Weight:      Height:        Intake/Output Summary (Last 24 hours) at 07/12/2021 0757 Last data filed at 07/12/2021 0600 Gross per 24 hour  Intake 537 ml  Output 800 ml  Net -263 ml   Filed Weights   07/10/21 0044 07/11/21 0046 07/11/21 0620  Weight: 70.1 kg 72.3 kg 71.1 kg     Data Reviewed:   CBC: Recent Labs  Lab 07/20/2021 0703 07/11/2021 0716 07/09/21 0450 07/11/21 0404  WBC 14.9*  --  8.6 13.7*  HGB 13.0 14.6 11.7* 11.5*  HCT 39.0 43.0 35.8* 35.2*  MCV 99.2  --  98.6 98.9  PLT 233  --  203 606   Basic Metabolic Panel: Recent Labs  Lab 07/05/2021 0703 06/30/2021 0716 07/07/2021 1111 07/09/21 0450 07/10/21 0154 07/12/21 0253  NA 137 139  --  137 138 138  K 3.5 3.5  --  4.5 4.1 5.0  CL 100  --   --  103 96* 100  CO2 28  --   --  25 33* 33*  GLUCOSE 100*  --   --  148* 141* 125*  BUN 21  --   --  20 31* 23  CREATININE 1.25*  --   --  1.04 1.20 0.93  CALCIUM  9.0  --   --  8.8* 8.8* 8.5*  MG  --   --  1.7  --  2.0  --    GFR: Estimated Creatinine Clearance: 72.9 mL/min (by C-G formula based on SCr of 0.93 mg/dL). Liver Function Tests: No results for input(s): AST, ALT, ALKPHOS, BILITOT, PROT, ALBUMIN in the last 168 hours. No results for input(s): LIPASE, AMYLASE in the last 168 hours. No results for input(s): AMMONIA in the last 168 hours. Coagulation Profile: No results for input(s): INR, PROTIME in the last 168 hours. Cardiac Enzymes: No results for input(s): CKTOTAL, CKMB, CKMBINDEX, TROPONINI in the last 168 hours. BNP (last 3 results) Recent Labs    05/29/21 1045 06/25/21 1439  PROBNP 926.0* 44.0   HbA1C: No results for input(s): HGBA1C in the last 72 hours. CBG: No results for input(s): GLUCAP in the last 168 hours. Lipid Profile: No results for input(s): CHOL, HDL, LDLCALC, TRIG, CHOLHDL, LDLDIRECT in the last 72 hours. Thyroid Function Tests: No results for input(s): TSH, T4TOTAL, FREET4, T3FREE, THYROIDAB in the last 72 hours. Anemia Panel: No results for input(s): VITAMINB12, FOLATE, FERRITIN, TIBC, IRON, RETICCTPCT in the last 72 hours. Sepsis Labs: Recent Labs  Lab 07/09/21 0450  PROCALCITON 0.50    Recent Results (from the past 240 hour(s))  Resp Panel by RT-PCR (Flu A&B, Covid) Nasopharyngeal Swab     Status: None   Collection Time: 06/24/2021  7:45 AM   Specimen: Nasopharyngeal Swab; Nasopharyngeal(NP) swabs in vial transport medium  Result Value Ref Range Status   SARS Coronavirus 2 by RT PCR NEGATIVE NEGATIVE Final    Comment: (NOTE) SARS-CoV-2 target nucleic acids are NOT DETECTED.  The SARS-CoV-2 RNA is generally detectable in upper respiratory specimens during the acute phase of infection. The lowest concentration of SARS-CoV-2 viral copies this assay can detect is 138 copies/mL. A negative result does not preclude SARS-Cov-2 infection and should not be used as the sole basis for treatment or other  patient management decisions. A negative result may occur with  improper specimen collection/handling, submission of specimen other than nasopharyngeal swab, presence of viral mutation(s) within the areas targeted by this assay, and inadequate number of viral copies(<138 copies/mL). A negative result must be combined with clinical observations, patient history, and epidemiological information. The expected result is Negative.  Fact Sheet for Patients:  EntrepreneurPulse.com.au  Fact Sheet for Healthcare Providers:  IncredibleEmployment.be  This test is no t yet approved or cleared by the [place] FDA and  has been authorized for detection and/or diagnosis of SARS-CoV-2 by FDA under an Emergency Use Authorization (EUA). This EUA will remain  in effect (meaning this test can be used) for the duration of the COVID-19 declaration under Section 564(b)(1) of the Act, 21 U.S.C.section 360bbb-3(b)(1),  unless the authorization is terminated  or revoked sooner.       Influenza A by PCR NEGATIVE NEGATIVE Final   Influenza B by PCR NEGATIVE NEGATIVE Final    Comment: (NOTE) The Xpert Xpress SARS-CoV-2/FLU/RSV plus assay is intended as an aid in the diagnosis of influenza from Nasopharyngeal swab specimens and should not be used as a sole basis for treatment. Nasal washings and aspirates are unacceptable for Xpert Xpress SARS-CoV-2/FLU/RSV testing.  Fact Sheet for Patients: EntrepreneurPulse.com.au  Fact Sheet for Healthcare Providers: IncredibleEmployment.be  This test is not yet approved or cleared by the [place] FDA and has been authorized for detection and/or diagnosis of SARS-CoV-2 by FDA under an Emergency Use Authorization (EUA). This EUA will remain in effect (meaning this test can be used) for the duration of the COVID-19 declaration under Section 564(b)(1) of the Act, 21 U.S.C. section  360bbb-3(b)(1), unless the authorization is terminated or revoked.  Performed at [facility], [place] [address], [place], [place] [address]   Expectorated Sputum Assessment w Gram Stain, Rflx to Resp Cult     Status: None   Collection Time: 07/06/2021 10:25 AM   Specimen: Sputum  Result Value Ref Range Status   Specimen Description SPUTUM  Final   Special Requests NONE  Final   Sputum evaluation   Final    Sputum specimen not acceptable for testing.  Please recollect.   Gram Stain Report Called to,Read Back By and Verified With: RN Octavio Manns [phone] '@[phone]'$  FH Performed at [facility], [place] [address] [place], [place] [address]    Report Status 07/03/2021 FINAL  Final  MRSA Next Gen by PCR, Nasal     Status: None   Collection Time: 07/06/2021  5:42 PM   Specimen: Nasal Mucosa; Nasal Swab  Result Value Ref Range Status   MRSA by PCR Next Gen NOT DETECTED NOT DETECTED Final    Comment: (NOTE) The GeneXpert MRSA Assay (FDA approved for NASAL specimens only), is one component of a comprehensive MRSA colonization surveillance program. It is not intended to diagnose MRSA infection nor to guide or monitor treatment for MRSA infections. Test performance is not FDA approved in patients less than 40 years old. Performed at [facility], [place] [address], [place], [place] [address]          Radiology Studies: No results found.      Scheduled Meds:  apixaban  5 mg Oral BID   clonazePAM  1 mg Oral BID   diltiazem  120 mg Oral q AM   docusate sodium  100 mg Oral BID   gabapentin  600 mg Oral BID   methylPREDNISolone (SOLU-MEDROL) injection  40 mg Intravenous Q12H   Nintedanib  150 mg Oral BID   pantoprazole  40 mg Oral QHS   psyllium  1 packet Oral QHS   rosuvastatin  20 mg Oral Daily   sodium chloride flush  3 mL Intravenous Q12H   Continuous Infusions:  ceFEPime (MAXIPIME) IV 2 g (07/12/21 0320)     LOS: 4 days   Time spent= 35 mins    Krissy Orebaugh Arsenio Loader, MD [facility]  If 7PM-7AM, please contact night-coverage  07/12/2021, 7:57 AM

## 2021-07-12 NOTE — Progress Notes (Signed)
Speech Language Pathology Treatment: Dysphagia  Patient Details Name: Eduardo Fuentes MRN: [id number] DOB: 1950-04-10 Today's Date: 07/12/2021 Time: 5284-1324 SLP Time Calculation (min) (ACUTE ONLY): 12 min  Assessment / Plan / Recommendation Clinical Impression  Pt was sitting upright in his chair and on [place] for PO trials. He had no overt difficulties during PO intake, but did exhibit delayed throat clearing and coughing that started after trials while pt was talking with SLP. His wife is present today and shares that he sometimes has coughing episodes like this after he eats, and he agrees that it is usually after a meal is over. This could be consistent with esophageal hx. Education was reinforced about precautions: aspiration, respiratory, and esophageal. He prefers to stay on mechanical soft diet while inpatient, as this is most consistent with what he eats at home. Given pt's and wife's concern about symptoms and cueing needed for recall of education, will f/u briefly acutely.   HPI HPI: Pt is a 71 yo male recently admitted for IPF exacerbation and elevated troponins, now readmitted wtih worsening dyspnea and hypoxia to the 50s. CXR showed new R lung infiltrates. Admitted with acute hypoxic respiratory failure likely secondary to PNA vs IPF exacerbation. PMH includes: difficulty swallowing due to esophageal strictures, GERD, HH, idiopathic pulmonary fibrosis, arthritis (neck), non-productive cough, generalized anxiety disorder, panic attacks      SLP Plan  Continue with current plan of care      Recommendations for follow up therapy are one component of a multi-disciplinary discharge planning process, led by the attending physician.  Recommendations may be updated based on patient status, additional functional criteria and insurance authorization.    Recommendations  Diet recommendations: Dysphagia 3 (mechanical soft);Thin liquid Liquids provided via: Straw;Cup Medication Administration:  Whole meds with puree Supervision: Patient able to self feed Compensations: Slow rate;Small sips/bites Postural Changes and/or Swallow Maneuvers: Seated upright 90 degrees;Upright 30-60 min after meal                Oral Care Recommendations: Oral care BID Follow Up Recommendations: No SLP follow up Assistance recommended at discharge: PRN SLP Visit Diagnosis: Dysphagia, unspecified (R13.10) Plan: Continue with current plan of care           Osie Bond., M.A. [place] Office [phone]  Secure chat preferred   07/12/2021, 11:41 AM

## 2021-07-12 NOTE — Progress Notes (Signed)
Telemetry notified RN oxygen desat. RN arrived to bedside. Pt found with nasal cannula resting on lip; oxygen saturation 77%. Nasal cannula placed in pt's nostrils. Pt given education. Pt aware and verbalize understanding education. Oxygen saturation returning to normal range via HFNC 10L. Call bell within reach. Pt denies any further questions or concerns.

## 2021-07-12 NOTE — Progress Notes (Signed)
Palliative Medicine Progress Note   Patient Name: Eduardo Fuentes       Date: 07/12/2021 DOB: 10/20/1950  Age: 71 y.o. MRN#: [id number] Attending Physician: Eduardo Lack, MD Primary Care Physician: Eduardo Amel, MD Admit Date: 07/19/2021  Reason for Consultation/Follow-up: {Reason for Consult:23484}  HPI/Patient Profile: 71 y.o. male  with past medical history of IPF, atrial fibrillation, prostate cancer, coronary artery calcification, sliding hiatal hernia, generalized anxiety with panic disorder and agoraphobia, GERD, and hyperlipidemia.  He presented to the emergency department on 07/21/2021 with 2 days of worsening dyspnea.  He had been seen in the pulmonary office 5/12 and recommended to increase O2 to 8L on exertion.  Later that evening he had acute onset of worsening dyspnea which became progressively worse over the next 2 days. He had not increased his oxygen to 8 L as recommended due to the Fuentes of the concentrator in his home. On arrival to the ED, his O2 sats were in the 49s.  He quickly improved to the upper 90s with nonrebreather.  Chest x-ray showed increased opacification in the right lung suspicious for pneumonia.  Labs significant for WBC 14.9 and BNP 1048.  Troponin elevated at 149, but is suspected to be demand ischemia.  He had been advised to stop Lasix as he did not look volume overloaded, and most recent BNP was 44. He is admitted to [facility] with pneumonia, acute on chronic hypoxic respiratory failure, and acute on chronic CHF.  Subjective: Visited patient at bedside.  He is out of bed to the recliner, eating lunch.  Objective:  Physical Exam Constitutional:      General: He is not in acute distress.    Appearance: He is ill-appearing.  Cardiovascular:     Rate and  Rhythm: Normal rate.  Pulmonary:     Effort: Pulmonary effort is normal. Tachypnea present.  Neurological:     Mental Status: He is alert and oriented to person, place, and time.            Vital Signs: BP 128/80 (BP Location: Left Arm)   Pulse 72   Temp 97.6 F (36.4 C) (Oral)   Resp (!) 25   Ht '5\' 9"'$  (1.753 m)   Wt 71.1 kg   SpO2 96%   BMI 23.15 kg/m  SpO2: SpO2: 96 % O2  Device: O2 Device: High Flow Nasal Cannula O2 Flow Rate: O2 Flow Rate (L/min): 10 L/min   LBM: Last BM Date : 07/11/21     Palliative Assessment/Data: ***     Palliative Medicine Assessment & Plan   Assessment: Principal Problem:   Chronic respiratory failure with hypoxia (HCC) Active Problems:   Major depressive disorder, recurrent episode (HCC)   Panic disorder with agoraphobia   IPF (idiopathic pulmonary fibrosis) (HCC)   Paroxysmal atrial fibrillation (HCC) CHA2VASC2 = 3; on Eliiquis   Prolonged QT interval   Community acquired pneumonia    Recommendations/Plan: Full code as previously documented -no intubation Continue all interventions with watchful waiting Patient is hopeful for improvement and to return home   Prognosis:  Unable to determine  Discharge Planning: {Palliative dispostion:23505}  Care plan was discussed with ***  Thank you for allowing the Palliative Medicine Team to assist in the care of this patient.   ***   Eduardo Bullion, NP   Please contact Palliative Medicine Team phone at [phone] for questions and concerns.  For individual providers, please see AMION.

## 2021-07-12 NOTE — Plan of Care (Signed)
  Problem: Activity: Goal: Ability to tolerate increased activity will improve Outcome: Progressing   Problem: Respiratory: Goal: Ability to maintain a clear airway will improve Outcome: Progressing   

## 2021-07-12 NOTE — Plan of Care (Signed)
  Problem: Activity: Goal: Ability to tolerate increased activity will improve Outcome: Progressing   Problem: Clinical Measurements: Goal: Ability to maintain a body temperature in the normal range will improve Outcome: Progressing   Problem: Respiratory: Goal: Ability to maintain adequate ventilation will improve Outcome: Progressing Goal: Ability to maintain a clear airway will improve Outcome: Progressing   

## 2021-07-13 DIAGNOSIS — J9611 Chronic respiratory failure with hypoxia: Secondary | ICD-10-CM | POA: Diagnosis not present

## 2021-07-13 LAB — CBC
HCT: 34.7 % — ABNORMAL LOW (ref 39.0–52.0)
Hemoglobin: 11.6 g/dL — ABNORMAL LOW (ref 13.0–17.0)
MCH: 32.8 pg (ref 26.0–34.0)
MCHC: 33.4 g/dL (ref 30.0–36.0)
MCV: 98 fL (ref 80.0–100.0)
Platelets: 280 10*3/uL (ref 150–400)
RBC: 3.54 MIL/uL — ABNORMAL LOW (ref 4.22–5.81)
RDW: 14.6 % (ref 11.5–15.5)
WBC: 12.3 10*3/uL — ABNORMAL HIGH (ref 4.0–10.5)
nRBC: 0 % (ref 0.0–0.2)

## 2021-07-13 LAB — BASIC METABOLIC PANEL
Anion gap: 6 (ref 5–15)
BUN: 23 mg/dL (ref 8–23)
CO2: 31 mmol/L (ref 22–32)
Calcium: 8.4 mg/dL — ABNORMAL LOW (ref 8.9–10.3)
Chloride: 101 mmol/L (ref 98–111)
Creatinine, Ser: 0.83 mg/dL (ref 0.61–1.24)
GFR, Estimated: >60 mL/min (ref >60)
Glucose, Bld: 136 mg/dL — ABNORMAL HIGH (ref 70–99)
Potassium: 4.6 mmol/L (ref 3.5–5.1)
Sodium: 138 mmol/L (ref 135–145)

## 2021-07-13 LAB — MAGNESIUM: Magnesium: 2.2 mg/dL (ref 1.7–2.4)

## 2021-07-13 NOTE — Progress Notes (Signed)
Mobility Specialist Criteria Algorithm Info.   07/13/21 1400  Mobility  Activity Ambulated with assistance in hallway; Transferred to chair (to chair after ambulation)  Range of Motion/Exercises Active;All extremities  Assistive Device Four wheel walker  Distance Ambulated (ft) 10 ft (10 ambulating + 10 propelled + 20 propelled )  Activity Response Tolerated well  Transport method Ambulatory;Other (Comment) (Seated on Rolator)   Patient received in supine eager to participate in mobility. Completed education on energy conservation and pursed lip breathing with receptive teach back. Was independent for bed mobility from supine>sit. Ambulated short distance min guard with slow gait before requiring extended seated rest break on Rolator. Deferred ambulation but agreed to self propel while seated on Rolator. Propelled 10ft in hallway before needing another rest break. SpO2 lingered 71-93% on 10L throughout which required multiple rest recovery breaks. Returned to room without complaint or incident. Was left in recliner chair with all needs met, call bell in reach.   07/13/2021 4:32 PM   , CMS, BS EXP Acute Rehabilitation Services  Phone:[phone] Office: [phone]  

## 2021-07-13 NOTE — Progress Notes (Signed)
Patient had a "coughing spell", O2 sats dropped to the low 70s. Took a while to recover, so O2 increased to 12L. Sats WNL now.

## 2021-07-13 NOTE — Progress Notes (Signed)
Pharmacy Antibiotic Note  Eduardo Fuentes is a 71 y.o. male admitted on 07/11/2021 with pneumonia.  Pharmacy has been consulted for cefepime dosing.  Renal function has continued to improve with serum creatinine now 0.83. Patient also remains afebrile.   Plan: Continue cefepime 2g IV q8h Monitor renal function, clinical progress, cultures/sensitivities F/U LOT and de-escalate as able   Height: '5\' 9"'$  (175.3 cm) Weight: 65.2 kg (143 lb 11.8 oz) IBW/kg (Calculated) : 70.7  Temp (24hrs), Avg:97.5 F (36.4 C), Min:97.4 F (36.3 C), Max:97.6 F (36.4 C)  Recent Labs  Lab 07/15/2021 0703 07/09/21 0450 07/10/21 0154 07/11/21 0404 07/12/21 0253 07/13/21 0329  WBC 14.9* 8.6  --  13.7*  --  12.3*  CREATININE 1.25* 1.04 1.20  --  0.93 0.83     Estimated Creatinine Clearance: 75.3 mL/min (by C-G formula based on SCr of 0.83 mg/dL).    Allergies  Allergen Reactions   Aleve [Naproxen Sodium] Anaphylaxis, Nausea Only and Other (See Comments)    GI UPSET- Patient does not remember "anaphylaxis" in 2023   Demerol [Meperidine] Rash        Hyzaar [Losartan Potassium-Hctz] Other (See Comments)    "Felt badly"    Lisinopril Cough    Antimicrobials this admission: Cefepime 5/15 >>  Vancomycin  5/15 >> 5/16  Dose adjustments this admission: Cefepime 2g q12h increased to cefepime 2g q8h on 5/16  Microbiology results: 5/15 MRSA PCR: neg 5/15 sputum: recollect  Thank you for involving pharmacy in this patient's care.  Zenaida Deed, PharmD PGY1 Acute Care Pharmacy Resident  Phone: [phone] 07/13/2021  12:07 PM  Please check [email or web address] for unit-specific pharmacy phone numbers.

## 2021-07-13 NOTE — Progress Notes (Signed)
PROGRESS NOTE    Eduardo Fuentes  GBT:[id number] DOB: Jul 30, 1950 DOA: 07/18/2021 PCP: Lujean Amel, MD   Brief Narrative:  71 year old male with HTN, pulmonary fibrosis on 8 L with activities at home, prostate cancer, comes into the hospital with shortness of breath.  He has been having IPF for a number of years, but over the last several months he became worse, and started on oxygen about 3 months ago.  He has been on 2 to 3 L, but when seen by pulmonology last Friday he was decreased to 8 L with exertion.  He continued to progress getting worse at home and eventually came to the hospital.  Sats were at 60% despite nasal cannula.  Pulmonary team has been consulted.   Assessment & Plan:  Principal Problem:   Chronic respiratory failure with hypoxia (HCC) Active Problems:   Major depressive disorder, recurrent episode (HCC)   Panic disorder with agoraphobia   IPF (idiopathic pulmonary fibrosis) (HCC)   Paroxysmal atrial fibrillation (HCC) CHA2VASC2 = 3; on Eliiquis   Prolonged QT interval   Community acquired pneumonia     Acute on chronic respiratory failure with hypoxia Pulmonary fibrosis flare -Likely secondary to progression of IPF with superimposed pneumonia.  There is low suspicion for pulmonary embolism.  Continue supplemental oxygen and current antibiotics. After his flare in April he was noted to require 2 L of oxygen at rest and up to 8 L on exertion.  -Seen by pulmonary, due to high amount of oxygen requirement, he was started on high-dose of Solu-Medrol and the plan to continue this for at least 3 days and they will reassess him on Monday.  Patient feels only a little better compared to yesterday. -Currently on ofev, steroids.  Other as needed bronchodilators, Mucinex.   Prolonged QTc -Continue to monitor.  Avoid QTc prolonging medications.  Keep electrolytes within normal range.  Essential hypertension - Blood pressure controlled.  Continue Cardizem 120 mg daily    Hyperlipidemia -Continue Crestor   Depression/anxiety -Klonopin 1 mg twice daily   Paroxysmal A-fib  - Rates controlled on Cardizem and Eliquis   DVT prophylaxis: Eliquis Code Status: No CPR Family Communication: Wife is at bedside  Status is: Inpatient Remains inpatient appropriate because: Maintain hospital stay on IV steroids due to significant amount of hypoxia and shortness of breath  Subjective: Seen and examined.  Wife at the bedside.  No new complaint.  Still with shortness of breath, only slightly better than yesterday.  Examination:  General exam: Appears calm and comfortable  Respiratory system: Coarse breath sounds with bilateral rhonchi, no wheezes.  Respiratory effort normal. Cardiovascular system: S1 & S2 heard, RRR. No JVD, murmurs, rubs, gallops or clicks. No pedal edema. Gastrointestinal system: Abdomen is nondistended, soft and nontender. No organomegaly or masses felt. Normal bowel sounds heard. Central nervous system: Alert and oriented. No focal neurological deficits. Extremities: Symmetric 5 x 5 power. Skin: No rashes, lesions or ulcers.  Psychiatry: Judgement and insight appear normal. Mood & affect appropriate.   Objective: Vitals:   07/13/21 0525 07/13/21 0719 07/13/21 1103 07/13/21 1150  BP: (!) 142/95 136/86 132/82   Pulse: 65 65 64   Resp: (!) 21 19 (!) 23   Temp: 97.6 F (36.4 C) (!) 97.5 F (36.4 C)    TempSrc: Oral Oral    SpO2: 92% 93% 93% 94%  Weight:      Height:        Intake/Output Summary (Last 24 hours) at 07/13/2021 1257 Last  data filed at 07/13/2021 1231 Gross per 24 hour  Intake 1412.44 ml  Output 1350 ml  Net 62.44 ml    Filed Weights   07/11/21 0046 07/11/21 0620 07/13/21 0005  Weight: 72.3 kg 71.1 kg 65.2 kg     Data Reviewed:   CBC: Recent Labs  Lab 07/10/2021 0703 07/07/2021 0716 07/09/21 0450 07/11/21 0404 07/13/21 0329  WBC 14.9*  --  8.6 13.7* 12.3*  HGB 13.0 14.6 11.7* 11.5* 11.6*  HCT 39.0 43.0  35.8* 35.2* 34.7*  MCV 99.2  --  98.6 98.9 98.0  PLT 233  --  203 297 707    Basic Metabolic Panel: Recent Labs  Lab 07/18/2021 0703 07/23/2021 0716 07/02/2021 1111 07/09/21 0450 07/10/21 0154 07/12/21 0253 07/13/21 0329  NA 137 139  --  137 138 138 138  K 3.5 3.5  --  4.5 4.1 5.0 4.6  CL 100  --   --  103 96* 100 101  CO2 28  --   --  25 33* 33* 31  GLUCOSE 100*  --   --  148* 141* 125* 136*  BUN 21  --   --  20 31* 23 23  CREATININE 1.25*  --   --  1.04 1.20 0.93 0.83  CALCIUM 9.0  --   --  8.8* 8.8* 8.5* 8.4*  MG  --   --  1.7  --  2.0  --  2.2    GFR: Estimated Creatinine Clearance: 75.3 mL/min (by C-G formula based on SCr of 0.83 mg/dL). Liver Function Tests: No results for input(s): AST, ALT, ALKPHOS, BILITOT, PROT, ALBUMIN in the last 168 hours. No results for input(s): LIPASE, AMYLASE in the last 168 hours. No results for input(s): AMMONIA in the last 168 hours. Coagulation Profile: No results for input(s): INR, PROTIME in the last 168 hours. Cardiac Enzymes: No results for input(s): CKTOTAL, CKMB, CKMBINDEX, TROPONINI in the last 168 hours. BNP (last 3 results) Recent Labs    05/29/21 1045 06/25/21 1439  PROBNP 926.0* 44.0    HbA1C: No results for input(s): HGBA1C in the last 72 hours. CBG: No results for input(s): GLUCAP in the last 168 hours. Lipid Profile: No results for input(s): CHOL, HDL, LDLCALC, TRIG, CHOLHDL, LDLDIRECT in the last 72 hours. Thyroid Function Tests: No results for input(s): TSH, T4TOTAL, FREET4, T3FREE, THYROIDAB in the last 72 hours. Anemia Panel: No results for input(s): VITAMINB12, FOLATE, FERRITIN, TIBC, IRON, RETICCTPCT in the last 72 hours. Sepsis Labs: Recent Labs  Lab 07/09/21 0450  PROCALCITON 0.50     Recent Results (from the past 240 hour(s))  Resp Panel by RT-PCR (Flu A&B, Covid) Nasopharyngeal Swab     Status: None   Collection Time: 07/07/2021  7:45 AM   Specimen: Nasopharyngeal Swab; Nasopharyngeal(NP) swabs in  vial transport medium  Result Value Ref Range Status   SARS Coronavirus 2 by RT PCR NEGATIVE NEGATIVE Final    Comment: (NOTE) SARS-CoV-2 target nucleic acids are NOT DETECTED.  The SARS-CoV-2 RNA is generally detectable in upper respiratory specimens during the acute phase of infection. The lowest concentration of SARS-CoV-2 viral copies this assay can detect is 138 copies/mL. A negative result does not preclude SARS-Cov-2 infection and should not be used as the sole basis for treatment or other patient management decisions. A negative result may occur with  improper specimen collection/handling, submission of specimen other than nasopharyngeal swab, presence of viral mutation(s) within the areas targeted by this assay, and inadequate number of  viral copies(<138 copies/mL). A negative result must be combined with clinical observations, patient history, and epidemiological information. The expected result is Negative.  Fact Sheet for Patients:  EntrepreneurPulse.com.au  Fact Sheet for Healthcare Providers:  IncredibleEmployment.be  This test is no t yet approved or cleared by the [place] FDA and  has been authorized for detection and/or diagnosis of SARS-CoV-2 by FDA under an Emergency Use Authorization (EUA). This EUA will remain  in effect (meaning this test can be used) for the duration of the COVID-19 declaration under Section 564(b)(1) of the Act, 21 U.S.C.section 360bbb-3(b)(1), unless the authorization is terminated  or revoked sooner.       Influenza A by PCR NEGATIVE NEGATIVE Final   Influenza B by PCR NEGATIVE NEGATIVE Final    Comment: (NOTE) The Xpert Xpress SARS-CoV-2/FLU/RSV plus assay is intended as an aid in the diagnosis of influenza from Nasopharyngeal swab specimens and should not be used as a sole basis for treatment. Nasal washings and aspirates are unacceptable for Xpert Xpress SARS-CoV-2/FLU/RSV testing.  Fact  Sheet for Patients: EntrepreneurPulse.com.au  Fact Sheet for Healthcare Providers: IncredibleEmployment.be  This test is not yet approved or cleared by the [place] FDA and has been authorized for detection and/or diagnosis of SARS-CoV-2 by FDA under an Emergency Use Authorization (EUA). This EUA will remain in effect (meaning this test can be used) for the duration of the COVID-19 declaration under Section 564(b)(1) of the Act, 21 U.S.C. section 360bbb-3(b)(1), unless the authorization is terminated or revoked.  Performed at [facility], [place] [address], [place], [place] [address]   Expectorated Sputum Assessment w Gram Stain, Rflx to Resp Cult     Status: None   Collection Time: 06/28/2021 10:25 AM   Specimen: Sputum  Result Value Ref Range Status   Specimen Description SPUTUM  Final   Special Requests NONE  Final   Sputum evaluation   Final    Sputum specimen not acceptable for testing.  Please recollect.   Gram Stain Report Called to,Read Back By and Verified With: RN Octavio Manns [phone] '@2326'$  FH Performed at [facility], [place] [address] [place], [place] [address]    Report Status 07/13/2021 FINAL  Final  MRSA Next Gen by PCR, Nasal     Status: None   Collection Time: 06/30/2021  5:42 PM   Specimen: Nasal Mucosa; Nasal Swab  Result Value Ref Range Status   MRSA by PCR Next Gen NOT DETECTED NOT DETECTED Final    Comment: (NOTE) The GeneXpert MRSA Assay (FDA approved for NASAL specimens only), is one component of a comprehensive MRSA colonization surveillance program. It is not intended to diagnose MRSA infection nor to guide or monitor treatment for MRSA infections. Test performance is not FDA approved in patients less than 59 years old. Performed at [facility], [place] [address] [place], [place] [address]           Radiology Studies: DG Chest [place] 1 View  Result Date: 07/12/2021 CLINICAL DATA:  71 year old  male with pulmonary fibrosis, respiratory failure. EXAM: PORTABLE CHEST 1 VIEW COMPARISON:  Portable chest 07/10/2021 and earlier, including high-resolution chest CT 05/29/2021. FINDINGS: Portable AP semi upright view at 0542 hours. Stable low lung volumes, mediastinal contours. Asymmetric pulmonary interstitial opacity persists, likely in large part due to pulmonary fibrosis. Ventilation has not significantly changed since last month. No pneumothorax or pleural effusion. Visualized tracheal air column is within normal limits. Negative visible bowel gas. No acute  osseous abnormality identified. IMPRESSION: Pulmonary fibrosis with asymmetric increased opacity on the right. Allowing for portable technique ventilation does not appear significantly changed since last month. Electronically Signed   By: Genevie Ann M.D.   On: 07/12/2021 07:58        Scheduled Meds:  apixaban  5 mg Oral BID   clonazePAM  1 mg Oral BID   diltiazem  120 mg Oral q AM   docusate sodium  100 mg Oral BID   gabapentin  600 mg Oral BID   mouth rinse  15 mL Mouth Rinse BID   methylPREDNISolone (SOLU-MEDROL) injection  80 mg Intravenous Q8H   Nintedanib  150 mg Oral BID   pantoprazole  40 mg Oral QHS   psyllium  1 packet Oral QHS   rosuvastatin  20 mg Oral Daily   sodium chloride flush  3 mL Intravenous Q12H   Continuous Infusions:  sodium chloride 10 mL/hr at 07/13/21 0646   ceFEPime (MAXIPIME) IV 2 g (07/13/21 1011)     LOS: 5 days    Darliss Cheney, MD [facility]  If 7PM-7AM, please contact night-coverage  07/13/2021, 12:57 PM

## 2021-07-14 DIAGNOSIS — J9611 Chronic respiratory failure with hypoxia: Secondary | ICD-10-CM | POA: Diagnosis not present

## 2021-07-14 NOTE — Progress Notes (Signed)
PROGRESS NOTE    Eduardo Fuentes  SHF:[id number] DOB: Nov 14, 1950 DOA: 07/22/2021 PCP: Lujean Amel, MD   Brief Narrative:  71 year old male with HTN, pulmonary fibrosis on 8 L with activities at home, prostate cancer, comes into the hospital with shortness of breath.  He has been having IPF for a number of years, but over the last several months he became worse, and started on oxygen about 3 months ago.  He has been on 2 to 3 L, but when seen by pulmonology last Friday he was decreased to 8 L with exertion.  He continued to progress getting worse at home and eventually came to the hospital.  Sats were at 60% despite nasal cannula.  Pulmonary team has been consulted.   Assessment & Plan:  Principal Problem:   Chronic respiratory failure with hypoxia (HCC) Active Problems:   Major depressive disorder, recurrent episode (HCC)   Panic disorder with agoraphobia   IPF (idiopathic pulmonary fibrosis) (HCC)   Paroxysmal atrial fibrillation (HCC) CHA2VASC2 = 3; on Eliiquis   Prolonged QT interval   Community acquired pneumonia     Acute on chronic respiratory failure with hypoxia Pulmonary fibrosis flare -Likely secondary to progression of IPF with superimposed pneumonia.  There is low suspicion for pulmonary embolism.  Continue supplemental oxygen and current antibiotics. After his flare in April he was noted to require 2 L of oxygen at rest and up to 8 L on exertion.  -Seen by pulmonary, due to high amount of oxygen requirement, he was started on high-dose of Solu-Medrol and the plan to continue this for at least 3 days and they will reassess him on Monday.  Some improvement compared to yesterday.  Currently on 10 L of oxygen. -Currently on ofev, steroids.  Other as needed bronchodilators, Mucinex.   Prolonged QTc -Continue to monitor.  Avoid QTc prolonging medications.  Keep electrolytes within normal range.  Essential hypertension - Blood pressure controlled.  Continue Cardizem 120 mg  daily   Hyperlipidemia -Continue Crestor   Depression/anxiety -Klonopin 1 mg twice daily   Paroxysmal A-fib  - Rates controlled on Cardizem and Eliquis   DVT prophylaxis: Eliquis Code Status: No CPR Family Communication: None at bedside.  Status is: Inpatient Remains inpatient appropriate because: Maintain hospital stay on IV steroids due to significant amount of hypoxia and shortness of breath  Subjective:  Seen and examined.  No new complaint.  Appears better than yesterday.  Breathing is improving.  Examination:  General exam: Appears calm and comfortable  Respiratory system: Coarse breath sounds with rhonchi bilaterally. Respiratory effort normal. Cardiovascular system: S1 & S2 heard, RRR. No JVD, murmurs, rubs, gallops or clicks. No pedal edema. Gastrointestinal system: Abdomen is nondistended, soft and nontender. No organomegaly or masses felt. Normal bowel sounds heard. Central nervous system: Alert and oriented. No focal neurological deficits. Extremities: Symmetric 5 x 5 power. Skin: No rashes, lesions or ulcers.  Psychiatry: Judgement and insight appear normal. Mood & affect appropriate.    Objective: Vitals:   07/13/21 2103 07/14/21 0033 07/14/21 0457 07/14/21 0729  BP: 131/81 129/71 139/86 (!) 155/91  Pulse: 60 94 76 79  Resp: (!) 22 (!) 22 (!) 22 10  Temp: (!) 97.5 F (36.4 C) 97.6 F (36.4 C) 97.6 F (36.4 C) 97.9 F (36.6 C)  TempSrc: Oral Oral Oral Oral  SpO2: 98% 90% 96% 94%  Weight:  69.7 kg 70.7 kg   Height:        Intake/Output Summary (Last 24 hours) at  07/14/2021 1147 Last data filed at 07/14/2021 0851 Gross per 24 hour  Intake 394.02 ml  Output 1350 ml  Net -955.98 ml    Filed Weights   07/13/21 0005 07/14/21 0033 07/14/21 0457  Weight: 65.2 kg 69.7 kg 70.7 kg     Data Reviewed:   CBC: Recent Labs  Lab 07/22/2021 0703 07/18/2021 0716 07/09/21 0450 07/11/21 0404 07/13/21 0329  WBC 14.9*  --  8.6 13.7* 12.3*  HGB 13.0 14.6  11.7* 11.5* 11.6*  HCT 39.0 43.0 35.8* 35.2* 34.7*  MCV 99.2  --  98.6 98.9 98.0  PLT 233  --  203 297 062    Basic Metabolic Panel: Recent Labs  Lab 07/22/2021 0703 06/30/2021 0716 07/20/2021 1111 07/09/21 0450 07/10/21 0154 07/12/21 0253 07/13/21 0329  NA 137 139  --  137 138 138 138  K 3.5 3.5  --  4.5 4.1 5.0 4.6  CL 100  --   --  103 96* 100 101  CO2 28  --   --  25 33* 33* 31  GLUCOSE 100*  --   --  148* 141* 125* 136*  BUN 21  --   --  20 31* 23 23  CREATININE 1.25*  --   --  1.04 1.20 0.93 0.83  CALCIUM 9.0  --   --  8.8* 8.8* 8.5* 8.4*  MG  --   --  1.7  --  2.0  --  2.2    GFR: Estimated Creatinine Clearance: 81.6 mL/min (by C-G formula based on SCr of 0.83 mg/dL). Liver Function Tests: No results for input(s): AST, ALT, ALKPHOS, BILITOT, PROT, ALBUMIN in the last 168 hours. No results for input(s): LIPASE, AMYLASE in the last 168 hours. No results for input(s): AMMONIA in the last 168 hours. Coagulation Profile: No results for input(s): INR, PROTIME in the last 168 hours. Cardiac Enzymes: No results for input(s): CKTOTAL, CKMB, CKMBINDEX, TROPONINI in the last 168 hours. BNP (last 3 results) Recent Labs    05/29/21 1045 06/25/21 1439  PROBNP 926.0* 44.0    HbA1C: No results for input(s): HGBA1C in the last 72 hours. CBG: No results for input(s): GLUCAP in the last 168 hours. Lipid Profile: No results for input(s): CHOL, HDL, LDLCALC, TRIG, CHOLHDL, LDLDIRECT in the last 72 hours. Thyroid Function Tests: No results for input(s): TSH, T4TOTAL, FREET4, T3FREE, THYROIDAB in the last 72 hours. Anemia Panel: No results for input(s): VITAMINB12, FOLATE, FERRITIN, TIBC, IRON, RETICCTPCT in the last 72 hours. Sepsis Labs: Recent Labs  Lab 07/09/21 0450  PROCALCITON 0.50     Recent Results (from the past 240 hour(s))  Resp Panel by RT-PCR (Flu A&B, Covid) Nasopharyngeal Swab     Status: None   Collection Time: 06/25/2021  7:45 AM   Specimen: Nasopharyngeal  Swab; Nasopharyngeal(NP) swabs in vial transport medium  Result Value Ref Range Status   SARS Coronavirus 2 by RT PCR NEGATIVE NEGATIVE Final    Comment: (NOTE) SARS-CoV-2 target nucleic acids are NOT DETECTED.  The SARS-CoV-2 RNA is generally detectable in upper respiratory specimens during the acute phase of infection. The lowest concentration of SARS-CoV-2 viral copies this assay can detect is 138 copies/mL. A negative result does not preclude SARS-Cov-2 infection and should not be used as the sole basis for treatment or other patient management decisions. A negative result may occur with  improper specimen collection/handling, submission of specimen other than nasopharyngeal swab, presence of viral mutation(s) within the areas targeted by this assay, and  inadequate number of viral copies(<138 copies/mL). A negative result must be combined with clinical observations, patient history, and epidemiological information. The expected result is Negative.  Fact Sheet for Patients:  EntrepreneurPulse.com.au  Fact Sheet for Healthcare Providers:  IncredibleEmployment.be  This test is no t yet approved or cleared by the [place] FDA and  has been authorized for detection and/or diagnosis of SARS-CoV-2 by FDA under an Emergency Use Authorization (EUA). This EUA will remain  in effect (meaning this test can be used) for the duration of the COVID-19 declaration under Section 564(b)(1) of the Act, 21 U.S.C.section 360bbb-3(b)(1), unless the authorization is terminated  or revoked sooner.       Influenza A by PCR NEGATIVE NEGATIVE Final   Influenza B by PCR NEGATIVE NEGATIVE Final    Comment: (NOTE) The Xpert Xpress SARS-CoV-2/FLU/RSV plus assay is intended as an aid in the diagnosis of influenza from Nasopharyngeal swab specimens and should not be used as a sole basis for treatment. Nasal washings and aspirates are unacceptable for Xpert Xpress  SARS-CoV-2/FLU/RSV testing.  Fact Sheet for Patients: EntrepreneurPulse.com.au  Fact Sheet for Healthcare Providers: IncredibleEmployment.be  This test is not yet approved or cleared by the [place] FDA and has been authorized for detection and/or diagnosis of SARS-CoV-2 by FDA under an Emergency Use Authorization (EUA). This EUA will remain in effect (meaning this test can be used) for the duration of the COVID-19 declaration under Section 564(b)(1) of the Act, 21 U.S.C. section 360bbb-3(b)(1), unless the authorization is terminated or revoked.  Performed at [facility], [place] [address], [place], [place] [address]   Expectorated Sputum Assessment w Gram Stain, Rflx to Resp Cult     Status: None   Collection Time: 07/18/2021 10:25 AM   Specimen: Sputum  Result Value Ref Range Status   Specimen Description SPUTUM  Final   Special Requests NONE  Final   Sputum evaluation   Final    Sputum specimen not acceptable for testing.  Please recollect.   Gram Stain Report Called to,Read Back By and Verified With: RN Octavio Manns [phone] '@2326'$  FH Performed at [facility], [place] [address] [place], [place] [address]    Report Status 06/28/2021 FINAL  Final  MRSA Next Gen by PCR, Nasal     Status: None   Collection Time: 07/11/2021  5:42 PM   Specimen: Nasal Mucosa; Nasal Swab  Result Value Ref Range Status   MRSA by PCR Next Gen NOT DETECTED NOT DETECTED Final    Comment: (NOTE) The GeneXpert MRSA Assay (FDA approved for NASAL specimens only), is one component of a comprehensive MRSA colonization surveillance program. It is not intended to diagnose MRSA infection nor to guide or monitor treatment for MRSA infections. Test performance is not FDA approved in patients less than 65 years old. Performed at [facility], [place] [address], [place], [place] [address]           Radiology Studies: No results found.      Scheduled  Meds:  apixaban  5 mg Oral BID   clonazePAM  1 mg Oral BID   diltiazem  120 mg Oral q AM   docusate sodium  100 mg Oral BID   gabapentin  600 mg Oral BID   mouth rinse  15 mL Mouth Rinse BID   methylPREDNISolone (SOLU-MEDROL) injection  80 mg Intravenous Q8H   Nintedanib  150 mg Oral BID   pantoprazole  40 mg Oral QHS  psyllium  1 packet Oral QHS   rosuvastatin  20 mg Oral Daily   sodium chloride flush  3 mL Intravenous Q12H   Continuous Infusions:  sodium chloride Stopped (07/14/21 0610)   ceFEPime (MAXIPIME) IV 2 g (07/14/21 1012)     LOS: 6 days    Darliss Cheney, MD [facility]  If 7PM-7AM, please contact night-coverage  07/14/2021, 11:47 AM

## 2021-07-15 DIAGNOSIS — J9611 Chronic respiratory failure with hypoxia: Secondary | ICD-10-CM | POA: Diagnosis not present

## 2021-07-15 DIAGNOSIS — J84112 Idiopathic pulmonary fibrosis: Secondary | ICD-10-CM | POA: Diagnosis not present

## 2021-07-15 DIAGNOSIS — J9621 Acute and chronic respiratory failure with hypoxia: Secondary | ICD-10-CM | POA: Diagnosis not present

## 2021-07-15 DIAGNOSIS — I480 Paroxysmal atrial fibrillation: Secondary | ICD-10-CM | POA: Diagnosis not present

## 2021-07-15 DIAGNOSIS — F4001 Agoraphobia with panic disorder: Secondary | ICD-10-CM | POA: Diagnosis not present

## 2021-07-15 LAB — BASIC METABOLIC PANEL
Anion gap: 4 — ABNORMAL LOW (ref 5–15)
BUN: 23 mg/dL (ref 8–23)
CO2: 32 mmol/L (ref 22–32)
Calcium: 8 mg/dL — ABNORMAL LOW (ref 8.9–10.3)
Chloride: 104 mmol/L (ref 98–111)
Creatinine, Ser: 0.82 mg/dL (ref 0.61–1.24)
GFR, Estimated: >60 mL/min (ref >60)
Glucose, Bld: 146 mg/dL — ABNORMAL HIGH (ref 70–99)
Potassium: 4 mmol/L (ref 3.5–5.1)
Sodium: 140 mmol/L (ref 135–145)

## 2021-07-15 LAB — CBC
HCT: 34.2 % — ABNORMAL LOW (ref 39.0–52.0)
Hemoglobin: 11.6 g/dL — ABNORMAL LOW (ref 13.0–17.0)
MCH: 32.9 pg (ref 26.0–34.0)
MCHC: 33.9 g/dL (ref 30.0–36.0)
MCV: 96.9 fL (ref 80.0–100.0)
Platelets: 276 10*3/uL (ref 150–400)
RBC: 3.53 MIL/uL — ABNORMAL LOW (ref 4.22–5.81)
RDW: 14.7 % (ref 11.5–15.5)
WBC: 13.8 10*3/uL — ABNORMAL HIGH (ref 4.0–10.5)
nRBC: 0 % (ref 0.0–0.2)

## 2021-07-15 NOTE — Progress Notes (Signed)
                                                                                                                                                                                                          Palliative Medicine Progress Note   Patient Name: Eduardo Fuentes       Date: 07/15/2021 DOB: 1951/01/15  Age: 71 y.o. MRN#: [id number] Attending Physician: Darliss Cheney, MD Primary Care Physician: Lujean Amel, MD Admit Date: 07/20/2021  Reason for Consultation/Follow-up: {Reason for Consult:23484}  HPI/Patient Profile: ***  Subjective: ***  Objective:  Physical Exam          Vital Signs: BP (!) 147/82   Pulse 66   Temp 97.6 F (36.4 C) (Oral)   Resp 20   Ht '5\' 9"'$  (1.753 m)   Wt 69.3 kg   SpO2 93%   BMI 22.56 kg/m  SpO2: SpO2: 93 % O2 Device: O2 Device: High Flow Nasal Cannula O2 Flow Rate: O2 Flow Rate (L/min): 9 L/min  Intake/output summary:  Intake/Output Summary (Last 24 hours) at 07/15/2021 1559 Last data filed at 07/15/2021 0442 Gross per 24 hour  Intake 237 ml  Output 400 ml  Net -163 ml    LBM: Last BM Date : 07/14/21     Palliative Assessment/Data: ***     Palliative Medicine Assessment & Plan   Assessment: Principal Problem:   Chronic respiratory failure with hypoxia (HCC) Active Problems:   Major depressive disorder, recurrent episode (HCC)   Panic disorder with agoraphobia   IPF (idiopathic pulmonary fibrosis) (HCC)   Paroxysmal atrial fibrillation (HCC) CHA2VASC2 = 3; on Eliiquis   Prolonged QT interval   Community acquired pneumonia    Recommendations/Plan: ***  Goals of Care and Additional Recommendations: Limitations on Scope of Treatment: {Recommended Scope and Preferences:21019}  Code Status:   Prognosis:  {Palliative Care Prognosis:23504}  Discharge Planning: {Palliative dispostion:23505}  Care plan was discussed with ***  Thank you for allowing the Palliative Medicine Team to assist in the care of this  patient.   ***   Lavena Bullion, NP   Please contact Palliative Medicine Team phone at [phone] for questions and concerns.  For individual providers, please see AMION.

## 2021-07-15 NOTE — Progress Notes (Signed)
PROGRESS NOTE    Eduardo Fuentes  ZOX:[id number] DOB: 01-Sep-1950 DOA: 07/07/2021 PCP: Lujean Amel, MD   Brief Narrative:  71 year old male with HTN, pulmonary fibrosis on 8 L with activities at home, prostate cancer, comes into the hospital with shortness of breath.  He has been having IPF for a number of years, but over the last several months he became worse, and started on oxygen about 3 months ago.  He has been on 2 to 3 L, but when seen by pulmonology last Friday he was decreased to 8 L with exertion.  He continued to progress getting worse at home and eventually came to the hospital.  Sats were at 60% despite nasal cannula.  Pulmonary team has been consulted.   Assessment & Plan:  Principal Problem:   Chronic respiratory failure with hypoxia (HCC) Active Problems:   Major depressive disorder, recurrent episode (HCC)   Panic disorder with agoraphobia   IPF (idiopathic pulmonary fibrosis) (HCC)   Paroxysmal atrial fibrillation (HCC) CHA2VASC2 = 3; on Eliiquis   Prolonged QT interval   Community acquired pneumonia     Acute on chronic respiratory failure with hypoxia Pulmonary fibrosis flare -Likely secondary to progression of IPF with superimposed pneumonia.  There is low suspicion for pulmonary embolism.  Continue supplemental oxygen and current antibiotics. After his flare in April he was noted to require 2 L of oxygen at rest and up to 8 L on exertion.  -Seen by pulmonary, due to high amount of oxygen requirement, he was started on high-dose of Solu-Medrol and the plan to continue this for at least 3 days and they will reassess him on Monday.  Some improvement compared to yesterday.  Currently on 10 L of oxygen. -Currently on ofev, steroids.  Other as needed bronchodilators, Mucinex.  Awaiting pulmonology for further guidance.   Prolonged QTc -Continue to monitor.  Avoid QTc prolonging medications.  Keep electrolytes within normal range.  Essential hypertension - Blood  pressure controlled.  Continue Cardizem 120 mg daily   Hyperlipidemia -Continue Crestor   Depression/anxiety -Klonopin 1 mg twice daily   Paroxysmal A-fib  - Rates controlled on Cardizem and Eliquis  Diarrhea: Patient now tells me that he has been having diarrhea since Saturday.  About 2-3 bowel movements a day.  Unfortunately, he is allergic to Lomotil and Imodium both.  Likely secondary to Ofev.  I do not suspect C. difficile infection.  We will watch for now.   DVT prophylaxis: Eliquis Code Status: No CPR Family Communication: Wife at bedside.  Status is: Inpatient Remains inpatient appropriate because: Maintain hospital stay on IV steroids due to significant amount of hypoxia and shortness of breath  Subjective:  Seen and examined.  Breathing still the same with no improvement compared to yesterday.  Complains of diarrhea.  Examination:  General exam: Appears calm and comfortable  Respiratory system: Coarse breath sounds with rhonchi bilaterally.  Respiratory effort normal. Cardiovascular system: S1 & S2 heard, RRR. No JVD, murmurs, rubs, gallops or clicks. No pedal edema. Gastrointestinal system: Abdomen is nondistended, soft and nontender. No organomegaly or masses felt. Normal bowel sounds heard. Central nervous system: Alert and oriented. No focal neurological deficits. Extremities: Symmetric 5 x 5 power. Skin: No rashes, lesions or ulcers.  Psychiatry: Judgement and insight appear normal. Mood & affect appropriate.   Objective: Vitals:   07/15/21 0041 07/15/21 0405 07/15/21 0437 07/15/21 0438  BP:  125/77 134/71   Pulse:  61 73 69  Resp:  (!) 25 (!)  27 (!) 25  Temp:  97.6 F (36.4 C) (!) 97.5 F (36.4 C)   TempSrc:  Oral Oral   SpO2:  95% 93% 95%  Weight: 71.3 kg  69.3 kg   Height:        Intake/Output Summary (Last 24 hours) at 07/15/2021 1048 Last data filed at 07/15/2021 0442 Gross per 24 hour  Intake 237 ml  Output 625 ml  Net -388 ml    Filed  Weights   07/14/21 0457 07/15/21 0041 07/15/21 0437  Weight: 70.7 kg 71.3 kg 69.3 kg     Data Reviewed:   CBC: Recent Labs  Lab 07/09/21 0450 07/11/21 0404 07/13/21 0329 07/15/21 0245  WBC 8.6 13.7* 12.3* 13.8*  HGB 11.7* 11.5* 11.6* 11.6*  HCT 35.8* 35.2* 34.7* 34.2*  MCV 98.6 98.9 98.0 96.9  PLT 203 297 280 093    Basic Metabolic Panel: Recent Labs  Lab 07/03/2021 1111 07/09/21 0450 07/10/21 0154 07/12/21 0253 07/13/21 0329 07/15/21 0245  NA  --  137 138 138 138 140  K  --  4.5 4.1 5.0 4.6 4.0  CL  --  103 96* 100 101 104  CO2  --  25 33* 33* 31 32  GLUCOSE  --  148* 141* 125* 136* 146*  BUN  --  20 31* '23 23 23  '$ CREATININE  --  1.04 1.20 0.93 0.83 0.82  CALCIUM  --  8.8* 8.8* 8.5* 8.4* 8.0*  MG 1.7  --  2.0  --  2.2  --     GFR: Estimated Creatinine Clearance: 81 mL/min (by C-G formula based on SCr of 0.82 mg/dL). Liver Function Tests: No results for input(s): AST, ALT, ALKPHOS, BILITOT, PROT, ALBUMIN in the last 168 hours. No results for input(s): LIPASE, AMYLASE in the last 168 hours. No results for input(s): AMMONIA in the last 168 hours. Coagulation Profile: No results for input(s): INR, PROTIME in the last 168 hours. Cardiac Enzymes: No results for input(s): CKTOTAL, CKMB, CKMBINDEX, TROPONINI in the last 168 hours. BNP (last 3 results) Recent Labs    05/29/21 1045 06/25/21 1439  PROBNP 926.0* 44.0    HbA1C: No results for input(s): HGBA1C in the last 72 hours. CBG: No results for input(s): GLUCAP in the last 168 hours. Lipid Profile: No results for input(s): CHOL, HDL, LDLCALC, TRIG, CHOLHDL, LDLDIRECT in the last 72 hours. Thyroid Function Tests: No results for input(s): TSH, T4TOTAL, FREET4, T3FREE, THYROIDAB in the last 72 hours. Anemia Panel: No results for input(s): VITAMINB12, FOLATE, FERRITIN, TIBC, IRON, RETICCTPCT in the last 72 hours. Sepsis Labs: Recent Labs  Lab 07/09/21 0450  PROCALCITON 0.50     Recent Results (from  the past 240 hour(s))  Resp Panel by RT-PCR (Flu A&B, Covid) Nasopharyngeal Swab     Status: None   Collection Time: 07/04/2021  7:45 AM   Specimen: Nasopharyngeal Swab; Nasopharyngeal(NP) swabs in vial transport medium  Result Value Ref Range Status   SARS Coronavirus 2 by RT PCR NEGATIVE NEGATIVE Final    Comment: (NOTE) SARS-CoV-2 target nucleic acids are NOT DETECTED.  The SARS-CoV-2 RNA is generally detectable in upper respiratory specimens during the acute phase of infection. The lowest concentration of SARS-CoV-2 viral copies this assay can detect is 138 copies/mL. A negative result does not preclude SARS-Cov-2 infection and should not be used as the sole basis for treatment or other patient management decisions. A negative result may occur with  improper specimen collection/handling, submission of specimen other than nasopharyngeal  swab, presence of viral mutation(s) within the areas targeted by this assay, and inadequate number of viral copies(<138 copies/mL). A negative result must be combined with clinical observations, patient history, and epidemiological information. The expected result is Negative.  Fact Sheet for Patients:  EntrepreneurPulse.com.au  Fact Sheet for Healthcare Providers:  IncredibleEmployment.be  This test is no t yet approved or cleared by the [place] FDA and  has been authorized for detection and/or diagnosis of SARS-CoV-2 by FDA under an Emergency Use Authorization (EUA). This EUA will remain  in effect (meaning this test can be used) for the duration of the COVID-19 declaration under Section 564(b)(1) of the Act, 21 U.S.C.section 360bbb-3(b)(1), unless the authorization is terminated  or revoked sooner.       Influenza A by PCR NEGATIVE NEGATIVE Final   Influenza B by PCR NEGATIVE NEGATIVE Final    Comment: (NOTE) The Xpert Xpress SARS-CoV-2/FLU/RSV plus assay is intended as an aid in the diagnosis of  influenza from Nasopharyngeal swab specimens and should not be used as a sole basis for treatment. Nasal washings and aspirates are unacceptable for Xpert Xpress SARS-CoV-2/FLU/RSV testing.  Fact Sheet for Patients: EntrepreneurPulse.com.au  Fact Sheet for Healthcare Providers: IncredibleEmployment.be  This test is not yet approved or cleared by the [place] FDA and has been authorized for detection and/or diagnosis of SARS-CoV-2 by FDA under an Emergency Use Authorization (EUA). This EUA will remain in effect (meaning this test can be used) for the duration of the COVID-19 declaration under Section 564(b)(1) of the Act, 21 U.S.C. section 360bbb-3(b)(1), unless the authorization is terminated or revoked.  Performed at [facility], [place] [address], [place], [place] [address]   Expectorated Sputum Assessment w Gram Stain, Rflx to Resp Cult     Status: None   Collection Time: 07/22/2021 10:25 AM   Specimen: Sputum  Result Value Ref Range Status   Specimen Description SPUTUM  Final   Special Requests NONE  Final   Sputum evaluation   Final    Sputum specimen not acceptable for testing.  Please recollect.   Gram Stain Report Called to,Read Back By and Verified With: RN AMancel Bale [phone]'$  FH Performed at [facility], [place] [address], [place], [place] [address]    Report Status 07/16/2021 FINAL  Final  MRSA Next Gen by PCR, Nasal     Status: None   Collection Time: 06/27/2021  5:42 PM   Specimen: Nasal Mucosa; Nasal Swab  Result Value Ref Range Status   MRSA by PCR Next Gen NOT DETECTED NOT DETECTED Final    Comment: (NOTE) The GeneXpert MRSA Assay (FDA approved for NASAL specimens only), is one component of a comprehensive MRSA colonization surveillance program. It is not intended to diagnose MRSA infection nor to guide or monitor treatment for MRSA infections. Test performance is not FDA approved in patients less than 64  years old. Performed at [facility], [place] [address] [place], [place] [address]           Radiology Studies: No results found.      Scheduled Meds:  apixaban  5 mg Oral BID   clonazePAM  1 mg Oral BID   diltiazem  120 mg Oral q AM   docusate sodium  100 mg Oral BID   gabapentin  600 mg Oral BID   mouth rinse  15 mL Mouth Rinse BID   methylPREDNISolone (SOLU-MEDROL) injection  80 mg Intravenous Q8H   Nintedanib  150 mg Oral BID   pantoprazole  40 mg Oral QHS   psyllium  1 packet Oral QHS   rosuvastatin  20 mg Oral Daily   sodium chloride flush  3 mL Intravenous Q12H   Continuous Infusions:  sodium chloride Stopped (07/14/21 0610)   ceFEPime (MAXIPIME) IV 2 g (07/15/21 0219)     LOS: 7 days    Darliss Cheney, MD [facility]  If 7PM-7AM, please contact night-coverage  07/15/2021, 10:48 AM

## 2021-07-15 NOTE — Care Management Important Message (Signed)
Important Message  Patient Details  Name: Eduardo Fuentes MRN: [id number] Date of Birth: 1950/03/05   Medicare Important Message Given:  Yes     Shelda Altes 07/15/2021, 7:41 AM

## 2021-07-15 NOTE — Progress Notes (Addendum)
NAME:  Eduardo Fuentes, MRN:  [id number], DOB:  1950-12-09, LOS: 7 ADMISSION DATE:  07/07/2021, CONSULTATION DATE:  07/11/2021 REFERRING MD:  Masters, CHIEF COMPLAINT:  Hypoxemia, IPF, Pneumonia, Heart Failure   History of Present Illness:  71 year old male, never smoker with history of IPF on Ofev as an outpatient with recent admission last month for IPF exacerbation, elevated troponins.  He had a cardiac cath during that admission which showed nonobstructive coronary artery disease. Now readmitted with worsening dyspnea, hypoxia to the 50s.  Chest x-ray showed new right lung infiltrates.  After his flare in April he was noted to require 2 L of oxygen at rest and up to 8 L on exertion   Pertinent  Medical History   PMH significant for coronary artery calcification, afib, IPF, prostate CA, hyperlipidemia, GERD with sliding hiatal hernia, GAD, panic disorder with agoraphobia.   Past Medical History:  Diagnosis Date   Agatston CAC score, <100 04/2018   Arthritis    NECK   Cough    NON-PRODUCTIVE   Depressive disorder 10/2011   follow by dr [facility] psychiatry; has anxiety and panic disorder as well.   Diverticulosis    Diverticulosis of colon    Followed by Dr. Henrene Pastor   Essential hypertension    Generalized anxiety disorder    GERD (gastroesophageal reflux disease)    WATCHES DIET   Hemorrhoid    Hiatal hernia    Describes a sliding hiatal hernia   History of colon polyps    History of kidney stones    Idiopathic pulmonary fibrosis ([place])    Microscopic hematuria    Nocturia    Panic attacks    Prostate cancer ([place]) 07/2012   Dr. Arnell Sieving urology.   Sinus infection    Tinnitus    LEFT EAR -- CHRONIC     Significant Hospital Events: Including procedures, antibiotic start and stop dates in addition to other pertinent events   Admitted 05/29/2021 for IPF/ CHF flare Admission 07/07/2021 with Pneumonia, CHF  Vanc and Cefepime initiated 07/11/2021   Cardiac Cath  05/30/2021 The left ventricular systolic function is normal.   LV end diastolic pressure is normal.   The left ventricular ejection fraction is 50-55% by visual estimate.   Hemodynamic findings consistent with mild pulmonary hypertension.   There is no aortic valve stenosis.   Mild, diffuse nonobstructive coronary artery disease.   Aortic saturation 94%, PA saturation 65%, PA pressure 55/21, mean PA pressure 34 mmHg, pulmonary capillary wedge pressure 9 mmHg, cardiac output 4.75 L/min, cardiac index 2.5.  Elevated troponin likely from demand ischemia.  Nonobstructive, minimal CAD.  Continue medical therapy.      Interim History / Subjective:  SOB slightly better over the past few days. Still significant DOE with activity, but ok sitting in bed.  He remains on high dose steroids.   Objective   Blood pressure 134/71, pulse 69, temperature (!) 97.5 F (36.4 C), temperature source Oral, resp. rate (!) 25, height '5\' 9"'$  (1.753 m), weight 69.3 kg, SpO2 95 %.        Intake/Output Summary (Last 24 hours) at 07/15/2021 1101 Last data filed at 07/15/2021 0442 Gross per 24 hour  Intake 237 ml  Output 625 ml  Net -388 ml    Filed Weights   07/14/21 0457 07/15/21 0041 07/15/21 0437  Weight: 70.7 kg 71.3 kg 69.3 kg    Examination: General:   elderly man sitting up in bed in NAD HEENT: [place]/AT, eyes  anicteric Neuro: Awake, alert, moving extremities. Normal speech.  CV: S1S2, RRR PULM: breathing comfortably on 9L [place], no accessory muscle use or conversational dyspnea. Mild tachypnea. Fine rhales bilaterally, R>L GI: soft, NT Extremities: no peripheral edema, no cyanosis Skin: no rashes   WBC 13.8 H/H 11.6/34.2  CXR 5/19 personally reviewed> entire R lung, LLL opacified, chronic fibrotic changes most prominent in bases  Resolved Hospital Problem list     Assessment & Plan:  Acute on chronic hypoxemic respiratory failure- with background of IPF, worsening R sided haziness.  Viral  studies neg, MRSA PCR neg.  Aspiration vs. AE-IPF vs. Pulmonary edema vs. CAP vs. Some combination.  Unfortunately his persistent hypoxia, coarse, low procalcitonin all points to an IPF exacerbation P: -con't supplemental O2 to maintain SpO2 >88%; will need more oxygen to walk around -Con't high dose steroids-- subjectively feels better and down to 9L. Not a dramatic improvement. -complete 7 days of antibiotics then stop -He has been explained that this is likely an acute flare of his IPF, which carries a poor prognosis previously. If he does not make improvements, this is likely chronic progression of fibrosis. Ongoing pallaitive care discussions are appropriate. If his respiratory status worsens, I would focus on comfort as the prognosis is very grim. -mobility, maintain adequate nutrition  PCCM will evaluate again on Wednesday. Wife updated during rounds.   Julian Hy, DO 07/15/21 11:04 AM [facility]

## 2021-07-15 NOTE — Plan of Care (Signed)
  Problem: Activity: Goal: Ability to tolerate increased activity will improve Outcome: Progressing   Problem: Clinical Measurements: Goal: Ability to maintain a body temperature in the normal range will improve Outcome: Progressing   Problem: Respiratory: Goal: Ability to maintain adequate ventilation will improve Outcome: Progressing Goal: Ability to maintain a clear airway will improve Outcome: Progressing   

## 2021-07-15 NOTE — Progress Notes (Signed)
Physical Therapy Treatment Patient Details Name: Eduardo Fuentes MRN: [id number] DOB: 04-May-1950 Today's Date: 07/15/2021   History of Present Illness Pt is a 71 y.o. male who presented 07/16/2021 with SOB and hypoxia to the 50s. Pt admitted with acute on chronic hypoxemia respiratory failure ;ikely secondary to PNA vs IFP exacerbation. PMH: coronary artery calcification, afib, IPF, prostate CA, hyperlipidemia, GERD with sliding hiatal hernia, GAD, panic disorder with agoraphobia    PT Comments    The pt was agreeable to session, but requests to defer OOB mobility to save energy for him to eat dinner soon. Pt able to complete bed mobility without physical assist, requires use of bed rails and increased time as well as 3-5 min to recover from exertion. He then completed multiple LE exercises against min resistance, with 3-5 min to recover between each set. SpO2 consistently to low 80s on 9-10L O2 with activity, recovers to 90s with seated rest and PLB. The pt was able to come to partial stand x3 without assist to reposition at EOB, encouraged to complete OOB mobility 1x/day to improve activity tolerance and pt in agreement with plan.     Recommendations for follow up therapy are one component of a multi-disciplinary discharge planning process, led by the attending physician.  Recommendations may be updated based on patient status, additional functional criteria and insurance authorization.  Follow Up Recommendations  Home health PT     Assistance Recommended at Discharge Intermittent Supervision/Assistance  Patient can return home with the following A little help with walking and/or transfers;Help with stairs or ramp for entrance;Assist for transportation;Assistance with cooking/housework;A little help with bathing/dressing/bathroom   Equipment Recommendations  Rollator (4 wheels);Other (comment) (tub bench)    Recommendations for Other Services       Precautions / Restrictions  Precautions Precautions: Fall;Other (comment) Precaution Comments: watch SpO2, to 10L O2 this session Restrictions Weight Bearing Restrictions: No     Mobility  Bed Mobility Overal bed mobility: Needs Assistance Bed Mobility: Supine to Sit, Sit to Supine     Supine to sit: Min guard, HOB elevated Sit to supine: Min guard   General bed mobility comments: minG to complete, cues for use of UE on bed rails to complete transition to sitting EOB, able to return to flat bed without assist    Transfers                   General transfer comment: declined OOB transfer, but was able to complete multiple squats from EOB with good hip clearance to scoot laterally along EOB, no assist given    Ambulation/Gait               General Gait Details: declined OOB due to fatigue     Balance Overall balance assessment: Needs assistance Sitting-balance support: No upper extremity supported, Feet supported Sitting balance-Leahy Scale: Good                                      Cognition Arousal/Alertness: Awake/alert Behavior During Therapy: WFL for tasks assessed/performed Overall Cognitive Status: Within Functional Limits for tasks assessed                                 General Comments: pt motivated, but also self-limiting at time to avoid fatigue        Exercises General  Exercises - Lower Extremity Long Arc Quad: AAROM, Both, 5 reps, Seated (against min resistance) Heel Slides: Strengthening, Both, 5 reps, Seated (against min resistance) Heel Raises: AROM, Both, 15 reps, Seated    General Comments General comments (skin integrity, edema, etc.): SpO2 to low of 81% on 9L, requires 3-5 min seated rest to recover to 90s.      Pertinent Vitals/Pain Pain Assessment Pain Assessment: No/denies pain Pain Intervention(s): Monitored during session           PT Goals (current goals can now be found in the care plan section) Acute Rehab PT  Goals Patient Stated Goal: to improve his pulmonary function PT Goal Formulation: With patient Time For Goal Achievement: 07/31/21 Potential to Achieve Goals: Good Progress towards PT goals: Progressing toward goals    Frequency    Min 3X/week      PT Plan Current plan remains appropriate       AM-PAC PT "6 Clicks" Mobility   Outcome Measure  Help needed turning from your back to your side while in a flat bed without using bedrails?: A Little Help needed moving from lying on your back to sitting on the side of a flat bed without using bedrails?: A Little Help needed moving to and from a bed to a chair (including a wheelchair)?: A Little Help needed standing up from a chair using your arms (e.g., wheelchair or bedside chair)?: A Little Help needed to walk in hospital room?: A Little Help needed climbing 3-5 steps with a railing? : A Little 6 Click Score: 18    End of Session Equipment Utilized During Treatment: Oxygen Activity Tolerance:  (limited by poor endurance and SpO2 drops) Patient left: in bed;with call bell/phone within reach;with bed alarm set Nurse Communication: Mobility status PT Visit Diagnosis: Unsteadiness on feet (R26.81);Other abnormalities of gait and mobility (R26.89);Muscle weakness (generalized) (M62.81);Difficulty in walking, not elsewhere classified (R26.2)     Time: 4656-8127 PT Time Calculation (min) (ACUTE ONLY): 21 min  Charges:  $Therapeutic Exercise: 8-22 mins                     [name], PT, DPT   Acute Rehabilitation Department Pager #: [phone]   [name] 07/15/2021, 5:58 PM

## 2021-07-15 NOTE — Progress Notes (Signed)
Occupational Therapy Treatment Patient Details Name: Eduardo Fuentes MRN: [id number] DOB: 01-20-1951 Today's Date: 07/15/2021   History of present illness Pt is a 71 y.o. male who presented 07/02/2021 with SOB and hypoxia to the 50s. Pt admitted with acute on chronic hypoxemia respiratory failure ;ikely secondary to PNA vs IFP exacerbation. PMH: coronary artery calcification, afib, IPF, prostate CA, hyperlipidemia, GERD with sliding hiatal hernia, GAD, panic disorder with agoraphobia   OT comments  Patient continues to make incremental progress towards goals in skilled OT session. Patient's session encompassed bed level exercises due to fatigue after frequent bouts of diarrhea over the weekend. Patient agreeable to OOB movement with PT later in the afternoon. Patient with minimal increase in RR when performing exercises, benefiting from cues with regard to pursed lip breathing. VSS at 9L HFNC throughout session, OT will continue to follow acutely.    Recommendations for follow up therapy are one component of a multi-disciplinary discharge planning process, led by the attending physician.  Recommendations may be updated based on patient status, additional functional criteria and insurance authorization.    Follow Up Recommendations  Home health OT    Assistance Recommended at Discharge Frequent or constant Supervision/Assistance  Patient can return home with the following  A little help with walking and/or transfers;A little help with bathing/dressing/bathroom;Assistance with cooking/housework;Assist for transportation;Help with stairs or ramp for entrance   Equipment Recommendations  None recommended by OT (Patient has DME needed)    Recommendations for Other Services      Precautions / Restrictions Precautions Precautions: Fall;Other (comment) Precaution Comments: watch SpO2 Restrictions Weight Bearing Restrictions: No       Mobility Bed Mobility               General bed  mobility comments: Declined due to [place] transfer comment: Declined due to [place]                                           ADL either performed or assessed with clinical judgement   ADL Overall ADL's : Needs assistance/impaired                                       General ADL Comments: Session focus on bed level exercises due to increased fatigue in session    Extremity/Trunk Assessment              Vision       Perception     Praxis      Cognition Arousal/Alertness: Awake/alert Behavior During Therapy: WFL for tasks assessed/performed Overall Cognitive Status: Within Functional Limits for tasks assessed                                          Exercises General Exercises - Lower Extremity Ankle Circles/Pumps: AROM, Both, 20 reps, Supine Quad Sets: AROM, Both, 10 reps, Supine Straight Leg Raises: AROM, Both, 15 reps, Supine    Shoulder Instructions       General Comments      Pertinent Vitals/ Pain  Pain Assessment Pain Assessment: No/denies pain  Home Living                                          Prior Functioning/Environment              Frequency  Min 2X/week        Progress Toward Goals  OT Goals(current goals can now be found in the care plan section)  Progress towards OT goals: Progressing toward goals  Acute Rehab OT Goals Patient Stated Goal: to feel better OT Goal Formulation: With patient/family Time For Goal Achievement: 07/26/21 Potential to Achieve Goals: Good  Plan Discharge plan remains appropriate    Co-evaluation                 AM-PAC OT "6 Clicks" Daily Activity     Outcome Measure   Help from another person eating meals?: A Little Help from another person taking care of personal grooming?: A Little Help from another person toileting, which includes using  toliet, bedpan, or urinal?: A Lot Help from another person bathing (including washing, rinsing, drying)?: A Little Help from another person to put on and taking off regular upper body clothing?: A Little Help from another person to put on and taking off regular lower body clothing?: A Little 6 Click Score: 17    End of Session Equipment Utilized During Treatment: Oxygen  OT Visit Diagnosis: Unsteadiness on feet (R26.81);Other abnormalities of gait and mobility (R26.89);Muscle weakness (generalized) (M62.81)   Activity Tolerance Patient tolerated treatment well   Patient Left in bed;with call bell/phone within reach;with family/visitor present   Nurse Communication Mobility status        Time: 1050-1110 OT Time Calculation (min): 20 min  Charges: OT General Charges $OT Visit: 1 Visit OT Treatments $Therapeutic Exercise: 8-22 mins  Corinne Ports E. Jamarcus Laduke, OTR/L Acute Rehabilitation Services [phone] [place] 07/15/2021, 11:25 AM

## 2021-07-16 DIAGNOSIS — J9611 Chronic respiratory failure with hypoxia: Secondary | ICD-10-CM | POA: Diagnosis not present

## 2021-07-16 MED ORDER — LOPERAMIDE HCL 2 MG PO CAPS
2.0000 mg | ORAL_CAPSULE | ORAL | Status: DC | PRN
  Administered 2021-07-16 – 2021-07-20 (×5): 2 mg via ORAL
  Filled 2021-07-16 (×6): qty 1

## 2021-07-16 MED ORDER — PREDNISONE 20 MG PO TABS
40.0000 mg | ORAL_TABLET | Freq: Every day | ORAL | Status: DC
  Administered 2021-07-17 – 2021-07-24 (×8): 40 mg via ORAL
  Filled 2021-07-16 (×9): qty 2

## 2021-07-16 MED ORDER — METHYLPREDNISOLONE SODIUM SUCC 125 MG IJ SOLR
80.0000 mg | Freq: Three times a day (TID) | INTRAMUSCULAR | Status: AC
  Administered 2021-07-16 (×2): 80 mg via INTRAVENOUS
  Filled 2021-07-16 (×2): qty 2

## 2021-07-16 NOTE — Plan of Care (Signed)
  Problem: Activity: Goal: Ability to tolerate increased activity will improve Outcome: Progressing   Problem: Clinical Measurements: Goal: Ability to maintain a body temperature in the normal range will improve Outcome: Progressing   Problem: Respiratory: Goal: Ability to maintain adequate ventilation will improve Outcome: Progressing Goal: Ability to maintain a clear airway will improve Outcome: Progressing   

## 2021-07-16 NOTE — Progress Notes (Signed)
PROGRESS NOTE    Eduardo Fuentes  KYH:[id number] DOB: 02/22/1951 DOA: 07/20/2021 PCP: Lujean Amel, MD   Brief Narrative:  71 year old male with HTN, pulmonary fibrosis on 8 L with activities at home, prostate cancer, comes into the hospital with shortness of breath.  He has been having IPF for a number of years, but over the last several months he became worse, and started on oxygen about 3 months ago.  He has been on 2 to 3 L, but when seen by pulmonology last Friday he was decreased to 8 L with exertion.  He continued to progress getting worse at home and eventually came to the hospital.  Sats were at 60% despite nasal cannula.  Pulmonary team has been consulted.   Assessment & Plan:  Principal Problem:   Chronic respiratory failure with hypoxia (HCC) Active Problems:   Major depressive disorder, recurrent episode (HCC)   Panic disorder with agoraphobia   IPF (idiopathic pulmonary fibrosis) (HCC)   Paroxysmal atrial fibrillation (HCC) CHA2VASC2 = 3; on Eliiquis   Prolonged QT interval   Community acquired pneumonia     Acute on chronic respiratory failure with hypoxia Pulmonary fibrosis flare -Likely secondary to progression of IPF with superimposed pneumonia.  There is low suspicion for pulmonary embolism.  Continue supplemental oxygen and current antibiotics. After his flare in April he was noted to require 2 L of oxygen at rest and up to 8 L on exertion.  -Seen by pulmonary, due to high amount of oxygen requirement, he was started on high-dose of Solu-Medrol and PCCM plans to taper starting tomorrow with p.o. prednisone.  He has not improved much even with high-dose of steroids.  Completed 7 days of antibiotics on 07/15/2021.  Still on 10 L of oxygen and appears to be dyspneic with shortness of breath.  \ -Currently on ofev, steroids.  Other as needed bronchodilators, Mucinex.     Prolonged QTc -Continue to monitor.  Avoid QTc prolonging medications.  Keep electrolytes within  normal range.  Essential hypertension - Blood pressure controlled.  Continue Cardizem 120 mg daily   Hyperlipidemia -Continue Crestor   Depression/anxiety -Klonopin 1 mg twice daily   Paroxysmal A-fib  - Rates controlled on Cardizem and Eliquis  Diarrhea: He says that he is tired mostly because of diarrhea.  I doubt C. difficile infection.  He has diarrhea even at home.  He believes it is secondary to Ofev.  He says he takes Imodium at home and he has no allergy to that.  I will prescribe Imodium.   DVT prophylaxis: Eliquis Code Status: No CPR Family Communication: Wife at bedside.  Status is: Inpatient Remains inpatient appropriate because: Maintain hospital stay on IV steroids due to significant amount of hypoxia and shortness of breath  Subjective:  Seen and examined.  Appears to be slightly dyspneic and struggling with breathing.  He blames all of this to the diarrhea.  However breathing has not improved compared to yesterday.  No other complaint.  Examination:  General exam: Appears slightly dyspneic. Respiratory system: Coarse breath sounds bilaterally with rhonchi and slight tachypnea and dyspnea. Cardiovascular system: S1 & S2 heard, RRR. No JVD, murmurs, rubs, gallops or clicks. No pedal edema. Gastrointestinal system: Abdomen is nondistended, soft and nontender. No organomegaly or masses felt. Normal bowel sounds heard. Central nervous system: Alert and oriented. No focal neurological deficits. Extremities: Symmetric 5 x 5 power. Skin: No rashes, lesions or ulcers.  Psychiatry: Judgement and insight appear normal. Mood & affect appropriate.  Objective: Vitals:   07/16/21 0100 07/16/21 0200 07/16/21 0400 07/16/21 0735  BP:   (!) 151/86 (!) 177/96  Pulse: 64 (!) 59 66 76  Resp: (!) '21 17 20 19  '$ Temp:    97.6 F (36.4 C)  TempSrc:    Oral  SpO2: 96% 98% 94% 97%  Weight:      Height:        Intake/Output Summary (Last 24 hours) at 07/16/2021 1054 Last data  filed at 07/16/2021 0913 Gross per 24 hour  Intake 537 ml  Output 1100 ml  Net -563 ml    Filed Weights   07/15/21 0041 07/15/21 0437 07/15/21 2300  Weight: 71.3 kg 69.3 kg 72.9 kg     Data Reviewed:   CBC: Recent Labs  Lab 07/11/21 0404 07/13/21 0329 07/15/21 0245  WBC 13.7* 12.3* 13.8*  HGB 11.5* 11.6* 11.6*  HCT 35.2* 34.7* 34.2*  MCV 98.9 98.0 96.9  PLT 297 280 623    Basic Metabolic Panel: Recent Labs  Lab 07/10/21 0154 07/12/21 0253 07/13/21 0329 07/15/21 0245  NA 138 138 138 140  K 4.1 5.0 4.6 4.0  CL 96* 100 101 104  CO2 33* 33* 31 32  GLUCOSE 141* 125* 136* 146*  BUN 31* '23 23 23  '$ CREATININE 1.20 0.93 0.83 0.82  CALCIUM 8.8* 8.5* 8.4* 8.0*  MG 2.0  --  2.2  --     GFR: Estimated Creatinine Clearance: 82.6 mL/min (by C-G formula based on SCr of 0.82 mg/dL). Liver Function Tests: No results for input(s): AST, ALT, ALKPHOS, BILITOT, PROT, ALBUMIN in the last 168 hours. No results for input(s): LIPASE, AMYLASE in the last 168 hours. No results for input(s): AMMONIA in the last 168 hours. Coagulation Profile: No results for input(s): INR, PROTIME in the last 168 hours. Cardiac Enzymes: No results for input(s): CKTOTAL, CKMB, CKMBINDEX, TROPONINI in the last 168 hours. BNP (last 3 results) Recent Labs    05/29/21 1045 06/25/21 1439  PROBNP 926.0* 44.0    HbA1C: No results for input(s): HGBA1C in the last 72 hours. CBG: No results for input(s): GLUCAP in the last 168 hours. Lipid Profile: No results for input(s): CHOL, HDL, LDLCALC, TRIG, CHOLHDL, LDLDIRECT in the last 72 hours. Thyroid Function Tests: No results for input(s): TSH, T4TOTAL, FREET4, T3FREE, THYROIDAB in the last 72 hours. Anemia Panel: No results for input(s): VITAMINB12, FOLATE, FERRITIN, TIBC, IRON, RETICCTPCT in the last 72 hours. Sepsis Labs: No results for input(s): PROCALCITON, LATICACIDVEN in the last 168 hours.   Recent Results (from the past 240 hour(s))  Resp  Panel by RT-PCR (Flu A&B, Covid) Nasopharyngeal Swab     Status: None   Collection Time: 07/21/2021  7:45 AM   Specimen: Nasopharyngeal Swab; Nasopharyngeal(NP) swabs in vial transport medium  Result Value Ref Range Status   SARS Coronavirus 2 by RT PCR NEGATIVE NEGATIVE Final    Comment: (NOTE) SARS-CoV-2 target nucleic acids are NOT DETECTED.  The SARS-CoV-2 RNA is generally detectable in upper respiratory specimens during the acute phase of infection. The lowest concentration of SARS-CoV-2 viral copies this assay can detect is 138 copies/mL. A negative result does not preclude SARS-Cov-2 infection and should not be used as the sole basis for treatment or other patient management decisions. A negative result may occur with  improper specimen collection/handling, submission of specimen other than nasopharyngeal swab, presence of viral mutation(s) within the areas targeted by this assay, and inadequate number of viral copies(<138 copies/mL). A negative result  must be combined with clinical observations, patient history, and epidemiological information. The expected result is Negative.  Fact Sheet for Patients:  EntrepreneurPulse.com.au  Fact Sheet for Healthcare Providers:  IncredibleEmployment.be  This test is no t yet approved or cleared by the [place] FDA and  has been authorized for detection and/or diagnosis of SARS-CoV-2 by FDA under an Emergency Use Authorization (EUA). This EUA will remain  in effect (meaning this test can be used) for the duration of the COVID-19 declaration under Section 564(b)(1) of the Act, 21 U.S.C.section 360bbb-3(b)(1), unless the authorization is terminated  or revoked sooner.       Influenza A by PCR NEGATIVE NEGATIVE Final   Influenza B by PCR NEGATIVE NEGATIVE Final    Comment: (NOTE) The Xpert Xpress SARS-CoV-2/FLU/RSV plus assay is intended as an aid in the diagnosis of influenza from Nasopharyngeal  swab specimens and should not be used as a sole basis for treatment. Nasal washings and aspirates are unacceptable for Xpert Xpress SARS-CoV-2/FLU/RSV testing.  Fact Sheet for Patients: EntrepreneurPulse.com.au  Fact Sheet for Healthcare Providers: IncredibleEmployment.be  This test is not yet approved or cleared by the [place] FDA and has been authorized for detection and/or diagnosis of SARS-CoV-2 by FDA under an Emergency Use Authorization (EUA). This EUA will remain in effect (meaning this test can be used) for the duration of the COVID-19 declaration under Section 564(b)(1) of the Act, 21 U.S.C. section 360bbb-3(b)(1), unless the authorization is terminated or revoked.  Performed at [facility], [place] [address], [place], [place] [address]   Expectorated Sputum Assessment w Gram Stain, Rflx to Resp Cult     Status: None   Collection Time: 07/15/2021 10:25 AM   Specimen: Sputum  Result Value Ref Range Status   Specimen Description SPUTUM  Final   Special Requests NONE  Final   Sputum evaluation   Final    Sputum specimen not acceptable for testing.  Please recollect.   Gram Stain Report Called to,Read Back By and Verified With: RN AMancel Bale [phone] '@[phone]'$  FH Performed at [facility], [place] [address] [place], [place] [address]    Report Status 06/25/2021 FINAL  Final  MRSA Next Gen by PCR, Nasal     Status: None   Collection Time: 06/24/2021  5:42 PM   Specimen: Nasal Mucosa; Nasal Swab  Result Value Ref Range Status   MRSA by PCR Next Gen NOT DETECTED NOT DETECTED Final    Comment: (NOTE) The GeneXpert MRSA Assay (FDA approved for NASAL specimens only), is one component of a comprehensive MRSA colonization surveillance program. It is not intended to diagnose MRSA infection nor to guide or monitor treatment for MRSA infections. Test performance is not FDA approved in patients less than 77 years old. Performed at [facility], [place] [address], [place], [place] [address]           Radiology Studies: No results found.      Scheduled Meds:  apixaban  5 mg Oral BID   clonazePAM  1 mg Oral BID   diltiazem  120 mg Oral q AM   docusate sodium  100 mg Oral BID   gabapentin  600 mg Oral BID   mouth rinse  15 mL Mouth Rinse BID   methylPREDNISolone (SOLU-MEDROL) injection  80 mg Intravenous Q8H   Nintedanib  150 mg Oral BID   pantoprazole  40 mg Oral QHS   [START ON 07/17/2021] predniSONE  40 mg Oral  Q breakfast   psyllium  1 packet Oral QHS   rosuvastatin  20 mg Oral Daily   sodium chloride flush  3 mL Intravenous Q12H   Continuous Infusions:  sodium chloride Stopped (07/14/21 0610)     LOS: 8 days    Darliss Cheney, MD [facility]  If 7PM-7AM, please contact night-coverage  07/16/2021, 10:54 AM

## 2021-07-16 NOTE — TOC Progression Note (Signed)
Transition of Care [facility]) - Progression Note    Patient Details  Name: Eduardo Fuentes MRN: [id number] Date of Birth: January 12, 1951  Transition of [facility]) CM/SW Contact  Zenon Mayo, RN Phone Number: 07/16/2021, 4:32 PM  Clinical Narrative:    He is set up with [facility] for HHPT, [place].  [employer] will supply shower stool ( patient will pay for) and rollator prior to dc.  Wife to transport him home at dc.    Expected Discharge Plan: [place] Barriers to Discharge: Continued Medical Work up  Expected Discharge Plan and Services Expected Discharge Plan: [place]   Discharge Planning Services: CM Consult Post Acute Care Choice: Durable Medical Equipment, Home Health Living arrangements for the past 2 months: Single Family Home                 DME Arranged: Walker rolling with seat, Shower stool DME Agency: [employer] Date DME Agency Contacted: 07/11/21 Time DME Agency Contacted: [id number] Representative spoke with at DME Agency: Jodell Cipro HH Arranged: PT, OT [place] Agency: [place] ([place]) Date [place]: 07/11/21 Time [place]: 1154 Representative spoke with at [place]: [place] ([place]) Interventions    Readmission Risk Interventions    07/11/2021   10:40 AM  Readmission Risk Prevention Plan  Transportation Screening Complete  PCP or Specialist Appt within 3-5 Days Complete  HRI or [place] Complete  Social Work Consult for [place] Planning/Counseling Complete  Palliative Care Screening Complete  Medication Review Press photographer) Complete

## 2021-07-16 NOTE — Progress Notes (Addendum)
NAME:  Eduardo Fuentes, MRN:  [id number], DOB:  09-25-1950, LOS: 8 ADMISSION DATE:  07/19/2021, CONSULTATION DATE:  07/19/2021 REFERRING MD:  Masters, CHIEF COMPLAINT:  Hypoxemia, IPF, Pneumonia, Heart Failure   History of Present Illness:  71 year old male, never smoker with history of IPF on Ofev as an outpatient with recent admission last month for IPF exacerbation, elevated troponins.  He had a cardiac cath during that admission which showed nonobstructive coronary artery disease. Now readmitted with worsening dyspnea, hypoxia to the 50s.  Chest x-ray showed new right lung infiltrates.  After his flare in April he was noted to require 2 L of oxygen at rest and up to 8 L on exertion   Pertinent  Medical History   PMH significant for coronary artery calcification, afib, IPF, prostate CA, hyperlipidemia, GERD with sliding hiatal hernia, GAD, panic disorder with agoraphobia.   Past Medical History:  Diagnosis Date   Agatston CAC score, <100 04/2018   Arthritis    NECK   Cough    NON-PRODUCTIVE   Depressive disorder 10/2011   follow by dr [facility] psychiatry; has anxiety and panic disorder as well.   Diverticulosis    Diverticulosis of colon    Followed by Dr. Henrene Pastor   Essential hypertension    Generalized anxiety disorder    GERD (gastroesophageal reflux disease)    WATCHES DIET   Hemorrhoid    Hiatal hernia    Describes a sliding hiatal hernia   History of colon polyps    History of kidney stones    Idiopathic pulmonary fibrosis ([place])    Microscopic hematuria    Nocturia    Panic attacks    Prostate cancer ([place]) 07/2012   Dr. Arnell Sieving urology.   Sinus infection    Tinnitus    LEFT EAR -- CHRONIC     Significant Hospital Events: Including procedures, antibiotic start and stop dates in addition to other pertinent events   Admitted 05/29/2021 for IPF/ CHF flare Admission 06/27/2021 with Pneumonia, CHF  Vanc and Cefepime initiated 07/12/2021   Cardiac Cath  05/30/2021 The left ventricular systolic function is normal.   LV end diastolic pressure is normal.   The left ventricular ejection fraction is 50-55% by visual estimate.   Hemodynamic findings consistent with mild pulmonary hypertension.   There is no aortic valve stenosis.   Mild, diffuse nonobstructive coronary artery disease.   Aortic saturation 94%, PA saturation 65%, PA pressure 55/21, mean PA pressure 34 mmHg, pulmonary capillary wedge pressure 9 mmHg, cardiac output 4.75 L/min, cardiac index 2.5.  Elevated troponin likely from demand ischemia.  Nonobstructive, minimal CAD.  Continue medical therapy.      Interim History / Subjective:  Not feeling great this morning. Mostly associates this with diarrhea since Saturday. Breathing worse too though. He does not note much improvement since the high dose steroids started a few days ago. On 10 L [place] high flow with sats in the mid to high 80s. Cannot speak in full sentences due to dyspnea.   Objective   Blood pressure (!) 177/96, pulse 76, temperature 97.6 F (36.4 C), temperature source Oral, resp. rate 19, height '5\' 9"'$  (1.753 m), weight 72.9 kg, SpO2 97 %.        Intake/Output Summary (Last 24 hours) at 07/16/2021 0856 Last data filed at 07/16/2021 0305 Gross per 24 hour  Intake 357 ml  Output 1100 ml  Net -743 ml    Filed Weights   07/15/21 0041 07/15/21 0437  07/15/21 2300  Weight: 71.3 kg 69.3 kg 72.9 kg    Examination: General:   elderly male in bed in mild respiratory distress.  HEENT: [place]/AT, PERRL, no JVD Neuro: Alert, oriented, non-focal.  CV: RRR, no MRG PULM: Mildly dyspneic on 10L salter. Cannot speak full sentences. Sats in the 85-90 range. Fine crackles.  GI: soft, NT, ND Extremities: No acute deformity or edema.  Skin: grossly intact.    CXR 5/19 personally reviewed> entire R lung, LLL opacified, chronic fibrotic changes most prominent in bases.  Resolved Hospital Problem list     Assessment & Plan:   Acute on chronic hypoxemic respiratory failure- with background of IPF, worsening R sided haziness.  Viral studies neg, MRSA PCR neg.  Aspiration vs. AE-IPF vs. Pulmonary edema vs. CAP vs. Some combination.  Unfortunately his persistent hypoxia, coarse, low procalcitonin all points to an IPF exacerbation P: -continue supplemental O2 to maintain SpO2 >88% -currently 10 L and will need more with exertion -Need to wean steroids at some point. He has not had a meaningful improvement despite 240 mg Solumedrol daily. Will plan to transition to prednisone 40 mg starting tomorrow.  -7 days antibiotics completed 5/22 -Not volume overloaded on exam.  -He has been explained that this is likely an acute flare of his IPF, which carries a poor prognosis previously. If he does not make improvements, this is likely chronic progression of fibrosis. Ongoing pallaitive care discussions are appropriate. If his respiratory status worsens, I would focus on comfort as the prognosis is very grim. -mobility, maintain adequate nutrition  Diarrhea: since saturday. Multiple liquid bowel movements.  - Requesting imodium. TRH note lists an allergy to imodium. Defer to primary.    [name], AGACNP-BC [facility]  See Amion for personal pager PCCM on call pager [phone] until 7pm. Please call Elink 7p-7a. [phone]  07/16/2021 9:04 AM

## 2021-07-17 ENCOUNTER — Inpatient Hospital Stay (HOSPITAL_COMMUNITY): Payer: PPO

## 2021-07-17 DIAGNOSIS — J84112 Idiopathic pulmonary fibrosis: Secondary | ICD-10-CM | POA: Diagnosis not present

## 2021-07-17 DIAGNOSIS — Z66 Do not resuscitate: Secondary | ICD-10-CM

## 2021-07-17 DIAGNOSIS — Z515 Encounter for palliative care: Secondary | ICD-10-CM | POA: Diagnosis not present

## 2021-07-17 DIAGNOSIS — F4001 Agoraphobia with panic disorder: Secondary | ICD-10-CM | POA: Diagnosis not present

## 2021-07-17 DIAGNOSIS — J9611 Chronic respiratory failure with hypoxia: Secondary | ICD-10-CM | POA: Diagnosis not present

## 2021-07-17 LAB — CBC WITH DIFFERENTIAL/PLATELET
Abs Immature Granulocytes: 0.14 10*3/uL — ABNORMAL HIGH (ref 0.00–0.07)
Basophils Absolute: 0 10*3/uL (ref 0.0–0.1)
Basophils Relative: 0 %
Eosinophils Absolute: 0 10*3/uL (ref 0.0–0.5)
Eosinophils Relative: 0 %
HCT: 41.1 % (ref 39.0–52.0)
Hemoglobin: 13.6 g/dL (ref 13.0–17.0)
Immature Granulocytes: 1 %
Lymphocytes Relative: 2 %
Lymphs Abs: 0.3 10*3/uL — ABNORMAL LOW (ref 0.7–4.0)
MCH: 32.5 pg (ref 26.0–34.0)
MCHC: 33.1 g/dL (ref 30.0–36.0)
MCV: 98.1 fL (ref 80.0–100.0)
Monocytes Absolute: 0.8 10*3/uL (ref 0.1–1.0)
Monocytes Relative: 4 %
Neutro Abs: 16 10*3/uL — ABNORMAL HIGH (ref 1.7–7.7)
Neutrophils Relative %: 93 %
Platelets: 297 10*3/uL (ref 150–400)
RBC: 4.19 MIL/uL — ABNORMAL LOW (ref 4.22–5.81)
RDW: 15 % (ref 11.5–15.5)
WBC: 17.2 10*3/uL — ABNORMAL HIGH (ref 4.0–10.5)
nRBC: 0 % (ref 0.0–0.2)

## 2021-07-17 LAB — BLOOD GAS, ARTERIAL
Acid-Base Excess: 9.6 mmol/L — ABNORMAL HIGH (ref 0.0–2.0)
Bicarbonate: 35.8 mmol/L — ABNORMAL HIGH (ref 20.0–28.0)
Drawn by: 270271
O2 Saturation: 97.7 %
Patient temperature: 37
pCO2 arterial: 54 mmHg — ABNORMAL HIGH (ref 32–48)
pH, Arterial: 7.43 (ref 7.35–7.45)
pO2, Arterial: 86 mmHg (ref 83–108)

## 2021-07-17 LAB — BASIC METABOLIC PANEL
Anion gap: 5 (ref 5–15)
BUN: 27 mg/dL — ABNORMAL HIGH (ref 8–23)
CO2: 34 mmol/L — ABNORMAL HIGH (ref 22–32)
Calcium: 8.5 mg/dL — ABNORMAL LOW (ref 8.9–10.3)
Chloride: 101 mmol/L (ref 98–111)
Creatinine, Ser: 0.77 mg/dL (ref 0.61–1.24)
GFR, Estimated: >60 mL/min (ref >60)
Glucose, Bld: 101 mg/dL — ABNORMAL HIGH (ref 70–99)
Potassium: 4.1 mmol/L (ref 3.5–5.1)
Sodium: 140 mmol/L (ref 135–145)

## 2021-07-17 LAB — MAGNESIUM: Magnesium: 2.4 mg/dL (ref 1.7–2.4)

## 2021-07-17 MED ORDER — AMLODIPINE BESYLATE 5 MG PO TABS
5.0000 mg | ORAL_TABLET | Freq: Every day | ORAL | Status: DC
  Administered 2021-07-17 – 2021-07-24 (×8): 5 mg via ORAL
  Filled 2021-07-17 (×8): qty 1

## 2021-07-17 NOTE — Progress Notes (Signed)
Pt's urine output amber/orange. Pt given education and encouraged to drink water.

## 2021-07-17 NOTE — Progress Notes (Signed)
Pt resting in bed without distress. Pt alert and oriented. BP 160/101; HR 78; oxygen saturation 95% on heated HFNC (temp 91.4; 40L; 80 FiO2.) Pt refusing wife being contacted to notify of fall. Pt denies any pain; pain level 0/10. Call bell within reach. Pt denies any further questions or concerns.

## 2021-07-17 NOTE — Progress Notes (Deleted)
Pt's urine amber/orange. Pt encouraged to drink water.

## 2021-07-17 NOTE — Progress Notes (Signed)
Called to patients room stat.  Patient had a fall and after getting back in chair his oxygen dropped with a NRB mask and high flow salter it wasn't increases as it should.  Patient then placed on Heated High Flow nasal cannula 40 liters and 80% SP02 now 94%.  Arterial blood gas obtained and sent to lab for results.  RT will titrate oxygen as tolerated after ABG results are verified.

## 2021-07-17 NOTE — Progress Notes (Signed)
Occupational Therapy Treatment Patient Details Name: LOGON UTTECH MRN: [id number] DOB: 1950-08-06 Today's Date: 07/17/2021   History of present illness Pt is a 71 y.o. male who presented 07/15/2021 with SOB and hypoxia to the 50s. Pt admitted with acute on chronic hypoxemia respiratory failure ;ikely secondary to PNA vs IFP exacerbation. PMH: coronary artery calcification, afib, IPF, prostate CA, hyperlipidemia, GERD with sliding hiatal hernia, GAD, panic disorder with agoraphobia   OT comments  Pt agreeable to EOB activities this session. He had a fall earlier this morning and reports that he has been stressed and worried ever since, requiring him to need increased spO2 support. He continues to be quick to fatigue and requires multiple 2+ min rest breaks in between tasks. Continuing to recommend HHOT at this time and OT will continue to follow.    Recommendations for follow up therapy are one component of a multi-disciplinary discharge planning process, led by the attending physician.  Recommendations may be updated based on patient status, additional functional criteria and insurance authorization.    Follow Up Recommendations  Home health OT    Assistance Recommended at Discharge Frequent or constant Supervision/Assistance  Patient can return home with the following  A little help with walking and/or transfers;A little help with bathing/dressing/bathroom;Assistance with cooking/housework;Assist for transportation;Help with stairs or ramp for entrance   Equipment Recommendations  None recommended by OT    Recommendations for Other Services      Precautions / Restrictions Precautions Precautions: Fall;Other (comment) Precaution Comments: watch SpO2, 35L heated HFNC FiO270% Restrictions Weight Bearing Restrictions: No       Mobility Bed Mobility Overal bed mobility: Needs Assistance Bed Mobility: Supine to Sit, Sit to Supine     Supine to sit: Min guard, HOB elevated Sit to  supine: Min guard   General bed mobility comments: minG to complete, cues for use of UE on bed rails to complete transition to sitting EOB, able to return to flat bed without assist    Transfers                   General transfer comment: declined OOB transfer, able to complete multiple lateral scoots along EOB with assist only to adjust bed pads     Balance Overall balance assessment: Needs assistance Sitting-balance support: No upper extremity supported, Feet supported Sitting balance-Leahy Scale: Good Sitting balance - Comments: able to lean outside BOS and return without assist                                   ADL either performed or assessed with clinical judgement   ADL Overall ADL's : Needs assistance/impaired                                       General ADL Comments: Session focused on controlled breathing, energy conservation, and EOB exercises    Extremity/Trunk Assessment              Vision       Perception     Praxis      Cognition Arousal/Alertness: Awake/alert Behavior During Therapy: WFL for tasks assessed/performed Overall Cognitive Status: Within Functional Limits for tasks assessed  General Comments: pt motivated with encouragement, able to follow all cues        Exercises Exercises: Other exercises Other Exercises Other Exercises: seated forward reach, initially with UE support, but able to continue after support removed. x5 reaching outside BOS Other Exercises: seated scapular retractions against min manual resistance x5 Other Exercises: Pursed lip breathing during rest breaks    Shoulder Instructions       General Comments spO2 noted down to 75% at end of session with HHF on 35L at 70% FiO2, took 5+ mins to recover to upper 80%    Pertinent Vitals/ Pain       Pain Assessment Pain Assessment: No/denies pain Pain Intervention(s): Monitored during  session  Home Living                                          Prior Functioning/Environment              Frequency  Min 2X/week        Progress Toward Goals  OT Goals(current goals can now be found in the care plan section)  Progress towards OT goals: Progressing toward goals  Acute Rehab OT Goals Patient Stated Goal: To get O2 turned down OT Goal Formulation: With patient/family Time For Goal Achievement: 07/26/21 Potential to Achieve Goals: Good ADL Goals Pt Will Perform Lower Body Bathing: Independently;sit to/from stand;sitting/lateral leans Pt Will Perform Lower Body Dressing: Independently;sitting/lateral leans;sit to/from stand Pt Will Transfer to Toilet: Independently;ambulating;regular height toilet Pt/caregiver will Perform Home Exercise Program: Increased strength;Both right and left upper extremity;With written HEP provided;With Supervision;With theraband Additional ADL Goal #1: Patient will demonstrate increased activity tolerance and appropriate O2 levels in order to complete functional tasks in standing for 3-5 minutes without need for seated rest break. Additional ADL Goal #2: Patient will be able to verbalize 3 strategies for energy conservation for safe discharge home.  Plan Discharge plan remains appropriate    Co-evaluation    PT/OT/SLP Co-Evaluation/Treatment: Yes Reason for Co-Treatment: Other (comment) (limited activity tolerance) PT goals addressed during session: Mobility/safety with mobility;Strengthening/ROM OT goals addressed during session: Strengthening/ROM      AM-PAC OT "6 Clicks" Daily Activity     Outcome Measure   Help from another person eating meals?: A Little Help from another person taking care of personal grooming?: A Little Help from another person toileting, which includes using toliet, bedpan, or urinal?: A Lot Help from another person bathing (including washing, rinsing, drying)?: A Little Help from  another person to put on and taking off regular upper body clothing?: A Little Help from another person to put on and taking off regular lower body clothing?: A Little 6 Click Score: 17    End of Session Equipment Utilized During Treatment: Oxygen  OT Visit Diagnosis: Unsteadiness on feet (R26.81);Other abnormalities of gait and mobility (R26.89);Muscle weakness (generalized) (M62.81)   Activity Tolerance Patient tolerated treatment well   Patient Left in bed;with call bell/phone within reach;with family/visitor present   Nurse Communication Mobility status        Time: 3154-0086 OT Time Calculation (min): 33 min  Charges: OT General Charges $OT Visit: 1 Visit OT Treatments $Therapeutic Exercise: 8-22 mins  Tirza Senteno H., OTR/L Acute Rehabilitation  Jeniyah Menor Elane Wade Sigala 07/17/2021, 11:51 AM

## 2021-07-17 NOTE — Progress Notes (Addendum)
NAME:  Eduardo Fuentes, MRN:  [id number], DOB:  1951/02/14, LOS: 9 ADMISSION DATE:  06/29/2021, CONSULTATION DATE:  07/17/2021 REFERRING MD:  Masters, CHIEF COMPLAINT:  Hypoxemia, IPF, Pneumonia, Heart Failure   History of Present Illness:  71 year old male, never smoker with history of IPF on Ofev as an outpatient with recent admission last month for IPF exacerbation, elevated troponins.  He had a cardiac cath during that admission which showed nonobstructive coronary artery disease. Now readmitted with worsening dyspnea, hypoxia to the 50s.  Chest x-ray showed new right lung infiltrates.  After his flare in April he was noted to require 2 L of oxygen at rest and up to 8 L on exertion   Pertinent  Medical History   PMH significant for coronary artery calcification, afib, IPF, prostate CA, hyperlipidemia, GERD with sliding hiatal hernia, GAD, panic disorder with agoraphobia.  Significant Hospital Events: Including procedures, antibiotic start and stop dates in addition to other pertinent events   Admitted 05/29/2021 for IPF/ CHF flare Admission 07/02/2021 with Pneumonia, CHF  Vanc and Cefepime initiated 07/15/2021   Cardiac Cath 05/30/2021 The left ventricular systolic function is normal.   LV end diastolic pressure is normal.   The left ventricular ejection fraction is 50-55% by visual estimate.   Hemodynamic findings consistent with mild pulmonary hypertension.   There is no aortic valve stenosis.   Mild, diffuse nonobstructive coronary artery disease.   Aortic saturation 94%, PA saturation 65%, PA pressure 55/21, mean PA pressure 34 mmHg, pulmonary capillary wedge pressure 9 mmHg, cardiac output 4.75 L/min, cardiac index 2.5.  Elevated troponin likely from demand ischemia.  Nonobstructive, minimal CAD.  Continue medical therapy.      Interim History / Subjective:  Confused and fell overnight. No injury reported by patient or staff. Was hypoxic surrounding the events and was escalated  from 10L [place] to HFNC 80% and 40LPM. He does not remember the events very well but seems to be mentally intact this morning.   Objective   Blood pressure (!) 160/101, pulse 67, temperature 98.4 F (36.9 C), temperature source Oral, resp. rate 16, height '5\' 9"'$  (1.753 m), weight 73.1 kg, SpO2 97 %.    FiO2 (%):  [70 %-80 %] 70 %   Intake/Output Summary (Last 24 hours) at 07/17/2021 0825 Last data filed at 07/17/2021 0824 Gross per 24 hour  Intake 897 ml  Output 1150 ml  Net -253 ml    Filed Weights   07/15/21 0437 07/15/21 2300 07/17/21 0519  Weight: 69.3 kg 72.9 kg 73.1 kg    Examination: General:  frail elderly male in mild respiratory distress HEENT: /AT, PERRL, no JVD Neuro: alert oriented, non-focal CV: RRR, no MRG PULM: Mildly dyspneic on 10L [place]. Cannot speak full sentences. Sats in the 85-90 range. Fine crackles.  GI: soft, NT, ND Extremities: No acute deformity or edema.  Skin: grossly intact.    CXR 5/19 personally reviewed> entire R lung, LLL opacified, chronic fibrotic changes most prominent in bases.  Resolved Hospital Problem list     Assessment & Plan:  Acute on chronic hypoxemic respiratory failure- with background of IPF, worsening R sided haziness.  Viral studies neg, MRSA PCR neg.  Aspiration vs. AE-IPF vs. Pulmonary edema vs. CAP vs. Some combination.  Unfortunately his persistent hypoxia, coarse, low procalcitonin all points to an IPF exacerbation -7 days antibiotics completed 5/22 -Not volume overloaded on exam.  P: -continue supplemental O2 to maintain SpO2 >88% -Need to get him weaned  off of HFNC. I suspect he will be able to wean down.  - May have to tolerate periods of desaturation into the low 80s or even the high 70s. He will desaturate easily with activity and take a while to recover, which is typical of end stage IPF. -He has not had a meaningful improvement despite 240 mg Solumedrol daily for 5 days. Pred 40 starting to day with plans to wean  by '10mg'$  every 10 days.   Goals of care: Long discussion yesterday between the patient, myself, and Dr. [name]. The patient understands prognosis here is quite poor and that all therapeutic interventions have been tried and have seemingly failed. The concepts of hospice care were introduced and as of this morning the patient is thinking. He has been clear he does not want me to contact his wife or sister with updates on his care.   It looks like palliative care was seeing him previously. Probably worth having PMT come back at this point for further support.   [name], AGACNP-BC [facility]  See Amion for personal pager PCCM on call pager [phone] until 7pm. Please call Elink 7p-7a. [phone]  07/17/2021 8:25 AM   PCCM:  52 ILD, advanced disease, likely end-stage. Remains on high o2 requirements   BP (!) 174/103   Pulse 67   Temp 98.4 F (36.9 C) (Oral)   Resp 16   Ht '5\' 9"'$  (1.753 m)   Wt 73.1 kg   SpO2 97%   BMI 23.80 kg/m   GEN: elderly male, no distress  HENT: Tracking appropriately  Heart: RRR s1 s2  Lungs: crackles BL   Labs reviewed   A:  AoC hypoxemic respiratory failure  IPF, ILD exacerbation   P: Weaning fio2  Not much else to offer from a pulmonary standpoint  Supportive care  Agreed with palliative care  I hope he is open to home hospice at some point  Hopefully we can get his o2 requirements down enough so he can go home.   [name], DO [facility] 07/17/2021 10:17 AM

## 2021-07-17 NOTE — Plan of Care (Signed)
  Problem: Activity: Goal: Ability to tolerate increased activity will improve Outcome: Progressing   

## 2021-07-17 NOTE — Plan of Care (Signed)
  Problem: Activity: Goal: Ability to tolerate increased activity will improve Outcome: Progressing   Problem: Respiratory: Goal: Ability to maintain a clear airway will improve Outcome: Progressing   

## 2021-07-17 NOTE — Progress Notes (Signed)
Patient started to desat (85-86%) on 20L O2 via [employer], had to increase to 25L, then 30L w/60 FiO2. Sats WNl set for patient now.

## 2021-07-17 NOTE — Progress Notes (Signed)
Pt found on floor post fall; pt fell from bed while bed alarming. Pt denies hitting head. Pt reoriented; per pt woke up confused. Pt found with HFNC detached. Pt placed on nonrebreather. Oxygen saturation 74% on room air. BP 139/88, HR 102, Temp 98.4 F. Respiratory called; MD aware. Pt refused wife being notified about fall due to time; per pt will call her later today. Skin accessed, no significant changes from baseline. Pt found with external catheter removed and feces on bed pad. Pt transitioned to chair. Respiratory arrived to bedside. Oxygen saturation 98% on heated HFNC. Call bell within reach. Pt denies any further questions or concerns at this time.

## 2021-07-17 NOTE — Progress Notes (Signed)
Palliative Medicine Progress Note   Patient Name: Eduardo Fuentes       Date: 07/17/2021 DOB: 1951/01/14  Age: 71 y.o. MRN#: [id number] Attending Physician: Darliss Cheney, MD Primary Care Physician: Lujean Amel, MD Admit Date: 07/12/2021  Reason for Consultation/Follow-up: {Reason for Consult:23484}  HPI/Patient Profile: 71 y.o. male  with past medical history of IPF, atrial fibrillation, prostate cancer, coronary artery calcification, sliding hiatal hernia, generalized anxiety with panic disorder and agoraphobia, GERD, and hyperlipidemia.  He presented to the emergency department on 07/18/2021 with 2 days of worsening dyspnea.  He had been seen in the pulmonary office 5/12 and recommended to increase O2 to 8L on exertion.  Later that evening he had acute onset of worsening dyspnea which became progressively worse over the next 2 days. He had not increased his oxygen to 8 L as recommended due to the lack of the concentrator in his home. On arrival to the ED, his O2 sats were in the 103s.  He quickly improved to the upper 90s with nonrebreather.  Chest x-ray showed increased opacification in the right lung suspicious for pneumonia.  Labs significant for WBC 14.9 and BNP 1048.  Troponin elevated at 149, but is suspected to be demand ischemia.  He had been advised to stop Lasix as he did not look volume overloaded, and most recent BNP was 44. He is admitted to [facility] with pneumonia, acute on chronic hypoxic respiratory failure, and acute on chronic CHF.  Subjective: Chart reviewed.  Note that patient had a fall overnight.  I visited patient at bedside.  He is on HFNC at 30 L/min.  Patient's pastor is visiting - Eduardo Fuentes gives permission to discuss goals of care in front of him.  Conversation had on the  seriousness of Eduardo Fuentes's current medical situation. I expressed concern that his respiratory status has worsened.  I expressed concerned that his time on this earth may be more limited than we had initially hoped.  Plan of care was discussed with Dr. Doristine Bosworth, bedside RN, and TOC  Objective:  Physical Exam Vitals reviewed.  Constitutional:      Appearance: He is ill-appearing.  Pulmonary:     Effort: Pulmonary effort is normal.  Neurological:     Mental Status: He is alert and oriented to person, place, and time.  Psychiatric:  Mood and Affect: Affect is tearful.            Vital Signs: BP (!) 149/90   Pulse 82   Temp 97.7 F (36.5 C) (Oral)   Resp (!) 23   Ht '5\' 9"'$  (1.753 m)   Wt 73.1 kg   SpO2 (!) 89%   BMI 23.80 kg/m  SpO2: SpO2: (!) 89 % O2 Device: O2 Device: High Flow Nasal Cannula O2 Flow Rate: O2 Flow Rate (L/min): (S) 30 L/min (Decreased to 30L. Patient is tolerating it well at this time.)     LBM: Last BM Date : 07/17/21     Palliative Assessment/Data: ***     Palliative Medicine Assessment & Plan   Assessment: Principal Problem:   Chronic respiratory failure with hypoxia (HCC) Active Problems:   Major depressive disorder, recurrent episode (HCC)   Panic disorder with agoraphobia   IPF (idiopathic pulmonary fibrosis) (HCC)   Paroxysmal atrial fibrillation (HCC) CHA2VASC2 = 3; on Eliiquis   Prolonged QT interval   Community acquired pneumonia    Recommendations/Plan: CODE STATUS changed to DNR/DNI Continue supportive care for now Goal is for home with hospice when medically optimized Referral to [facility] -TOC order placed   Prognosis:  < 3 months    Thank you for allowing the Palliative Medicine Team to assist in the care of this patient.   ***   Lavena Bullion, NP   Please contact Palliative Medicine Team phone at [phone] for questions and concerns.  For individual providers, please see AMION.

## 2021-07-17 NOTE — Progress Notes (Signed)
Physical Therapy Treatment Patient Details Name: Eduardo Fuentes MRN: [id number] DOB: [birth year] Today's Date: 07/17/2021   History of Present Illness Pt is a 71 y.o. male who presented 07/14/2021 with SOB and hypoxia to the 50s. Pt admitted with acute on chronic hypoxemia respiratory failure ;ikely secondary to PNA vs IFP exacerbation. PMH: coronary artery calcification, afib, IPF, prostate CA, hyperlipidemia, GERD with sliding hiatal hernia, GAD, panic disorder with agoraphobia    PT Comments    The pt was agreeable to session with encouragement, reports still slightly fatigued from sitting up in the recliner from 4:30-6:00 this morning after his fall. The pt was received on 35L heated HFNC with FiO2 70% with SpO2 96-99% at rest and low of 75% with lateral scooting along the edge of the bed. The pt was able to complete seated balance exercises leaning outside of BOS and for posterior core and postural muscles with tactile cues and prolonged rest to recover between each set of exercises. Continues to endorse poor activity tolerance, strength, and endurance.     Recommendations for follow up therapy are one component of a multi-disciplinary discharge planning process, led by the attending physician.  Recommendations may be updated based on patient status, additional functional criteria and insurance authorization.  Follow Up Recommendations  Home health PT     Assistance Recommended at Discharge Intermittent Supervision/Assistance  Patient can return home with the following A little help with walking and/or transfers;Help with stairs or ramp for entrance;Assist for transportation;Assistance with cooking/housework;A little help with bathing/dressing/bathroom   Equipment Recommendations  Rollator (4 wheels);Other (comment) (tub bench)    Recommendations for Other Services       Precautions / Restrictions Precautions Precautions: Fall;Other (comment) Precaution Comments: watch SpO2, 35L  heated HFNC FiO270% Restrictions Weight Bearing Restrictions: No     Mobility  Bed Mobility Overal bed mobility: Needs Assistance Bed Mobility: Supine to Sit, Sit to Supine     Supine to sit: Min guard, HOB elevated Sit to supine: Min guard   General bed mobility comments: minG to complete, cues for use of UE on bed rails to complete transition to sitting EOB, able to return to flat bed without assist    Transfers                   General transfer comment: declined OOB transfer, able to complete multiple lateral scoots along EOB with assist only to adjust bed pads    Ambulation/Gait               General Gait Details: declined OOB due to fatigue      Balance Overall balance assessment: Needs assistance Sitting-balance support: No upper extremity supported, Feet supported Sitting balance-Leahy Scale: Good Sitting balance - Comments: able to lean outside BOS and return without assist                                    Cognition Arousal/Alertness: Awake/alert Behavior During Therapy: WFL for tasks assessed/performed Overall Cognitive Status: Within Functional Limits for tasks assessed                                 General Comments: pt motivated with encouragement, able to follow all cues        Exercises Other Exercises Other Exercises: seated forward reach, initially with UE support, but able to continue  after support removed. x5 reaching outside BOS Other Exercises: seated scapular retractions against min manual resistance x5    General Comments General comments (skin integrity, edema, etc.): SpO2 to low of 76% on 35L heated HFNC with FiO2 of 70% with lateral scooting and return to supine. low of 86% with seated exercises. takes 3-5 min to recover to 88-90s with seated rest and cues for PLB      Pertinent Vitals/Pain Pain Assessment Pain Assessment: No/denies pain Pain Intervention(s): Monitored during session      PT Goals (current goals can now be found in the care plan section) Acute Rehab PT Goals Patient Stated Goal: to improve his pulmonary function PT Goal Formulation: With patient Time For Goal Achievement: 07/31/21 Potential to Achieve Goals: Good    Frequency    Min 3X/week      PT Plan Current plan remains appropriate    Co-evaluation PT/OT/SLP Co-Evaluation/Treatment: Yes Reason for Co-Treatment:  (limited activity tolerance) PT goals addressed during session: Mobility/safety with mobility;Strengthening/ROM        AM-PAC PT "6 Clicks" Mobility   Outcome Measure  Help needed turning from your back to your side while in a flat bed without using bedrails?: A Little Help needed moving from lying on your back to sitting on the side of a flat bed without using bedrails?: A Little Help needed moving to and from a bed to a chair (including a wheelchair)?: A Little Help needed standing up from a chair using your arms (e.g., wheelchair or bedside chair)?: A Little Help needed to walk in hospital room?: A Little Help needed climbing 3-5 steps with a railing? : A Little 6 Click Score: 18    End of Session Equipment Utilized During Treatment: Oxygen Activity Tolerance: Other (comment) (limited by poor endurance and SpO2 drops) Patient left: in bed;with call bell/phone within reach;with bed alarm set;with family/visitor present Nurse Communication: Mobility status PT Visit Diagnosis: Unsteadiness on feet (R26.81);Other abnormalities of gait and mobility (R26.89);Muscle weakness (generalized) (M62.81);Difficulty in walking, not elsewhere classified (R26.2)     Time: 3704-8889 PT Time Calculation (min) (ACUTE ONLY): 33 min  Charges:  $Therapeutic Exercise: 8-22 mins                     [name], PT, DPT   Acute Rehabilitation Department Pager #: [phone]   [name] 07/17/2021, 11:24 AM

## 2021-07-17 NOTE — Progress Notes (Signed)
Sedated PROGRESS NOTE    Eduardo Fuentes  DPO:[id number] DOB: 1950-12-05 DOA: 07/11/2021 PCP: Lujean Amel, MD   Brief Narrative:  71 year old male with HTN, pulmonary fibrosis on 8 L with activities at home, prostate cancer, comes into the hospital with shortness of breath.  He has been having IPF for a number of years, but over the last several months he became worse, and started on oxygen about 3 months ago.  He has been on 2 to 3 L, but when seen by pulmonology last Friday he was decreased to 8 L with exertion.  He continued to progress getting worse at home and eventually came to the hospital.  Sats were at 60% despite nasal cannula.  Pulmonary team and palliative care has been consulted.   Assessment & Plan:  Principal Problem:   Chronic respiratory failure with hypoxia (HCC) Active Problems:   Major depressive disorder, recurrent episode (HCC)   Panic disorder with agoraphobia   IPF (idiopathic pulmonary fibrosis) (HCC)   Paroxysmal atrial fibrillation (HCC) CHA2VASC2 = 3; on Eliiquis   Prolonged QT interval   Community acquired pneumonia     Acute on chronic respiratory failure with hypoxia Pulmonary fibrosis flare -Likely secondary to progression of IPF with superimposed pneumonia.  There is low suspicion for pulmonary embolism.   After his flare in April he was noted to require 2 L of oxygen at rest and up to 8 L on exertion.  -Seen by pulmonary, due to high amount of oxygen requirement, he was started on high-dose of Solu-Medrol which he received for 4 to 5 days and has been transitioned to oral prednisone on 07/17/2021 with the plan to taper 10 mg every 10 days per PCCM.  Completed 7 days of antibiotics on 07/15/2021.  Per reports, patient fell out of the bed overnight, no injury reported but he was hypoxic surrounding those events and he was escalated from 10 L nasal clear to 80% high flow nasal cannula and 40 L/min.  He is fully alert and oriented at this point in time.  Per  PCCM "May have to tolerate periods of desaturation into the low 80s or even the high 70s. He will desaturate easily with activity and take a while to recover, which is typical of end stage IPF".  They also had a long discussion with the patient and patient understands the prognosis here is quite poor and that all therapeutic interventions have been tried and have seemingly failed.  They also introduced the concept of hospice with the patient and patient is thinking currently and does not want medical staff to talk to his wife or his daughter.  I have requested palliative care to see him and discuss with him again. -Currently on ofev, steroids.  Other as needed bronchodilators, Mucinex.     Prolonged QTc -Continue to monitor.  Avoid QTc prolonging medications.  Keep electrolytes within normal range.  Essential hypertension - Blood pressure times but remains very labile, continue Cardizem 120 mg daily, add amlodipine 5 mg p.o. daily today.   Hyperlipidemia -Continue Crestor   Depression/anxiety -Klonopin 1 mg twice daily   Paroxysmal A-fib  - Rates controlled on Cardizem and Eliquis  Diarrhea: Improving on Imodium.   DVT prophylaxis: Eliquis Code Status: No CPR Family Communication: Wife at bedside.  Status is: Inpatient Remains inpatient appropriate because: Maintain hospital stay on IV steroids due to significant amount of hypoxia and shortness of breath  Subjective:  Patient seen and examined.  No new complaint.  Diarrhea  improving.  He reports falling off the bed last night.  Fully alert and oriented.  No injury reported.  Examination:  General exam: Appears dyspneic Respiratory system: Coarse breath sounds with rhonchi bilaterally. Respiratory effort normal. Cardiovascular system: S1 & S2 heard, RRR. No JVD, murmurs, rubs, gallops or clicks. No pedal edema. Gastrointestinal system: Abdomen is nondistended, soft and nontender. No organomegaly or masses felt. Normal bowel sounds  heard. Central nervous system: Alert and oriented. No focal neurological deficits. Extremities: Symmetric 5 x 5 power. Skin: No rashes, lesions or ulcers.  Psychiatry: Judgement and insight appear normal. Mood & affect appropriate.   Objective: Vitals:   07/17/21 0644 07/17/21 0800 07/17/21 0805 07/17/21 1108  BP: (!) 160/101 (!) 174/103    Pulse: 72 76 67 75  Resp: '20  16 18  '$ Temp: 98.4 F (36.9 C)     TempSrc: Oral     SpO2: 97%   (!) 87%  Weight:      Height:        Intake/Output Summary (Last 24 hours) at 07/17/2021 1135 Last data filed at 07/17/2021 0824 Gross per 24 hour  Intake 717 ml  Output 800 ml  Net -83 ml    Filed Weights   07/15/21 0437 07/15/21 2300 07/17/21 0519  Weight: 69.3 kg 72.9 kg 73.1 kg     Data Reviewed:   CBC: Recent Labs  Lab 07/11/21 0404 07/13/21 0329 07/15/21 0245 07/17/21 0955  WBC 13.7* 12.3* 13.8* 17.2*  NEUTROABS  --   --   --  16.0*  HGB 11.5* 11.6* 11.6* 13.6  HCT 35.2* 34.7* 34.2* 41.1  MCV 98.9 98.0 96.9 98.1  PLT 297 280 276 416    Basic Metabolic Panel: Recent Labs  Lab 07/12/21 0253 07/13/21 0329 07/15/21 0245  NA 138 138 140  K 5.0 4.6 4.0  CL 100 101 104  CO2 33* 31 32  GLUCOSE 125* 136* 146*  BUN '23 23 23  '$ CREATININE 0.93 0.83 0.82  CALCIUM 8.5* 8.4* 8.0*  MG  --  2.2  --     GFR: Estimated Creatinine Clearance: 82.6 mL/min (by C-G formula based on SCr of 0.82 mg/dL). Liver Function Tests: No results for input(s): AST, ALT, ALKPHOS, BILITOT, PROT, ALBUMIN in the last 168 hours. No results for input(s): LIPASE, AMYLASE in the last 168 hours. No results for input(s): AMMONIA in the last 168 hours. Coagulation Profile: No results for input(s): INR, PROTIME in the last 168 hours. Cardiac Enzymes: No results for input(s): CKTOTAL, CKMB, CKMBINDEX, TROPONINI in the last 168 hours. BNP (last 3 results) Recent Labs    05/29/21 1045 06/25/21 1439  PROBNP 926.0* 44.0    HbA1C: No results for  input(s): HGBA1C in the last 72 hours. CBG: No results for input(s): GLUCAP in the last 168 hours. Lipid Profile: No results for input(s): CHOL, HDL, LDLCALC, TRIG, CHOLHDL, LDLDIRECT in the last 72 hours. Thyroid Function Tests: No results for input(s): TSH, T4TOTAL, FREET4, T3FREE, THYROIDAB in the last 72 hours. Anemia Panel: No results for input(s): VITAMINB12, FOLATE, FERRITIN, TIBC, IRON, RETICCTPCT in the last 72 hours. Sepsis Labs: No results for input(s): PROCALCITON, LATICACIDVEN in the last 168 hours.   Recent Results (from the past 240 hour(s))  Resp Panel by RT-PCR (Flu A&B, Covid) Nasopharyngeal Swab     Status: None   Collection Time: 07/07/2021  7:45 AM   Specimen: Nasopharyngeal Swab; Nasopharyngeal(NP) swabs in vial transport medium  Result Value Ref Range Status   SARS  Coronavirus 2 by RT PCR NEGATIVE NEGATIVE Final    Comment: (NOTE) SARS-CoV-2 target nucleic acids are NOT DETECTED.  The SARS-CoV-2 RNA is generally detectable in upper respiratory specimens during the acute phase of infection. The lowest concentration of SARS-CoV-2 viral copies this assay can detect is 138 copies/mL. A negative result does not preclude SARS-Cov-2 infection and should not be used as the sole basis for treatment or other patient management decisions. A negative result may occur with  improper specimen collection/handling, submission of specimen other than nasopharyngeal swab, presence of viral mutation(s) within the areas targeted by this assay, and inadequate number of viral copies(<138 copies/mL). A negative result must be combined with clinical observations, patient history, and epidemiological information. The expected result is Negative.  Fact Sheet for Patients:  EntrepreneurPulse.com.au  Fact Sheet for Healthcare Providers:  IncredibleEmployment.be  This test is no t yet approved or cleared by the [place] FDA and  has been  authorized for detection and/or diagnosis of SARS-CoV-2 by FDA under an Emergency Use Authorization (EUA). This EUA will remain  in effect (meaning this test can be used) for the duration of the COVID-19 declaration under Section 564(b)(1) of the Act, 21 U.S.C.section 360bbb-3(b)(1), unless the authorization is terminated  or revoked sooner.       Influenza A by PCR NEGATIVE NEGATIVE Final   Influenza B by PCR NEGATIVE NEGATIVE Final    Comment: (NOTE) The Xpert Xpress SARS-CoV-2/FLU/RSV plus assay is intended as an aid in the diagnosis of influenza from Nasopharyngeal swab specimens and should not be used as a sole basis for treatment. Nasal washings and aspirates are unacceptable for Xpert Xpress SARS-CoV-2/FLU/RSV testing.  Fact Sheet for Patients: EntrepreneurPulse.com.au  Fact Sheet for Healthcare Providers: IncredibleEmployment.be  This test is not yet approved or cleared by the [place] FDA and has been authorized for detection and/or diagnosis of SARS-CoV-2 by FDA under an Emergency Use Authorization (EUA). This EUA will remain in effect (meaning this test can be used) for the duration of the COVID-19 declaration under Section 564(b)(1) of the Act, 21 U.S.C. section 360bbb-3(b)(1), unless the authorization is terminated or revoked.  Performed at [facility], [place] [address], [place], [place] [address]   Expectorated Sputum Assessment w Gram Stain, Rflx to Resp Cult     Status: None   Collection Time: 06/27/2021 10:25 AM   Specimen: Sputum  Result Value Ref Range Status   Specimen Description SPUTUM  Final   Special Requests NONE  Final   Sputum evaluation   Final    Sputum specimen not acceptable for testing.  Please recollect.   Gram Stain Report Called to,Read Back By and Verified With: RN AMancel Bale [phone] '@[id number]'$  FH Performed at [facility], [place] [address] [place], [place] [address]    Report Status 07/11/2021  FINAL  Final  MRSA Next Gen by PCR, Nasal     Status: None   Collection Time: 07/04/2021  5:42 PM   Specimen: Nasal Mucosa; Nasal Swab  Result Value Ref Range Status   MRSA by PCR Next Gen NOT DETECTED NOT DETECTED Final    Comment: (NOTE) The GeneXpert MRSA Assay (FDA approved for NASAL specimens only), is one component of a comprehensive MRSA colonization surveillance program. It is not intended to diagnose MRSA infection nor to guide or monitor treatment for MRSA infections. Test performance is not FDA approved in patients less than 54 years old. Performed at [facility], [place]  [address], [place], [place] [address]           Radiology Studies: DG CHEST PORT 1 VIEW  Result Date: 07/17/2021 CLINICAL DATA:  Shortness of breath and hypoxia. EXAM: PORTABLE CHEST 1 VIEW COMPARISON:  07/12/2021 FINDINGS: 0456 hours. Low lung volumes. The cardio pericardial silhouette is enlarged. Diffuse interstitial and left lower pleuroparenchymal opacity is similar to prior compatible with patient's known underlying interstitial lung disease. The visualized bony structures of the thorax are unremarkable. Telemetry leads overlie the chest. IMPRESSION: Stable exam. Diffuse interstitial and left lower pleuroparenchymal opacity compatible with patient's known underlying interstitial lung disease. Electronically Signed   By: Misty Stanley M.D.   On: 07/17/2021 05:31        Scheduled Meds:  apixaban  5 mg Oral BID   clonazePAM  1 mg Oral BID   diltiazem  120 mg Oral q AM   docusate sodium  100 mg Oral BID   gabapentin  600 mg Oral BID   mouth rinse  15 mL Mouth Rinse BID   Nintedanib  150 mg Oral BID   pantoprazole  40 mg Oral QHS   predniSONE  40 mg Oral Q breakfast   psyllium  1 packet Oral QHS   rosuvastatin  20 mg Oral Daily   sodium chloride flush  3 mL Intravenous Q12H   Continuous Infusions:  sodium chloride Stopped (07/14/21 0610)     LOS: 9 days    Darliss Cheney, MD [facility]  If 7PM-7AM, please contact night-coverage  07/17/2021, 11:35 AM

## 2021-07-18 DIAGNOSIS — J9611 Chronic respiratory failure with hypoxia: Secondary | ICD-10-CM | POA: Diagnosis not present

## 2021-07-18 MED ORDER — ALPRAZOLAM 0.25 MG PO TABS
0.2500 mg | ORAL_TABLET | Freq: Once | ORAL | Status: AC
  Administered 2021-07-18: 0.25 mg via ORAL
  Filled 2021-07-18: qty 1

## 2021-07-18 MED ORDER — MORPHINE SULFATE (PF) 2 MG/ML IV SOLN
1.0000 mg | INTRAVENOUS | Status: DC | PRN
  Administered 2021-07-19 (×2): 1 mg via INTRAVENOUS
  Administered 2021-07-19 – 2021-07-24 (×7): 2 mg via INTRAVENOUS
  Filled 2021-07-18 (×9): qty 1

## 2021-07-18 NOTE — Progress Notes (Signed)
Patient TX:Eduardo Fuentes      DOB: [birth year]      HOZ:[id number]      Palliative Medicine Team    Subjective: Bedside symptom check completed. Wife was bedside at time of visit.    Physical exam: Patient resting in bed, alert and conversational at time of visit. Breathing even and non-labored with high flow nasal cannula applied, no excessive secretions noted. Patient without physical or non-verbal signs of pain or discomfort at this time.    Assessment and plan: Patient denies any pain or discomfort at this time. He states that he does not feel short of breath unless moving or trying to pass his bowels. He also reports his stools have lessened up and he is feeling pretty good today. This RN acknowledged that he is on more oxygen today compared to days prior and that going home will not be possible while on this much oxygen. The patient felt he could get a concentrator at home to support up to 65L/minute. This RN reached out to both [employer] and [employer], unable to find any company that can go over 20L/min. This RN did explain to him that even if he could get enough oxygen support at home, the transition from hospital to home would need to be supported by less oxygen. He is understanding and describes it as approximately a 15 minute drive. Collaboration with bedside RN ongoing to try to wean oxygen with PRN morphine support. Bedside RN without any other needs or concerns at this time. Will continue to follow for any changes or advances, NP provider familiar with his case will be back tomorrow. This RN available for anything that comes up.    Thank you for allowing the Palliative Medicine Team to assist in the care of this patient.     Damian Leavell, MSN, RN Palliative Medicine Team Team Phone: [phone]  This phone is monitored 7a-7p, please reach out to attending physician outside of these hours for urgent needs.

## 2021-07-18 NOTE — Progress Notes (Signed)
Patient refused to take his Ofev last night, RN clarified if patient was sure that he wanted to skip this important medication. Patient states he can't take it on an empty stomach, he doesn't want anything to eat, and it gives him diarrhea. He also states he will be going home soon and missing a dose won't help or hurt. Held medication to respect patients wishes.

## 2021-07-18 NOTE — Plan of Care (Signed)
  Problem: Respiratory: Goal: Ability to maintain adequate ventilation will improve Outcome: Progressing   Problem: Clinical Measurements: Goal: Ability to maintain a body temperature in the normal range will improve Outcome: Progressing

## 2021-07-18 NOTE — Progress Notes (Signed)
Sedated PROGRESS NOTE    Eduardo Fuentes  UEA:[id number] DOB: 1950/09/14 DOA: 07/01/2021 PCP: Lujean Amel, MD   Brief Narrative:  71 year old male with HTN, pulmonary fibrosis on 8 L with activities at home, prostate cancer, comes into the hospital with shortness of breath.  He has been having IPF for a number of years, but over the last several months he became worse, and started on oxygen about 3 months ago.  He has been on 2 to 3 L, but when seen by pulmonology last Friday he was decreased to 8 L with exertion.  He continued to progress getting worse at home and eventually came to the hospital.  Sats were at 60% despite nasal cannula.  Pulmonary team and palliative care has been consulted.   Assessment & Plan:  Principal Problem:   Chronic respiratory failure with hypoxia (HCC) Active Problems:   Major depressive disorder, recurrent episode (HCC)   Panic disorder with agoraphobia   IPF (idiopathic pulmonary fibrosis) (HCC)   Paroxysmal atrial fibrillation (HCC) CHA2VASC2 = 3; on Eliiquis   Prolonged QT interval   Community acquired pneumonia     Acute on chronic respiratory failure with hypoxia Pulmonary fibrosis flare -Likely secondary to progression of IPF with superimposed pneumonia.  There is low suspicion for pulmonary embolism.   After his flare in April he was noted to require 2 L of oxygen at rest and up to 8 L on exertion.  -Seen by pulmonary, due to high amount of oxygen requirement, he was started on high-dose of Solu-Medrol which he received for 4 to 5 days and has been transitioned to oral prednisone on 07/17/2021 with the plan to taper 10 mg every 10 days per PCCM.  Completed 7 days of antibiotics on 07/15/2021.  Per reports, patient fell out of the bed overnight, no injury reported but he was hypoxic surrounding those events and he was escalated from 10 L nasal clear to 80% high flow nasal cannula and 40 L/min.  He is fully alert and oriented at this point in time.  Per  PCCM "May have to tolerate periods of desaturation into the low 80s or even the high 70s. He will desaturate easily with activity and take a while to recover, which is typical of end stage IPF".  They also had a long discussion with the patient and patient understands the prognosis here is quite poor and that all therapeutic interventions have been tried and have seemingly failed.  They also introduced the concept of hospice.  Patient was seen again by [facility] care on 07/17/2021 and he has now expressed his desire to go home with hospice but unfortunately, he is on very high amount of oxygen which cannot be supported/provided by hospice at home.  Per reports, maximum they can support his 15 L.  Palliative care as well as PCCM to discuss with the patient of options of transitioning to [facility]. -Currently on ofev, steroids.  Other as needed bronchodilators, Mucinex.     Prolonged QTc -Continue to monitor.  Avoid QTc prolonging medications.  Keep electrolytes within normal range.  Essential hypertension - Blood pressure times but remains very labile, continue Cardizem 120 mg daily, add amlodipine 5 mg p.o. daily today.   Hyperlipidemia -Continue Crestor   Depression/anxiety -Klonopin 1 mg twice daily   Paroxysmal A-fib  - Rates controlled on Cardizem and Eliquis  Diarrhea: Improving on Imodium.  Has had only 3 bowel movements in the last 24 hours.   DVT prophylaxis: Eliquis Code  Status: No CPR Family Communication: Wife at bedside.  Status is: Inpatient Remains inpatient appropriate because: Patient doing stable to be discharged home.  Subjective:  Seen and examined.  Continues to complain of diarrhea although he has had only 3 bowel movement in the last 24 hours.  Still dyspneic.  Examination:  General exam: Appears dyspneic and cachectic Respiratory system: Coarse breath sounds with rhonchi bilaterally. Respiratory effort normal. Cardiovascular system: S1 & S2 heard, RRR. No  JVD, murmurs, rubs, gallops or clicks. No pedal edema. Gastrointestinal system: Abdomen is nondistended, soft and nontender. No organomegaly or masses felt. Normal bowel sounds heard. Central nervous system: Alert and oriented. No focal neurological deficits. Extremities: Symmetric 5 x 5 power. Skin: No rashes, lesions or ulcers.  Psychiatry: Judgement and insight appear normal. Mood & affect appropriate.    Objective: Vitals:   07/18/21 0610 07/18/21 0800 07/18/21 0901 07/18/21 0902  BP: (!) 157/94 (!) 142/88    Pulse: 74 (!) 59    Resp:  18    Temp:  98.1 F (36.7 C)    TempSrc:  Oral    SpO2:  91% (!) 89% 92%  Weight:      Height:        Intake/Output Summary (Last 24 hours) at 07/18/2021 1258 Last data filed at 07/18/2021 9628 Gross per 24 hour  Intake 720 ml  Output 1650 ml  Net -930 ml    Filed Weights   07/15/21 2300 07/17/21 0519 07/18/21 0000  Weight: 72.9 kg 73.1 kg 73.4 kg     Data Reviewed:   CBC: Recent Labs  Lab 07/13/21 0329 07/15/21 0245 07/17/21 0955  WBC 12.3* 13.8* 17.2*  NEUTROABS  --   --  16.0*  HGB 11.6* 11.6* 13.6  HCT 34.7* 34.2* 41.1  MCV 98.0 96.9 98.1  PLT 280 276 366    Basic Metabolic Panel: Recent Labs  Lab 07/12/21 0253 07/13/21 0329 07/15/21 0245 07/17/21 0955  NA 138 138 140 140  K 5.0 4.6 4.0 4.1  CL 100 101 104 101  CO2 33* 31 32 34*  GLUCOSE 125* 136* 146* 101*  BUN '23 23 23 '$ 27*  CREATININE 0.93 0.83 0.82 0.77  CALCIUM 8.5* 8.4* 8.0* 8.5*  MG  --  2.2  --  2.4    GFR: Estimated Creatinine Clearance: 84.7 mL/min (by C-G formula based on SCr of 0.77 mg/dL). Liver Function Tests: No results for input(s): AST, ALT, ALKPHOS, BILITOT, PROT, ALBUMIN in the last 168 hours. No results for input(s): LIPASE, AMYLASE in the last 168 hours. No results for input(s): AMMONIA in the last 168 hours. Coagulation Profile: No results for input(s): INR, PROTIME in the last 168 hours. Cardiac Enzymes: No results for input(s):  CKTOTAL, CKMB, CKMBINDEX, TROPONINI in the last 168 hours. BNP (last 3 results) Recent Labs    05/29/21 1045 06/25/21 1439  PROBNP 926.0* 44.0    HbA1C: No results for input(s): HGBA1C in the last 72 hours. CBG: No results for input(s): GLUCAP in the last 168 hours. Lipid Profile: No results for input(s): CHOL, HDL, LDLCALC, TRIG, CHOLHDL, LDLDIRECT in the last 72 hours. Thyroid Function Tests: No results for input(s): TSH, T4TOTAL, FREET4, T3FREE, THYROIDAB in the last 72 hours. Anemia Panel: No results for input(s): VITAMINB12, FOLATE, FERRITIN, TIBC, IRON, RETICCTPCT in the last 72 hours. Sepsis Labs: No results for input(s): PROCALCITON, LATICACIDVEN in the last 168 hours.   Recent Results (from the past 240 hour(s))  MRSA Next Gen by PCR, Nasal  Status: None   Collection Time: 07/03/2021  5:42 PM   Specimen: Nasal Mucosa; Nasal Swab  Result Value Ref Range Status   MRSA by PCR Next Gen NOT DETECTED NOT DETECTED Final    Comment: (NOTE) The GeneXpert MRSA Assay (FDA approved for NASAL specimens only), is one component of a comprehensive MRSA colonization surveillance program. It is not intended to diagnose MRSA infection nor to guide or monitor treatment for MRSA infections. Test performance is not FDA approved in patients less than 37 years old. Performed at [facility], [place] [address] [place], [place] [address]           Radiology Studies: DG CHEST PORT 1 VIEW  Result Date: 07/17/2021 CLINICAL DATA:  Shortness of breath and hypoxia. EXAM: PORTABLE CHEST 1 VIEW COMPARISON:  07/12/2021 FINDINGS: 0456 hours. Low lung volumes. The cardio pericardial silhouette is enlarged. Diffuse interstitial and left lower pleuroparenchymal opacity is similar to prior compatible with patient's known underlying interstitial lung disease. The visualized bony structures of the thorax are unremarkable. Telemetry leads overlie the chest. IMPRESSION: Stable exam. Diffuse  interstitial and left lower pleuroparenchymal opacity compatible with patient's known underlying interstitial lung disease. Electronically Signed   By: Misty Stanley M.D.   On: 07/17/2021 05:31        Scheduled Meds:  amLODipine  5 mg Oral Daily   apixaban  5 mg Oral BID   clonazePAM  1 mg Oral BID   diltiazem  120 mg Oral q AM   docusate sodium  100 mg Oral BID   gabapentin  600 mg Oral BID   mouth rinse  15 mL Mouth Rinse BID   Nintedanib  150 mg Oral BID   pantoprazole  40 mg Oral QHS   predniSONE  40 mg Oral Q breakfast   psyllium  1 packet Oral QHS   rosuvastatin  20 mg Oral Daily   sodium chloride flush  3 mL Intravenous Q12H   Continuous Infusions:  sodium chloride Stopped (07/14/21 0610)     LOS: 10 days    Darliss Cheney, MD [facility]  If 7PM-7AM, please contact night-coverage  07/18/2021, 12:58 PM

## 2021-07-18 NOTE — Progress Notes (Addendum)
[employer] [facility]) Hospital Liaison Note  Referral received for patient/family interest in hospice at home. At this time, patient is requiring 30L of oxygen. We are unable to meet this need in the home.   [facility] liaisons will continue to follow and assist as needed. We are happy to assist if this oxygen requirement is 15L or less.   Please call with any questions or concerns. Thank you  Roselee Nova, [facility] Liaison  [phone]

## 2021-07-18 NOTE — TOC Progression Note (Signed)
Transition of Care [facility]) - Progression Note    Patient Details  Name: BRIGHTEN ORNDOFF MRN: [id number] Date of Birth: 1950-08-11  Transition of Care [facility]) CM/SW [place], RN Phone Number: 07/18/2021, 11:24 AM  Clinical Narrative:     Spoke to patient after consulting with hospice, [facility]. Patient iad called [facility] and stated they could provide oxygen at high liter flow. He is currently on 30 L.  If patient were to accept hospice, the highest the company they work with goes is 15L. I called his [place] and talked with Dealer. They can only supply 15 L as well.  He understands there would not be 24/7 care,  He knows that hospice would "take over' and prescribe his medications. Etc.Discussion with the care team regarding this He is requesting equipment, BSC, wheelchair, and RW, he states he does not need a bed.  CM continue to follow  Expected Discharge Plan: [place] Barriers to Discharge: Continued Medical Work up  Expected Discharge Plan and Services Expected Discharge Plan: [place]   Discharge Planning Services: CM Consult Post Acute Care Choice: Durable Medical Equipment, [place] arrangements for the past 2 months: Single Family Home                 DME Arranged: Walker rolling with seat, Shower stool DME Agency: [employer] Date DME Agency Contacted: 07/11/21 Time DME Agency Contacted: [id number] Representative spoke with at DME Agency: Jodell Cipro HH Arranged: PT, OT [place] Agency: [place] ([place]) Date [place]: 07/11/21 Time [place]: 1154 Representative spoke with at [place]: [place] ([place]) Interventions    Readmission Risk Interventions    07/11/2021   10:40 AM  Readmission Risk Prevention Plan  Transportation Screening Complete  PCP or Specialist Appt within 3-5 Days Complete  HRI or [place] Complete   Social Work Consult for [place] Planning/Counseling Complete  Palliative Care Screening Complete  Medication Review Press photographer) Complete

## 2021-07-19 DIAGNOSIS — R0602 Shortness of breath: Secondary | ICD-10-CM | POA: Diagnosis not present

## 2021-07-19 DIAGNOSIS — F4001 Agoraphobia with panic disorder: Secondary | ICD-10-CM | POA: Diagnosis not present

## 2021-07-19 DIAGNOSIS — J84112 Idiopathic pulmonary fibrosis: Secondary | ICD-10-CM | POA: Diagnosis not present

## 2021-07-19 DIAGNOSIS — J9611 Chronic respiratory failure with hypoxia: Secondary | ICD-10-CM | POA: Diagnosis not present

## 2021-07-19 NOTE — Progress Notes (Signed)
PT Cancellation Note  Patient Details Name: Eduardo Fuentes MRN: [id number] DOB: 1951-02-11   Cancelled Treatment:    Reason Eval/Treat Not Completed: Other (comment) Per chart review and discussion with OT, the pt is now pursing hospice care and would not like for therapies to continue while admitted. Will sign off to respect pt wishes, please feel free to re-consult if change in goals of care.   [name], PT, DPT   Acute Rehabilitation Department Pager #: [phone]   [name] 07/19/2021, 2:32 PM

## 2021-07-19 NOTE — Progress Notes (Signed)
Pt c/o feeling like he wakes up in a panic. Pt sleeps with mouth open, so I suggested increasing his flow to see if he was more comfortable. HFNC increased from 30L to 35L. Pt's RR has come down slightly. RT will continue to monitor and titrate HFNC as pt tolerates.

## 2021-07-19 NOTE — TOC Progression Note (Addendum)
Transition of Care [facility]) - Progression Note    Patient Details  Name: Eduardo Fuentes MRN: [id number] Date of Birth: 1950-03-13  Transition of Care [facility]) CM/SW [place], RN Phone Number: 07/19/2021, 2:39 PM  Clinical Narrative:     Observed notes from PT OT and Ms Alfredo Martinez regarding patient considering going to [facility]. He is worried about oxygen flow. I do not believe he fully understands that this would be his death. They can only take 15L of flow.  Asked Fabio Pierce, Rep for hospice [facility] to talk to patient. She gave me her number for him and the family to call.  The patient was sleeping when I went in the room, number left on overbed table, will check back with patient.   1540 Patient awake visiting with sister, who is visibly upset. Patient talking about going to [facility], thinks he will be on the same oxygen content. Explained the difference between hospital, Palliative and hospice services. Hospice  goal is comfort. [facility] or at home can only do 15L. Discussed and made a plan to have respiratory therapy come and change him over to 15L mask , just to see if he drops saturations, therefore would be unstable for ambulance transport, as they can only supply 15L NRB.   Patient and RN agreeable to this plan. [place] liaison and MD about plan and possibility that this may be a  hospital comfort measures if he is not stable for transport. The patient vocalized importance of seeing his wife and dog before he passes. He stated his wife will agree with any plan he sets. .   Expected Discharge Plan: [place] Barriers to Discharge: Continued Medical Work up  Expected Discharge Plan and Services Expected Discharge Plan: [place]   Discharge Planning Services: CM Consult Post Acute Care Choice: Durable Medical Equipment, Home Health Living arrangements for the past 2 months: Single Family Home                 DME  Arranged: Walker rolling with seat, Shower stool DME Agency: [employer] Date DME Agency Contacted: 07/11/21 Time DME Agency Contacted: 0962 Representative spoke with at DME Agency: Jodell Cipro HH Arranged: PT, OT [place] Agency: [place] ([place]) Date [place]: 07/11/21 Time [place]: 1154 Representative spoke with at [place]: [place] ([place]) Interventions    Readmission Risk Interventions    07/11/2021   10:40 AM  Readmission Risk Prevention Plan  Transportation Screening Complete  PCP or Specialist Appt within 3-5 Days Complete  HRI or [place] Complete  Social Work Consult for [place] Planning/Counseling Complete  Palliative Care Screening Complete  Medication Review Press photographer) Complete

## 2021-07-19 NOTE — Progress Notes (Signed)
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  He had a cardiac cath during that admission which showed nonobstructive coronary artery disease.  Now readmitted 5/15 with worsening dyspnea, hypoxia to the 50s.  Chest x-ray showed new right lung infiltrates.  After his flare in April he was noted to require 2 L of oxygen at rest and up to 8 L on exertion   Pertinent  Medical History   PMH significant for coronary artery calcification, afib, IPF, prostate CA, hyperlipidemia, GERD with sliding hiatal hernia, GAD, panic disorder with agoraphobia.  Cardiac Cath 05/30/2021 The left ventricular systolic function is normal.   LV end diastolic pressure is normal.   The left ventricular ejection fraction is 50-55% by visual estimate.   Hemodynamic findings consistent with mild pulmonary hypertension.   There is no aortic valve stenosis.   Mild, diffuse nonobstructive coronary artery disease.   Aortic saturation 94%, PA saturation 65%, PA pressure 55/21, mean PA pressure 34 mmHg, pulmonary capillary wedge pressure 9 mmHg, cardiac output 4.75 L/min, cardiac index 2.5.   Significant Hospital Events: Including procedures, antibiotic start and stop dates in addition to other pertinent events   Admitted 05/29/2021 for IPF/ CHF flare Admission 07/03/2021 with Pneumonia, CHF, vanco + cefepime initiated 5/24 Confused, fell overnight w/o injury. Hypoxic surrounding events > O2 increased from 10L to HFNC 80%/40LPM.    Subjective:  Afebrile  RN reports O2 decreased to 30L flow, 60% fiO2  Patient and wife asking lots of questions regarding plan for discharge.  Patient has  a lot of concerns about oxygen delivery.     Objective   Blood pressure (!) 139/97, pulse 84, temperature 98.9 F (37.2 C), temperature source Oral, resp. rate 18, height '5\' 9"'$  (1.753 m), weight 71.1 kg, SpO2 (!) 88 %.    FiO2 (%):  [50 %-80 %] 80 %   Intake/Output Summary (Last 24 hours) at 07/19/2021 1116 Last data filed at 07/19/2021 0032 Gross per 24 hour  Intake 240 ml  Output 1500 ml  Net -1260 ml   Filed Weights   07/17/21 0519 07/18/21 0000 07/19/21 0035  Weight: 73.1 kg 73.4 kg 71.1 kg    Examination: General: adult male sitting up in bed in NAD, wife at bedside  PSY: intermittently tearful  HEENT: MM pink/moist, anicteric, [place] O2 Neuro: AAOx4, speech clear, MAE  CV: s1s2 RRR, no m/r/g PULM: non-labored at rest, lungs bilaterally with crackles 2/3 way up  GI: soft, bsx4 active  Extremities: warm/dry, no edema  Skin: no rashes or lesions  Resolved Hospital Problem list     Assessment & Plan:   Acute on Chronic Hypoxemic Respiratory Failure IPF  Worsening R sided haziness.  Viral studies neg, MRSA PCR neg.  Aspiration vs. AE-IPF vs. Pulmonary edema vs. CAP vs. some combination.  Unfortunately his persistent hypoxia, clinical coarse, low procalcitonin all points to an IPF exacerbation -7 days antibiotics completed 5/22 -Not volume overloaded on exam.  P: -wean O2 for sats >88% -would tolerate periods of desaturation into the low 80's/high 70's, anticipate slow recovery > typical for end stage IPF  -completed solumedrol '240mg'$  x 5 days, transitioned to prednisone 40  mg QD 5/24 with plan for weaning by 10 mg every 10 days -pulmonary hygiene as able -mobilize  -continue nintedanib -Xopenex PRN  -PRN morphine for dyspnea or pain > anticipate use of this will escalate closer to end of life  -DNR   -pt discussing potential discharge to [facility] with primary team.  He and his wife do not feel home discharge is a realistic option   PCCM will continue to follow  intermittently.     Noe Gens, MSN, APRN, NP-C, AGACNP-BC [facility] 07/19/2021, 11:17 AM   Please see [email or web address] for pager details.   From 7A-7P if no response, please call [phone] After hours, please call ELink [phone]

## 2021-07-19 NOTE — Care Management Important Message (Signed)
Important Message  Patient Details  Name: Eduardo Fuentes MRN: [id number] Date of Birth: March 16, 1950   Medicare Important Message Given:  Yes     Shelda Altes 07/19/2021, 7:51 AM

## 2021-07-19 NOTE — Plan of Care (Signed)
  Problem: Respiratory: Goal: Ability to maintain adequate ventilation will improve Outcome: Not Progressing   Problem: Clinical Measurements: Goal: Ability to maintain a body temperature in the normal range will improve Outcome: Not Progressing

## 2021-07-19 NOTE — Progress Notes (Signed)
OT Cancellation Note  Patient Details Name: Eduardo Fuentes MRN: [id number] DOB: [birth year]   Cancelled Treatment:    Reason Eval/Treat Not Completed: Other (comment). Pt reports that he is going to [facility] Hospice and wishes to stop OT services. Acute OT will sign off.   Charna Neeb Elane Payten Hobin 07/19/2021, 12:08 PM

## 2021-07-19 NOTE — Progress Notes (Signed)
Manufacturing engineer [place]) Hospital Liaison Note   Referral received for patient/family interest in hospice at home. At this time, patient is requiring 30L of oxygen. We are unable to meet this need in the home.    [place] liaisons will continue to follow and assist as needed. We are happy to assist if this oxygen requirement is 15L or less for home hospice and 20L or less for [facility].    Please call with any questions or concerns. Thank you   Roselee Nova, [place] Hospital Liaison  [phone].

## 2021-07-19 NOTE — Progress Notes (Signed)
Speech Language Pathology Discharge Patient Details Name: Eduardo Fuentes MRN: [id number] DOB: 02-Oct-1950 Today's Date: 07/19/2021 Time: 1022-     Patient discharged from SLP services secondary to pt crying stating he was being discharged to [facility] place and understandably declined ST. He has been tolerating Dys 3 texture but can upgrade to regular if desires. .  Please see latest therapy progress note for current level of functioning and progress toward goals.    Progress and discharge plan discussed with patient and/or caregiver: Patient/Caregiver agrees with plan   Mick Sell Orbie Pyo 07/19/2021, 10:47 AM

## 2021-07-19 NOTE — Progress Notes (Signed)
[place] PROGRESS NOTE    Eduardo Fuentes  GBT:[id number] DOB: [birth year] DOA: 07/15/2021 PCP: Lujean Amel, MD   Brief Narrative:  71 year old male with HTN, pulmonary fibrosis on 8 L with activities at home, prostate cancer, comes into the hospital with shortness of breath.  He has been having IPF for a number of years, but over the last several months he became worse, and started on oxygen about 3 months ago.  He has been on 2 to 3 L, but when seen by pulmonology last Friday he was decreased to 8 L with exertion.  He continued to progress getting worse at home and eventually came to the hospital.  Sats were at 60% despite nasal cannula.  Pulmonary team and palliative care has been consulted.   Assessment & Plan:  Principal Problem:   Chronic respiratory failure with hypoxia (HCC) Active Problems:   Major depressive disorder, recurrent episode (HCC)   Panic disorder with agoraphobia   IPF (idiopathic pulmonary fibrosis) (HCC)   Paroxysmal atrial fibrillation (HCC) CHA2VASC2 = 3; on Eliiquis   Prolonged QT interval   Community acquired pneumonia     Acute on chronic respiratory failure with hypoxia Pulmonary fibrosis flare -Likely secondary to progression of IPF with superimposed pneumonia.  There is low suspicion for pulmonary embolism.   After his flare in April he was noted to require 2 L of oxygen at rest and up to 8 L on exertion.  -Seen by pulmonary, due to high amount of oxygen requirement, he was started on high-dose of Solu-Medrol which he received for 4 to 5 days and has been transitioned to oral prednisone on 07/17/2021 with the plan to taper 10 mg every 10 days per PCCM.  Completed 7 days of antibiotics on 07/15/2021.  Per reports, patient fell out of the bed overnight, no injury reported but he was hypoxic surrounding those events and he was escalated from 10 L nasal clear to 80% high flow nasal cannula and 40 L/min.  He is fully alert and oriented at this point in time.  Per  PCCM "May have to tolerate periods of desaturation into the low 80s or even the high 70s. He will desaturate easily with activity and take a while to recover, which is typical of end stage IPF".  They also had a long discussion with the patient and patient understands the prognosis here is quite poor and that all therapeutic interventions have been tried and have seemingly failed.  They also introduced the concept of hospice.  Patient was seen again by [facility] care on 07/17/2021 and he  expressed his desire to go home with hospice but unfortunately, he is on very high amount of oxygen which cannot be supported/provided by hospice at home.  Per reports, maximum they can support his 15 L.  I had a frank discussion with the patient.  He is fully aware that it may not be possible for him to able to go home due to high requirement of oxygen.  I laid out to options for him, one would be to either transition to full comfort care here and to stop oxygen but focus on comfort, the other 1 would be to transfer/discharge to [facility].  He has asked 1 to 2 days to discuss with the family and then he will let us know.   Prolonged QTc -Continue to monitor.  Avoid QTc prolonging medications.  Keep electrolytes within normal range.  Essential hypertension - Blood pressure times but remains very labile, continue Cardizem  120 mg daily, add amlodipine 5 mg p.o. daily today.   Hyperlipidemia -Continue Crestor   Depression/anxiety -Klonopin 1 mg twice daily   Paroxysmal A-fib  - Rates controlled on Cardizem and Eliquis  Diarrhea: Improving on Imodium.  Has had only 3 bowel movements in the last 24 hours.   DVT prophylaxis: Eliquis Code Status: No CPR Family Communication: None at bedside.  Status is: Inpatient Remains inpatient appropriate because: Patient wants to go home with hospice but he is on high amount of oxygen which cannot be supported at home.  Subjective:  Seen and examined.  Diarrhea is  improving.  He is happy with that.  No other complaint.  Breathing still is hard but has not changed compared to yesterday.  Examination:  General exam: Appears calm and comfortable, cachectic Respiratory system: Rhonchi bilaterally with coarse breath sounds. Respiratory effort normal. Cardiovascular system: S1 & S2 heard, RRR. No JVD, murmurs, rubs, gallops or clicks. No pedal edema. Gastrointestinal system: Abdomen is nondistended, soft and nontender. No organomegaly or masses felt. Normal bowel sounds heard. Central nervous system: Alert and oriented. No focal neurological deficits. Extremities: Symmetric 5 x 5 power. Skin: No rashes, lesions or ulcers.  Psychiatry: Judgement and insight appear normal. Mood & affect appropriate.    Objective: Vitals:   07/19/21 0400 07/19/21 0619 07/19/21 0711 07/19/21 0822  BP: 140/86  (!) 139/97   Pulse: 84     Resp: (!) 25 18    Temp: 98.9 F (37.2 C)     TempSrc: Oral     SpO2: 90% 90%  (!) 88%  Weight:      Height:        Intake/Output Summary (Last 24 hours) at 07/19/2021 1048 Last data filed at 07/19/2021 0032 Gross per 24 hour  Intake 240 ml  Output 1500 ml  Net -1260 ml    Filed Weights   07/17/21 0519 07/18/21 0000 07/19/21 0035  Weight: 73.1 kg 73.4 kg 71.1 kg     Data Reviewed:   CBC: Recent Labs  Lab 07/13/21 0329 07/15/21 0245 07/17/21 0955  WBC 12.3* 13.8* 17.2*  NEUTROABS  --   --  16.0*  HGB 11.6* 11.6* 13.6  HCT 34.7* 34.2* 41.1  MCV 98.0 96.9 98.1  PLT 280 276 509    Basic Metabolic Panel: Recent Labs  Lab 07/13/21 0329 07/15/21 0245 07/17/21 0955  NA 138 140 140  K 4.6 4.0 4.1  CL 101 104 101  CO2 31 32 34*  GLUCOSE 136* 146* 101*  BUN 23 23 27*  CREATININE 0.83 0.82 0.77  CALCIUM 8.4* 8.0* 8.5*  MG 2.2  --  2.4    GFR: Estimated Creatinine Clearance: 84.7 mL/min (by C-G formula based on SCr of 0.77 mg/dL). Liver Function Tests: No results for input(s): AST, ALT, ALKPHOS, BILITOT,  PROT, ALBUMIN in the last 168 hours. No results for input(s): LIPASE, AMYLASE in the last 168 hours. No results for input(s): AMMONIA in the last 168 hours. Coagulation Profile: No results for input(s): INR, PROTIME in the last 168 hours. Cardiac Enzymes: No results for input(s): CKTOTAL, CKMB, CKMBINDEX, TROPONINI in the last 168 hours. BNP (last 3 results) Recent Labs    05/29/21 1045 06/25/21 1439  PROBNP 926.0* 44.0    HbA1C: No results for input(s): HGBA1C in the last 72 hours. CBG: No results for input(s): GLUCAP in the last 168 hours. Lipid Profile: No results for input(s): CHOL, HDL, LDLCALC, TRIG, CHOLHDL, LDLDIRECT in the last 72 hours.  Thyroid Function Tests: No results for input(s): TSH, T4TOTAL, FREET4, T3FREE, THYROIDAB in the last 72 hours. Anemia Panel: No results for input(s): VITAMINB12, FOLATE, FERRITIN, TIBC, IRON, RETICCTPCT in the last 72 hours. Sepsis Labs: No results for input(s): PROCALCITON, LATICACIDVEN in the last 168 hours.   No results found for this or any previous visit (from the past 240 hour(s)).         Radiology Studies: No results found.      Scheduled Meds:  amLODipine  5 mg Oral Daily   apixaban  5 mg Oral BID   clonazePAM  1 mg Oral BID   diltiazem  120 mg Oral q AM   docusate sodium  100 mg Oral BID   gabapentin  600 mg Oral BID   mouth rinse  15 mL Mouth Rinse BID   Nintedanib  150 mg Oral BID   pantoprazole  40 mg Oral QHS   predniSONE  40 mg Oral Q breakfast   psyllium  1 packet Oral QHS   rosuvastatin  20 mg Oral Daily   sodium chloride flush  3 mL Intravenous Q12H   Continuous Infusions:  sodium chloride Stopped (07/14/21 0610)     LOS: 11 days    Darliss Cheney, MD [facility]  If 7PM-7AM, please contact night-coverage  07/19/2021, 10:48 AM

## 2021-07-20 DIAGNOSIS — R0602 Shortness of breath: Secondary | ICD-10-CM | POA: Diagnosis not present

## 2021-07-20 DIAGNOSIS — J9611 Chronic respiratory failure with hypoxia: Secondary | ICD-10-CM | POA: Diagnosis not present

## 2021-07-20 DIAGNOSIS — F4001 Agoraphobia with panic disorder: Secondary | ICD-10-CM | POA: Diagnosis not present

## 2021-07-20 DIAGNOSIS — J84112 Idiopathic pulmonary fibrosis: Secondary | ICD-10-CM | POA: Diagnosis not present

## 2021-07-20 NOTE — Progress Notes (Signed)
Pt found with Sat of 60s. Pt had a sip of water that "went down the wrong tube", Increased FIO2 to 100 and flow to 30L. Pt slowly increased Sats to 85%. PT able to cough and clear airway. Monitoring closely

## 2021-07-20 NOTE — Progress Notes (Signed)
Palliative Medicine Progress Note   Patient Name: Eduardo Fuentes       Date: 07/20/2021 DOB: 12/03/1950  Age: 71 y.o. MRN#: [id number] Attending Physician: Darliss Cheney, MD Primary Care Physician: Lujean Amel, MD Admit Date: 07/19/2021   HPI/Patient Profile: 71 y.o. male  with past medical history of IPF, atrial fibrillation, prostate cancer, coronary artery calcification, sliding hiatal hernia, generalized anxiety with panic disorder and agoraphobia, GERD, and hyperlipidemia.  He presented to the emergency department on 07/17/2021 with 2 days of worsening dyspnea.  He had been seen in the pulmonary office 5/12 and recommended to increase O2 to 8L on exertion.  Later that evening he had acute onset of worsening dyspnea which became progressively worse over the next 2 days. He had not increased his oxygen to 8 L as recommended due to the lack of the concentrator in his home. On arrival to the ED, his O2 sats were in the 19s.  He quickly improved to the upper 90s with nonrebreather.  Chest x-ray showed increased opacification in the right lung suspicious for pneumonia.  Labs significant for WBC 14.9 and BNP 1048.  Troponin elevated at 149, but is suspected to be demand ischemia.  He had been advised to stop Lasix as he did not look volume overloaded, and most recent BNP was 44. He is admitted to [facility] with pneumonia, acute on chronic hypoxic respiratory failure, and acute on chronic CHF.    Subjective: Chart reviewed.  Note that overnight patient desaturated to the 60s after aspirating on water and that O2 was increased to 30 L. Per MAR, he has received 11 mg IV morphine in the past 24 hours.  Patient is lying in bed awake.  States he feels "fair".  He is currently on 25 L HFNC.  He reports that the  IV morphine has been helping to relieve his shortness of breath and to "calm him down".  His wife Eduardo Fuentes is at bedside.  We discussed that maximum oxygen at home is 15 L, and that maximum oxygen at [facility] is 20 L.  Eduardo Fuentes understands that home with hospice is not an option at this point due to his high oxygen requirements.  We discussed that [facility] may still be an option if we can wean his oxygen to 20 L.  Discussed concern that  he desaturates so easily with even minimal exertion and they would not be able to increase his oxygen once at [facility].  Eduardo Fuentes is considering that he will likely remain in the hospital for end-of-life.  He is tearful.  He ask if his dog Eduardo Fuentes can come visit.  I provided reassurance that I would facilitate this process.  Plan of care discussed with Dr. Sallee Lange and patient's bedside RN.   Objective:  Physical Exam Vitals reviewed.  Constitutional:      Appearance: He is ill-appearing.  Pulmonary:     Effort: Tachypnea present.  Neurological:     Mental Status: He is alert and oriented to person, place, and time.  Psychiatric:        Mood and Affect: Affect is tearful.            Vital Signs: BP 138/84 (BP Location: Right Arm)   Pulse 79   Temp 98.4 F (36.9 C) (Oral)   Resp (!) 29   Ht '5\' 9"'$  (1.753 m)   Wt 70.7 kg   SpO2 94%   BMI 23.02 kg/m  SpO2: SpO2: 94 % O2 Device: O2 Device: High Flow Nasal Cannula O2 Flow Rate: O2 Flow Rate (L/min): 25 L/min   LBM: Last BM Date : 07/18/21     Palliative Assessment/Data: PPS 30%     Palliative Medicine Assessment & Plan   Assessment: Principal Problem:   Chronic respiratory failure with hypoxia (HCC) Active Problems:   Major depressive disorder, recurrent episode (HCC)   Panic disorder with agoraphobia   IPF (idiopathic pulmonary fibrosis) (HCC)   Paroxysmal atrial fibrillation (HCC) CHA2VASC2 = 3; on Eliiquis   Prolonged QT interval   Community acquired pneumonia     Recommendations/Plan: DNR/DNI as previously documented Continue current interventions for now Please allow patient's dog to visit Continue morphine 1 to 2 mg IV every 2 hours as needed for pain or shortness of breath PMT will follow-up tomorrow to discuss transition to full comfort care  Prognosis:  poor  Discharge Planning: Likely hospital death   Thank you for allowing the Palliative Medicine Team to assist in the care of this patient.   MDM - High   Lavena Bullion, NP   Please contact Palliative Medicine Team phone at [phone] for questions and concerns.  For individual providers, please see AMION.

## 2021-07-20 NOTE — Progress Notes (Signed)
DetailedSedated PROGRESS NOTE    Eduardo Fuentes  KVQ:[id number] DOB: May 09, 1950 DOA: 07/23/2021 PCP: Lujean Amel, MD   Brief Narrative:  71 year old male with HTN, pulmonary fibrosis on 8 L with activities at home, prostate cancer, comes into the hospital with shortness of breath.  He has been having IPF for a number of years, but over the last several months he became worse, and started on oxygen about 3 months ago.  He has been on 2 to 3 L, but when seen by pulmonology last Friday he was decreased to 8 L with exertion.  He continued to progress getting worse at home and eventually came to the hospital.  Sats were at 60% despite nasal cannula.  Pulmonary team and palliative care has been consulted.   Assessment & Plan:  Principal Problem:   Chronic respiratory failure with hypoxia (HCC) Active Problems:   Major depressive disorder, recurrent episode (HCC)   Panic disorder with agoraphobia   IPF (idiopathic pulmonary fibrosis) (HCC)   Paroxysmal atrial fibrillation (HCC) CHA2VASC2 = 3; on Eliiquis   Prolonged QT interval   Community acquired pneumonia     Acute on chronic respiratory failure with hypoxia Pulmonary fibrosis flare -Likely secondary to progression of IPF with superimposed pneumonia.  There is low suspicion for pulmonary embolism.   After his flare in April he was noted to require 2 L of oxygen at rest and up to 8 L on exertion.  -Seen by pulmonary, due to high amount of oxygen requirement, he was started on high-dose of Solu-Medrol which he received for 4 to 5 days and has been transitioned to oral prednisone on 07/17/2021 with the plan to taper 10 mg every 10 days per PCCM.  Completed 7 days of antibiotics on 07/15/2021.  Per reports, patient fell out of the bed overnight, no injury reported but he was hypoxic surrounding those events and he was escalated from 10 L nasal clear to 80% high flow nasal cannula and 40 L/min.  He is fully alert and oriented at this point in time.   Per PCCM "May have to tolerate periods of desaturation into the low 80s or even the high 70s. He will desaturate easily with activity and take a while to recover, which is typical of end stage IPF".  They also had a long discussion with the patient and patient understands the prognosis here is quite poor and that all therapeutic interventions have been tried and have seemingly failed.  They also introduced the concept of hospice.  Patient was seen again by palliative care on 07/17/2021 and he expressed his desire to go home with hospice but unfortunately, he is on very high amount of oxygen which cannot be supported/provided by hospice at home.  Per reports, maximum they can support his 15 L and maximum amount of oxygen that [facility] cannot support his 20 L.  Patient is requiring much higher amount of oxygen.  I had another lengthy discussion with the patient basically trying to explain that he may not be able to be well enough and be weaned from oxygen down enough to be able to discharge to any of those places and that he may need to consider hospice and the hospital.  I also spoke to palliative care and requested seeing this patient on daily basis.   Prolonged QTc -Continue to monitor.  Avoid QTc prolonging medications.  Keep electrolytes within normal range.  Essential hypertension - Blood pressure now controlled.  Continue Cardizem 120 mg daily, add amlodipine  5 mg p.o. daily today.   Hyperlipidemia -Continue Crestor   Depression/anxiety -Klonopin 1 mg twice daily   Paroxysmal A-fib  - Rates controlled on Cardizem and Eliquis  Diarrhea: Improving on Imodium.   DVT prophylaxis: Eliquis Code Status: No CPR Family Communication: None at bedside.  Status is: Inpatient Remains inpatient appropriate because: Patient wants to go home with hospice but he is on high amount of oxygen which cannot be supported at home.  Subjective:  Patient seen and examined.  No new complaint.  Details  about Examination:  General exam: Appears calm and comfortable, cachectic Respiratory system: Rhonchi and coarse breath sounds bilaterally, tachypneic Cardiovascular system: S1 & S2 heard, RRR. No JVD, murmurs, rubs, gallops or clicks. No pedal edema. Gastrointestinal system: Abdomen is nondistended, soft and nontender. No organomegaly or masses felt. Normal bowel sounds heard. Central nervous system: Alert and oriented. No focal neurological deficits. Extremities: Symmetric 5 x 5 power. Skin: No rashes, lesions or ulcers.  Psychiatry: Judgement and insight appear normal. Mood & affect appropriate.   Objective: Vitals:   07/20/21 0419 07/20/21 0611 07/20/21 0800 07/20/21 0911  BP: 133/82 133/84 138/84   Pulse: 78  92 79  Resp: (!) 28  (!) 25 (!) 29  Temp:   98.4 F (36.9 C)   TempSrc:   Oral   SpO2: 97%  98% 94%  Weight:      Height:        Intake/Output Summary (Last 24 hours) at 07/20/2021 1056 Last data filed at 07/20/2021 0836 Gross per 24 hour  Intake 480 ml  Output 1550 ml  Net -1070 ml    Filed Weights   07/18/21 0000 07/19/21 0035 07/20/21 0003  Weight: 73.4 kg 71.1 kg 70.7 kg     Data Reviewed:   CBC: Recent Labs  Lab 07/15/21 0245 07/17/21 0955  WBC 13.8* 17.2*  NEUTROABS  --  16.0*  HGB 11.6* 13.6  HCT 34.2* 41.1  MCV 96.9 98.1  PLT 276 756    Basic Metabolic Panel: Recent Labs  Lab 07/15/21 0245 07/17/21 0955  NA 140 140  K 4.0 4.1  CL 104 101  CO2 32 34*  GLUCOSE 146* 101*  BUN 23 27*  CREATININE 0.82 0.77  CALCIUM 8.0* 8.5*  MG  --  2.4    GFR: Estimated Creatinine Clearance: 84.7 mL/min (by C-G formula based on SCr of 0.77 mg/dL). Liver Function Tests: No results for input(s): AST, ALT, ALKPHOS, BILITOT, PROT, ALBUMIN in the last 168 hours. No results for input(s): LIPASE, AMYLASE in the last 168 hours. No results for input(s): AMMONIA in the last 168 hours. Coagulation Profile: No results for input(s): INR, PROTIME in the  last 168 hours. Cardiac Enzymes: No results for input(s): CKTOTAL, CKMB, CKMBINDEX, TROPONINI in the last 168 hours. BNP (last 3 results) Recent Labs    05/29/21 1045 06/25/21 1439  PROBNP 926.0* 44.0    HbA1C: No results for input(s): HGBA1C in the last 72 hours. CBG: No results for input(s): GLUCAP in the last 168 hours. Lipid Profile: No results for input(s): CHOL, HDL, LDLCALC, TRIG, CHOLHDL, LDLDIRECT in the last 72 hours. Thyroid Function Tests: No results for input(s): TSH, T4TOTAL, FREET4, T3FREE, THYROIDAB in the last 72 hours. Anemia Panel: No results for input(s): VITAMINB12, FOLATE, FERRITIN, TIBC, IRON, RETICCTPCT in the last 72 hours. Sepsis Labs: No results for input(s): PROCALCITON, LATICACIDVEN in the last 168 hours.   No results found for this or any previous visit (from the  past 240 hour(s)).         Radiology Studies: No results found.      Scheduled Meds:  amLODipine  5 mg Oral Daily   apixaban  5 mg Oral BID   clonazePAM  1 mg Oral BID   diltiazem  120 mg Oral q AM   docusate sodium  100 mg Oral BID   gabapentin  600 mg Oral BID   mouth rinse  15 mL Mouth Rinse BID   Nintedanib  150 mg Oral BID   pantoprazole  40 mg Oral QHS   predniSONE  40 mg Oral Q breakfast   psyllium  1 packet Oral QHS   rosuvastatin  20 mg Oral Daily   sodium chloride flush  3 mL Intravenous Q12H   Continuous Infusions:  sodium chloride Stopped (07/14/21 0610)     LOS: 12 days    Darliss Cheney, MD [facility]  If 7PM-7AM, please contact night-coverage  07/20/2021, 10:56 AM

## 2021-07-21 DIAGNOSIS — Z515 Encounter for palliative care: Secondary | ICD-10-CM | POA: Diagnosis not present

## 2021-07-21 DIAGNOSIS — R0600 Dyspnea, unspecified: Secondary | ICD-10-CM

## 2021-07-21 DIAGNOSIS — J9611 Chronic respiratory failure with hypoxia: Secondary | ICD-10-CM | POA: Diagnosis not present

## 2021-07-21 DIAGNOSIS — J84112 Idiopathic pulmonary fibrosis: Secondary | ICD-10-CM | POA: Diagnosis not present

## 2021-07-21 LAB — CBC
HCT: 40.2 % (ref 39.0–52.0)
Hemoglobin: 12.9 g/dL — ABNORMAL LOW (ref 13.0–17.0)
MCH: 31.6 pg (ref 26.0–34.0)
MCHC: 32.1 g/dL (ref 30.0–36.0)
MCV: 98.5 fL (ref 80.0–100.0)
Platelets: 238 10*3/uL (ref 150–400)
RBC: 4.08 MIL/uL — ABNORMAL LOW (ref 4.22–5.81)
RDW: 14.9 % (ref 11.5–15.5)
WBC: 17.2 10*3/uL — ABNORMAL HIGH (ref 4.0–10.5)
nRBC: 0 % (ref 0.0–0.2)

## 2021-07-21 LAB — BASIC METABOLIC PANEL
Anion gap: 5 (ref 5–15)
BUN: 15 mg/dL (ref 8–23)
CO2: 35 mmol/L — ABNORMAL HIGH (ref 22–32)
Calcium: 8.1 mg/dL — ABNORMAL LOW (ref 8.9–10.3)
Chloride: 98 mmol/L (ref 98–111)
Creatinine, Ser: 0.83 mg/dL (ref 0.61–1.24)
GFR, Estimated: >60 mL/min (ref >60)
Glucose, Bld: 125 mg/dL — ABNORMAL HIGH (ref 70–99)
Potassium: 3.9 mmol/L (ref 3.5–5.1)
Sodium: 138 mmol/L (ref 135–145)

## 2021-07-21 LAB — MAGNESIUM: Magnesium: 2 mg/dL (ref 1.7–2.4)

## 2021-07-21 MED ORDER — LORAZEPAM 2 MG/ML IJ SOLN
1.0000 mg | INTRAMUSCULAR | Status: DC | PRN
  Administered 2021-07-21 – 2021-07-22 (×2): 1 mg via INTRAVENOUS
  Administered 2021-07-23 – 2021-07-24 (×3): 2 mg via INTRAVENOUS
  Filled 2021-07-21 (×6): qty 1

## 2021-07-21 MED ORDER — MORPHINE SULFATE (PF) 2 MG/ML IV SOLN
1.0000 mg | INTRAVENOUS | Status: DC
  Administered 2021-07-21 – 2021-07-24 (×19): 2 mg via INTRAVENOUS
  Filled 2021-07-21 (×20): qty 1

## 2021-07-21 NOTE — Progress Notes (Signed)
RN called d/t pt desat to mid 80's%.  Pt was awake and in no distress.  Per pt, he recently was sleeping and then woke up to eat, was also visiting w/ family when desat event happened.  Noted sat 84-87% on 80% fio2 and 20 L.  Fio2 was increased to 100% and flow incrased to 25 LPM.  Sat improved to 94-95%. No distress noted currently, RN in room an aware.

## 2021-07-21 NOTE — Plan of Care (Signed)
°  Problem: Respiratory: °Goal: Ability to maintain adequate ventilation will improve °Outcome: Progressing °  °

## 2021-07-21 NOTE — Progress Notes (Signed)
Palliative Medicine Progress Note   Patient Name: Eduardo Fuentes       Date: 07/21/2021 DOB: 06-01-1950  Age: 71 y.o. MRN#: [id number] Attending Physician: Darliss Cheney, MD Primary Care Physician: Lujean Amel, MD Admit Date: 06/28/2021    HPI/Patient Profile: 71 y.o. male  with past medical history of IPF, atrial fibrillation, prostate cancer, coronary artery calcification, sliding hiatal hernia, generalized anxiety with panic disorder and agoraphobia, GERD, and hyperlipidemia.  He presented to the emergency department on 07/03/2021 with worsening dyspnea.  He had been seen in the pulmonary office 5/12 and recommended to increase O2 to 8L on exertion.  Later that evening he had acute onset of worsening dyspnea which became progressively worse over the next 2 days. He had not increased his oxygen to 8 L as recommended due to the lack of the concentrator in his home. On arrival to the ED, his O2 sats were in the 63s.  He quickly improved to the upper 90s with nonrebreather. Chest x-ray showed increased opacification in the right lung suspicious for pneumonia.   He is admitted to [facility] with pneumonia, acute on chronic hypoxic respiratory failure, and acute on chronic CHF.  5/24 - Per Pulmonary, patient has not had meaningful improvement despite high-dose IV Solu-Medrol for 5 days.  Poor prognosis was discussed and hospice was introduced.  Patient initially was hopeful to go home with hospice, but unfortunately his oxygen requirements have been too high to make this a viable option.  Subjective: I visited patient at bedside.  He is currently on 20 L HFNC.  His wife Arrie Aran is at bedside.  They expressed appreciation for PMT assistance with having their dog [name] visit yesterday.  I discussed with Ashly  and [name] that [employer] is now an option on his current oxygen requirements.  However, it would be with the understanding that oxygen could not be increased above 20 L.    [place] has decided that he would prefer to remain in the hospital for end-of-life.  He expresses concern that [name] needs additional support.  I note that she does appear physically frail and emotionally distressed.  [name]'s 2 nieces Barnetta Chapel and Maudie Mercury) are returning from [employer].  Discussed transitioning to comfort care when they are able to be present to support [name] through this process.  Fritz Pickerel verbalizes  that he also wants his sister Jenny Reichmann and his pastor to be present when he "takes his last breath". Sylvester reports that morphine continues to help with his dyspnea.  Discussed scheduling it every 4 hours - Rachid is agreeable and appreciative.  Also discussed adding IV Ativan as needed to help with anxiety.  Created space and opportunity for patient and family to express thoughts and feelings regarding patient's current medical situation. Emotional support provided.   Plan of care discussed with Dr. Doristine Bosworth and patient's bedside RN.  Objective:  Physical Exam Vitals reviewed.  Constitutional:      General: He is not in acute distress.    Appearance: He is ill-appearing.  Pulmonary:     Effort: Pulmonary effort is normal. Tachypnea present.  Neurological:     Mental Status: He is alert and oriented to person, place, and time.  Psychiatric:        Mood and Affect: Affect is tearful.            Vital Signs: BP 124/86   Pulse 88   Temp (!) 97.5 F (36.4 C) (Oral)   Resp (!) 25   Ht '5\' 9"'$  (1.753 m)   Wt 70.4 kg   SpO2 92%   BMI 22.92 kg/m  SpO2: SpO2: 92 % O2 Device: O2 Device: High Flow Nasal Cannula O2 Flow Rate: O2 Flow Rate (L/min): 20 L/min    LBM: Last BM Date : 07/18/21     Palliative Assessment/Data: PPS 30%     Palliative Medicine Assessment & Plan   Assessment: Principal  Problem:   Chronic respiratory failure with hypoxia (HCC) Active Problems:   Major depressive disorder, recurrent episode (HCC)   Panic disorder with agoraphobia   IPF (idiopathic pulmonary fibrosis) (HCC)   Paroxysmal atrial fibrillation (HCC) CHA2VASC2 = 3; on Eliiquis   Prolonged QT interval   Community acquired pneumonia    Recommendations/Plan: Start scheduled morphine 1-2 mg every 4 hours PRN morphine 1-2 mg IV every 2 hours as needed for breakthrough pain or shortness of breath PRN Ativan 1-2 mg IV every 4 hours as needed for anxiety D/C Ofev, rosuvastatin, and hydrocodone-acetaminophen Awaiting patient's nieces to return from vacation We will likely transition to full comfort care tomorrow afternoon or Tuesday   Code Status: DNR/DNI as previously documented  Prognosis:  poor  Discharge Planning: Anticipated Hospital Death    Thank you for allowing the Palliative Medicine Team to assist in the care of this patient.   MDM - High   Lavena Bullion, NP   Please contact Palliative Medicine Team phone at [phone] for questions and concerns.  For individual providers, please see AMION.

## 2021-07-21 NOTE — Progress Notes (Signed)
DetailedSedated PROGRESS NOTE    Eduardo Fuentes  CHE:[id number] DOB: 12/22/1950 DOA: 07/06/2021 PCP: Lujean Amel, MD   Brief Narrative:  71 year old male with HTN, pulmonary fibrosis on 8 L with activities at home, prostate cancer, comes into the hospital with shortness of breath.  He has been having IPF for a number of years, but over the last several months he became worse, and started on oxygen about 3 months ago.  He has been on 2 to 3 L, but when seen by pulmonology last Friday he was decreased to 8 L with exertion.  He continued to progress getting worse at home and eventually came to the hospital.  Sats were at 60% despite nasal cannula.  Pulmonary team and palliative care has been consulted.   Assessment & Plan:  Principal Problem:   Chronic respiratory failure with hypoxia (HCC) Active Problems:   Major depressive disorder, recurrent episode (HCC)   Panic disorder with agoraphobia   IPF (idiopathic pulmonary fibrosis) (HCC)   Paroxysmal atrial fibrillation (HCC) CHA2VASC2 = 3; on Eliiquis   Prolonged QT interval   Community acquired pneumonia     Acute on chronic respiratory failure with hypoxia Pulmonary fibrosis flare -Likely secondary to progression of IPF with superimposed pneumonia.  There is low suspicion for pulmonary embolism.   After his flare in April he was noted to require 2 L of oxygen at rest and up to 8 L on exertion.  -Seen by pulmonary, due to high amount of oxygen requirement, he was started on high-dose of Solu-Medrol which he received for 4 to 5 days and has been transitioned to oral prednisone on 07/17/2021 with the plan to taper 10 mg every 10 days per PCCM.  Completed 7 days of antibiotics on 07/15/2021.  Per reports, patient fell out of the bed overnight, no injury reported but he was hypoxic surrounding those events and he was escalated from 10 L nasal clear to 80% high flow nasal cannula and 40 L/min.  He is fully alert and oriented at this point in time.   Per PCCM "May have to tolerate periods of desaturation into the low 80s or even the high 70s. He will desaturate easily with activity and take a while to recover, which is typical of end stage IPF".  They also had a long discussion with the patient and patient understands the prognosis here is quite poor and that all therapeutic interventions have been tried and have seemingly failed.  They also introduced the concept of hospice.  Patient was seen again by palliative care on 07/17/2021 and he expressed his desire to go home with hospice but unfortunately, he is on very high amount of oxygen which cannot be supported/provided by hospice at home.  Per reports, maximum they can support his 15 L and maximum amount of oxygen that [facility] cannot support his 20 L.  He is down to 20 L right now but that is at rest, he is going to desaturate with activity.  Palliative care in the room when I saw this patient today.  They were talking about possibly transitioning to [facility] but patient prefers to stay in the hospital.  Appreciate palliative care help.   Prolonged QTc -Continue to monitor.  Avoid QTc prolonging medications.  Keep electrolytes within normal range.  Essential hypertension - Blood pressure now controlled.  Continue Cardizem 120 mg daily, add amlodipine 5 mg p.o. daily today.   Hyperlipidemia -Continue Crestor   Depression/anxiety -Klonopin 1 mg twice daily  Paroxysmal A-fib  - Rates controlled on Cardizem and Eliquis  Diarrhea: Improving on Imodium.   DVT prophylaxis: Eliquis Code Status: No CPR Family Communication: None at bedside.  Status is: Inpatient Remains inpatient appropriate because: Patient wants to go home with hospice but he is on high amount of oxygen which cannot be supported at home.  Subjective:  Seen and examined.  Wife and palliative care at bedside.  Another deep discussion about the plan of care was carried on.  Wife emotional and tearful.  She was  supported and reassured.  Examination:  General exam: Appears calm and comfortable  Respiratory system: Breath sounds with rhonchi bilaterally, tachypneic. Cardiovascular system: S1 & S2 heard, RRR. No JVD, murmurs, rubs, gallops or clicks. No pedal edema. Gastrointestinal system: Abdomen is nondistended, soft and nontender. No organomegaly or masses felt. Normal bowel sounds heard. Central nervous system: Alert and oriented. No focal neurological deficits. Extremities: Symmetric 5 x 5 power. Skin: No rashes, lesions or ulcers.  Psychiatry: Judgement and insight appear normal. Mood & affect appropriate.   Objective: Vitals:   07/21/21 0503 07/21/21 0733 07/21/21 0735 07/21/21 0845  BP: 118/77 132/79  124/86  Pulse: 72 81  88  Resp: 14 (!) 22  (!) 25  Temp: 97.6 F (36.4 C) (!) 97.5 F (36.4 C)    TempSrc: Oral Oral    SpO2: 94% 94% 96% 92%  Weight:      Height:        Intake/Output Summary (Last 24 hours) at 07/21/2021 1043 Last data filed at 07/21/2021 0837 Gross per 24 hour  Intake 640 ml  Output 1800 ml  Net -1160 ml    Filed Weights   07/19/21 0035 07/20/21 0003 07/21/21 0012  Weight: 71.1 kg 70.7 kg 70.4 kg     Data Reviewed:   CBC: Recent Labs  Lab 07/15/21 0245 07/17/21 0955 07/21/21 0325  WBC 13.8* 17.2* 17.2*  NEUTROABS  --  16.0*  --   HGB 11.6* 13.6 12.9*  HCT 34.2* 41.1 40.2  MCV 96.9 98.1 98.5  PLT 276 297 532    Basic Metabolic Panel: Recent Labs  Lab 07/15/21 0245 07/17/21 0955 07/21/21 0325  NA 140 140 138  K 4.0 4.1 3.9  CL 104 101 98  CO2 32 34* 35*  GLUCOSE 146* 101* 125*  BUN 23 27* 15  CREATININE 0.82 0.77 0.83  CALCIUM 8.0* 8.5* 8.1*  MG  --  2.4 2.0    GFR: Estimated Creatinine Clearance: 81.3 mL/min (by C-G formula based on SCr of 0.83 mg/dL). Liver Function Tests: No results for input(s): AST, ALT, ALKPHOS, BILITOT, PROT, ALBUMIN in the last 168 hours. No results for input(s): LIPASE, AMYLASE in the last 168  hours. No results for input(s): AMMONIA in the last 168 hours. Coagulation Profile: No results for input(s): INR, PROTIME in the last 168 hours. Cardiac Enzymes: No results for input(s): CKTOTAL, CKMB, CKMBINDEX, TROPONINI in the last 168 hours. BNP (last 3 results) Recent Labs    05/29/21 1045 06/25/21 1439  PROBNP 926.0* 44.0    HbA1C: No results for input(s): HGBA1C in the last 72 hours. CBG: No results for input(s): GLUCAP in the last 168 hours. Lipid Profile: No results for input(s): CHOL, HDL, LDLCALC, TRIG, CHOLHDL, LDLDIRECT in the last 72 hours. Thyroid Function Tests: No results for input(s): TSH, T4TOTAL, FREET4, T3FREE, THYROIDAB in the last 72 hours. Anemia Panel: No results for input(s): VITAMINB12, FOLATE, FERRITIN, TIBC, IRON, RETICCTPCT in the last 72 hours.  Sepsis Labs: No results for input(s): PROCALCITON, LATICACIDVEN in the last 168 hours.   No results found for this or any previous visit (from the past 240 hour(s)).         Radiology Studies: No results found.      Scheduled Meds:  amLODipine  5 mg Oral Daily   apixaban  5 mg Oral BID   clonazePAM  1 mg Oral BID   diltiazem  120 mg Oral q AM   docusate sodium  100 mg Oral BID   gabapentin  600 mg Oral BID   mouth rinse  15 mL Mouth Rinse BID    morphine injection  1-2 mg Intravenous Q4H   pantoprazole  40 mg Oral QHS   predniSONE  40 mg Oral Q breakfast   psyllium  1 packet Oral QHS   sodium chloride flush  3 mL Intravenous Q12H   Continuous Infusions:  sodium chloride Stopped (07/14/21 0610)     LOS: 13 days    Darliss Cheney, MD [facility]  If 7PM-7AM, please contact night-coverage  07/21/2021, 10:43 AM

## 2021-07-22 DIAGNOSIS — F4001 Agoraphobia with panic disorder: Secondary | ICD-10-CM | POA: Diagnosis not present

## 2021-07-22 DIAGNOSIS — J84112 Idiopathic pulmonary fibrosis: Secondary | ICD-10-CM | POA: Diagnosis not present

## 2021-07-22 DIAGNOSIS — F339 Major depressive disorder, recurrent, unspecified: Secondary | ICD-10-CM

## 2021-07-22 DIAGNOSIS — J9611 Chronic respiratory failure with hypoxia: Secondary | ICD-10-CM | POA: Diagnosis not present

## 2021-07-22 DIAGNOSIS — J188 Other pneumonia, unspecified organism: Secondary | ICD-10-CM

## 2021-07-22 DIAGNOSIS — R0600 Dyspnea, unspecified: Secondary | ICD-10-CM | POA: Diagnosis not present

## 2021-07-22 DIAGNOSIS — I4581 Long QT syndrome: Secondary | ICD-10-CM

## 2021-07-22 NOTE — Plan of Care (Signed)
°  Problem: Respiratory: °Goal: Ability to maintain adequate ventilation will improve °Outcome: Progressing °  °

## 2021-07-22 NOTE — Progress Notes (Signed)
DetailedSedated PROGRESS NOTE    Eduardo Fuentes  XBM:[id number] DOB: 1950-05-15 DOA: 06/25/2021 PCP: Lujean Amel, MD   Brief Narrative:  71 year old male with HTN, pulmonary fibrosis on 8 L with activities at home, prostate cancer, comes into the hospital with shortness of breath.  He has been having IPF for a number of years, but over the last several months he became worse, and started on oxygen about 3 months ago.  He has been on 2 to 3 L, but when seen by pulmonology last Friday he was decreased to 8 L with exertion.  He continued to progress getting worse at home and eventually came to the hospital.  Sats were at 60% despite nasal cannula.  Pulmonary team and palliative care has been consulted.   Assessment & Plan:  Principal Problem:   Chronic respiratory failure with hypoxia (HCC) Active Problems:   Major depressive disorder, recurrent episode (HCC)   Panic disorder with agoraphobia   IPF (idiopathic pulmonary fibrosis) (HCC)   Paroxysmal atrial fibrillation (HCC) CHA2VASC2 = 3; on Eliiquis   Prolonged QT interval   Community acquired pneumonia     Acute on chronic respiratory failure with hypoxia Pulmonary fibrosis flare -Likely secondary to progression of IPF with superimposed pneumonia.  There is low suspicion for pulmonary embolism.   After his flare in April he was noted to require 2 L of oxygen at rest and up to 8 L on exertion.  -Seen by pulmonary, due to high amount of oxygen requirement, he was started on high-dose of Solu-Medrol which he received for 4 to 5 days and has been transitioned to oral prednisone on 07/17/2021 with the plan to taper 10 mg every 10 days per PCCM.  Completed 7 days of antibiotics on 07/15/2021.  Per reports, patient fell out of the bed overnight, no injury reported but he was hypoxic surrounding those events and he was escalated from 10 L nasal clear to 80% high flow nasal cannula and 40 L/min.  He is fully alert and oriented at this point in time.   Per PCCM "May have to tolerate periods of desaturation into the low 80s or even the high 70s. He will desaturate easily with activity and take a while to recover, which is typical of end stage IPF".  They also had a long discussion with the patient and patient understands the prognosis here is quite poor and that all therapeutic interventions have been tried and have seemingly failed.  They also introduced the concept of hospice.  Patient was seen again by palliative care on 07/17/2021 and he expressed his desire to go home with hospice but unfortunately, he is on very high amount of oxygen which cannot be supported/provided by hospice at home.  Per reports, maximum they can support his 15 L and maximum amount of oxygen that [facility] cannot support his 20 L.  Finally on 07/21/2021, with palliative care he decided that he would like to stay in the hospital for his end-of-life care however he is waiting for his 2 nieces [place] from [place] and his sister as well as pastor, he tells me that they will be here tomorrow and likely he would transition to comfort care tomorrow.  Overnight, he desaturated and currently on 25 L of oxygen.   Prolonged QTc -Continue to monitor.  Avoid QTc prolonging medications.  Keep electrolytes within normal range.  Essential hypertension - Blood pressure now controlled.  Continue Cardizem 120 mg daily, add amlodipine 5 mg p.o. daily  today.   Hyperlipidemia -Continue Crestor   Depression/anxiety -Klonopin 1 mg twice daily   Paroxysmal A-fib  - Rates controlled on Cardizem and Eliquis  Diarrhea: Improved on Imodium.   DVT prophylaxis: Eliquis Code Status: No CPR Family Communication: None at bedside.  Status is: Inpatient Remains inpatient appropriate because: Patient too unstable to be discharged to home or [facility] due to high amount of oxygen requirement.  Plan to transition to comfort care care once family arrives.  Subjective:  Seen and examined.   Breathing stable.  No new complaint.  He is emotional and tearful.  Examination:  General exam: Appears emotional and slightly dyspneic. Respiratory system: Coarse breath sounds and rhonchi with mild dyspnea and tachypnea. Cardiovascular system: S1 & S2 heard, RRR. No JVD, murmurs, rubs, gallops or clicks. No pedal edema. Gastrointestinal system: Abdomen is nondistended, soft and nontender. No organomegaly or masses felt. Normal bowel sounds heard. Central nervous system: Alert and oriented. No focal neurological deficits. Extremities: Symmetric 5 x 5 power. Skin: No rashes, lesions or ulcers.  Psychiatry: Judgement and insight appear normal. Mood & affect appropriate.   Objective: Vitals:   07/22/21 0806 07/22/21 0828 07/22/21 0836 07/22/21 0845  BP:      Pulse: 92 90 86 78  Resp: (!) 24 (!) 23 (!) 24 (!) 23  Temp:      TempSrc:      SpO2: 90% 90% 92% 96%  Weight:      Height:        Intake/Output Summary (Last 24 hours) at 07/22/2021 1035 Last data filed at 07/22/2021 0836 Gross per 24 hour  Intake 766 ml  Output 2600 ml  Net -1834 ml    Filed Weights   07/20/21 0003 07/21/21 0012 07/22/21 0001  Weight: 70.7 kg 70.4 kg 69 kg     Data Reviewed:   CBC: Recent Labs  Lab 07/17/21 0955 07/21/21 0325  WBC 17.2* 17.2*  NEUTROABS 16.0*  --   HGB 13.6 12.9*  HCT 41.1 40.2  MCV 98.1 98.5  PLT 297 175    Basic Metabolic Panel: Recent Labs  Lab 07/17/21 0955 07/21/21 0325  NA 140 138  K 4.1 3.9  CL 101 98  CO2 34* 35*  GLUCOSE 101* 125*  BUN 27* 15  CREATININE 0.77 0.83  CALCIUM 8.5* 8.1*  MG 2.4 2.0    GFR: Estimated Creatinine Clearance: 79.7 mL/min (by C-G formula based on SCr of 0.83 mg/dL). Liver Function Tests: No results for input(s): AST, ALT, ALKPHOS, BILITOT, PROT, ALBUMIN in the last 168 hours. No results for input(s): LIPASE, AMYLASE in the last 168 hours. No results for input(s): AMMONIA in the last 168 hours. Coagulation Profile: No  results for input(s): INR, PROTIME in the last 168 hours. Cardiac Enzymes: No results for input(s): CKTOTAL, CKMB, CKMBINDEX, TROPONINI in the last 168 hours. BNP (last 3 results) Recent Labs    05/29/21 1045 06/25/21 1439  PROBNP 926.0* 44.0    HbA1C: No results for input(s): HGBA1C in the last 72 hours. CBG: No results for input(s): GLUCAP in the last 168 hours. Lipid Profile: No results for input(s): CHOL, HDL, LDLCALC, TRIG, CHOLHDL, LDLDIRECT in the last 72 hours. Thyroid Function Tests: No results for input(s): TSH, T4TOTAL, FREET4, T3FREE, THYROIDAB in the last 72 hours. Anemia Panel: No results for input(s): VITAMINB12, FOLATE, FERRITIN, TIBC, IRON, RETICCTPCT in the last 72 hours. Sepsis Labs: No results for input(s): PROCALCITON, LATICACIDVEN in the last 168 hours.   No results  found for this or any previous visit (from the past 240 hour(s)).         Radiology Studies: No results found.      Scheduled Meds:  amLODipine  5 mg Oral Daily   apixaban  5 mg Oral BID   clonazePAM  1 mg Oral BID   diltiazem  120 mg Oral q AM   docusate sodium  100 mg Oral BID   gabapentin  600 mg Oral BID   mouth rinse  15 mL Mouth Rinse BID    morphine injection  1-2 mg Intravenous Q4H   pantoprazole  40 mg Oral QHS   predniSONE  40 mg Oral Q breakfast   psyllium  1 packet Oral QHS   sodium chloride flush  3 mL Intravenous Q12H   Continuous Infusions:  sodium chloride Stopped (07/14/21 0610)     LOS: 14 days    Darliss Cheney, MD [facility]  If 7PM-7AM, please contact night-coverage  07/22/2021, 10:35 AM

## 2021-07-22 NOTE — Progress Notes (Signed)
Palliative Medicine Progress Note   Patient Name: Eduardo Fuentes       Date: 07/22/2021 DOB: [birth year]  Age: 71 y.o. MRN#: [id number] Attending Physician: Darliss Cheney, MD Primary Care Physician: Lujean Amel, MD Admit Date: 07/04/2021  Reason for Consultation/Follow-up: {Reason for Consult:23484}  HPI/Patient Profile: 71 y.o. male  with past medical history of IPF, atrial fibrillation, prostate cancer, coronary artery calcification, sliding hiatal hernia, generalized anxiety with panic disorder and agoraphobia, GERD, and hyperlipidemia.  He presented to the emergency department on 07/05/2021 with worsening dyspnea.  He had been seen in the pulmonary office 5/12 and recommended to increase O2 to 8L on exertion.  Later that evening he had acute onset of worsening dyspnea which became progressively worse over the next 2 days. He had not increased his oxygen to 8 L as recommended due to the lack of the concentrator in his home. On arrival to the ED, his O2 sats were in the 42s.  He quickly improved to the upper 90s with nonrebreather. Chest x-ray showed increased opacification in the right lung suspicious for pneumonia.   He is admitted to [facility] with pneumonia, acute on chronic hypoxic respiratory failure, and acute on chronic CHF.   5/24 - Per Pulmonary, patient has not had meaningful improvement despite high-dose IV Solu-Medrol for 5 days.  Poor prognosis was discussed and hospice was introduced.   Patient initially was hopeful to go home with hospice, but unfortunately his oxygen requirements have been too high to make this a viable option.    Subjective: ***  Objective:  Physical Exam Vitals reviewed.  Constitutional:      General: He is not in acute distress.    Appearance: He is  ill-appearing.  Neurological:     Mental Status: He is alert.            Vital Signs: BP 138/79 (BP Location: Right Arm)   Pulse 86   Temp 97.6 F (36.4 C) (Oral)   Resp (!) 24   Ht '5\' 9"'$  (1.753 m)   Wt 69 kg   SpO2 92%   BMI 22.46 kg/m  SpO2: SpO2: 92 % O2 Device: O2 Device: High Flow Nasal Cannula O2 Flow Rate: O2 Flow Rate (L/min): 25 L/min   LBM: Last BM Date : 07/19/21     Palliative Assessment/Data: ***  Palliative Medicine Assessment & Plan   Assessment: Principal Problem:   Chronic respiratory failure with hypoxia (HCC) Active Problems:   Major depressive disorder, recurrent episode (HCC)   Panic disorder with agoraphobia   IPF (idiopathic pulmonary fibrosis) (HCC)   Paroxysmal atrial fibrillation (HCC) CHA2VASC2 = 3; on Eliiquis   Prolonged QT interval   Community acquired pneumonia    Recommendations/Plan: ***  Goals of Care and Additional Recommendations: Limitations on Scope of Treatment: {Recommended Scope and Preferences:21019}  Code Status:   Prognosis:  {Palliative Care Prognosis:23504}  Discharge Planning: {Palliative dispostion:23505}  Care plan was discussed with ***  Thank you for allowing the Palliative Medicine Team to assist in the care of this patient.   ***   Lavena Bullion, NP   Please contact Palliative Medicine Team phone at [phone] for questions and concerns.  For individual providers, please see AMION.

## 2021-07-23 DIAGNOSIS — J9611 Chronic respiratory failure with hypoxia: Secondary | ICD-10-CM | POA: Diagnosis not present

## 2021-07-23 DIAGNOSIS — F4001 Agoraphobia with panic disorder: Secondary | ICD-10-CM | POA: Diagnosis not present

## 2021-07-23 DIAGNOSIS — J84112 Idiopathic pulmonary fibrosis: Secondary | ICD-10-CM | POA: Diagnosis not present

## 2021-07-23 DIAGNOSIS — R0602 Shortness of breath: Secondary | ICD-10-CM | POA: Diagnosis not present

## 2021-07-23 NOTE — Progress Notes (Addendum)
   NAME:  Eduardo Fuentes, MRN:  [id number], DOB:  [birth year], LOS: 59 ADMISSION DATE:  07/12/2021, CONSULTATION DATE:  07/12/2021 REFERRING MD:  Masters, CHIEF COMPLAINT:  Hypoxemia, IPF, Pneumonia, Heart Failure   History of Present Illness:  71 year old male, never smoker with history of IPF on Ofev as an outpatient with recent admission last month for IPF exacerbation, elevated troponins.  He had a cardiac cath during that admission which showed nonobstructive coronary artery disease.  Now readmitted 5/15 with worsening dyspnea, hypoxia to the 50s.  Chest x-ray showed new right lung infiltrates.  After his flare in April he was noted to require 2 L of oxygen at rest and up to 8 L on exertion   Pertinent  Medical History   PMH significant for coronary artery calcification, afib, IPF, prostate CA, hyperlipidemia, GERD with sliding hiatal hernia, GAD, panic disorder with agoraphobia.  Cardiac Cath 05/30/2021 The left ventricular systolic function is normal.   LV end diastolic pressure is normal.   The left ventricular ejection fraction is 50-55% by visual estimate.   Hemodynamic findings consistent with mild pulmonary hypertension.   There is no aortic valve stenosis.   Mild, diffuse nonobstructive coronary artery disease.   Aortic saturation 94%, PA saturation 65%, PA pressure 55/21, mean PA pressure 34 mmHg, pulmonary capillary wedge pressure 9 mmHg, cardiac output 4.75 L/min, cardiac index 2.5.   Significant Hospital Events: Including procedures, antibiotic start and stop dates in addition to other pertinent events   Admitted 05/29/2021 for IPF/ CHF flare Admission 07/04/2021 with Pneumonia, CHF, vanco + cefepime initiated 5/24 Confused, fell overnight w/o injury. Hypoxic surrounding events > O2 increased from 10L to HFNC 80%/40LPM.    Subjective:  Afebrile  Still with significant FiO2 requirement  Objective   Blood pressure 126/84, pulse 94, temperature 99.4 F (37.4 C), temperature  source Oral, resp. rate (!) 24, height '5\' 9"'$  (1.753 m), weight 70 kg, SpO2 94 %.    FiO2 (%):  [100 %] 100 %   Intake/Output Summary (Last 24 hours) at 07/23/2021 1618 Last data filed at 07/23/2021 1613 Gross per 24 hour  Intake 598 ml  Output 2000 ml  Net -1402 ml   Filed Weights   07/21/21 0012 07/22/21 0001 07/23/21 0019  Weight: 70.4 kg 69 kg 70 kg    Examination: General: Elderly, does not appear to be in distress PSY: intermittently tearful  HEENT: Moist oral mucosa Neuro: Alert and oriented x3 CV: S1-S2 appreciated PULM: Bilateral crackles Extremities: warm/dry, no edema  Skin: no rashes or lesions  Resolved Hospital Problem list     Assessment & Plan:   Acute on chronic hypoxemic respiratory failure Idiopathic pulmonary fibrosis Possible IPF exacerbation -Recently completed antibiotics on 5/22 -Not fluid overloaded -Still with significant high FiO2 requirement -Completing high-dose steroids -To be weaned slowly -Continue nintedanib -Continue bronchodilators-Xopenex -Morphine for increased work of breathing He is DNR  Potential discharge to [facility] will continue to follow intermittently.     Sherrilyn Rist, MD [facility] Pager: See Shea Evans

## 2021-07-23 NOTE — Progress Notes (Signed)
DetailedSedated PROGRESS NOTE    Eduardo Fuentes  KNL:[id number] DOB: 05/22/1950 DOA: 07/17/2021 PCP: Lujean Amel, MD   Brief Narrative:  71 year old male with HTN, pulmonary fibrosis on 8 L with activities at home, prostate cancer, comes into the hospital with shortness of breath.  He has been having IPF for a number of years, but over the last several months he became worse, and started on oxygen about 3 months ago.  He has been on 2 to 3 L, but when seen by pulmonology last Friday he was decreased to 8 L with exertion.  He continued to progress getting worse at home and eventually came to the hospital.  Sats were at 60% despite nasal cannula.  Pulmonary team and palliative care has been consulted.   Assessment & Plan:  Principal Problem:   Chronic respiratory failure with hypoxia (HCC) Active Problems:   Major depressive disorder, recurrent episode (HCC)   Panic disorder with agoraphobia   IPF (idiopathic pulmonary fibrosis) (HCC)   Paroxysmal atrial fibrillation (HCC) CHA2VASC2 = 3; on Eliiquis   Prolonged QT interval   Community acquired pneumonia     Acute on chronic respiratory failure with hypoxia Pulmonary fibrosis flare -Likely secondary to progression of IPF with superimposed pneumonia.   After his flare in April he was noted to require 2 L of oxygen at rest and up to 8 L on exertion.  -Seen by pulmonary, due to high amount of oxygen requirement, he was started on high-dose of Solu-Medrol which he received for 4 to 5 days and has been transitioned to oral prednisone on 07/17/2021 with the plan to taper 10 mg every 10 days per PCCM.  Completed 7 days of antibiotics on 07/15/2021.  Few nights ago, patient fell out of the bed overnight, no injury reported but he was hypoxic surrounding those events and he was escalated from 10 L nasal clear to 80% high flow nasal cannula and 40 L/min.  Per PCCM "May have to tolerate periods of desaturation into the low 80s or even the high 70s. He  will desaturate easily with activity and take a while to recover, which is typical of end stage IPF".  They also had a long discussion with the patient and patient understands the prognosis here is quite poor and that all therapeutic interventions have been tried and have seemingly failed.  He initially desired to go home but he understands that his high amount of oxygen cannot be supported at home and neither did be complaints.  Palliative care was involved and after having several conversations with them and myself, patient has finally decided to transition to comfort care in the hospital and pass however patient had initially requested to give him a couple of days and that he is nieces, and sister will be available today on 07/23/2021 and he will likely transition to comfort care however today he says that he would transition to comfort care likely on Friday.  His wife and his niece were at the bedside as well as his pastor.   Prolonged QTc -Continue to monitor.  Avoid QTc prolonging medications.  Keep electrolytes within normal range.  Essential hypertension - Blood pressure now controlled.  Continue Cardizem 120 mg daily, add amlodipine 5 mg p.o. daily today.   Hyperlipidemia -Continue Crestor   Depression/anxiety -Klonopin 1 mg twice daily   Paroxysmal A-fib  - Rates controlled on Cardizem and Eliquis  Diarrhea: Improved on Imodium.   DVT prophylaxis: Eliquis Code Status: No CPR Family Communication: None  at bedside.  Status is: Inpatient Remains inpatient appropriate because: Patient too unstable to be discharged to home or [facility] due to high amount of oxygen requirement.  Plan to transition to comfort care care when patient is ready.  Subjective:  Seen and examined.  Family at the bedside.  Patient has no new complaint.  Remains a stable with baseline tachypnea and dyspnea.  Examination:  General exam: Appears dyspneic Respiratory system: Bilateral rhonchi and coarse  breath sounds with dyspnea and tachypnea. Cardiovascular system: S1 & S2 heard, RRR. No JVD, murmurs, rubs, gallops or clicks. No pedal edema. Gastrointestinal system: Abdomen is nondistended, soft and nontender. No organomegaly or masses felt. Normal bowel sounds heard. Central nervous system: Alert and oriented. No focal neurological deficits. Extremities: Symmetric 5 x 5 power. Skin: No rashes, lesions or ulcers.  Psychiatry: Judgement and insight appear normal. Mood & affect appropriate.   Objective: Vitals:   07/23/21 1123 07/23/21 1127 07/23/21 1128 07/23/21 1133  BP: 120/81     Pulse: (!) 110 94 89 89  Resp: (!) 30 (!) 34 (!) 32 (!) 24  Temp: 99.3 F (37.4 C)     TempSrc: Oral     SpO2: (!) 75% (!) 85% 91% 99%  Weight:      Height:        Intake/Output Summary (Last 24 hours) at 07/23/2021 1133 Last data filed at 07/23/2021 0759 Gross per 24 hour  Intake 480 ml  Output 1600 ml  Net -1120 ml    Filed Weights   07/21/21 0012 07/22/21 0001 07/23/21 0019  Weight: 70.4 kg 69 kg 70 kg     Data Reviewed:   CBC: Recent Labs  Lab 07/17/21 0955 07/21/21 0325  WBC 17.2* 17.2*  NEUTROABS 16.0*  --   HGB 13.6 12.9*  HCT 41.1 40.2  MCV 98.1 98.5  PLT 297 505    Basic Metabolic Panel: Recent Labs  Lab 07/17/21 0955 07/21/21 0325  NA 140 138  K 4.1 3.9  CL 101 98  CO2 34* 35*  GLUCOSE 101* 125*  BUN 27* 15  CREATININE 0.77 0.83  CALCIUM 8.5* 8.1*  MG 2.4 2.0    GFR: Estimated Creatinine Clearance: 80.8 mL/min (by C-G formula based on SCr of 0.83 mg/dL). Liver Function Tests: No results for input(s): AST, ALT, ALKPHOS, BILITOT, PROT, ALBUMIN in the last 168 hours. No results for input(s): LIPASE, AMYLASE in the last 168 hours. No results for input(s): AMMONIA in the last 168 hours. Coagulation Profile: No results for input(s): INR, PROTIME in the last 168 hours. Cardiac Enzymes: No results for input(s): CKTOTAL, CKMB, CKMBINDEX, TROPONINI in the last  168 hours. BNP (last 3 results) Recent Labs    05/29/21 1045 06/25/21 1439  PROBNP 926.0* 44.0    HbA1C: No results for input(s): HGBA1C in the last 72 hours. CBG: No results for input(s): GLUCAP in the last 168 hours. Lipid Profile: No results for input(s): CHOL, HDL, LDLCALC, TRIG, CHOLHDL, LDLDIRECT in the last 72 hours. Thyroid Function Tests: No results for input(s): TSH, T4TOTAL, FREET4, T3FREE, THYROIDAB in the last 72 hours. Anemia Panel: No results for input(s): VITAMINB12, FOLATE, FERRITIN, TIBC, IRON, RETICCTPCT in the last 72 hours. Sepsis Labs: No results for input(s): PROCALCITON, LATICACIDVEN in the last 168 hours.   No results found for this or any previous visit (from the past 240 hour(s)).    Radiology Studies: No results found.  Scheduled Meds:  amLODipine  5 mg Oral Daily  apixaban  5 mg Oral BID   clonazePAM  1 mg Oral BID   diltiazem  120 mg Oral q AM   docusate sodium  100 mg Oral BID   gabapentin  600 mg Oral BID   mouth rinse  15 mL Mouth Rinse BID    morphine injection  1-2 mg Intravenous Q4H   pantoprazole  40 mg Oral QHS   predniSONE  40 mg Oral Q breakfast   psyllium  1 packet Oral QHS   sodium chloride flush  3 mL Intravenous Q12H   Continuous Infusions:  sodium chloride Stopped (07/14/21 0610)     LOS: 15 days    Darliss Cheney, MD [facility]  If 7PM-7AM, please contact night-coverage  07/23/2021, 11:33 AM

## 2021-07-23 NOTE — Plan of Care (Signed)
  Problem: Respiratory: Goal: Ability to maintain adequate ventilation will improve Outcome: Not Progressing  Increased O2 demands

## 2021-07-24 DIAGNOSIS — J9611 Chronic respiratory failure with hypoxia: Secondary | ICD-10-CM | POA: Diagnosis not present

## 2021-07-24 DIAGNOSIS — Z7189 Other specified counseling: Secondary | ICD-10-CM | POA: Diagnosis not present

## 2021-07-24 DIAGNOSIS — Z515 Encounter for palliative care: Secondary | ICD-10-CM | POA: Diagnosis not present

## 2021-07-24 MED ORDER — SCOPOLAMINE 1 MG/3DAYS TD PT72
1.0000 | MEDICATED_PATCH | TRANSDERMAL | Status: DC
  Administered 2021-07-24: 1.5 mg via TRANSDERMAL
  Filled 2021-07-24: qty 1

## 2021-07-24 MED ORDER — LORAZEPAM 2 MG/ML IJ SOLN: 0.5000 mg | INTRAMUSCULAR | Status: DC | PRN

## 2021-07-24 MED ORDER — GLYCOPYRROLATE 1 MG PO TABS: 1.0000 mg | ORAL_TABLET | ORAL | Status: DC | PRN

## 2021-07-24 MED ORDER — GLYCOPYRROLATE 0.2 MG/ML IJ SOLN: 0.2000 mg | INTRAMUSCULAR | Status: DC | PRN

## 2021-07-24 MED ORDER — MORPHINE SULFATE (PF) 2 MG/ML IV SOLN: 1.0000 mg | INTRAVENOUS | Status: DC | PRN

## 2021-07-24 MED ORDER — MORPHINE SULFATE (PF) 4 MG/ML IV SOLN
4.0000 mg | Freq: Once | INTRAVENOUS | Status: AC
  Administered 2021-07-24: 4 mg via INTRAVENOUS

## 2021-07-24 MED ORDER — LORAZEPAM 2 MG/ML IJ SOLN: 1.0000 mg | INTRAMUSCULAR | Status: DC | PRN

## 2021-07-24 MED ORDER — LORAZEPAM 1 MG PO TABS: 1.0000 mg | ORAL_TABLET | ORAL | Status: DC | PRN

## 2021-07-24 MED ORDER — MORPHINE 100MG IN NS 100ML (1MG/ML) PREMIX INFUSION
4.0000 mg/h | INTRAVENOUS | Status: DC
  Filled 2021-07-24: qty 100

## 2021-07-24 MED ORDER — LORAZEPAM 2 MG/ML PO CONC: 1.0000 mg | ORAL | Status: DC | PRN

## 2021-07-24 MED ORDER — MORPHINE SULFATE (PF) 2 MG/ML IV SOLN
1.0000 mg | INTRAVENOUS | Status: DC | PRN
  Filled 2021-07-24: qty 1

## 2021-07-25 NOTE — Care Management Important Message (Signed)
Important Message  Patient Details  Name: Eduardo Fuentes MRN: [id number] Date of Birth: [birth year]   Medicare Important Message Given:  Yes     Shelda Altes 2021/08/23, 8:57 AM

## 2021-07-25 NOTE — Progress Notes (Signed)
   Palliative Medicine Inpatient Follow Up Note  HPI: 71 y.o. male  with past medical history of IPF, atrial fibrillation, prostate cancer, coronary artery calcification, sliding hiatal hernia, generalized anxiety with panic disorder and agoraphobia, GERD, and hyperlipidemia.  He presented to the emergency department on 07/23/2021 with worsening dyspnea.  He had been seen in the pulmonary office 5/12 and recommended to increase O2 to 8L on exertion.  Later that evening he had acute onset of worsening dyspnea which became progressively worse over the next 2 days. He had not increased his oxygen to 8 L as recommended due to the lack of the concentrator in his home.  Today's Discussion 08/08/2021  *Please note that this is a verbal dictation therefore any spelling or grammatical errors are due to the "[employer]" system interpretation.  Chart reviewed inclusive of vital signs, progress notes, laboratory results, and diagnostic images.   Asked to get involved by Damian Leavell, RN for the Palliative care team as patient hypoxic to the 50's.   We went to bedside and introduced ourselves. Patient appeared to be in the active phases of dying.  Patient severely distressed upon assessment with notable tachypnea and abdominal breathing.    Asked for '4mg'$  of morphine and '2mg'$  of lorazepam.   Once morphine was given waiting and weaned off of supplemental NRB FM and HFNC to standard [place].   Patient passed away shortly thereafter with family at bedside.  Patients wife experienced chest pain episode afterwards - VSS.   Family encouraged to spend time with patient after his passing.   Palliative Support Provided  Billing based on MDM: High  Problems Addressed: One acute or chronic illness or injury that poses a threat to life or bodily function  Amount and/or Complexity of Data: Category 3:Discussion of management or test interpretation with external physician/other qualified health care  professional/appropriate source (not separately reported)  Risks: Decision not to resuscitate or to de-escalate care because of poor prognosis ______________________________________________________________________________________ [place] Team Team Cell Phone: [phone] Please utilize secure chat with additional questions, if there is no response within 30 minutes please call the above phone number  Palliative Medicine Team providers are available by phone from 7am to 7pm daily and can be reached through the team cell phone.  Should this patient require assistance outside of these hours, please call the patient's attending physician.

## 2021-07-25 NOTE — Progress Notes (Signed)
Home meds return to wife and family at the bedside.

## 2021-07-25 NOTE — Progress Notes (Signed)
CCMD called RN to notify asystole rhythm. Patient made comfort care this early afternoon and is DNR code status. Wife and family at the bedside. Palliative team at the bedside provided emotional support to the family. MD notified of patient passing at 1433. Sharmon Leyden RN and Museum/gallery exhibitions officer pronounced death. RN provided emotional support to family at bedside. Comfort tray at bedside.

## 2021-07-25 NOTE — Progress Notes (Addendum)
RT called to pts room for desaturation into 30s. Upon arrival RN was holding NRB mask to pts face and he was in the low to mid 80's. RT checked all connections on HHFNC and NRB. Equipment was working appropriately however patient had taken NRB off and in process had apparently moved [place] out of nose. Repositioned nasal cannula in nose and adjusted NRB mask on pts face. Turned flow up to 30L at 100%. Upon leaving RN at bedside and patient's O2 sats were 90-94%.

## 2021-07-25 NOTE — Progress Notes (Signed)
PROGRESS NOTE  Eduardo Fuentes  DOB: 11/09/1950  PCP: Lujean Amel, MD YBO:[id number]  DOA: 07/07/2021  LOS: 30 days  Hospital Day: 17  Brief narrative: Eduardo Fuentes is a 70 y.o. male with PMH significant for HTN, pulmonary fibrosis on 8 L with activities at home, prostate cancer. Patient presented to the ED on 5/15 with complaint of shortness of breath. Patient has IPF for a number of years, but over the last several months he became worse, and was started on oxygen about 3 months ago.  His oxygen demand continued to increase and was recently increased to 8 L/min despite which he continued to be short of breath and hence presented to the hospital on 5/15. Chest x-ray on admission showed increased opacification of the right lung which may reflect pneumonia, asymmetric edema or exacerbation of interstitial lung disease. He was hospitalized and was placed on [employer] managed cannula.  His oxygenation continues to worsen.  Pulmonary and palliative care consultation were obtained. See below for details  Subjective: Patient was seen and examined this morning. I was called by the nurses this morning because patient was getting short of breath and hypoxic even on 30 L oxygen by nasal cannula.  By the time I saw the patient, he was bumped up to 35 L/min.  He was alert, awake, oriented x3, tachycardic, tachypneic, visibly dyspneic.  One of his nieces came to visit him.  Patient has been offered comfort care multiple times by multiple providers.  He says he wants to make a decision by tomorrow.  Principal Problem:   Chronic respiratory failure with hypoxia (HCC) Active Problems:   Major depressive disorder, recurrent episode (HCC)   Panic disorder with agoraphobia   IPF (idiopathic pulmonary fibrosis) (HCC)   Paroxysmal atrial fibrillation (HCC) CHA2VASC2 = 3; on Eliiquis   Prolonged QT interval   Community acquired pneumonia   Assessment and Plan: Acute on chronic respiratory failure with  hypoxia Acute worsening of pulmonary fibrosis. -Seen by pulmonary.  Started on high-dose of Solu-Medrol and subsequently transitioned to oral prednisone.  He also completed 7-day course of empiric antibiotics. -Patient's oxygenation has not improved and required up to 40 L/min.  He was on 30 L/min last night and is up again to 35 L this morning.  His oxygen saturation worsens with minimal exertion. -Multiple providers including palliative care [place] suggested comfort care.  I reinforced the idea of comfort care this morning.  Patient wants to wait till tomorrow to make a decision.  I believe he is waiting for all of his family members to be available.  Prolonged QTc -Continue to monitor.  Avoid QTc prolonging medications.  Keep electrolytes within normal range.  Essential hypertension -Continue Cardizem and amlodipine.    Hyperlipidemia -Continue Crestor   Depression/anxiety -Klonopin 1 mg twice daily   Paroxysmal A-fib  -Rates controlled on Cardizem and Eliquis   Diarrhea -Improved on Imodium.   Goals of care   Code Status: DNR    Mobility: Poor prognosis  Skin assessment:     Nutritional status:  Body mass index is 23.18 kg/m.          Diet:  Diet Order             DIET DYS 3 Room service appropriate? Yes; Fluid consistency: Thin  Diet effective now                   DVT prophylaxis: Eliquis  apixaban (ELIQUIS) tablet 5 mg   Antimicrobials:  Completed the course of antibiotics Fluid: None Consultants: PCCM Family Communication: One of his niece at bedside  Status is: Inpatient  Continue in-hospital care because: Continues to have worsening respiratory failure.  Poor prognosis Level of care: Progressive   Dispo: The patient is from: Home              Anticipated d/c is to: Once he is transition to comfort care, with low-flow oxygen, in-hospital mortality most likely              Patient currently is not medically stable to d/c.   Difficult  to place patient No     Infusions:   sodium chloride Stopped (07/14/21 0610)    Scheduled Meds:  amLODipine  5 mg Oral Daily   apixaban  5 mg Oral BID   clonazePAM  1 mg Oral BID   diltiazem  120 mg Oral q AM   docusate sodium  100 mg Oral BID   gabapentin  600 mg Oral BID   mouth rinse  15 mL Mouth Rinse BID    morphine injection  1-2 mg Intravenous Q4H   pantoprazole  40 mg Oral QHS   predniSONE  40 mg Oral Q breakfast   psyllium  1 packet Oral QHS   sodium chloride flush  3 mL Intravenous Q12H    PRN meds: sodium chloride, acetaminophen **OR** acetaminophen, bisacodyl, guaiFENesin, hydrALAZINE, levalbuterol, loperamide, LORazepam, morphine injection, polyethylene glycol   Antimicrobials: Anti-infectives (From admission, onward)    Start     Dose/Rate Route Frequency Ordered Stop   07/10/21 1100  ceFEPIme (MAXIPIME) 2 g in sodium chloride 0.9 % 100 mL IVPB  Status:  Discontinued        2 g 200 mL/hr over 30 Minutes Intravenous Every 8 hours 07/10/21 0841 07/15/21 1237   07/10/21 1000  ceFEPIme (MAXIPIME) 2 g in sodium chloride 0.9 % 100 mL IVPB  Status:  Discontinued        2 g 200 mL/hr over 30 Minutes Intravenous Every 12 hours 07/10/21 0838 07/10/21 0841   07/09/21 1100  ceFEPIme (MAXIPIME) 2 g in sodium chloride 0.9 % 100 mL IVPB  Status:  Discontinued        2 g 200 mL/hr over 30 Minutes Intravenous Every 8 hours 07/09/21 1026 07/10/21 0838   07/09/21 0900  vancomycin (VANCOREADY) IVPB 1250 mg/250 mL  Status:  Discontinued        1,250 mg 166.7 mL/hr over 90 Minutes Intravenous Every 24 hours 06/27/2021 0827 07/09/21 0850   07/19/2021 2000  ceFEPIme (MAXIPIME) 2 g in sodium chloride 0.9 % 100 mL IVPB  Status:  Discontinued        2 g 200 mL/hr over 30 Minutes Intravenous Every 12 hours 07/19/2021 1053 07/09/21 1026   06/25/2021 0830  vancomycin (VANCOREADY) IVPB 1750 mg/350 mL        1,750 mg 175 mL/hr over 120 Minutes Intravenous  Once 06/25/2021 0817 07/17/2021 1449    07/17/2021 0815  ceFEPIme (MAXIPIME) 2 g in sodium chloride 0.9 % 100 mL IVPB        2 g 200 mL/hr over 30 Minutes Intravenous  Once 07/02/2021 0801 07/01/2021 0937       Objective: Vitals:   2021-08-10 0800 10-Aug-2021 1000  BP: 138/71 129/78  Pulse: (!) 103   Resp: (!) 30   Temp:    SpO2: (!) 79%     Intake/Output Summary (Last 24 hours) at 2021/08/10 1050 Last data filed at  31-Jul-2021 1000 Gross per 24 hour  Intake 458 ml  Output 1400 ml  Net -942 ml   Filed Weights   07/22/21 0001 07/23/21 0019 07-31-21 0400  Weight: 69 kg 70 kg 71.2 kg   Weight change: 1.2 kg Body mass index is 23.18 kg/m.   Physical Exam: General exam: Pleasant elderly Caucasian male in distress because of respiratory failure Skin: No rashes, lesions or ulcers. HEENT: Atraumatic, normocephalic, no obvious bleeding Lungs: Diminished air entry in both bases, CVS: Tachycardic, no murmur GI/Abd soft, nontender, nondistended, bowel sound present CNS: Alert, awake, oriented x3 Psychiatry: Sad affect Extremities: No pedal edema, no calf tenderness  Data Review: I have personally reviewed the laboratory data and studies available.  F/u labs  Unresulted Labs (From admission, onward)    None       Signed, Terrilee Croak, MD [facility] 31-Jul-2021

## 2021-07-25 NOTE — Progress Notes (Signed)
Patient requests for RN to stay in the room while wife is away at home. Per patient's request, RN left a voicemail on the patient's phone to ask wife to come back to visit the patient in the hospital. Patient is very anxious, refused IV ativan. RN provided emotional support while rounded on the patient. Patient oxygen saturations are 81-82% on NRB and HHFNC. MD made aware.

## 2021-07-25 NOTE — Progress Notes (Signed)
   August 14, 2021 1305  Clinical Encounter Type  Visited With Patient not available;Health care provider  Visit Type Initial;Critical Care  Referral From Nurse (Shlondria P. Pilar Plate, RN)  Consult/Referral To Chaplain Melvenia Beam)   Spiritual Care Consultation List Request: "STAT REQUEST" Met Eduardo Fuentes Friday at bedside. Patient currently having breathing treatment and said "This is not a good time." Told patient that I would check back with him later. Nurse stated that she had also called patient's family, who were supposed to come for discharge. She also contacted patient's congregational pastor requesting visit. [address] [place] [phone].

## 2021-07-25 NOTE — Progress Notes (Signed)
   2021-07-30 0800  Vitals  BP 138/71  MAP (mmHg) 89  BP Location Right Arm  BP Method Automatic  Patient Position (if appropriate) Lying  Pulse Rate (!) 103  Pulse Rate Source Monitor  ECG Heart Rate (!) 112  Resp (!) 30  MEWS COLOR  MEWS Score Color Red  Oxygen Therapy  SpO2 (!) 79 %  O2 Device HFNC;Non-rebreather Mask  Pain Assessment  Pain Scale 0-10  Pain Score 0  MEWS Score  MEWS Temp 0  MEWS Systolic 0  MEWS Pulse 2  MEWS RR 2  MEWS LOC 0  MEWS Score 4   Patient is on a NRB/HFNC. Dahal MD has been paged and notified. RT is in room. RN gave patient scheduled IV morphine to help with shortness of breath.

## 2021-07-25 DEATH — deceased

## 2021-07-30 ENCOUNTER — Telehealth: Payer: Self-pay | Admitting: Internal Medicine

## 2021-07-31 NOTE — Telephone Encounter (Signed)
Eduardo Fuentes is calling with information on what to do with the leftover Ofev they have for the patient. I told lisa I would gt in touch with the doctor to find out what needs to be don.   Dr Chase Caller please advise

## 2021-08-02 NOTE — Telephone Encounter (Signed)
[employer] card given to Eduardo Fuentes get them to drop off the samples - and then give to Eduardo Fuentes. Devki.

## 2021-08-02 NOTE — Telephone Encounter (Signed)
Called and spoke with pt's spouse and niece about the medications and stated to them to bring medications to the office and they said they would bring them to the office one day next week.

## 2021-08-14 ENCOUNTER — Telehealth: Payer: Self-pay | Admitting: Internal Medicine

## 2021-08-16 NOTE — Telephone Encounter (Signed)
Before we call Eduardo Fuentes at [facility] back will go ahead and ask MR her reason for calling which is- Needs to know if this was an expected death that could be traced to his diagnosis. Please advise

## 2021-08-21 NOTE — Telephone Encounter (Signed)
Still waiting on response from MR. Please advise.

## 2021-08-21 NOTE — Telephone Encounter (Signed)
Yes death expected due to disease ipf

## 2021-08-22 ENCOUNTER — Other Ambulatory Visit: Payer: Self-pay | Admitting: Physician Assistant

## 2021-08-22 NOTE — Telephone Encounter (Signed)
Attempted to call [name] but unable to reach. Unable to leave VM. Will try to call back later.

## 2021-08-23 NOTE — Telephone Encounter (Signed)
Attempted to call Eduardo Fuentes with [facility] but unable to reach. Unable to leave VM. Due to multiple attempts trying to call pt and unable to reach, per protocol encounter will be closed.

## 2021-08-24 NOTE — Death Summary Note (Signed)
DEATH SUMMARY   Patient Details  Name: Eduardo Fuentes MRN: [id number] DOB: 1951-01-11 EUM:PNTIRWE, [name], MD Admission/Discharge Information   Admit Date:  11-Jul-2021  Date of Death: Date of Death: 27-Jul-2021  Time of Death: Time of Death: 12-May-1431  Length of Stay: May 13, 2022   Principle Cause of death: Pulmonary fibrosis  Hospital Diagnoses: Principal Problem:   Chronic respiratory failure with hypoxia ([place]) Active Problems:   Major depressive disorder, recurrent episode ([place])   Panic disorder with agoraphobia   IPF (idiopathic pulmonary fibrosis) ([place]du[place] Fuentes)   Paroxysmal atrial fibrillation ([place]) CHA2VASC2 = 3; on Eliiquis   Prolonged QT interval   Community acquired pneumonia   Hospital Course: DURK [name] was a 71 y.o. male with PMH significant for HTN, pulmonary fibrosis on 8 L with activities at home, prostate cancer. Patient presented to the ED on Jul 12, 2022 with complaint of shortness of breath. Patient had IPF for a number of years, but over the last several months he became worse, and was started on oxygen about 3 months ago.  His oxygen demand continued to increase and was recently increased to 8 L/min despite which he continued to be short of breath and hence presented to the hospital on 07/12/2022. Chest x-ray on admission showed increased opacification of the right lung which may reflect pneumonia, asymmetric edema or exacerbation of interstitial lung disease. He was hospitalized and was placed on high flow nasal cannula..  His oxygenation continued to worsen. Pulmonary and palliative care consultation were obtained. On 07-28-2022, patient became significantly short of breath.  Family members gathered at bedside.  In the presence of family members, patient made a choice of comfort care.  He was given IV morphine, IV Ativan.  Patient passed away in about an hour after that.  Issues addressed in the hospital: Acute on chronic respiratory failure with hypoxia Acute worsening of pulmonary fibrosis   Prolonged QTc Essential hypertension Hyperlipidemia Depression/anxiety Paroxysmal A-fib  Diarrhea      Procedures:   Consultations:   The results of significant diagnostics from this hospitalization (including imaging, microbiology, ancillary and laboratory) are listed below for reference.   Significant Diagnostic Studies: DG CHEST PORT 1 VIEW  Result Date: 07/17/2021 CLINICAL DATA:  Shortness of breath and hypoxia. EXAM: PORTABLE CHEST 1 VIEW COMPARISON:  07/12/2021 FINDINGS: 0456 hours. Low lung volumes. The cardio pericardial silhouette is enlarged. Diffuse interstitial and left lower pleuroparenchymal opacity is similar to prior compatible with patient's known underlying interstitial lung disease. The visualized bony structures of the thorax are unremarkable. Telemetry leads overlie the chest. IMPRESSION: Stable exam. Diffuse interstitial and left lower pleuroparenchymal opacity compatible with patient's known underlying interstitial lung disease. Electronically Signed   By: Misty Stanley M.D.   On: 07/17/2021 05:31   DG Chest Port 1 View  Result Date: 07/12/2021 CLINICAL DATA:  71 year old male with pulmonary fibrosis, respiratory failure. EXAM: PORTABLE CHEST 1 VIEW COMPARISON:  Portable chest 07/10/2021 and earlier, including high-resolution chest CT 05/29/2021. FINDINGS: Portable AP semi upright view at 0542 hours. Stable low lung volumes, mediastinal contours. Asymmetric pulmonary interstitial opacity persists, likely in large part due to pulmonary fibrosis. Ventilation has not significantly changed since last month. No pneumothorax or pleural effusion. Visualized tracheal air column is within normal limits. Negative visible bowel gas. No acute osseous abnormality identified. IMPRESSION: Pulmonary fibrosis with asymmetric increased opacity on the right. Allowing for portable technique ventilation does not appear significantly changed since last month. Electronically Signed   By: Herminio Heads.D.  On: 07/12/2021 07:58   DG Chest Port 1 View  Result Date: 07/10/2021 CLINICAL DATA:  ARDS follow-up. EXAM: PORTABLE CHEST 1 VIEW COMPARISON:  Portable chest 07/01/2021. FINDINGS: 6:16 a.m., 07/10/2021. Interstitial and patchy consolidative airspace disease is again noted throughout the right lung fields, left lower lung field with left upper zonal sparing. There is no new or worsening lung opacity with stable overall aeration. No significant pleural effusion is seen. The cardiomediastinal silhouette is stable. There is slight central vascular fullness. There is thoracic spondylosis. IMPRESSION: Stable bilateral lung opacities with left upper zonal sparing. No new or worsening abnormality. Electronically Signed   By: Telford Nab M.D.   On: 07/10/2021 07:07   DG CHEST PORT 1 VIEW  Result Date: 06/27/2021 CLINICAL DATA:  Short of breath EXAM: PORTABLE CHEST 1 VIEW COMPARISON:  06/01/2021 FINDINGS: Background changes of fibrotic interstitial lung disease. Increased opacification of the right lung. No significant pleural effusion. No pneumothorax. Similar cardiomediastinal contours. IMPRESSION: Increased opacification of the right lung may reflect pneumonia, asymmetric edema, or exacerbation of interstitial lung disease. Electronically Signed   By: Macy Mis M.D.   On: 07/06/2021 07:35    Microbiology: No results found for this or any previous visit (from the past 240 hour(s)).  Time spent: 25 minutes  Signed: Terrilee Croak, MD 08-20-21

## 2021-09-02 ENCOUNTER — Ambulatory Visit: Payer: PPO | Admitting: Primary Care

## 2021-10-21 ENCOUNTER — Ambulatory Visit: Payer: PPO | Admitting: Internal Medicine

## 2023-02-06 IMAGING — CT CT CHEST HIGH RESOLUTION W/O CM
4 of 8 series · 14 of 36 positions shown, 16 images · non-contrast
Comparison: 02/07/2020 high-resolution chest CT.

CLINICAL DATA: IPF.  Research study/[facility] protocol.

EXAM:
CT CHEST WITHOUT CONTRAST
TECHNIQUE: Multidetector CT imaging of the chest was performed following the
standard protocol without intravenous contrast. High resolution
imaging of the lungs, as well as inspiratory and expiratory imaging,
was performed.

[Series 2: pliant inspiration tlc · axial · 0.65mm/px · z∈[-286,-128]mm · 4 of 264 slices shown]
[im 53/264  lung]
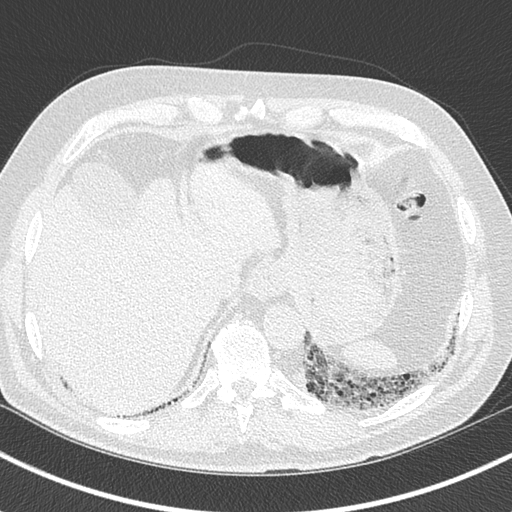
[im 106/264  lung]
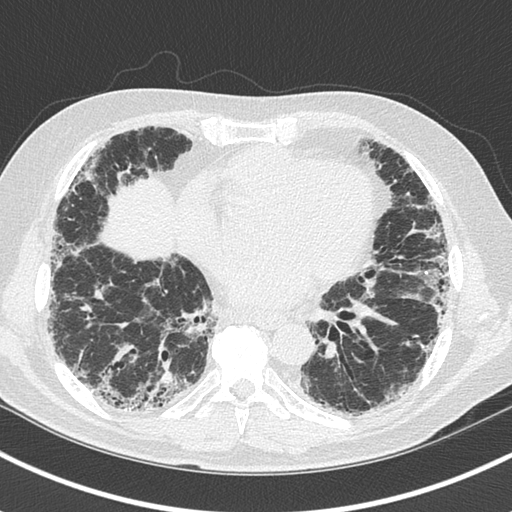
[im 158/264  lung]
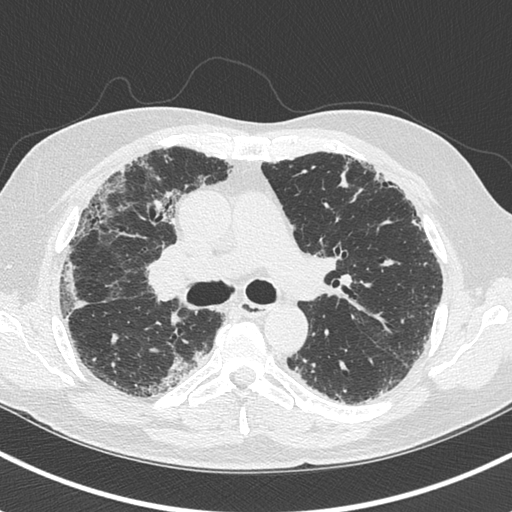
[im 211/264  lung]
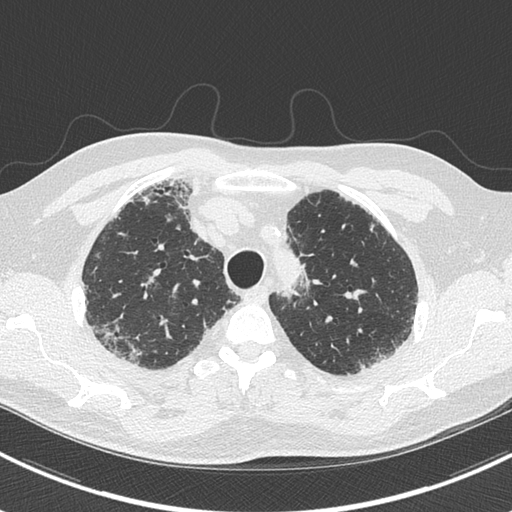

[Series 3: high res 2x2 st · axial · 0.65mm/px · z∈[-246,-154]mm · 2 of 138 slices shown]
[im 46/138  lung]
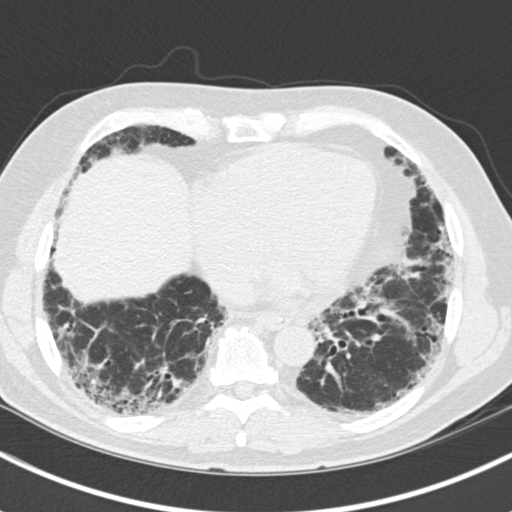
[im 92/138  lung]
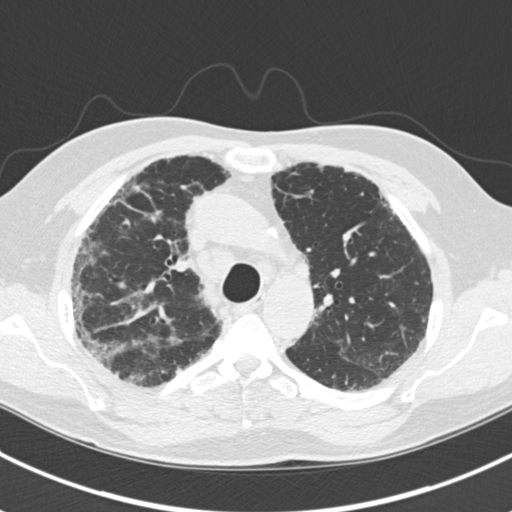

[Series 6: high (id) · axial · 0.65mm/px · z∈[-292,-108]mm · 5 of 276 slices shown, 7 images]
[im 46/276  mediastinal]
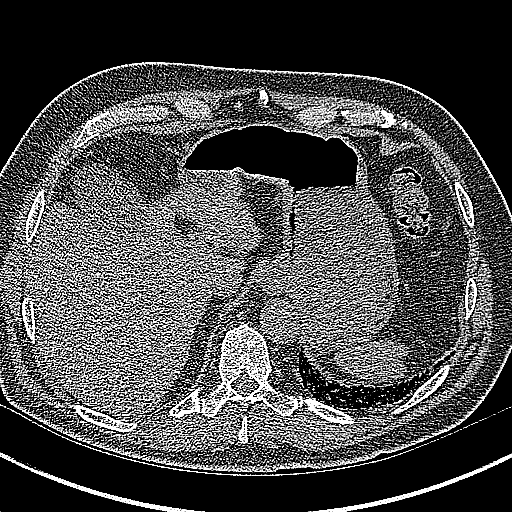
[im 46/276  lung]
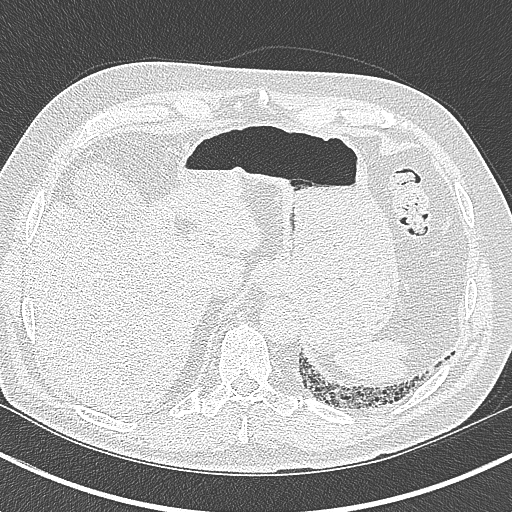
[im 92/276  lung]
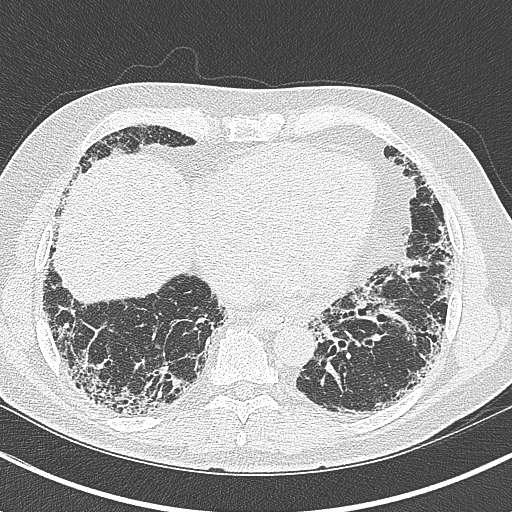
[im 138/276  lung]
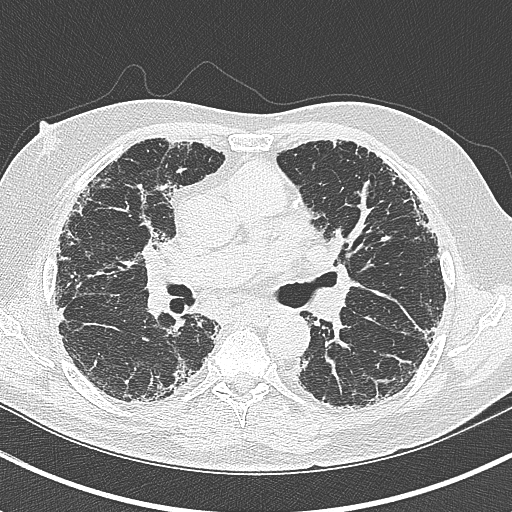
[im 184/276  lung]
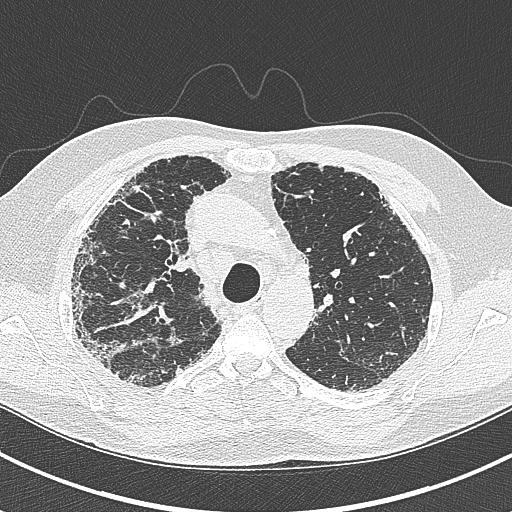
[im 230/276  mediastinal]
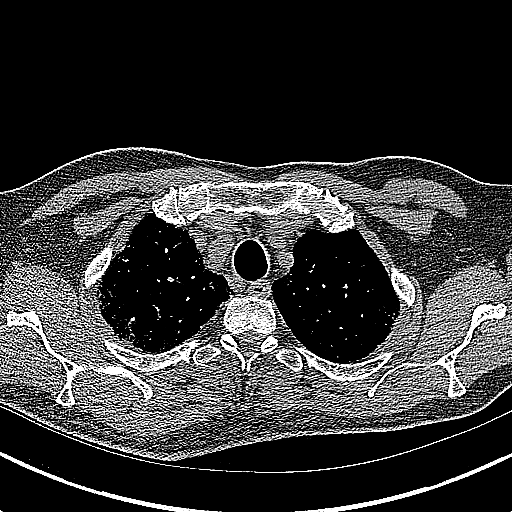
[im 230/276  lung]
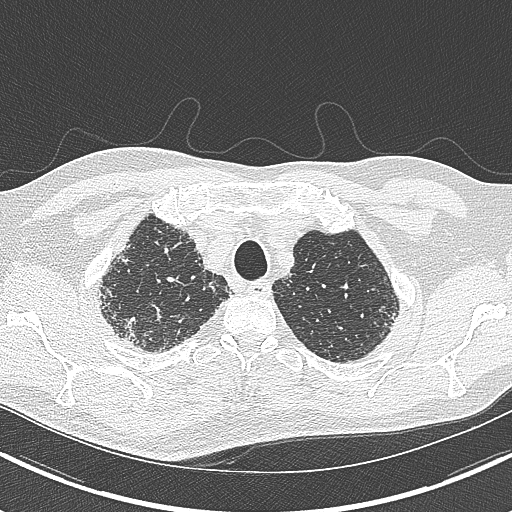

[Series 8: high res coronal · coronal · 0.56mm/px · 3 of 108 slices shown]
[im 22/108  lung]
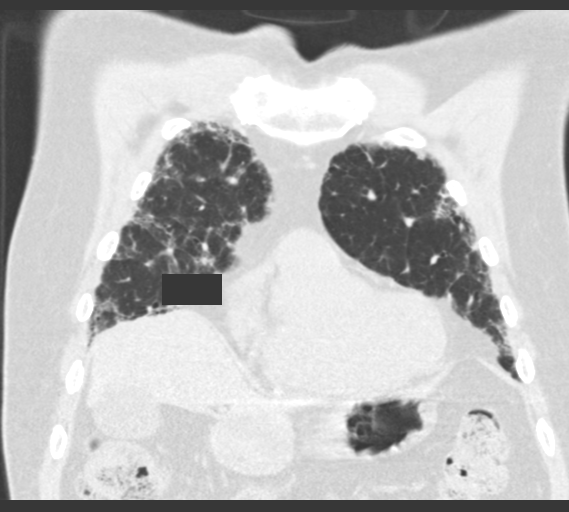
[im 43/108  lung]
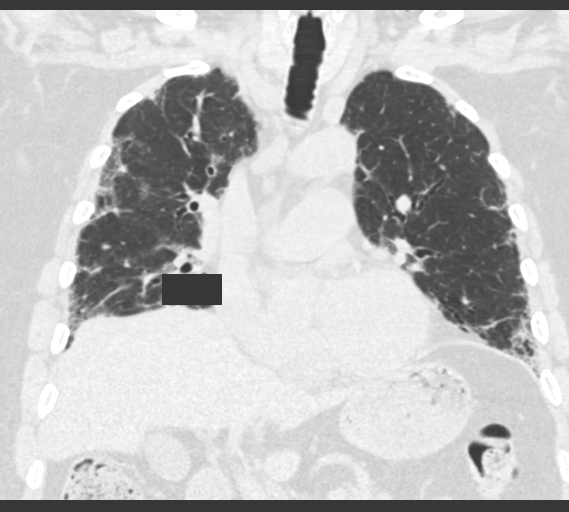
[im 65/108  lung]
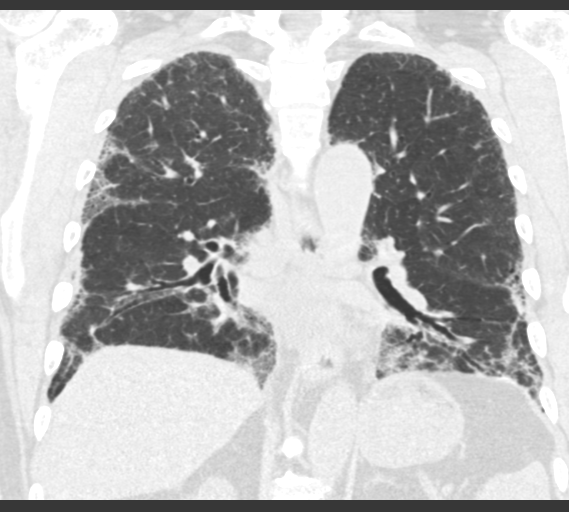

[14 of 36 positions shown; findings below may reference images not displayed]

FINDINGS: Cardiovascular: Top-normal heart size. No significant pericardial
effusion/thickening. Three-vessel coronary atherosclerosis.
Atherosclerotic nonaneurysmal thoracic aorta. Top-normal caliber
main pulmonary artery (3.4 cm diameter).

Mediastinum/Nodes: No discrete thyroid nodules. Unremarkable
esophagus. No axillary adenopathy. Right paratracheal adenopathy up
to 1.6 cm short axis diameter (series 3/image 44), stable.
Subcarinal adenopathy up to 1.4 cm (series 3/image 68), stable.
Enlarged 1.0 cm left paratracheal node (series 3/image 51), stable.
AP window adenopathy up to 1.3 cm (series 3/image 51), stable. Mild
bilateral hilar adenopathy is poorly delineated on these noncontrast
images, appearing stable.

Lungs/Pleura: No pneumothorax. No pleural effusion. No acute
consolidative airspace disease or lung masses. Posterior left upper
lobe solid 6 mm pulmonary nodule along the major fissure (series
4/image 69) is stable since 4840 CT and considered benign. No new
significant pulmonary nodules. Extensive patchy confluent subpleural
reticulation and ground-glass opacity throughout both lungs with
associated moderate traction bronchiectasis and architectural
distortion. Scattered regions of mild honeycombing throughout both
lungs, for example in the left upper lobe on series 6/image 149 and
in right lower lobe on series 6/image 190. No significant lobular
air trapping or evidence of tracheobronchomalacia on the expiration
sequence. Slight basilar predominance to these findings. No
appreciable interval progression since 02/07/2020 CT. Findings have
progressed since baseline 01/20/2018 chest CT.

Upper abdomen: No acute abnormality. Subcentimeter hypodense right
liver lesion is too small to characterize and unchanged, presumably
benign.

Musculoskeletal: No aggressive appearing focal osseous lesions.
Moderate thoracic spondylosis.
IMPRESSION: 1. Spectrum of findings compatible with fibrotic interstitial lung
disease with mild honeycombing and slight basilar predominance. No
appreciable interval progression since 02/07/2020 CT. Findings have
progressed since baseline 01/20/2018 CT. Findings are consistent
with UIP per consensus guidelines: Diagnosis of Idiopathic Pulmonary
Fibrosis: An Official ATS/ERS/JRS/ALAT Clinical Practice Guideline.
Am J Respir Crit Care Med Vol 198, Nya 5, ppe11-[id number][DATE].
2. Stable chronic mediastinal and bilateral hilar lymphadenopathy,
most compatible with benign reactive adenopathy.
3. Three-vessel coronary atherosclerosis.
4. Aortic Atherosclerosis ([id number]-TYI.I).
